# Patient Record
Sex: Female | Born: 1965 | Race: Black or African American | Hispanic: No | Smoking: Current every day smoker
Health system: Southern US, Community
[De-identification: ages and names within clinical notes are randomized; demographics above are authoritative.]

## PROBLEM LIST (undated history)

## (undated) VITALS — BP 120/80 | HR 77 | Temp 98.9°F | Resp 18

## (undated) DIAGNOSIS — K219 Gastro-esophageal reflux disease without esophagitis: Secondary | ICD-10-CM

## (undated) DIAGNOSIS — F3181 Bipolar II disorder: Secondary | ICD-10-CM

## (undated) DIAGNOSIS — D259 Leiomyoma of uterus, unspecified: Secondary | ICD-10-CM

## (undated) DIAGNOSIS — K5792 Diverticulitis of intestine, part unspecified, without perforation or abscess without bleeding: Secondary | ICD-10-CM

## (undated) DIAGNOSIS — E119 Type 2 diabetes mellitus without complications: Secondary | ICD-10-CM

## (undated) DIAGNOSIS — K5790 Diverticulosis of intestine, part unspecified, without perforation or abscess without bleeding: Secondary | ICD-10-CM

## (undated) DIAGNOSIS — F1910 Other psychoactive substance abuse, uncomplicated: Secondary | ICD-10-CM

## (undated) DIAGNOSIS — M5430 Sciatica, unspecified side: Secondary | ICD-10-CM

## (undated) HISTORY — PX: CHOLECYSTECTOMY: SHX55

## (undated) HISTORY — DX: Gastro-esophageal reflux disease without esophagitis: K21.9

## (undated) HISTORY — DX: Sciatica, unspecified side: M54.30

## (undated) HISTORY — PX: LUMBAR SPINE SURGERY: SHX701

## (undated) HISTORY — PX: BACK SURGERY: SHX140

---

## 2003-11-26 ENCOUNTER — Emergency Department: Payer: Self-pay | Admitting: Emergency Medicine

## 2003-11-26 ENCOUNTER — Other Ambulatory Visit: Payer: Self-pay

## 2004-05-10 ENCOUNTER — Emergency Department: Payer: Self-pay | Admitting: Emergency Medicine

## 2004-08-13 ENCOUNTER — Emergency Department: Payer: Self-pay | Admitting: Emergency Medicine

## 2005-02-14 ENCOUNTER — Emergency Department: Payer: Self-pay | Admitting: Emergency Medicine

## 2005-10-24 ENCOUNTER — Emergency Department: Payer: Self-pay | Admitting: Emergency Medicine

## 2006-01-14 ENCOUNTER — Emergency Department: Payer: Self-pay

## 2006-08-16 ENCOUNTER — Emergency Department: Payer: Self-pay | Admitting: Emergency Medicine

## 2007-05-25 ENCOUNTER — Emergency Department: Payer: Self-pay | Admitting: Emergency Medicine

## 2007-07-03 ENCOUNTER — Emergency Department: Payer: Self-pay | Admitting: Emergency Medicine

## 2007-09-20 ENCOUNTER — Other Ambulatory Visit: Payer: Self-pay

## 2007-09-20 ENCOUNTER — Emergency Department: Payer: Self-pay | Admitting: Emergency Medicine

## 2007-12-07 ENCOUNTER — Emergency Department: Payer: Self-pay | Admitting: Emergency Medicine

## 2008-03-05 ENCOUNTER — Emergency Department: Payer: Self-pay | Admitting: Emergency Medicine

## 2008-04-27 ENCOUNTER — Emergency Department: Payer: Self-pay | Admitting: Emergency Medicine

## 2008-07-12 ENCOUNTER — Emergency Department: Payer: Self-pay | Admitting: Internal Medicine

## 2008-12-20 ENCOUNTER — Inpatient Hospital Stay: Payer: Self-pay | Admitting: Internal Medicine

## 2009-05-01 ENCOUNTER — Emergency Department: Payer: Self-pay | Admitting: Emergency Medicine

## 2009-07-27 ENCOUNTER — Emergency Department: Payer: Self-pay | Admitting: Unknown Physician Specialty

## 2009-11-08 ENCOUNTER — Emergency Department: Payer: Self-pay | Admitting: Emergency Medicine

## 2009-12-10 ENCOUNTER — Emergency Department: Payer: Self-pay | Admitting: Emergency Medicine

## 2010-02-22 ENCOUNTER — Emergency Department: Payer: Self-pay | Admitting: Emergency Medicine

## 2010-02-24 ENCOUNTER — Emergency Department: Payer: Self-pay | Admitting: Emergency Medicine

## 2010-06-23 ENCOUNTER — Emergency Department: Payer: Self-pay | Admitting: Emergency Medicine

## 2010-07-23 ENCOUNTER — Emergency Department: Payer: Self-pay | Admitting: Emergency Medicine

## 2010-07-24 ENCOUNTER — Emergency Department: Payer: Self-pay | Admitting: Emergency Medicine

## 2010-07-25 ENCOUNTER — Emergency Department: Payer: Self-pay | Admitting: Emergency Medicine

## 2010-09-10 ENCOUNTER — Emergency Department: Payer: Self-pay | Admitting: Internal Medicine

## 2010-10-05 ENCOUNTER — Emergency Department: Payer: Self-pay | Admitting: Emergency Medicine

## 2011-04-21 ENCOUNTER — Emergency Department: Payer: Self-pay | Admitting: Emergency Medicine

## 2011-06-23 ENCOUNTER — Emergency Department: Payer: Self-pay | Admitting: Emergency Medicine

## 2011-06-23 LAB — WET PREP, GENITAL

## 2011-06-23 LAB — BASIC METABOLIC PANEL
Anion Gap: 8 (ref 7–16)
Chloride: 110 mmol/L — ABNORMAL HIGH (ref 98–107)
Co2: 22 mmol/L (ref 21–32)
Creatinine: 0.92 mg/dL (ref 0.60–1.30)
EGFR (African American): >60
EGFR (Non-African Amer.): >60
Glucose: 101 mg/dL — ABNORMAL HIGH (ref 65–99)
Potassium: 3.9 mmol/L (ref 3.5–5.1)

## 2011-06-23 LAB — URINALYSIS, COMPLETE
Leukocyte Esterase: NEGATIVE
Nitrite: NEGATIVE
Ph: 5 (ref 4.5–8.0)
Protein: NEGATIVE
RBC,UR: 2 /HPF (ref 0–5)
Specific Gravity: 1.028 (ref 1.003–1.030)

## 2011-06-23 LAB — PREGNANCY, URINE: Pregnancy Test, Urine: NEGATIVE m[IU]/mL

## 2011-06-23 LAB — CBC
HGB: 12.8 g/dL (ref 12.0–16.0)
MCHC: 33.2 g/dL (ref 32.0–36.0)
MCV: 87 fL (ref 80–100)
Platelet: 241 10*3/uL (ref 150–440)
RDW: 14.3 % (ref 11.5–14.5)

## 2011-10-09 ENCOUNTER — Observation Stay: Payer: Self-pay | Admitting: Internal Medicine

## 2011-10-09 LAB — CK TOTAL AND CKMB (NOT AT ARMC)
CK, Total: 212 U/L (ref 21–215)
CK-MB: 2 ng/mL (ref 0.5–3.6)
CK-MB: 1.8 ng/mL (ref 0.5–3.6)

## 2011-10-09 LAB — CBC
HCT: 38.1 % (ref 35.0–47.0)
HGB: 12.9 g/dL (ref 12.0–16.0)
MCV: 87 fL (ref 80–100)
Platelet: 219 10*3/uL (ref 150–440)
RBC: 4.38 10*6/uL (ref 3.80–5.20)
WBC: 10.8 10*3/uL (ref 3.6–11.0)

## 2011-10-09 LAB — COMPREHENSIVE METABOLIC PANEL
Albumin: 3.1 g/dL — ABNORMAL LOW (ref 3.4–5.0)
Alkaline Phosphatase: 89 U/L (ref 50–136)
BUN: 14 mg/dL (ref 7–18)
Bilirubin,Total: 0.2 mg/dL (ref 0.2–1.0)
Calcium, Total: 8.2 mg/dL — ABNORMAL LOW (ref 8.5–10.1)
Co2: 23 mmol/L (ref 21–32)
EGFR (Non-African Amer.): >60
Glucose: 193 mg/dL — ABNORMAL HIGH (ref 65–99)
Osmolality: 281 (ref 275–301)
Potassium: 3.8 mmol/L (ref 3.5–5.1)
Sodium: 138 mmol/L (ref 136–145)
Total Protein: 6.9 g/dL (ref 6.4–8.2)

## 2011-10-09 LAB — PROTIME-INR
INR: 0.9
Prothrombin Time: 13 secs (ref 11.5–14.7)

## 2011-10-09 LAB — LIPID PANEL
HDL Cholesterol: 46 mg/dL (ref 40–60)
Triglycerides: 248 mg/dL — ABNORMAL HIGH (ref 0–200)

## 2011-10-09 LAB — TSH: Thyroid Stimulating Horm: 1.36 u[IU]/mL

## 2011-10-09 LAB — HEMOGLOBIN A1C: Hemoglobin A1C: 6.9 % — ABNORMAL HIGH (ref 4.2–6.3)

## 2011-10-10 DIAGNOSIS — R079 Chest pain, unspecified: Secondary | ICD-10-CM

## 2011-10-10 LAB — CK TOTAL AND CKMB (NOT AT ARMC): CK, Total: 170 U/L (ref 21–215)

## 2011-11-14 ENCOUNTER — Emergency Department: Payer: Self-pay | Admitting: Emergency Medicine

## 2011-11-14 LAB — BASIC METABOLIC PANEL
Anion Gap: 9 (ref 7–16)
BUN: 14 mg/dL (ref 7–18)
Chloride: 111 mmol/L — ABNORMAL HIGH (ref 98–107)
Creatinine: 0.97 mg/dL (ref 0.60–1.30)
Glucose: 93 mg/dL (ref 65–99)
Osmolality: 285 (ref 275–301)
Sodium: 143 mmol/L (ref 136–145)

## 2011-11-14 LAB — CBC
HCT: 38.8 % (ref 35.0–47.0)
HGB: 13.5 g/dL (ref 12.0–16.0)
MCH: 30.1 pg (ref 26.0–34.0)
MCV: 86 fL (ref 80–100)
RBC: 4.5 10*6/uL (ref 3.80–5.20)
RDW: 14.2 % (ref 11.5–14.5)

## 2011-11-14 LAB — CK TOTAL AND CKMB (NOT AT ARMC)
CK, Total: 238 U/L — ABNORMAL HIGH (ref 21–215)
CK-MB: 1.7 ng/mL (ref 0.5–3.6)

## 2011-11-20 ENCOUNTER — Emergency Department: Payer: Self-pay | Admitting: Emergency Medicine

## 2011-11-20 LAB — URINALYSIS, COMPLETE
Bacteria: NONE SEEN
Bilirubin,UR: NEGATIVE
Glucose,UR: NEGATIVE mg/dL (ref 0–75)
Ketone: NEGATIVE
Ph: 7 (ref 4.5–8.0)
Protein: NEGATIVE
RBC,UR: <1 /HPF (ref 0–5)
Specific Gravity: 1.008 (ref 1.003–1.030)
Squamous Epithelial: 2
WBC UR: <1 /HPF (ref 0–5)

## 2011-12-18 ENCOUNTER — Emergency Department: Payer: Self-pay | Admitting: Unknown Physician Specialty

## 2011-12-18 LAB — COMPREHENSIVE METABOLIC PANEL
Albumin: 3.4 g/dL (ref 3.4–5.0)
Chloride: 108 mmol/L — ABNORMAL HIGH (ref 98–107)
Co2: 22 mmol/L (ref 21–32)
EGFR (African American): >60
Osmolality: 279 (ref 275–301)
SGOT(AST): 12 U/L — ABNORMAL LOW (ref 15–37)
SGPT (ALT): 20 U/L (ref 12–78)

## 2011-12-18 LAB — CBC
HCT: 40.4 % (ref 35.0–47.0)
MCHC: 33 g/dL (ref 32.0–36.0)
Platelet: 267 10*3/uL (ref 150–440)
RDW: 14.6 % — ABNORMAL HIGH (ref 11.5–14.5)
WBC: 13.8 10*3/uL — ABNORMAL HIGH (ref 3.6–11.0)

## 2011-12-18 LAB — MAGNESIUM: Magnesium: 2.2 mg/dL

## 2012-01-02 ENCOUNTER — Emergency Department: Payer: Self-pay | Admitting: Emergency Medicine

## 2012-01-02 LAB — COMPREHENSIVE METABOLIC PANEL
Albumin: 3.3 g/dL — ABNORMAL LOW (ref 3.4–5.0)
BUN: 11 mg/dL (ref 7–18)
Bilirubin,Total: 0.3 mg/dL (ref 0.2–1.0)
Creatinine: 0.74 mg/dL (ref 0.60–1.30)
Glucose: 135 mg/dL — ABNORMAL HIGH (ref 65–99)
Osmolality: 277 (ref 275–301)
Potassium: 3.8 mmol/L (ref 3.5–5.1)
SGOT(AST): 12 U/L — ABNORMAL LOW (ref 15–37)
Sodium: 138 mmol/L (ref 136–145)
Total Protein: 7.4 g/dL (ref 6.4–8.2)

## 2012-01-02 LAB — CK TOTAL AND CKMB (NOT AT ARMC)
CK, Total: 211 U/L (ref 21–215)
CK-MB: 1.7 ng/mL (ref 0.5–3.6)

## 2012-01-02 LAB — URINALYSIS, COMPLETE
Bilirubin,UR: NEGATIVE
Glucose,UR: NEGATIVE mg/dL (ref 0–75)
Ketone: NEGATIVE
Ph: 5 (ref 4.5–8.0)
Protein: NEGATIVE
RBC,UR: 1 /HPF (ref 0–5)

## 2012-01-02 LAB — CBC
HCT: 38 % (ref 35.0–47.0)
MCHC: 33.5 g/dL (ref 32.0–36.0)
MCV: 86 fL (ref 80–100)
RDW: 14.7 % — ABNORMAL HIGH (ref 11.5–14.5)

## 2012-04-19 ENCOUNTER — Emergency Department: Payer: Self-pay | Admitting: Emergency Medicine

## 2012-04-19 LAB — CBC
HCT: 36.1 % (ref 35.0–47.0)
MCHC: 32.9 g/dL (ref 32.0–36.0)
Platelet: 253 10*3/uL (ref 150–440)
RBC: 4.15 10*6/uL (ref 3.80–5.20)
WBC: 11.8 10*3/uL — ABNORMAL HIGH (ref 3.6–11.0)

## 2012-04-19 LAB — WET PREP, GENITAL

## 2012-04-19 LAB — COMPREHENSIVE METABOLIC PANEL
Alkaline Phosphatase: 82 U/L (ref 50–136)
Anion Gap: 7 (ref 7–16)
Bilirubin,Total: 0.1 mg/dL — ABNORMAL LOW (ref 0.2–1.0)
Calcium, Total: 8.2 mg/dL — ABNORMAL LOW (ref 8.5–10.1)
Chloride: 110 mmol/L — ABNORMAL HIGH (ref 98–107)
EGFR (African American): >60
Glucose: 117 mg/dL — ABNORMAL HIGH (ref 65–99)
Osmolality: 280 (ref 275–301)
SGOT(AST): 21 U/L (ref 15–37)
SGPT (ALT): 21 U/L (ref 12–78)
Sodium: 139 mmol/L (ref 136–145)
Total Protein: 6.6 g/dL (ref 6.4–8.2)

## 2012-04-19 LAB — URINALYSIS, COMPLETE
Bacteria: NONE SEEN
Bilirubin,UR: NEGATIVE
Ketone: NEGATIVE
Nitrite: NEGATIVE
Ph: 5 (ref 4.5–8.0)
Protein: NEGATIVE
RBC,UR: 1 /HPF (ref 0–5)
Specific Gravity: 1.023 (ref 1.003–1.030)
Squamous Epithelial: 1

## 2012-06-04 ENCOUNTER — Emergency Department: Payer: Self-pay | Admitting: Emergency Medicine

## 2012-06-10 ENCOUNTER — Emergency Department: Payer: Self-pay | Admitting: Emergency Medicine

## 2012-06-10 LAB — COMPREHENSIVE METABOLIC PANEL
Albumin: 3.2 g/dL — ABNORMAL LOW (ref 3.4–5.0)
Alkaline Phosphatase: 97 U/L (ref 50–136)
Anion Gap: 4 — ABNORMAL LOW (ref 7–16)
BUN: 15 mg/dL (ref 7–18)
Bilirubin,Total: 0.1 mg/dL — ABNORMAL LOW (ref 0.2–1.0)
Calcium, Total: 8.8 mg/dL (ref 8.5–10.1)
Creatinine: 0.89 mg/dL (ref 0.60–1.30)
EGFR (Non-African Amer.): >60
Osmolality: 279 (ref 275–301)
SGOT(AST): 21 U/L (ref 15–37)
SGPT (ALT): 21 U/L (ref 12–78)

## 2012-06-10 LAB — URINALYSIS, COMPLETE
Bilirubin,UR: NEGATIVE
Glucose,UR: NEGATIVE mg/dL (ref 0–75)
Ketone: NEGATIVE
Nitrite: NEGATIVE
RBC,UR: 3 /HPF (ref 0–5)
Squamous Epithelial: 9
WBC UR: 3 /HPF (ref 0–5)

## 2012-06-10 LAB — CBC
HGB: 12.9 g/dL (ref 12.0–16.0)
MCH: 28.8 pg (ref 26.0–34.0)
MCHC: 33.9 g/dL (ref 32.0–36.0)
Platelet: 251 10*3/uL (ref 150–440)

## 2012-06-10 LAB — LIPASE, BLOOD: Lipase: 138 U/L (ref 73–393)

## 2012-07-18 ENCOUNTER — Emergency Department: Payer: Self-pay | Admitting: Emergency Medicine

## 2013-02-13 ENCOUNTER — Emergency Department: Payer: Self-pay | Admitting: Internal Medicine

## 2013-02-13 LAB — RAPID INFLUENZA A&B ANTIGENS (ARMC ONLY)

## 2013-02-27 ENCOUNTER — Emergency Department: Payer: Self-pay | Admitting: Emergency Medicine

## 2013-03-04 ENCOUNTER — Emergency Department: Payer: Self-pay | Admitting: Emergency Medicine

## 2013-05-18 ENCOUNTER — Emergency Department: Payer: Self-pay | Admitting: Emergency Medicine

## 2013-07-18 ENCOUNTER — Emergency Department: Payer: Self-pay | Admitting: Emergency Medicine

## 2013-07-18 LAB — CBC
HCT: 45.7 % (ref 35.0–47.0)
HGB: 14.6 g/dL (ref 12.0–16.0)
MCH: 28.5 pg (ref 26.0–34.0)
MCHC: 31.9 g/dL — ABNORMAL LOW (ref 32.0–36.0)
MCV: 89 fL (ref 80–100)
Platelet: 217 10*3/uL (ref 150–440)
RBC: 5.12 10*6/uL (ref 3.80–5.20)
RDW: 13.8 % (ref 11.5–14.5)
WBC: 12 10*3/uL — ABNORMAL HIGH (ref 3.6–11.0)

## 2013-07-18 LAB — URINALYSIS, COMPLETE
BILIRUBIN, UR: NEGATIVE
KETONE: NEGATIVE
Leukocyte Esterase: NEGATIVE
Nitrite: NEGATIVE
PH: 5 (ref 4.5–8.0)
PROTEIN: NEGATIVE
Specific Gravity: 1.033 (ref 1.003–1.030)

## 2013-07-18 LAB — COMPREHENSIVE METABOLIC PANEL
ALBUMIN: 3.3 g/dL — AB (ref 3.4–5.0)
ALK PHOS: 165 U/L — AB
ANION GAP: 7 (ref 7–16)
AST: 27 U/L (ref 15–37)
BILIRUBIN TOTAL: 0.3 mg/dL (ref 0.2–1.0)
BUN: 12 mg/dL (ref 7–18)
CALCIUM: 9.9 mg/dL (ref 8.5–10.1)
CO2: 22 mmol/L (ref 21–32)
Chloride: 100 mmol/L (ref 98–107)
Creatinine: 0.68 mg/dL (ref 0.60–1.30)
EGFR (Non-African Amer.): >60
GLUCOSE: 454 mg/dL — AB (ref 65–99)
Osmolality: 278 (ref 275–301)
POTASSIUM: 4.7 mmol/L (ref 3.5–5.1)
SGPT (ALT): 33 U/L (ref 12–78)
Sodium: 129 mmol/L — ABNORMAL LOW (ref 136–145)
Total Protein: 7.3 g/dL (ref 6.4–8.2)

## 2013-08-30 ENCOUNTER — Emergency Department: Payer: Self-pay | Admitting: Emergency Medicine

## 2013-10-21 ENCOUNTER — Emergency Department: Payer: Self-pay | Admitting: Internal Medicine

## 2013-10-21 LAB — WET PREP, GENITAL

## 2013-10-21 LAB — URINALYSIS, COMPLETE
BACTERIA: NONE SEEN
Bilirubin,UR: NEGATIVE
Glucose,UR: 50 mg/dL (ref 0–75)
Hyaline Cast: 1
KETONE: NEGATIVE
Nitrite: NEGATIVE
PH: 5 (ref 4.5–8.0)
PROTEIN: NEGATIVE
RBC,UR: 26 /HPF (ref 0–5)
Specific Gravity: 1.023 (ref 1.003–1.030)
Squamous Epithelial: 1

## 2013-10-21 LAB — GC/CHLAMYDIA PROBE AMP

## 2014-02-17 ENCOUNTER — Emergency Department: Payer: Self-pay | Admitting: Emergency Medicine

## 2014-02-17 LAB — CBC
HCT: 39.4 % (ref 35.0–47.0)
HGB: 13.2 g/dL (ref 12.0–16.0)
MCH: 29.7 pg (ref 26.0–34.0)
MCHC: 33.6 g/dL (ref 32.0–36.0)
MCV: 88 fL (ref 80–100)
Platelet: 232 10*3/uL (ref 150–440)
RBC: 4.46 10*6/uL (ref 3.80–5.20)
RDW: 13.4 % (ref 11.5–14.5)
WBC: 11.9 10*3/uL — ABNORMAL HIGH (ref 3.6–11.0)

## 2014-02-17 LAB — BASIC METABOLIC PANEL
ANION GAP: 9 (ref 7–16)
BUN: 13 mg/dL (ref 7–18)
CALCIUM: 8.3 mg/dL — AB (ref 8.5–10.1)
CHLORIDE: 107 mmol/L (ref 98–107)
CO2: 24 mmol/L (ref 21–32)
CREATININE: 0.96 mg/dL (ref 0.60–1.30)
EGFR (African American): >60
EGFR (Non-African Amer.): >60
Glucose: 315 mg/dL — ABNORMAL HIGH (ref 65–99)
Osmolality: 292 (ref 275–301)
Potassium: 3.5 mmol/L (ref 3.5–5.1)
Sodium: 140 mmol/L (ref 136–145)

## 2014-02-17 LAB — TROPONIN I

## 2014-02-18 LAB — TROPONIN I: Troponin-I: <0.02 ng/mL

## 2014-02-18 LAB — D-DIMER(ARMC): D-Dimer: 238 ng/ml

## 2014-05-30 NOTE — Consult Note (Signed)
Brief Consult Note: Diagnosis: bipolar disorder II.   Patient was seen by consultant.   Consult note dictated.   Recommend further assessment or treatment.   Orders entered.   Comments: Psychiatry: Patient seen. Chart reviewed. Patient recently more emotional and tearful. Wants to restart tegretol 150mg  bid and trazodone 50mg  qhs - previously helpful. Orders done. Patient should be referred back to Dr Kasandra Knudsen for treatment post discharge. Will follow up.  Electronic Signatures: Gonzella Lex (MD)  (Signed 29-Aug-13 15:50)  Authored: Brief Consult Note   Last Updated: 29-Aug-13 15:50 by Gonzella Lex (MD)

## 2014-05-30 NOTE — Consult Note (Signed)
Details:    - Psychiatry: Patient seen. She is feeling better. Mood better and smiling. Tolerating meds well. I explained that the tegretol 300mg  XR shouild be the equivilent of 150mg  bid plain and our pharmacy doesn't have 150mg .   I suggest to her to either followup with Dr Kasandra Knudsen, her prior psychiatrist, or with [facility] services. I gave her their phone number at [phone]. She agrees to seek outpatient followup.   Electronic Signatures: Gonzella Lex (MD)  (Signed 30-Aug-13 17:08)  Authored: Details   Last Updated: 30-Aug-13 17:08 by Gonzella Lex (MD)

## 2014-05-30 NOTE — Discharge Summary (Signed)
PATIENT NAME:  Julie Lambert, Julie Lambert MR#:  [id number] DATE OF BIRTH:  January 09, 1966  DATE OF ADMISSION:  10/09/2011 DATE OF DISCHARGE:  10/10/2011  DISCHARGE DIAGNOSES:  1. Atypical chest pain, likely noncardiac, possibly related to anxiety and depression. 2. Arm pain felt to be musculoskeletal.  3. Hyperlipidemia.  4. Possible new onset diabetes. 5. Smoking. 6. Bipolar disorder/depression.   CONSULTANT: Alethia Berthold, MD - Psychiatry.  RESULTS: Inpatient stress test showed no evidence of ischemia.   Chest x-ray showed no acute abnormality.   Shoulder x-ray, left, showed no acute abnormality.   CBC normal. Glucose 193. Hemoglobin A1c 6.9. VLDL 50, LDL 79, triglycerides 248, and HDL 46.   HOSPITAL COURSE: The patient is a 49 year old female with past medical history of anxiety and depression who has not been taking her medications due to financial reasons. She presented with very atypical chest pain and left shoulder pain. She was admitted to the hospital and ruled out with three sets of negative cardiac enzymes. Her TSH was normal. She underwent an inpatient stress test which showed no evidence of any ischemia. Her arm pain was felt to be musculoskeletal. Chest and shoulder x-rays were negative. Fasting lipid profile was checked and the patient has elevated LDL and triglycerides. She has been advised about diet and lifestyle modifications. We will defer initiation of therapy to her PCP. She had an elevated serum glucose close to 200 therefore Hemoglobin A1c was checked and was found to be 6.9. The patient is at high risk. The patient likely has new onset diabetes and is obese. Therefore, she is being started on metformin. She will receive inpatient diabetic teaching prior to discharge. She has been counseled about smoking cessation. A psychiatric consultation with Dr. Alethia Berthold was obtained and the patient has been restarted on Trazodone and Tegretol. She has been given a prescription for all her  medications. She is being discharged home in a stable condition.   TIME SPENT: 45 minutes.  ____________________________ Cherre Huger, MD sp:slb D: 10/10/2011 15:58:30 ET T: 10/11/2011 12:01:19 ET JOB#: [id number]  cc: Cherre Huger, MD, <Dictator> Cherre Huger MD ELECTRONICALLY SIGNED 10/18/2011 6:53

## 2014-05-30 NOTE — H&P (Signed)
PATIENT NAME:  Julie Lambert, BIERNAT MR#:  [id number] DATE OF BIRTH:  March 20, 1965  DATE OF ADMISSION:  10/09/2011  PRIMARY CARE PHYSICIAN: None.   CHIEF COMPLAINT: Extreme chest pain and left arm pain.   HISTORY OF PRESENT ILLNESS: This is a 49 year old female with a history of cocaine abuse and tobacco abuse who presents with the above complaint. The patient says for the past one year she has had on and off left arm pain. She denies any trauma or strenuous activity to the left arm, however, over the past few weeks it's just gotten a lot worse where she's unable to move her arm. On further questioning she also says that this morning at 3 a.m. she developed sudden onset of extreme chest pain, 10 out of 10, without any radiation. No shortness of breath, diaphoresis, nausea, or vomiting. It lasted about 30 minutes. She has had on and off chest pain for the past two weeks. She said it is aggravated when she is angry or stressed out and relieved when she is calm. She also admits to being depressed over several months. She has been off of her bipolar affective disorder medications over one year. She denies any suicidal or homicidal ideations.    REVIEW OF SYSTEMS: CONSTITUTIONAL: No fever. Positive fatigue and weakness. Positive arm pain and chest pain. No weight loss or weight gain. EYES: No blurred or double vision, glaucoma or cataracts. ENT: No hearing loss, ear pain, seasonal allergies, or postnasal drip. RESPIRATORY: No cough, wheezing, hemoptysis, dyspnea, or COPD. CARDIOVASCULAR: Chest pain as mentioned above. No palpitations, orthopnea, syncope, arrhythmia, or dyspnea on exertion. GI: Denies any nausea, vomiting, diarrhea, abdominal pain, melena, or ulcers. GU: No dysuria or hematuria. ENDOCRINE: No polyuria or polydipsia. HEME/LYMPH: No anemia or easy bruising. SKIN: No rash or lesions. MUSCULOSKELETAL: She has shoulder and left arm pain. No limited activity, arthritis, or cramps. NEUROLOGIC: No history of  CVA, TIA, or seizures. PSYCH: No history of anxiety. She does have bipolar disorder type II.   PAST MEDICAL HISTORY: 1. Bipolar disorder type II.  2. Fibroid tumors.    PAST SURGICAL HISTORY:  1. Cholecystectomy.   2. Back surgery.   MEDICATIONS: None.   ALLERGIES: No known drug allergies.   SOCIAL HISTORY: The patient is a former cocaine addict. She smoked 1 pack every two days. Occasional alcohol use.   FAMILY HISTORY: Positive for coronary artery disease, TIA, and diabetes.   PHYSICAL EXAMINATION:   VITAL SIGNS: The patient is afebrile. Temperature 97.9, pulse 78, respirations 18, blood pressure 163/87, 100% on room air.   GENERAL: The patient is alert, oriented. She seems tearful.   HEENT: Head is atraumatic. Pupils are round. Sclerae anicteric. Mucous membranes are moist. Oropharynx is clear.   NECK: Supple without JVD, carotid bruit, or enlarged thyroid.   CARDIOVASCULAR: Regular rate and rhythm. No murmurs, gallops, or rubs. She has tenderness to palpation of the chest wall. PMI is not displaced.   LUNGS: Clear to auscultation bilaterally without crackles, rales, rhonchi, or wheezing. Normal percussion.  BACK: No CVA or vertebral tenderness.   ABDOMEN: Obese. Bowel sounds are positive. Nontender. Nondistended. Hard to appreciate organomegaly due to body habitus.   EXTREMITIES: No clubbing, cyanosis, or edema.   NEUROLOGIC: Cranial nerves II through XII are intact. No focal deficits.   SKIN: Without rash or lesions.   LABORATORY, DIAGNOSTIC, AND RADIOLOGICAL DATA: Troponin less than 0.02. CK 234. CPK-MB 2.0. INR 0.9. White blood cells 10.8, hemoglobin 13, hematocrit 38.1, platelets  219, sodium 138, potassium 3.8, chloride 108, bicarb 23, BUN 14, creatinine 1.08, glucose 193, bilirubin 0.2, alkaline phosphatase 89, ALT 19, AST 20, total protein 6.9, albumin 3.1.   Chest x-ray shows no acute cardiopulmonary disease.   EKG shows normal sinus rhythm. No ST elevation  or depression.   ASSESSMENT AND PLAN: This is a 49 year old female who presents with extreme 10 out of 10 left arm pain and chest pain. As far as her arm pain, it has been on and off for the past year and her chest pain for the past two weeks.  1. Chest pain. The patient has tenderness to palpation. It is unlikely this is due to coronary syndrome, however she will be admitted to the hospital for observation given her family history and smoking history to be ruled out for acute coronary syndrome. Troponins will be followed. The patient will be placed on telemetry. She will undergo a Myoview in the a.m. I started her on aspirin. Will check fasting lipids. Have written for p.r.n. nitroglycerin.  2. Arm pain, seems musculoskeletal in nature as well. I've written for some NSAIDs and an x-ray of her shoulder. Further work-up pending x-ray.  3. Hyperglycemia. Will go ahead and check a hemoglobin A1c.  4. Tobacco dependence. I encouraged the patient to stop smoking. She is on nicotine patch. The patient was counseled for three minutes. She is interested in quitting smoking.  5. Bipolar disorder with depression. The patient denies any suicidal or homicidal ideation. Will consult Psychiatry.  6. Elevated blood pressure. Will monitor closely on telemetry.   CODE STATUS: The patient is FULL CODE status.   The patient will need primary care physician prior to discharge.   TIME SPENT: Approximately 40 minutes.   ____________________________ Donell Beers. Benjie Karvonen, MD spm:drc D: 10/09/2011 13:57:31 ET T: 10/09/2011 15:16:44 ET JOB#: [id number] Esmeralda Blanford P Nicklaus Alviar MD ELECTRONICALLY SIGNED 10/09/2011 16:12

## 2014-05-30 NOTE — Consult Note (Signed)
PATIENT NAME:  Julie Lambert, DONOVAN MR#:  [id number] DATE OF BIRTH:  12-17-1965  DATE OF CONSULTATION:  10/09/2011  REFERRING PHYSICIAN:   CONSULTING PHYSICIAN:  Gonzella Lex, MD  IDENTIFYING INFORMATION AND REASON FOR CONSULT: This is a 49 year old woman admitted to the hospital with shoulder and chest pain. Consult is for evaluation of "bipolar II".    HISTORY OF PRESENT ILLNESS: Information obtained from the patient and the chart. The patient states that recently her mood has been more tearful, moody, and irritable. She describes herself as being "up and down". She feels angry and has trouble controlling her temper. She has not been sleeping well at night, feels more tired out during the day. She feels overwhelmed by the stress in her life which is primarily related to having to live with her elderly mother and take care of her while still working. The patient has been off of all psychiatric medicine for probably over a year. She feels that in the past the medicine she took for her diagnosis of bipolar disorder type II was helpful in controlling her mood swings. She also reports that she has relapsed into substance abuse. She used crack cocaine this past Sunday. That was the first time she had done so in a couple of months. Previously she had had several years of clean time. She denies that she is abusing other drugs.   PAST PSYCHIATRIC HISTORY: The patient states that she has been diagnosed with bipolar disorder type II. No history of psychiatric hospitalizations. No history of suicide attempts. She used to be treated by Dr. Kasandra Knudsen at [facility] in [place]. Prior medicines were Tegretol 300 mg a day and trazodone 500 mg at night. She does not recall any other medicines that she took specifically for mood stabilization.   SOCIAL HISTORY: The patient is living with her elderly mother and her own son. 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 She finds it very  stressful living with her mother. She resents the fact that she has to live in [facility] when she would prefer to be living somewhere else. The patient has a female friend but is not in a serious committed relationship. Her relationship with her son is adequate. She does not report any current physical abuse.   PAST MEDICAL HISTORY: She presented to the hospital with pain in her shoulder and chest. There was concern about the possibility of cardiac pain. She has a history of fibroid tumors in the past. Has had a cholecystectomy previously.   MEDICATIONS: She is not currently taking any medications.   ALLERGIES: She reports no known allergies.   REVIEW OF SYSTEMS: As stated above, she complains of irritable mood, tearfulness, mood swings, poor sleep, fatigue. No suicidal ideation.   MENTAL STATUS EXAMINATION: Cooperative woman interviewed in her hospital room. Alert and oriented and awake. Makes good eye contact. Normal psychomotor activity. Speech is normal in rate, tone, and volume. Affect is euthymic, somewhat sad. No hostility evident. Thoughts are lucid with no loosening of associations or thought disorder evident. No evident delusions. Denies hallucinations. Denies any suicidal or homicidal ideation. Appears to be of at least average intelligence. Judgment and insight generally intact. Short and long-term memory grossly intact.   ASSESSMENT: This is a 49 year old woman with a history of diagnosis of bipolar disorder type II. The history she is giving would fit that. She reports recurrent depression with irritability without full blown mania or psychosis. Additionally, she has a substance  dependence problem with cocaine that had previously been in remission but she has relapsed. She is seeking to get back on medications for mood stabilization. She already is involved in active outpatient substance abuse treatment with regular attendance at Narcotics Anonymous meetings.   TREATMENT PLAN:   1. Restart Tegretol 300 mg extended-release per day and trazodone 50 mg at night.  2. Supportive therapy done.  3. Suggested to the patient that she needs to contact her old psychiatrist and follow-up for further treatment including therapy in the future.  4. Will follow-up in the hospital as she stays here.   The patient is agreeable to the plan.   DIAGNOSES PRINCIPLE AND PRIMARY:  AXIS I: Bipolar disorder, type II.   SECONDARY DIAGNOSES:  AXIS I: Cocaine dependence.   AXIS II: Deferred.   AXIS III: Acute chest and shoulder pain of unclear etiology.   AXIS IV: Moderate. Chronic stress from taking care of her mother, dissatisfaction with her life.   AXIS V: Functioning at time of evaluation 31.   ____________________________ Gonzella Lex, MD jtc:drc D: 10/09/2011 22:55:29 ET T: 10/10/2011 08:18:30 ET JOB#: [id number]  cc: Gonzella Lex, MD, <Dictator> Gonzella Lex MD ELECTRONICALLY SIGNED 10/10/2011 14:30

## 2014-06-10 ENCOUNTER — Emergency Department
Admit: 2014-06-10 | Discharge: 2014-06-11 | Disposition: A | Discharge status: Home / Self Care | Payer: Self-pay | Source: Ambulatory Visit | Attending: Emergency Medicine | Admitting: Emergency Medicine

## 2014-06-10 DIAGNOSIS — N760 Acute vaginitis: Secondary | ICD-10-CM | POA: Insufficient documentation

## 2014-06-10 DIAGNOSIS — E119 Type 2 diabetes mellitus without complications: Secondary | ICD-10-CM | POA: Insufficient documentation

## 2014-06-10 DIAGNOSIS — R1031 Right lower quadrant pain: Secondary | ICD-10-CM

## 2014-06-10 DIAGNOSIS — F329 Major depressive disorder, single episode, unspecified: Secondary | ICD-10-CM | POA: Insufficient documentation

## 2014-06-10 DIAGNOSIS — R112 Nausea with vomiting, unspecified: Secondary | ICD-10-CM | POA: Insufficient documentation

## 2014-06-10 DIAGNOSIS — B9689 Other specified bacterial agents as the cause of diseases classified elsewhere: Secondary | ICD-10-CM

## 2014-06-10 DIAGNOSIS — Z720 Tobacco use: Secondary | ICD-10-CM | POA: Insufficient documentation

## 2014-06-10 HISTORY — DX: Type 2 diabetes mellitus without complications: E11.9

## 2014-06-10 LAB — CBC WITH DIFFERENTIAL/PLATELET
BASOS PCT: 0.7 %
Basophil #: 0.1 10*3/uL (ref 0.0–0.1)
EOS ABS: 0.1 10*3/uL (ref 0.0–0.7)
Eosinophil %: 0.9 %
HCT: 39.1 % (ref 35.0–47.0)
HGB: 13.4 g/dL (ref 12.0–16.0)
Lymphocyte #: 3.1 10*3/uL (ref 1.0–3.6)
Lymphocyte %: 23.9 %
MCH: 30.2 pg (ref 26.0–34.0)
MCHC: 34.4 g/dL (ref 32.0–36.0)
MCV: 88 fL (ref 80–100)
MONO ABS: 0.9 x10 3/mm (ref 0.2–0.9)
Monocyte %: 6.8 %
NEUTROS PCT: 67.7 %
Neutrophil #: 8.9 10*3/uL — ABNORMAL HIGH (ref 1.4–6.5)
Platelet: 177 10*3/uL (ref 150–440)
RBC: 4.45 10*6/uL (ref 3.80–5.20)
RDW: 13.5 % (ref 11.5–14.5)
WBC: 13.1 10*3/uL — ABNORMAL HIGH (ref 3.6–11.0)

## 2014-06-10 LAB — COMPREHENSIVE METABOLIC PANEL
Albumin: 3.6 g/dL
Alkaline Phosphatase: 161 U/L — ABNORMAL HIGH
Anion Gap: 10 (ref 7–16)
BUN: 13 mg/dL
Bilirubin,Total: 0.5 mg/dL
CALCIUM: 9.1 mg/dL
CHLORIDE: 100 mmol/L — AB
CO2: 21 mmol/L — AB
Creatinine: 0.8 mg/dL
EGFR (African American): >60
EGFR (Non-African Amer.): >60
Glucose: 497 mg/dL — ABNORMAL HIGH
Potassium: 3.7 mmol/L
SGOT(AST): 17 U/L
SGPT (ALT): 19 U/L
Sodium: 131 mmol/L — ABNORMAL LOW
Total Protein: 6.8 g/dL

## 2014-06-10 LAB — URINALYSIS, COMPLETE
Bacteria: NONE SEEN
Bilirubin,UR: NEGATIVE
Glucose,UR: >=500 mg/dL (ref 0–75)
LEUKOCYTE ESTERASE: NEGATIVE
NITRITE: NEGATIVE
Ph: 5 (ref 4.5–8.0)
Protein: NEGATIVE
Specific Gravity: 1.032 (ref 1.003–1.030)

## 2014-06-10 LAB — LIPASE, BLOOD: Lipase: 36 U/L

## 2014-06-10 LAB — TROPONIN I: Troponin-I: <0.03 ng/mL

## 2014-06-11 ENCOUNTER — Emergency Department: Payer: Self-pay

## 2014-06-11 ENCOUNTER — Encounter: Payer: Self-pay | Admitting: Emergency Medicine

## 2014-06-11 LAB — CHLAMYDIA/NGC RT PCR (ARMC ONLY)
CHLAMYDIA TR: NOT DETECTED
N gonorrhoeae: NOT DETECTED

## 2014-06-11 LAB — WET PREP, GENITAL
TRICH WET PREP: NONE SEEN
WBC, Wet Prep HPF POC: NONE SEEN

## 2014-06-11 LAB — GLUCOSE, CAPILLARY: Glucose-Capillary: 321 mg/dL — ABNORMAL HIGH (ref 70–99)

## 2014-06-11 MED ORDER — TRAMADOL HCL 50 MG PO TABS: 50.0000 mg | ORAL_TABLET | Freq: Four times a day (QID) | ORAL | Status: AC | PRN

## 2014-06-11 MED ORDER — IOHEXOL 300 MG/ML  SOLN
100.0000 mL | Freq: Once | INTRAMUSCULAR | Status: AC | PRN
  Administered 2014-06-11: 100 mL via INTRAVENOUS

## 2014-06-11 MED ORDER — METRONIDAZOLE 500 MG PO TABS
ORAL_TABLET | ORAL | Status: AC
  Filled 2014-06-11: qty 1

## 2014-06-11 MED ORDER — METRONIDAZOLE 500 MG PO TABS
500.0000 mg | ORAL_TABLET | Freq: Once | ORAL | Status: AC
  Administered 2014-06-11: 500 mg via ORAL

## 2014-06-11 MED ORDER — METRONIDAZOLE 500 MG PO TABS: 500.0000 mg | ORAL_TABLET | Freq: Two times a day (BID) | ORAL | Status: AC

## 2014-06-11 MED ORDER — IOHEXOL 240 MG/ML SOLN
25.0000 mL | Freq: Once | INTRAMUSCULAR | Status: AC | PRN
  Administered 2014-06-11: 25 mL via ORAL

## 2014-06-11 MED ORDER — MORPHINE SULFATE 4 MG/ML IJ SOLN
4.0000 mg | Freq: Once | INTRAMUSCULAR | Status: AC
  Administered 2014-06-11: 4 mg via INTRAVENOUS

## 2014-06-11 MED ORDER — OXYCODONE-ACETAMINOPHEN 5-325 MG PO TABS
1.0000 | ORAL_TABLET | Freq: Once | ORAL | Status: AC
  Administered 2014-06-11: 1 via ORAL

## 2014-06-11 MED ORDER — OXYCODONE-ACETAMINOPHEN 5-325 MG PO TABS
ORAL_TABLET | ORAL | Status: AC
  Administered 2014-06-11: 1 via ORAL
  Filled 2014-06-11: qty 1

## 2014-06-11 NOTE — ED Notes (Signed)
Vital signs stable. 

## 2014-06-11 NOTE — ED Notes (Signed)
Pt turnover from sunrise. Pt with rlq abd pain.

## 2014-06-11 NOTE — ED Notes (Signed)
Pt sleeping, resps unlabored.  

## 2014-06-11 NOTE — ED Notes (Signed)
Assisted Dr. Dahlia Client with pelvic exam 06:20 Glucose check 06:28 - results [id number]

## 2014-06-11 NOTE — ED Notes (Signed)
Pt sipping on po contrast. 

## 2014-06-11 NOTE — ED Provider Notes (Signed)
Patient seen prior to epic migration. Please refer to paper T-sheet for history and physical exam.  Loney Hering, MD 06/11/14 [phone]

## 2014-06-11 NOTE — ED Notes (Signed)
Report to [place], rn.

## 2014-06-11 NOTE — ED Notes (Signed)
Pt set up for pelvic exam. Pt up to commode to void.

## 2014-06-11 NOTE — Discharge Instructions (Signed)
Abdominal Pain Many things can cause abdominal pain. Usually, abdominal pain is not caused by a disease and will improve without treatment. It can often be observed and treated at home. Your health care provider will do a physical exam and possibly order blood tests and X-rays to help determine the seriousness of your pain. However, in many cases, more time must pass before a clear cause of the pain can be found. Before that point, your health care provider may not know if you need more testing or further treatment. HOME CARE INSTRUCTIONS  Monitor your abdominal pain for any changes. The following actions may help to alleviate any discomfort you are experiencing:  Only take over-the-counter or prescription medicines as directed by your health care provider.  Do not take laxatives unless directed to do so by your health care provider.  Try a clear liquid diet (broth, tea, or water) as directed by your health care provider. Slowly move to a bland diet as tolerated. SEEK MEDICAL CARE IF:  You have unexplained abdominal pain.  You have abdominal pain associated with nausea or diarrhea.  You have pain when you urinate or have a bowel movement.  You experience abdominal pain that wakes you in the night.  You have abdominal pain that is worsened or improved by eating food.  You have abdominal pain that is worsened with eating fatty foods.  You have a fever. SEEK IMMEDIATE MEDICAL CARE IF:   Your pain does not go away within 2 hours.  You keep throwing up (vomiting).  Your pain is felt only in portions of the abdomen, such as the right side or the left lower portion of the abdomen.  You pass bloody or black tarry stools. MAKE SURE YOU:  Understand these instructions.   Will watch your condition.   Will get help right away if you are not doing well or get worse.  Document Released: 11/06/2004 Document Revised: 02/01/2013 Document Reviewed: 10/06/2012 [facility] Patient Information  2015 [place], [place]. This information is not intended to replace advice given to you by your health care provider. Make sure you discuss any questions you have with your health care provider.  Bacterial Vaginosis Bacterial vaginosis is an infection of the vagina. It happens when too many of certain germs (bacteria) grow in the vagina. HOME CARE  Take your medicine as told by your doctor.  Finish your medicine even if you start to feel better.  Do not have sex until you finish your medicine and are better.  Tell your sex partner that you have an infection. They should see their doctor for treatment.  Practice safe sex. Use condoms. Have only one sex partner. GET HELP IF:  You are not getting better after 3 days of treatment.  You have more grey fluid (discharge) coming from your vagina than before.  You have more pain than before.  You have a fever. MAKE SURE YOU:   Understand these instructions.  Will watch your condition.  Will get help right away if you are not doing well or get worse. Document Released: 11/06/2007 Document Revised: 11/17/2012 Document Reviewed: 09/08/2012 [facility] Patient Information 2015 [place], [place]. This information is not intended to replace advice given to you by your health care provider. Make sure you discuss any questions you have with your health care provider.

## 2014-06-11 NOTE — ED Notes (Signed)
Pt continues to sleep. resps unlabored.  

## 2014-06-11 NOTE — ED Notes (Signed)
Pt returned from ct scan. Pt declines offer for additional pain medication. Call bell at side. Vss.

## 2014-06-11 NOTE — ED Notes (Signed)
Patient transported to CT 

## 2014-06-12 ENCOUNTER — Encounter: Payer: Self-pay | Admitting: Emergency Medicine

## 2014-06-12 ENCOUNTER — Emergency Department
Admission: EM | Admit: 2014-06-12 | Discharge: 2014-06-12 | Disposition: A | Discharge status: Home / Self Care | Payer: Self-pay | Attending: Emergency Medicine | Admitting: Emergency Medicine

## 2014-06-12 ENCOUNTER — Emergency Department: Payer: Self-pay

## 2014-06-12 DIAGNOSIS — R109 Unspecified abdominal pain: Secondary | ICD-10-CM | POA: Insufficient documentation

## 2014-06-12 DIAGNOSIS — Z720 Tobacco use: Secondary | ICD-10-CM | POA: Insufficient documentation

## 2014-06-12 DIAGNOSIS — Z792 Long term (current) use of antibiotics: Secondary | ICD-10-CM | POA: Insufficient documentation

## 2014-06-12 DIAGNOSIS — E119 Type 2 diabetes mellitus without complications: Secondary | ICD-10-CM | POA: Insufficient documentation

## 2014-06-12 DIAGNOSIS — J159 Unspecified bacterial pneumonia: Secondary | ICD-10-CM | POA: Insufficient documentation

## 2014-06-12 DIAGNOSIS — Z79899 Other long term (current) drug therapy: Secondary | ICD-10-CM | POA: Insufficient documentation

## 2014-06-12 DIAGNOSIS — J189 Pneumonia, unspecified organism: Secondary | ICD-10-CM

## 2014-06-12 DIAGNOSIS — Z791 Long term (current) use of non-steroidal anti-inflammatories (NSAID): Secondary | ICD-10-CM | POA: Insufficient documentation

## 2014-06-12 DIAGNOSIS — R0789 Other chest pain: Secondary | ICD-10-CM

## 2014-06-12 LAB — BASIC METABOLIC PANEL WITH GFR
Anion gap: 10 (ref 5–15)
BUN: 13 mg/dL (ref 6–20)
CO2: 20 mmol/L — ABNORMAL LOW (ref 22–32)
Calcium: 9.1 mg/dL (ref 8.9–10.3)
Chloride: 104 mmol/L (ref 101–111)
Creatinine, Ser: 0.8 mg/dL (ref 0.44–1.00)
GFR calc Af Amer: >60 mL/min
GFR calc non Af Amer: >60 mL/min
Glucose, Bld: 413 mg/dL — ABNORMAL HIGH (ref 65–99)
Potassium: 3.8 mmol/L (ref 3.5–5.1)
Sodium: 134 mmol/L — ABNORMAL LOW (ref 135–145)

## 2014-06-12 LAB — CBC
HCT: 39.1 % (ref 35.0–47.0)
Hemoglobin: 13.2 g/dL (ref 12.0–16.0)
MCH: 29.7 pg (ref 26.0–34.0)
MCHC: 33.8 g/dL (ref 32.0–36.0)
MCV: 87.9 fL (ref 80.0–100.0)
Platelets: 175 10*3/uL (ref 150–440)
RBC: 4.45 MIL/uL (ref 3.80–5.20)
RDW: 13.5 % (ref 11.5–14.5)
WBC: 14.6 10*3/uL — AB (ref 3.6–11.0)

## 2014-06-12 LAB — TROPONIN I: Troponin I: <0.03 ng/mL

## 2014-06-12 MED ORDER — LEVOFLOXACIN 250 MG PO TABS: 250.0000 mg | ORAL_TABLET | Freq: Every day | ORAL | Status: AC

## 2014-06-12 MED ORDER — GI COCKTAIL ~~LOC~~
ORAL | Status: AC
  Administered 2014-06-12: 30 mL via ORAL
  Filled 2014-06-12: qty 30

## 2014-06-12 MED ORDER — LEVOFLOXACIN 250 MG PO TABS
ORAL_TABLET | ORAL | Status: AC
  Administered 2014-06-12: 250 mg via ORAL
  Filled 2014-06-12: qty 1

## 2014-06-12 MED ORDER — LEVOFLOXACIN 250 MG PO TABS
250.0000 mg | ORAL_TABLET | Freq: Once | ORAL | Status: AC
  Administered 2014-06-12: 250 mg via ORAL

## 2014-06-12 MED ORDER — FENTANYL CITRATE (PF) 100 MCG/2ML IJ SOLN: 50.0000 ug | Freq: Once | INTRAMUSCULAR | Status: DC

## 2014-06-12 MED ORDER — FENTANYL CITRATE (PF) 100 MCG/2ML IJ SOLN
INTRAMUSCULAR | Status: AC
  Administered 2014-06-12: 50 ug
  Filled 2014-06-12: qty 2

## 2014-06-12 MED ORDER — BENZONATATE 100 MG PO CAPS: 100.0000 mg | ORAL_CAPSULE | Freq: Four times a day (QID) | ORAL | Status: AC | PRN

## 2014-06-12 MED ORDER — GI COCKTAIL ~~LOC~~
30.0000 mL | Freq: Once | ORAL | Status: AC
  Administered 2014-06-12: 30 mL via ORAL

## 2014-06-12 NOTE — Discharge Instructions (Signed)
Pneumonia, Adult  Pneumonia is an infection of the lungs. It may be caused by a germ (virus or bacteria). Some types of pneumonia can spread easily from person to person. This can happen when you cough or sneeze.  HOME CARE   Only take medicine as told by your doctor.   Take your medicine (antibiotics) as told. Finish it even if you start to feel better.   Do not smoke.   You may use a vaporizer or humidifier in your room. This can help loosen thick spit (mucus).   Sleep so you are almost sitting up (semi-upright). This helps reduce coughing.   Rest.  A shot (vaccine) can help prevent pneumonia. Shots are often advised for:   People over 65 years old.   Patients on chemotherapy.   People with long-term (chronic) lung problems.   People with immune system problems.  GET HELP RIGHT AWAY IF:    You are getting worse.   You cannot control your cough, and you are losing sleep.   You cough up blood.   Your pain gets worse, even with medicine.   You have a fever.   Any of your problems are getting worse, not better.   You have shortness of breath or chest pain.  MAKE SURE YOU:    Understand these instructions.   Will watch your condition.   Will get help right away if you are not doing well or get worse.  Document Released: 07/16/2007 Document Revised: 04/21/2011 Document Reviewed: 04/19/2010  [employer] Patient Information 2015 [employer]. This information is not intended to replace advice given to you by your health care provider. Make sure you discuss any questions you have with your health care provider.

## 2014-06-12 NOTE — ED Notes (Signed)
Report received by Dianna Limbo

## 2014-06-12 NOTE — ED Notes (Signed)
Pt was seen here on last Saturday night for abd pain/bacterial vaginosis.

## 2014-06-12 NOTE — ED Provider Notes (Signed)
[facility] Emergency Department Provider Note    ____________________________________________  Time seen: 2000  I have reviewed the triage vital signs and the nursing notes.   HISTORY  Chief Complaint Chest Pain  HPI Julie Lambert is a 49 y.o. female who presents to the emergency department today because of left-sided chest pain. The pain started this morning. Patient states she was doing nothing at the time. The pain has been constant. She describes it as a sensation that someone is throwing sharp objects at her chest. She states it does hurt when she moves positions. Denies similar symptoms in the past. Patient was seen yesterday in the emergency department for abdominal pain. Was started on Flagyl.   Past Medical History  Diagnosis Date  . Diabetes mellitus without complication     There are no active problems to display for this patient.   Past Surgical History  Procedure Laterality Date  . Cholecystectomy    . Lumbar spine surgery      Current Outpatient Rx  Name  Route  Sig  Dispense  Refill  . ALPRAZolam (XANAX) 0.5 MG tablet   Oral   Take 0.5 mg by mouth at bedtime as needed for anxiety.         . citalopram (CELEXA) 20 MG tablet   Oral   Take 20 mg by mouth daily.         . metFORMIN (GLUCOPHAGE) 500 MG tablet   Oral   Take by mouth 2 (two) times daily with a meal.         . metroNIDAZOLE (FLAGYL) 500 MG tablet   Oral   Take 1 tablet (500 mg total) by mouth 2 (two) times daily.   14 tablet   0   . Naproxen Sodium 220 MG CAPS   Oral   Take by mouth.         . traMADol (ULTRAM) 50 MG tablet   Oral   Take 1 tablet (50 mg total) by mouth every 6 (six) hours as needed.   12 tablet   0     Allergies Iodinated diagnostic agents  No family history on file.  Social History History  Substance Use Topics  . Smoking status: Current Every Day Smoker -- 0.50 packs/day    Types: Cigarettes  . Smokeless tobacco:  Not on file  . Alcohol Use: No    Review of Systems Constitutional: Negative for fever. Cardiovascular: Positive for chest pain. Respiratory: Negative for shortness of breath. Gastrointestinal: Positive for abd pain Genitourinary: Negative for dysuria. Musculoskeletal: Negative for back pain. Skin: Negative for rash. Neurological: Negative for headaches, focal weakness or numbness.  10-point ROS otherwise negative.  ____________________________________________   PHYSICAL EXAM:  VITAL SIGNS: ED Triage Vitals  Enc Vitals Group     BP 06/12/14 1438 125/74 mmHg     Pulse Rate 06/12/14 1438 105     Resp 06/12/14 1438 20     Temp 06/12/14 1438 98.6 F (37 C)     Temp Source 06/12/14 1438 Oral     SpO2 06/12/14 1438 98 %     Weight 06/12/14 1438 200 lb (90.719 kg)     Height 06/12/14 1438 5\' 7"  (1.702 m)     Head Cir --      Peak Flow --      Pain Score 06/12/14 1440 10     Pain Loc --      Pain Edu? --      Excl.  in [place]? --     Constitutional: Alert and oriented. Well appearing and in no distress. Eyes: Conjunctivae are normal. PERRL. Normal extraocular movements. ENT   Head: Normocephalic and atraumatic.   Nose: No congestion/rhinnorhea.   Mouth/Throat: Mucous membranes are moist.   Neck: No stridor. Cardiovascular: Normal rate, regular rhythm. Normal and symmetric distal pulses are present in all extremities. No murmurs, rubs, or gallops. Respiratory: Normal respiratory effort without tachypnea nor retractions. Breath sounds are clear and equal bilaterally. No wheezes/rales/rhonchi. Gastrointestinal: Soft minimally tender about epigastric region. No distention. Genitourinary: deferred Musculoskeletal: Normal range of motion in all extremities.  Neurologic:  Normal speech and language. No gross focal neurologic deficits are appreciated. Speech is normal. No gait instability. Skin:  Skin is warm, dry and intact. No rash noted. Psychiatric: Mood and affect  are normal. Speech and behavior are normal. Patient exhibits appropriate insight and judgment.  ____________________________________________    LABS (pertinent positives/negatives)  Labs Reviewed  BASIC METABOLIC PANEL - Abnormal; Notable for the following:    Sodium 134 (*)    CO2 20 (*)    Glucose, Bld 413 (*)    All other components within normal limits  CBC - Abnormal; Notable for the following:    WBC 14.6 (*)    All other components within normal limits  TROPONIN I     ____________________________________________   EKG  ED ECG REPORT   Date: 06/12/2014  EKG Time: 1433  Rate: 98  Rhythm: normal sinus rhythm  Axis: LAD  Intervals:none  ST&T Change: T wave inversion V1  ____________________________________________    RADIOLOGY  IMPRESSION: Left upper lobe airspace disease most consistent with pneumonia. Follow-up plain films to clearing are recommended.   Electronically Signed By: Inge Rise M.D. On: 06/12/2014 21:06  ____________________________________________   PROCEDURES  Procedure(s) performed: None  Critical Care performed: No  ____________________________________________   INITIAL IMPRESSION / ASSESSMENT AND PLAN / ED COURSE  Pertinent labs & imaging results that were available during my care of the patient were reviewed by me and considered in my medical decision making (see chart for details).  Patient presents to the emergency department today with left upper quadrant pain that started today. On exam the patient appears well, in no concerning physical exam findings. Blood work shows mild leukocytosis. Will get chest x-ray.  ----------------------------------------- 9:10 PM on 06/12/2014 -----------------------------------------  X-ray shows pneumonia. Will treat with outpatient antibiotics. Review return precautions.   ____________________________________________   FINAL CLINICAL IMPRESSION(S) / ED DIAGNOSES  Final  diagnoses:  Community acquired pneumonia  Other chest pain     Nance Pear, MD 06/12/14 2222

## 2014-06-15 ENCOUNTER — Telehealth: Payer: Self-pay | Admitting: Emergency Medicine

## 2014-06-15 NOTE — ED Notes (Signed)
Left note for patient at stat desk

## 2014-11-05 ENCOUNTER — Emergency Department
Admission: EM | Admit: 2014-11-05 | Discharge: 2014-11-05 | Disposition: A | Discharge status: Home / Self Care | Payer: Self-pay | Attending: Emergency Medicine | Admitting: Emergency Medicine

## 2014-11-05 ENCOUNTER — Encounter: Payer: Self-pay | Admitting: *Deleted

## 2014-11-05 ENCOUNTER — Emergency Department: Payer: Self-pay

## 2014-11-05 DIAGNOSIS — R1031 Right lower quadrant pain: Secondary | ICD-10-CM | POA: Insufficient documentation

## 2014-11-05 DIAGNOSIS — Z791 Long term (current) use of non-steroidal anti-inflammatories (NSAID): Secondary | ICD-10-CM | POA: Insufficient documentation

## 2014-11-05 DIAGNOSIS — N390 Urinary tract infection, site not specified: Secondary | ICD-10-CM | POA: Insufficient documentation

## 2014-11-05 DIAGNOSIS — Z9119 Patient's noncompliance with other medical treatment and regimen: Secondary | ICD-10-CM | POA: Insufficient documentation

## 2014-11-05 DIAGNOSIS — R110 Nausea: Secondary | ICD-10-CM | POA: Insufficient documentation

## 2014-11-05 DIAGNOSIS — Z79899 Other long term (current) drug therapy: Secondary | ICD-10-CM | POA: Insufficient documentation

## 2014-11-05 DIAGNOSIS — Z720 Tobacco use: Secondary | ICD-10-CM | POA: Insufficient documentation

## 2014-11-05 DIAGNOSIS — E1165 Type 2 diabetes mellitus with hyperglycemia: Secondary | ICD-10-CM | POA: Insufficient documentation

## 2014-11-05 LAB — URINALYSIS COMPLETE WITH MICROSCOPIC (ARMC ONLY)
BILIRUBIN URINE: NEGATIVE
Bacteria, UA: NONE SEEN
Ketones, ur: NEGATIVE mg/dL
NITRITE: NEGATIVE
Protein, ur: NEGATIVE mg/dL
SPECIFIC GRAVITY, URINE: 1.031 — AB (ref 1.005–1.030)
pH: 6 (ref 5.0–8.0)

## 2014-11-05 LAB — CBC
HCT: 41.4 % (ref 35.0–47.0)
HEMOGLOBIN: 13.8 g/dL (ref 12.0–16.0)
MCH: 30.1 pg (ref 26.0–34.0)
MCHC: 33.4 g/dL (ref 32.0–36.0)
MCV: 90 fL (ref 80.0–100.0)
PLATELETS: 204 10*3/uL (ref 150–440)
RBC: 4.6 MIL/uL (ref 3.80–5.20)
RDW: 13.4 % (ref 11.5–14.5)
WBC: 9.6 10*3/uL (ref 3.6–11.0)

## 2014-11-05 LAB — BASIC METABOLIC PANEL
Anion gap: 11 (ref 5–15)
BUN: 10 mg/dL (ref 6–20)
CALCIUM: 8.8 mg/dL — AB (ref 8.9–10.3)
CO2: 21 mmol/L — ABNORMAL LOW (ref 22–32)
CREATININE: 0.83 mg/dL (ref 0.44–1.00)
Chloride: 101 mmol/L (ref 101–111)
Glucose, Bld: 567 mg/dL (ref 65–99)
Potassium: 3.7 mmol/L (ref 3.5–5.1)
Sodium: 133 mmol/L — ABNORMAL LOW (ref 135–145)

## 2014-11-05 LAB — GLUCOSE, CAPILLARY
Glucose-Capillary: 536 mg/dL — ABNORMAL HIGH (ref 65–99)
Glucose-Capillary: 328 mg/dL — ABNORMAL HIGH (ref 65–99)

## 2014-11-05 LAB — PREGNANCY, URINE: Preg Test, Ur: NEGATIVE

## 2014-11-05 MED ORDER — METFORMIN HCL 500 MG PO TABS: 500.0000 mg | ORAL_TABLET | Freq: Two times a day (BID) | ORAL | Status: DC

## 2014-11-05 MED ORDER — CIPROFLOXACIN HCL 500 MG PO TABS: 500.0000 mg | ORAL_TABLET | Freq: Two times a day (BID) | ORAL | Status: AC

## 2014-11-05 MED ORDER — ONDANSETRON HCL 4 MG/2ML IJ SOLN
4.0000 mg | Freq: Once | INTRAMUSCULAR | Status: AC
  Administered 2014-11-05: 4 mg via INTRAVENOUS
  Filled 2014-11-05: qty 2

## 2014-11-05 MED ORDER — TRAMADOL HCL 50 MG PO TABS
50.0000 mg | ORAL_TABLET | Freq: Once | ORAL | Status: AC
  Administered 2014-11-05: 50 mg via ORAL
  Filled 2014-11-05: qty 1

## 2014-11-05 MED ORDER — KETOROLAC TROMETHAMINE 30 MG/ML IJ SOLN
30.0000 mg | Freq: Once | INTRAMUSCULAR | Status: AC
  Administered 2014-11-05: 30 mg via INTRAVENOUS
  Filled 2014-11-05: qty 1

## 2014-11-05 MED ORDER — INSULIN REGULAR HUMAN 100 UNIT/ML IJ SOLN: 8.0000 [IU] | Freq: Once | INTRAMUSCULAR | Status: AC

## 2014-11-05 MED ORDER — DEXTROSE 5 % IV SOLN
1.0000 g | Freq: Once | INTRAVENOUS | Status: AC
  Administered 2014-11-05: 1 g via INTRAVENOUS
  Filled 2014-11-05: qty 10

## 2014-11-05 MED ORDER — SODIUM CHLORIDE 0.9 % IV BOLUS (SEPSIS)
1000.0000 mL | Freq: Once | INTRAVENOUS | Status: AC
  Administered 2014-11-05: 1000 mL via INTRAVENOUS

## 2014-11-05 MED ORDER — INSULIN ASPART 100 UNIT/ML ~~LOC~~ SOLN
SUBCUTANEOUS | Status: AC
  Administered 2014-11-05: 100 [IU]
  Filled 2014-11-05: qty 8

## 2014-11-05 NOTE — ED Notes (Signed)
Pregnancy poct was not done in triage - added on to urine in the lab with lab being made aware

## 2014-11-05 NOTE — ED Notes (Signed)
Glucose results: 328

## 2014-11-05 NOTE — Discharge Instructions (Signed)

## 2014-11-05 NOTE — ED Notes (Signed)
Blood sugar less than 300. Dr. Marcelene Butte notified. Pt states "i am so ready to get out of here."

## 2014-11-05 NOTE — ED Notes (Signed)
Pt reports foul smelling urine and urinary frequency x 1 week. Lower back pain and abdominal pain

## 2014-11-05 NOTE — ED Provider Notes (Signed)
Time Seen: Approximately.----------------------------------------- 3:52 PM on 11/05/2014 -----------------------------------------    I have reviewed the triage notes  Chief Complaint: Urinary Frequency and Hyperglycemia   History of Present Illness: Julie Lambert is a 49 y.o. female who presents with progressive urinary complaints over the past week. Patient states she's also had some right-sided lower back and abdominal pain. Patient is a non-insulin-dependent diabetic and feel he states that she's been inconsistent in taking her metformin. She denies any fever though has noticed some urinary frequency and what she describes as foul-smelling urine over the last several days. She denies any chest pain or shortness of breath. She's had some occasional nausea but no persistent vomiting. Patient denies any fever or productive cough. She denies any skin lesions are on healing wounds.  Past Medical History  Diagnosis Date  . Diabetes mellitus without complication     There are no active problems to display for this patient.   Past Surgical History  Procedure Laterality Date  . Cholecystectomy    . Lumbar spine surgery    . Back surgery      Past Surgical History  Procedure Laterality Date  . Cholecystectomy    . Lumbar spine surgery    . Back surgery      Current Outpatient Rx  Name  Route  Sig  Dispense  Refill  . ALPRAZolam (XANAX) 0.5 MG tablet   Oral   Take 0.5 mg by mouth at bedtime as needed for anxiety.         . benzonatate (TESSALON PERLES) 100 MG capsule   Oral   Take 1 capsule (100 mg total) by mouth every 6 (six) hours as needed for cough.   30 capsule   0   . citalopram (CELEXA) 20 MG tablet   Oral   Take 20 mg by mouth daily.         . metFORMIN (GLUCOPHAGE) 500 MG tablet   Oral   Take by mouth 2 (two) times daily with a meal.         . Naproxen Sodium 220 MG CAPS   Oral   Take by mouth.         . traMADol (ULTRAM) 50 MG tablet  Oral   Take 1 tablet (50 mg total) by mouth every 6 (six) hours as needed.   12 tablet   0     Allergies:  Iodinated diagnostic agents  Family History: No family history on file.  Social History: Social History  Substance Use Topics  . Smoking status: Current Every Day Smoker -- 0.50 packs/day    Types: Cigarettes  . Smokeless tobacco: None  . Alcohol Use: No     Review of Systems:   10 point review of systems was performed and was otherwise negative:  Constitutional: No fever Eyes: No visual disturbances ENT: No sore throat, ear pain Cardiac: No chest pain Respiratory: No shortness of breath, wheezing, or stridor Abdomen: No abdominal pain, no vomiting, No diarrhea Endocrine: Patient has been intentionally trying to lose some weight but is noticed also on accelerated weight loss over the last 2 weeks, No night sweats Extremities: No peripheral edema, cyanosis Skin: No rashes, easy bruising Neurologic: No focal weakness, trouble with speech or swollowing Urologic: No dysuria, Hematuria, she does describe urinary frequency and states she's continuously thirsty.   Physical Exam:  ED Triage Vitals  Enc Vitals Group     BP 11/05/14 1520 111/77 mmHg     Pulse Rate 11/05/14  1520 86     Resp 11/05/14 1520 16     Temp 11/05/14 1520 98.2 F (36.8 C)     Temp Source 11/05/14 1520 Oral     SpO2 11/05/14 1520 100 %     Weight 11/05/14 1520 180 lb (81.647 kg)     Height 11/05/14 1520 5\' 7"  (1.702 m)     Head Cir --      Peak Flow --      Pain Score 11/05/14 1517 10     Pain Loc --      Pain Edu? --      Excl. in [place]? --     General: Awake , Alert , and Oriented times 3; GCS 15 Head: Normal cephalic , atraumatic Eyes: Pupils equal , round, reactive to light Nose/Throat: No nasal drainage, patent upper airway without erythema or exudate.  Neck: Supple, Full range of motion, No anterior adenopathy or palpable thyroid masses Lungs: Clear to ascultation without wheezes  , rhonchi, or rales Heart: Regular rate, regular rhythm without murmurs , gallops , or rubs Abdomen: Soft, non tender without rebound, guarding , or rigidity; bowel sounds positive and symmetric in all 4 quadrants. No organomegaly .        Extremities: 2 plus symmetric pulses. No edema, clubbing or cyanosis Neurologic: normal ambulation, Motor symmetric without deficits, sensory intact Skin: warm, dry, no rashes   Labs:   All laboratory work was reviewed including any pertinent negatives or positives listed below:  Labs Reviewed  [place] ([place]) - Abnormal; Notable for the following:    Color, Urine COLORLESS (*)    APPearance CLEAR (*)    Glucose, UA >500 (*)    Specific Gravity, Urine 1.031 (*)    Hgb urine dipstick 1+ (*)    Leukocytes, UA 2+ (*)    Squamous Epithelial / LPF 0-5 (*)    All other components within normal limits  CBC  BASIC METABOLIC PANEL  POC URINE PREG, ED   serial blood sugars were obtained (see nurses note). Blood sugar gradually reduced and was below 300 on her last fingerstick.     Radiology:    EXAM: CT ABDOMEN AND PELVIS WITHOUT CONTRAST  TECHNIQUE: Multidetector CT imaging of the abdomen and pelvis was performed following the standard protocol without IV contrast.  COMPARISON: CT abdomen pelvis 06/11/2014  FINDINGS: Lower chest: Normal heart size. Dependent atelectasis within the bilateral lower lobes. No pleural effusion.  Hepatobiliary: There is a 6.8 cm exophytic mass off the left hepatic lobe (image 15; series 2). This is increased in size from prior exams where it measured 5.4 cm. This is best visualized on image 15; series 2). Patient status post cholecystectomy.  Pancreas: Unremarkable  Spleen: Unremarkable  Adrenals/Urinary Tract: Normal adrenal glands. Kidneys are symmetric in size. No hydronephrosis. No nephroureterolithiasis. Urinary bladder is unremarkable.  Stomach/Bowel: Sigmoid  colonic diverticulosis. No CT evidence for acute diverticulitis. The appendix is normal. No evidence for bowel obstruction. No free fluid or free intraperitoneal air.  Vascular/Lymphatic: Normal caliber abdominal aorta. No retroperitoneal lymphadenopathy.  Other: Uterus and adnexal structures are stable in appearance. There is a stable 4.4 cm soft tissue mass within the left adnexa which may potentially represent a pedunculated fibroid.  Musculoskeletal: No aggressive or acute appearing osseous lesions. Lumbar spine degenerative changes.  IMPRESSION: No acute process within the abdomen or pelvis.  Interval enlargement of exophytic mass off the left hepatic lobe which may potentially represent a large hemangioma.  Recommend dedicated evaluation with pre and post contrast-enhanced MRI.   I personally reviewed the radiologic studies    ED Course:  Patient was given IV fluids and also IV medication for urinary tract infection. I felt was unlikely the patient was uroseptic. The mass seen on the CAT scan I felt was of no clinical significance and the patient was advised that she may need a repeat CAT scan in 6 months for further evaluation. Patient does not appear to have any issues with renal colic associated with her urinary tract infection. I felt was unlikely that her abdominal pain was secondary to a surgical cause such as acute appendicitis, etc. Patient was given IV fluids and IV insulin and her blood sugar was monitored over significant period of time here in emergency department. She freely states she's been intermittent with her metformin and states that she couldn't afford the medication but should be able to buy medication and strips on later this week. She was referred to follow up to the [facility].   Assessment:  Hyperglycemia Noncompliance with medication Urinary tract infection Right lower quadrant abdominal pain secondary to above      Plan:  Outpatient  management Patient was advised to return immediately if condition worsens. Patient was advised to follow up with her primary care physician or other specialized physicians involved and in their current assessment.             Daymon Larsen, MD 11/05/14 2003

## 2014-11-06 LAB — GLUCOSE, CAPILLARY: GLUCOSE-CAPILLARY: 286 mg/dL — AB (ref 65–99)

## 2014-12-15 ENCOUNTER — Emergency Department
Admission: EM | Admit: 2014-12-15 | Discharge: 2014-12-15 | Disposition: A | Discharge status: Home / Self Care | Payer: Self-pay | Attending: Emergency Medicine | Admitting: Emergency Medicine

## 2014-12-15 ENCOUNTER — Encounter: Payer: Self-pay | Admitting: *Deleted

## 2014-12-15 ENCOUNTER — Encounter: Payer: Self-pay | Admitting: Emergency Medicine

## 2014-12-15 ENCOUNTER — Emergency Department
Admission: EM | Admit: 2014-12-15 | Discharge: 2014-12-15 | Discharge status: Against Medical Advice | Payer: Self-pay | Attending: Emergency Medicine | Admitting: Emergency Medicine

## 2014-12-15 DIAGNOSIS — Z720 Tobacco use: Secondary | ICD-10-CM | POA: Insufficient documentation

## 2014-12-15 DIAGNOSIS — B3731 Acute candidiasis of vulva and vagina: Secondary | ICD-10-CM

## 2014-12-15 DIAGNOSIS — E1165 Type 2 diabetes mellitus with hyperglycemia: Secondary | ICD-10-CM | POA: Insufficient documentation

## 2014-12-15 DIAGNOSIS — Z79899 Other long term (current) drug therapy: Secondary | ICD-10-CM | POA: Insufficient documentation

## 2014-12-15 DIAGNOSIS — N760 Acute vaginitis: Secondary | ICD-10-CM | POA: Insufficient documentation

## 2014-12-15 DIAGNOSIS — E119 Type 2 diabetes mellitus without complications: Secondary | ICD-10-CM | POA: Insufficient documentation

## 2014-12-15 DIAGNOSIS — B373 Candidiasis of vulva and vagina: Secondary | ICD-10-CM | POA: Insufficient documentation

## 2014-12-15 LAB — URINALYSIS COMPLETE WITH MICROSCOPIC (ARMC ONLY)
BACTERIA UA: NONE SEEN
Bilirubin Urine: NEGATIVE
Glucose, UA: >500 mg/dL — AB
Ketones, ur: NEGATIVE mg/dL
Nitrite: NEGATIVE
PH: 5 (ref 5.0–8.0)
PROTEIN: NEGATIVE mg/dL
Specific Gravity, Urine: 1.03 (ref 1.005–1.030)

## 2014-12-15 LAB — CBC
HEMATOCRIT: 41.1 % (ref 35.0–47.0)
Hemoglobin: 14.1 g/dL (ref 12.0–16.0)
MCH: 30.4 pg (ref 26.0–34.0)
MCHC: 34.2 g/dL (ref 32.0–36.0)
MCV: 88.9 fL (ref 80.0–100.0)
Platelets: 229 10*3/uL (ref 150–440)
RBC: 4.63 MIL/uL (ref 3.80–5.20)
RDW: 13.1 % (ref 11.5–14.5)
WBC: 13.3 10*3/uL — AB (ref 3.6–11.0)

## 2014-12-15 LAB — BASIC METABOLIC PANEL
Anion gap: 6 (ref 5–15)
BUN: 15 mg/dL (ref 6–20)
CHLORIDE: 105 mmol/L (ref 101–111)
CO2: 24 mmol/L (ref 22–32)
CREATININE: 0.79 mg/dL (ref 0.44–1.00)
Calcium: 9.1 mg/dL (ref 8.9–10.3)
GFR calc Af Amer: >60 mL/min (ref >60)
GFR calc non Af Amer: >60 mL/min (ref >60)
Glucose, Bld: 359 mg/dL — ABNORMAL HIGH (ref 65–99)
POTASSIUM: 4.1 mmol/L (ref 3.5–5.1)
SODIUM: 135 mmol/L (ref 135–145)

## 2014-12-15 LAB — GLUCOSE, CAPILLARY
GLUCOSE-CAPILLARY: 404 mg/dL — AB (ref 65–99)
Glucose-Capillary: 351 mg/dL — ABNORMAL HIGH (ref 65–99)

## 2014-12-15 MED ORDER — FLUCONAZOLE 50 MG PO TABS
ORAL_TABLET | ORAL | Status: AC
  Filled 2014-12-15: qty 1

## 2014-12-15 MED ORDER — FLUCONAZOLE 150 MG PO TABS: 150.0000 mg | ORAL_TABLET | Freq: Once | ORAL | Status: DC

## 2014-12-15 MED ORDER — FLUCONAZOLE 50 MG PO TABS
150.0000 mg | ORAL_TABLET | Freq: Once | ORAL | Status: AC
  Administered 2014-12-15: 150 mg via ORAL

## 2014-12-15 MED ORDER — FLUCONAZOLE 100 MG PO TABS
ORAL_TABLET | ORAL | Status: AC
  Filled 2014-12-15: qty 1

## 2014-12-15 NOTE — ED Notes (Signed)
Pt here earlier today and had blood work and urine completed but had to leave to go to work.

## 2014-12-15 NOTE — ED Notes (Addendum)
Yeast infection x 3 days.  OTC medication is not working. Also states that her sugars have been elevated since the yeast infection started.

## 2014-12-15 NOTE — Discharge Instructions (Signed)

## 2014-12-15 NOTE — ED Notes (Signed)
Pt reports since middle of period last week noticing tenderness when using a tampon. Pt now reports over the past three days tenderness and discharge have increased. Pt denies abd pain or fevers at this time.

## 2014-12-15 NOTE — ED Provider Notes (Signed)
[facility] Emergency Department Provider Note  ____________________________________________  Time seen: On arrival  I have reviewed the triage vital signs and the nursing notes.   HISTORY  Chief Complaint Vaginal Discharge    HPI Julie Lambert is a 49 y.o. female who presents with complaints of vaginal itching. She reports she has a yeast infection but over-the-counter medications are not working. She has no concern about STDs. She denies fevers or chills or abdominal pain. No nausea vomiting.    Past Medical History  Diagnosis Date  . Diabetes mellitus without complication ([place])     There are no active problems to display for this patient.   Past Surgical History  Procedure Laterality Date  . Cholecystectomy    . Lumbar spine surgery    . Back surgery      Current Outpatient Rx  Name  Route  Sig  Dispense  Refill  . ALPRAZolam (XANAX) 0.5 MG tablet   Oral   Take 0.5 mg by mouth at bedtime as needed for anxiety.         . benzonatate (TESSALON PERLES) 100 MG capsule   Oral   Take 1 capsule (100 mg total) by mouth every 6 (six) hours as needed for cough.   30 capsule   0   . citalopram (CELEXA) 20 MG tablet   Oral   Take 20 mg by mouth daily.         . fluconazole (DIFLUCAN) 150 MG tablet   Oral   Take 1 tablet (150 mg total) by mouth once.   1 tablet   0   . metFORMIN (GLUCOPHAGE) 500 MG tablet   Oral   Take 1 tablet (500 mg total) by mouth 2 (two) times daily with a meal.   60 tablet   11   . traMADol (ULTRAM) 50 MG tablet   Oral   Take 1 tablet (50 mg total) by mouth every 6 (six) hours as needed.   12 tablet   0     Allergies Iodinated diagnostic agents  History reviewed. No pertinent family history.  Social History Social History  Substance Use Topics  . Smoking status: Current Every Day Smoker -- 0.50 packs/day    Types: Cigarettes  . Smokeless tobacco: None  . Alcohol Use: No    Review of  Systems  Constitutional: Negative for fever. Eyes: Negative for visual changes. ENT: Negative for sore throat   Genitourinary: Negative for dysuria. Musculoskeletal: Negative for back pain. Skin: Negative for rash. Neurological: Negative for headaches or focal weakness   ____________________________________________   PHYSICAL EXAM:  VITAL SIGNS: ED Triage Vitals  Enc Vitals Group     BP 12/15/14 1933 129/82 mmHg     Pulse Rate 12/15/14 1933 98     Resp 12/15/14 1933 18     Temp 12/15/14 1933 98.8 F (37.1 C)     Temp Source 12/15/14 1933 Oral     SpO2 12/15/14 1933 98 %     Weight 12/15/14 1933 190 lb (86.183 kg)     Height 12/15/14 1933 5\' 7"  (1.702 m)     Head Cir --      Peak Flow --      Pain Score 12/15/14 2053 0     Pain Loc --      Pain Edu? --      Excl. in [place]? --      Constitutional: Alert and oriented. Well appearing and in no distress. Eyes: Conjunctivae  are normal.  ENT   Head: Normocephalic and atraumatic.   Mouth/Throat: Mucous membranes are moist. Cardiovascular: Normal rate, regular rhythm.  Respiratory: Normal respiratory effort without tachypnea nor retractions.  Gastrointestinal: Soft and non-tender in all quadrants. . GU: Patient refused Musculoskeletal: Nontender with normal range of motion in all extremities. Neurologic:  Normal speech and language. No gross focal neurologic deficits are appreciated. Skin:  Skin is warm, dry and intact. No rash noted. Psychiatric: Mood and affect are normal. Patient exhibits appropriate insight and judgment.  ____________________________________________    LABS (pertinent positives/negatives)  Labs Reviewed  GLUCOSE, CAPILLARY - Abnormal; Notable for the following:    Glucose-Capillary 404 (*)    All other components within normal limits  CBG MONITORING, ED    ____________________________________________     ____________________________________________    RADIOLOGY I have personally  reviewed any xrays that were ordered on this patient: None  ____________________________________________   PROCEDURES  Procedure(s) performed: none   ____________________________________________   INITIAL IMPRESSION / ASSESSMENT AND PLAN / ED COURSE  Pertinent labs & imaging results that were available during my care of the patient were reviewed by me and considered in my medical decision making (see chart for details).  Patient refused GU exam. She wants to be treated only for a yeast infection. We will treat with Diflucan once in the emergency department and once in 72 hours  ____________________________________________   FINAL CLINICAL IMPRESSION(S) / ED DIAGNOSES  Final diagnoses:  Yeast vaginitis     Lavonia Drafts, MD 12/15/14 2103

## 2014-12-15 NOTE — ED Notes (Signed)
Pt presents with  Vaginal itching and high blood sugar. Pt checked her blood sugar at home and was 400. Pt c/o being very tired.

## 2015-03-27 ENCOUNTER — Emergency Department
Admission: EM | Admit: 2015-03-27 | Discharge: 2015-03-27 | Disposition: A | Discharge status: Home / Self Care | Payer: Self-pay | Attending: Emergency Medicine | Admitting: Emergency Medicine

## 2015-03-27 ENCOUNTER — Encounter: Payer: Self-pay | Admitting: Emergency Medicine

## 2015-03-27 DIAGNOSIS — Z79899 Other long term (current) drug therapy: Secondary | ICD-10-CM | POA: Insufficient documentation

## 2015-03-27 DIAGNOSIS — N764 Abscess of vulva: Secondary | ICD-10-CM | POA: Insufficient documentation

## 2015-03-27 DIAGNOSIS — F17210 Nicotine dependence, cigarettes, uncomplicated: Secondary | ICD-10-CM | POA: Insufficient documentation

## 2015-03-27 DIAGNOSIS — Z7984 Long term (current) use of oral hypoglycemic drugs: Secondary | ICD-10-CM | POA: Insufficient documentation

## 2015-03-27 DIAGNOSIS — Z0189 Encounter for other specified special examinations: Secondary | ICD-10-CM

## 2015-03-27 DIAGNOSIS — Z7689 Persons encountering health services in other specified circumstances: Secondary | ICD-10-CM

## 2015-03-27 DIAGNOSIS — E119 Type 2 diabetes mellitus without complications: Secondary | ICD-10-CM | POA: Insufficient documentation

## 2015-03-27 MED ORDER — TRAMADOL HCL 50 MG PO TABS
50.0000 mg | ORAL_TABLET | Freq: Once | ORAL | Status: AC
  Administered 2015-03-27: 50 mg via ORAL
  Filled 2015-03-27: qty 1

## 2015-03-27 MED ORDER — PENTAFLUOROPROP-TETRAFLUOROETH EX AERO
INHALATION_SPRAY | CUTANEOUS | Status: AC
  Administered 2015-03-27: 1 via TOPICAL
  Filled 2015-03-27: qty 30

## 2015-03-27 MED ORDER — SULFAMETHOXAZOLE-TRIMETHOPRIM 800-160 MG PO TABS: 2.0000 | ORAL_TABLET | Freq: Two times a day (BID) | ORAL | Status: DC

## 2015-03-27 MED ORDER — SULFAMETHOXAZOLE-TRIMETHOPRIM 800-160 MG PO TABS
1.0000 | ORAL_TABLET | Freq: Once | ORAL | Status: AC
  Administered 2015-03-27: 1 via ORAL
  Filled 2015-03-27: qty 1

## 2015-03-27 MED ORDER — PENTAFLUOROPROP-TETRAFLUOROETH EX AERO
1.0000 "application " | INHALATION_SPRAY | CUTANEOUS | Status: DC | PRN
  Administered 2015-03-27: 1 via TOPICAL

## 2015-03-27 MED ORDER — LIDOCAINE-EPINEPHRINE (PF) 1 %-1:200000 IJ SOLN
30.0000 mL | Freq: Once | INTRAMUSCULAR | Status: AC
  Administered 2015-03-27: 30 mL via INTRADERMAL
  Filled 2015-03-27: qty 30

## 2015-03-27 MED ORDER — HYDROCODONE-ACETAMINOPHEN 5-325 MG PO TABS: 1.0000 | ORAL_TABLET | Freq: Two times a day (BID) | ORAL | Status: DC | PRN

## 2015-03-27 NOTE — ED Notes (Signed)
Pt states she has a "boil" on the left side of her outer vagina - Pt states minimal amount of bleeding from area today but no other drainage - Pt states area came up Saturday

## 2015-03-27 NOTE — ED Provider Notes (Signed)
[facility] Emergency Department Provider Note ____________________________________________  Time seen: 7:46PM  I have reviewed the triage vital signs and the nursing notes.  HISTORY  Chief Complaint  Recurrent Skin Infections  HPI Julie Lambert is a 50 y.o. female visits to the ED for evaluation management of a left vulvar abscess. She notes onset of it on Saturday. She denies any recurrence of similar abscesses for several years. She has applied warm compresses, Boil-eze, natural honey, and epsom salt, all without significant benefit. She denies any spontaneous drainage. Sherates her discomfort at a 10/10 in triage.  Past Medical History  Diagnosis Date  . Diabetes mellitus without complication ([place])     There are no active problems to display for this patient.   Past Surgical History  Procedure Laterality Date  . Cholecystectomy    . Lumbar spine surgery    . Back surgery      Current Outpatient Rx  Name  Route  Sig  Dispense  Refill  . ALPRAZolam (XANAX) 0.5 MG tablet   Oral   Take 0.5 mg by mouth at bedtime as needed for anxiety.         . benzonatate (TESSALON PERLES) 100 MG capsule   Oral   Take 1 capsule (100 mg total) by mouth every 6 (six) hours as needed for cough.   30 capsule   0   . citalopram (CELEXA) 20 MG tablet   Oral   Take 20 mg by mouth daily.         . fluconazole (DIFLUCAN) 150 MG tablet   Oral   Take 1 tablet (150 mg total) by mouth once.   1 tablet   0   . HYDROcodone-acetaminophen (NORCO) 5-325 MG tablet   Oral   Take 1 tablet by mouth 2 (two) times daily as needed for moderate pain.   10 tablet   0   . metFORMIN (GLUCOPHAGE) 500 MG tablet   Oral   Take 1 tablet (500 mg total) by mouth 2 (two) times daily with a meal.   60 tablet   11   . sulfamethoxazole-trimethoprim (BACTRIM DS,SEPTRA DS) 800-160 MG tablet   Oral   Take 2 tablets by mouth 2 (two) times daily.   20 tablet   0   . traMADol  (ULTRAM) 50 MG tablet   Oral   Take 1 tablet (50 mg total) by mouth every 6 (six) hours as needed.   12 tablet   0    Allergies Iodinated diagnostic agents  No family history on file.  Social History Social History  Substance Use Topics  . Smoking status: Current Every Day Smoker -- 0.50 packs/day    Types: Cigarettes  . Smokeless tobacco: None  . Alcohol Use: No   Review of Systems  Constitutional: Negative for fever. Cardiovascular: Negative for chest pain. Respiratory: Negative for shortness of breath. Genitourinary: Negative for dysuria. Vulvar abscess as above. Musculoskeletal: Negative for back pain. Skin: Negative for rash. Neurological: Negative for headaches, focal weakness or numbness. ____________________________________________  PHYSICAL EXAM:  VITAL SIGNS: ED Triage Vitals  Enc Vitals Group     BP 03/27/15 1819 110/60 mmHg     Pulse Rate 03/27/15 1819 92     Resp 03/27/15 1819 18     Temp 03/27/15 1819 97.9 F (36.6 C)     Temp Source 03/27/15 1819 Oral     SpO2 03/27/15 1819 100 %     Weight 03/27/15 1813 180 lb (81.647  kg)     Height 03/27/15 1813 5\' 7"  (1.702 m)     Head Cir --      Peak Flow --      Pain Score 03/27/15 1813 10     Pain Loc --      Pain Edu? --      Excl. in [place]? --    Constitutional: Alert and oriented. Well appearing and in no distress. Head: Normocephalic and atraumatic.      Eyes: Conjunctivae are normal. PERRL. Normal extraocular movements      Ears: Canals clear. TMs intact bilaterally.   Nose: No congestion/rhinorrhea.   Mouth/Throat: Mucous membranes are moist.   Neck: Supple. No thyromegaly. Hematological/Lymphatic/Immunological: No cervical lymphadenopathy. Cardiovascular: Normal rate, regular rhythm.  Respiratory: Normal respiratory effort. No wheezes/rales/rhonchi. Gastrointestinal: Soft and nontender. No distention. Musculoskeletal: Nontender with normal range of motion in all extremities.   Neurologic:  Normal gait without ataxia. Normal speech and language. No gross focal neurologic deficits are appreciated. Skin:  Skin is warm, dry and intact. No rash noted. Psychiatric: Mood and affect are normal. Patient exhibits appropriate insight and judgment. ____________________________________________  PROCEDURES  Ultram 50 mg PO Bactrim DS   INCISION AND DRAINAGE Performed by: Melvenia Needles Consent: Verbal consent obtained. Risks and benefits: risks, benefits and alternatives were discussed Type: abscess  Body area: left labia  Anesthesia: local infiltration  Incision was made with a scalpel.  Local anesthetic: lidocaine 1% w/ epinephrine  Anesthetic total: 6 ml  Complexity: complex Blunt dissection to break up loculations  Drainage: purulent  Drainage amount: small  Packing material: 1/4 in iodoform gauze  Patient tolerance: Patient tolerated the procedure well with no immediate complications. ____________________________________________  INITIAL IMPRESSION / ASSESSMENT AND PLAN / ED COURSE  Patient with left vulvar abscess status post I&D procedure. Patient is discharged with wound care and is post I&D instructions. She is given a prescription for Bactrim and Vicodin # 10 to dose as directed. She will continue to apply warm compresses to promote healing. She will remove the packing in 2-3 days or return to the [facility] for ongoing wound care. Return to the ED as needed for acutely worsening symptoms. ____________________________________________  FINAL CLINICAL IMPRESSION(S) / ED DIAGNOSES  Final diagnoses:  Vulvar abscess  Encounter for incision and drainage procedure      Melvenia Needles, PA-C 03/27/15 2146  Lisa Roca, MD 03/28/15 [phone]

## 2015-03-27 NOTE — Discharge Instructions (Signed)
Abscess °An abscess (boil or furuncle) is an infected area on or under the skin. This area is filled with yellowish-white fluid (pus) and other material (debris). °HOME CARE  °· Only take medicines as told by your doctor. °· If you were given antibiotic medicine, take it as directed. Finish the medicine even if you start to feel better. °· If gauze is used, follow your doctor's directions for changing the gauze. °· To avoid spreading the infection: °· Keep your abscess covered with a bandage. °· Wash your hands well. °· Do not share personal care items, towels, or whirlpools with others. °· Avoid skin contact with others. °· Keep your skin and clothes clean around the abscess. °· Keep all doctor visits as told. °GET HELP RIGHT AWAY IF:  °· You have more pain, puffiness (swelling), or redness in the wound site. °· You have more fluid or blood coming from the wound site. °· You have muscle aches, chills, or you feel sick. °· You have a fever. °MAKE SURE YOU:  °· Understand these instructions. °· Will watch your condition. °· Will get help right away if you are not doing well or get worse. °  °This information is not intended to replace advice given to you by your health care provider. Make sure you discuss any questions you have with your health care provider. °  °Document Released: 07/16/2007 Document Revised: 07/29/2011 Document Reviewed: 04/12/2011 °Elsevier Interactive Patient Education ©2016 Elsevier Inc[employer] ° °Incision and Drainage °Incision and drainage is a procedure in which a sac-like structure (cystic structure) is opened and drained. The area to be drained usually contains material such as pus, fluid, or blood.  °LET YOUR CAREGIVER KNOW ABOUT:  °· Allergies to medicine. °· Medicines taken, including vitamins, herbs, eyedrops, over-the-counter medicines, and creams. °· Use of steroids (by mouth or creams). °· Previous problems with anesthetics or numbing medicines. °· History of bleeding problems or blood  clots. °· Previous surgery. °· Other health problems, including diabetes and kidney problems. °· Possibility of pregnancy, if this applies. °RISKS AND COMPLICATIONS °· Pain. °· Bleeding. °· Scarring. °· Infection. °BEFORE THE PROCEDURE  °You may need to have an ultrasound or other imaging tests to see how large or deep your cystic structure is. Blood tests may also be used to determine if you have an infection or how severe the infection is. You may need to have a tetanus shot. °PROCEDURE  °The affected area is cleaned with a cleaning fluid. The cyst area will then be numbed with a medicine (local anesthetic). A small incision will be made in the cystic structure. A syringe or catheter may be used to drain the contents of the cystic structure, or the contents may be squeezed out. The area will then be flushed with a cleansing solution. After cleansing the area, it is often gently packed with a gauze or another wound dressing. Once it is packed, it will be covered with gauze and tape or some other type of wound dressing.  °AFTER THE PROCEDURE  °· Often, you will be allowed to go home right after the procedure. °· You may be given antibiotic medicine to prevent or heal an infection. °· If the area was packed with gauze or some other wound dressing, you will likely need to come back in 1 to 2 days to get it removed. °· The area should heal in about 14 days. °  °This information is not intended to replace advice given to you by your health care provider.   Make sure you discuss any questions you have with your health care provider.   Document Released: 07/23/2000 Document Revised: 07/29/2011 Document Reviewed: 03/24/2011 Elsevier Interactive Patient Education 2016 [place] the area clean and covered. Apply warm compresses to promote healing. Remove the packing or follow-up with [facility] in 2-3 days for packing removal. Take the antibiotics as directed.

## 2015-03-27 NOTE — ED Notes (Signed)
Boil to left inner buttock.

## 2015-07-26 ENCOUNTER — Encounter: Payer: Self-pay | Admitting: Urgent Care

## 2015-07-26 DIAGNOSIS — E119 Type 2 diabetes mellitus without complications: Secondary | ICD-10-CM | POA: Insufficient documentation

## 2015-07-26 DIAGNOSIS — Z792 Long term (current) use of antibiotics: Secondary | ICD-10-CM | POA: Insufficient documentation

## 2015-07-26 DIAGNOSIS — Z79899 Other long term (current) drug therapy: Secondary | ICD-10-CM | POA: Insufficient documentation

## 2015-07-26 DIAGNOSIS — Z7984 Long term (current) use of oral hypoglycemic drugs: Secondary | ICD-10-CM | POA: Insufficient documentation

## 2015-07-26 DIAGNOSIS — F17210 Nicotine dependence, cigarettes, uncomplicated: Secondary | ICD-10-CM | POA: Insufficient documentation

## 2015-07-26 DIAGNOSIS — A5901 Trichomonal vulvovaginitis: Secondary | ICD-10-CM | POA: Insufficient documentation

## 2015-07-26 NOTE — ED Notes (Addendum)
Patient presents with c/o extreme vaginal pain with heavy vaginal discharge x 2 days. Patient unable to sit secondary to intense pain. Entire vagina burns when she voids.

## 2015-07-27 ENCOUNTER — Emergency Department
Admission: EM | Admit: 2015-07-27 | Discharge: 2015-07-27 | Disposition: A | Discharge status: Home / Self Care | Payer: Self-pay | Attending: Emergency Medicine | Admitting: Emergency Medicine

## 2015-07-27 DIAGNOSIS — A5901 Trichomonal vulvovaginitis: Secondary | ICD-10-CM

## 2015-07-27 HISTORY — DX: Leiomyoma of uterus, unspecified: D25.9

## 2015-07-27 HISTORY — DX: Diverticulosis of intestine, part unspecified, without perforation or abscess without bleeding: K57.90

## 2015-07-27 LAB — CHLAMYDIA/NGC RT PCR (ARMC ONLY)
Chlamydia Tr: NOT DETECTED
N gonorrhoeae: NOT DETECTED

## 2015-07-27 LAB — URINALYSIS COMPLETE WITH MICROSCOPIC (ARMC ONLY)
Bilirubin Urine: NEGATIVE
Glucose, UA: >500 mg/dL — AB
KETONES UR: NEGATIVE mg/dL
Nitrite: NEGATIVE
PH: 5 (ref 5.0–8.0)
PROTEIN: NEGATIVE mg/dL
SPECIFIC GRAVITY, URINE: 1.033 — AB (ref 1.005–1.030)

## 2015-07-27 LAB — PREGNANCY, URINE: PREG TEST UR: NEGATIVE

## 2015-07-27 LAB — WET PREP, GENITAL
CLUE CELLS WET PREP: NONE SEEN
Sperm: NONE SEEN
Yeast Wet Prep HPF POC: NONE SEEN

## 2015-07-27 LAB — RAPID HIV SCREEN (HIV 1/2 AB+AG)
HIV 1/2 ANTIBODIES: NONREACTIVE
HIV-1 P24 ANTIGEN - HIV24: NONREACTIVE

## 2015-07-27 MED ORDER — LIDOCAINE VISCOUS 2 % MT SOLN
15.0000 mL | Freq: Once | OROMUCOSAL | Status: AC
  Administered 2015-07-27: 15 mL via OROMUCOSAL
  Filled 2015-07-27: qty 15

## 2015-07-27 MED ORDER — METRONIDAZOLE 500 MG PO TABS: 500.0000 mg | ORAL_TABLET | Freq: Two times a day (BID) | ORAL | Status: AC

## 2015-07-27 MED ORDER — METRONIDAZOLE 500 MG PO TABS
500.0000 mg | ORAL_TABLET | Freq: Once | ORAL | Status: AC
  Administered 2015-07-27: 500 mg via ORAL
  Filled 2015-07-27: qty 1

## 2015-07-27 NOTE — ED Notes (Signed)
RN in to assess with PA-C. See PA-C note.

## 2015-07-27 NOTE — ED Provider Notes (Signed)
[facility] Emergency Department Provider Note  ____________________________________________  Time seen: 1:15 AM  I have reviewed the triage vital signs and the nursing notes.   HISTORY  Chief Complaint Vaginal Discharge     HPI Julie Lambert is a 50 y.o. female presents with vaginal discharge 2 days as well as vaginal discomfort. Patient admits to multiple sexual partners in the past 39 months old which were unprotected. Patient admits to recent diagnosis of Trichomonas for which she was treated however she continues to have sexual intercourse with same individuals that she was prior to being diagnosed.     Past Medical History  Diagnosis Date  . Diabetes mellitus without complication ([place])   . Diverticulosis   . Uterine fibroid     There are no active problems to display for this patient.   Past Surgical History  Procedure Laterality Date  . Cholecystectomy    . Lumbar spine surgery    . Back surgery      Current Outpatient Rx  Name  Route  Sig  Dispense  Refill  . ALPRAZolam (XANAX) 0.5 MG tablet   Oral   Take 0.5 mg by mouth at bedtime as needed for anxiety.         . citalopram (CELEXA) 20 MG tablet   Oral   Take 20 mg by mouth daily.         . fluconazole (DIFLUCAN) 150 MG tablet   Oral   Take 1 tablet (150 mg total) by mouth once.   1 tablet   0   . HYDROcodone-acetaminophen (NORCO) 5-325 MG tablet   Oral   Take 1 tablet by mouth 2 (two) times daily as needed for moderate pain.   10 tablet   0   . metFORMIN (GLUCOPHAGE) 500 MG tablet   Oral   Take 1 tablet (500 mg total) by mouth 2 (two) times daily with a meal.   60 tablet   11   . sulfamethoxazole-trimethoprim (BACTRIM DS,SEPTRA DS) 800-160 MG tablet   Oral   Take 2 tablets by mouth 2 (two) times daily.   20 tablet   0     Allergies Iodinated diagnostic agents  No family history on file.  Social History Social History  Substance Use Topics  .  Smoking status: Current Every Day Smoker -- 0.50 packs/day    Types: Cigarettes  . Smokeless tobacco: None  . Alcohol Use: Yes    Review of Systems  Constitutional: Negative for fever. Eyes: Negative for visual changes. ENT: Negative for sore throat. Cardiovascular: Negative for chest pain. Respiratory: Negative for shortness of breath. Gastrointestinal: Negative for abdominal pain, vomiting and diarrhea. Genitourinary: Negative for dysuria.Positive vaginal discharge Musculoskeletal: Negative for back pain. Skin: Negative for rash. Neurological: Negative for headaches, focal weakness or numbness.   10-point ROS otherwise negative.  ____________________________________________   PHYSICAL EXAM:  VITAL SIGNS: ED Triage Vitals  Enc Vitals Group     BP 07/26/15 2232 140/81 mmHg     Pulse Rate 07/26/15 2232 98     Resp 07/26/15 2232 18     Temp 07/26/15 2232 98.3 F (36.8 C)     Temp Source 07/26/15 2232 Oral     SpO2 07/26/15 2232 100 %     Weight 07/26/15 2232 180 lb (81.647 kg)     Height --      Head Cir --      Peak Flow --      Pain Score 07/26/15  2233 10     Pain Loc --      Pain Edu? --      Excl. in [place]? --      Constitutional: Alert and oriented. Well appearing and in no distress. Eyes: Conjunctivae are normal. PERRL. Normal extraocular movements. ENT   Head: Normocephalic and atraumatic.   Nose: No congestion/rhinnorhea.   Mouth/Throat: Mucous membranes are moist.   Neck: No stridor. Hematological/Lymphatic/Immunilogical: No cervical lymphadenopathy. Cardiovascular: Normal rate, regular rhythm. Normal and symmetric distal pulses are present in all extremities. No murmurs, rubs, or gallops. Respiratory: Normal respiratory effort without tachypnea nor retractions. Breath sounds are clear and equal bilaterally. No wheezes/rales/rhonchi. Gastrointestinal: Soft and nontender. No distention. There is no CVA tenderness. Genitourinary: Copious  yellowish Jaxson Keener vaginal discharge noted, frothy Musculoskeletal: Nontender with normal range of motion in all extremities. No joint effusions.  No lower extremity tenderness nor edema. Neurologic:  Normal speech and language. No gross focal neurologic deficits are appreciated. Speech is normal.  Skin:  Skin is warm, dry and intact. No rash noted. Psychiatric: Mood and affect are normal. Speech and behavior are normal. Patient exhibits appropriate insight and judgment.  ____________________________________________    LABS (pertinent positives/negatives)  Labs Reviewed  WET PREP, GENITAL - Abnormal; Notable for the following:    Trich, Wet Prep PRESENT (*)    WBC, Wet Prep HPF POC MANY (*)    All other components within normal limits  URINALYSIS COMPLETEWITH MICROSCOPIC ([facility] ONLY) - Abnormal; Notable for the following:    Color, Urine STRAW (*)    APPearance HAZY (*)    Glucose, UA >500 (*)    Specific Gravity, Urine 1.033 (*)    Hgb urine dipstick 2+ (*)    Leukocytes, UA 2+ (*)    Bacteria, UA RARE (*)    Squamous Epithelial / LPF 0-5 (*)    All other components within normal limits  CHLAMYDIA/NGC RT PCR ([facility] ONLY)  PREGNANCY, URINE  RAPID HIV SCREEN (HIV 1/2 AB+AG)  RPR           INITIAL IMPRESSION / ASSESSMENT AND PLAN / ED COURSE  Pertinent labs & imaging results that were available during my care of the patient were reviewed by me and considered in my medical decision making (see chart for details).    ____________________________________________   FINAL CLINICAL IMPRESSION(S) / ED DIAGNOSES  Final diagnoses:  Trichomoniasis of vagina      Gregor Hams, MD 07/27/15 [phone]

## 2015-07-27 NOTE — Discharge Instructions (Signed)
Trichomoniasis Trichomoniasis is an infection caused by an organism called Trichomonas. The infection can affect both women and men. In women, the outer female genitalia and the vagina are affected. In men, the penis is mainly affected, but the prostate and other reproductive organs can also be involved. Trichomoniasis is a sexually transmitted infection (STI) and is most often passed to another person through sexual contact.  RISK FACTORS  Having unprotected sexual intercourse.  Having sexual intercourse with an infected partner. SIGNS AND SYMPTOMS  Symptoms of trichomoniasis in women include:  Abnormal gray-green frothy vaginal discharge.  Itching and irritation of the vagina.  Itching and irritation of the area outside the vagina. Symptoms of trichomoniasis in men include:   Penile discharge with or without pain.  Pain during urination. This results from inflammation of the urethra. DIAGNOSIS  Trichomoniasis may be found during a Pap test or physical exam. Your health care provider may use one of the following methods to help diagnose this infection:  Testing the pH of the vagina with a test tape.  Using a vaginal swab test that checks for the Trichomonas organism. A test is available that provides results within a few minutes.  Examining a urine sample.  Testing vaginal secretions. Your health care provider may test you for other STIs, including HIV. TREATMENT   You may be given medicine to fight the infection. Women should inform their health care provider if they could be or are pregnant. Some medicines used to treat the infection should not be taken during pregnancy.  Your health care provider may recommend over-the-counter medicines or creams to decrease itching or irritation.  Your sexual partner will need to be treated if infected.  Your health care provider may test you for infection again 3 months after treatment. HOME CARE INSTRUCTIONS   Take medicines only as  directed by your health care provider.  Take over-the-counter medicine for itching or irritation as directed by your health care provider.  Do not have sexual intercourse while you have the infection.  Women should not douche or wear tampons while they have the infection.  Discuss your infection with your partner. Your partner may have gotten the infection from you, or you may have gotten it from your partner.  Have your sex partner get examined and treated if necessary.  Practice safe, informed, and protected sex.  See your health care provider for other STI testing. SEEK MEDICAL CARE IF:   You still have symptoms after you finish your medicine.  You develop abdominal pain.  You have pain when you urinate.  You have bleeding after sexual intercourse.  You develop a rash.  Your medicine makes you sick or makes you throw up (vomit). MAKE SURE YOU:  Understand these instructions.  Will watch your condition.  Will get help right away if you are not doing well or get worse.   This information is not intended to replace advice given to you by your health care provider. Make sure you discuss any questions you have with your health care provider.   Document Released: 07/23/2000 Document Revised: 02/17/2014 Document Reviewed: 11/08/2012 Elsevier Interactive Patient Education 2016 Elsevier Inc.  

## 2015-07-28 LAB — RPR: RPR: NONREACTIVE

## 2015-08-26 ENCOUNTER — Encounter: Payer: Self-pay | Admitting: Emergency Medicine

## 2015-08-26 ENCOUNTER — Emergency Department
Admission: EM | Admit: 2015-08-26 | Discharge: 2015-08-26 | Disposition: A | Discharge status: Home / Self Care | Payer: Self-pay | Attending: Emergency Medicine | Admitting: Emergency Medicine

## 2015-08-26 DIAGNOSIS — E1165 Type 2 diabetes mellitus with hyperglycemia: Secondary | ICD-10-CM | POA: Insufficient documentation

## 2015-08-26 DIAGNOSIS — A599 Trichomoniasis, unspecified: Secondary | ICD-10-CM | POA: Insufficient documentation

## 2015-08-26 DIAGNOSIS — Z79899 Other long term (current) drug therapy: Secondary | ICD-10-CM | POA: Insufficient documentation

## 2015-08-26 DIAGNOSIS — Z7984 Long term (current) use of oral hypoglycemic drugs: Secondary | ICD-10-CM | POA: Insufficient documentation

## 2015-08-26 DIAGNOSIS — F17210 Nicotine dependence, cigarettes, uncomplicated: Secondary | ICD-10-CM | POA: Insufficient documentation

## 2015-08-26 LAB — POCT PREGNANCY, URINE: Preg Test, Ur: NEGATIVE

## 2015-08-26 LAB — CBC
HEMATOCRIT: 41.9 % (ref 35.0–47.0)
Hemoglobin: 14.5 g/dL (ref 12.0–16.0)
MCH: 31 pg (ref 26.0–34.0)
MCHC: 34.6 g/dL (ref 32.0–36.0)
MCV: 89.6 fL (ref 80.0–100.0)
Platelets: 182 10*3/uL (ref 150–440)
RBC: 4.68 MIL/uL (ref 3.80–5.20)
RDW: 13.1 % (ref 11.5–14.5)
WBC: 9.8 10*3/uL (ref 3.6–11.0)

## 2015-08-26 LAB — BASIC METABOLIC PANEL
ANION GAP: 10 (ref 5–15)
BUN: 11 mg/dL (ref 6–20)
CHLORIDE: 100 mmol/L — AB (ref 101–111)
CO2: 21 mmol/L — ABNORMAL LOW (ref 22–32)
Calcium: 9.1 mg/dL (ref 8.9–10.3)
Creatinine, Ser: 0.73 mg/dL (ref 0.44–1.00)
Glucose, Bld: 567 mg/dL (ref 65–99)
POTASSIUM: 4.3 mmol/L (ref 3.5–5.1)
SODIUM: 131 mmol/L — AB (ref 135–145)

## 2015-08-26 LAB — GLUCOSE, CAPILLARY
GLUCOSE-CAPILLARY: 549 mg/dL — AB (ref 65–99)
GLUCOSE-CAPILLARY: 254 mg/dL — AB (ref 65–99)
GLUCOSE-CAPILLARY: 231 mg/dL — AB (ref 65–99)
Glucose-Capillary: 568 mg/dL (ref 65–99)

## 2015-08-26 LAB — URINALYSIS COMPLETE WITH MICROSCOPIC (ARMC ONLY)
BILIRUBIN URINE: NEGATIVE
KETONES UR: NEGATIVE mg/dL
NITRITE: NEGATIVE
Protein, ur: NEGATIVE mg/dL
SPECIFIC GRAVITY, URINE: 1.03 (ref 1.005–1.030)
Squamous Epithelial / LPF: NONE SEEN
pH: 5 (ref 5.0–8.0)

## 2015-08-26 LAB — WET PREP, GENITAL
CLUE CELLS WET PREP: NONE SEEN
SPERM: NONE SEEN
YEAST WET PREP: NONE SEEN

## 2015-08-26 LAB — CHLAMYDIA/NGC RT PCR (ARMC ONLY)
Chlamydia Tr: NOT DETECTED
N gonorrhoeae: NOT DETECTED

## 2015-08-26 MED ORDER — INSULIN ASPART 100 UNIT/ML ~~LOC~~ SOLN
10.0000 [IU] | Freq: Once | SUBCUTANEOUS | Status: AC
  Administered 2015-08-26: 10 [IU] via INTRAVENOUS
  Filled 2015-08-26: qty 10

## 2015-08-26 MED ORDER — SODIUM CHLORIDE 0.9 % IV BOLUS (SEPSIS)
1000.0000 mL | Freq: Once | INTRAVENOUS | Status: AC
  Administered 2015-08-26: 1000 mL via INTRAVENOUS

## 2015-08-26 MED ORDER — ONDANSETRON HCL 4 MG/2ML IJ SOLN
4.0000 mg | Freq: Once | INTRAMUSCULAR | Status: AC
  Administered 2015-08-26: 4 mg via INTRAVENOUS
  Filled 2015-08-26: qty 2

## 2015-08-26 MED ORDER — METRONIDAZOLE 500 MG PO TABS
2000.0000 mg | ORAL_TABLET | Freq: Once | ORAL | Status: AC
  Administered 2015-08-26: 2000 mg via ORAL
  Filled 2015-08-26: qty 4

## 2015-08-26 NOTE — ED Provider Notes (Signed)
[facility] Emergency Department Provider Note  Time seen: 11:43 AM  I have reviewed the triage vital signs and the nursing notes.   HISTORY  Chief Complaint Hyperglycemia and Urinary Tract Infection    HPI Julie Lambert is a 50 y.o. female with a past medical history of diabetes, who presents the emergency department with vaginal discharge. According to the patient for the past 3 days she has been experiencing vaginal discharge and discomfort. Patient states she has not been sexually active in greater then 1 month. Patient also states a small bump that is painful in her groin. Patient is diabetic but states she has not been taking her metformin recently. States today she has been feeling very weak and dehydrated, and since last night has been peeing extremely frequently. Denies dysuria.     Past Medical History  Diagnosis Date  . Diabetes mellitus without complication ([place])   . Diverticulosis   . Uterine fibroid     There are no active problems to display for this patient.   Past Surgical History  Procedure Laterality Date  . Cholecystectomy    . Lumbar spine surgery    . Back surgery      Current Outpatient Rx  Name  Route  Sig  Dispense  Refill  . ALPRAZolam (XANAX) 0.5 MG tablet   Oral   Take 0.5 mg by mouth at bedtime as needed for anxiety.         . citalopram (CELEXA) 20 MG tablet   Oral   Take 20 mg by mouth daily.         . fluconazole (DIFLUCAN) 150 MG tablet   Oral   Take 1 tablet (150 mg total) by mouth once.   1 tablet   0   . HYDROcodone-acetaminophen (NORCO) 5-325 MG tablet   Oral   Take 1 tablet by mouth 2 (two) times daily as needed for moderate pain.   10 tablet   0   . metFORMIN (GLUCOPHAGE) 500 MG tablet   Oral   Take 1 tablet (500 mg total) by mouth 2 (two) times daily with a meal.   60 tablet   11   . sulfamethoxazole-trimethoprim (BACTRIM DS,SEPTRA DS) 800-160 MG tablet   Oral   Take 2 tablets by  mouth 2 (two) times daily.   20 tablet   0     Allergies Iodinated diagnostic agents  No family history on file.  Social History Social History  Substance Use Topics  . Smoking status: Current Every Day Smoker -- 0.50 packs/day    Types: Cigarettes  . Smokeless tobacco: None  . Alcohol Use: Yes    Review of Systems Constitutional: Negative for fever and positive for generalized weakness Cardiovascular: Negative for chest pain. Respiratory: Negative for shortness of breath. Gastrointestinal: Negative for abdominal pain, vomiting and diarrhea. Genitourinary: Negative for dysuria. Positive urinary frequency. Musculoskeletal: Negative for back pain Neurological: Negative for headache 10-point ROS otherwise negative.  ____________________________________________   PHYSICAL EXAM:  VITAL SIGNS: ED Triage Vitals  Enc Vitals Group     BP 08/26/15 1044 125/85 mmHg     Pulse Rate 08/26/15 1044 88     Resp 08/26/15 1044 18     Temp 08/26/15 1044 97.6 F (36.4 C)     Temp Source 08/26/15 1044 Oral     SpO2 08/26/15 1044 100 %     Weight 08/26/15 1044 178 lb 3.2 oz (80.831 kg)     Height 08/26/15 1044  5\' 7"  (1.702 m)     Head Cir --      Peak Flow --      Pain Score 08/26/15 1044 9     Pain Loc --      Pain Edu? --      Excl. in [place]? --    Constitutional: Alert and oriented. Well appearing and in no distress. Eyes: Normal exam ENT   Head: Normocephalic and atraumatic.   Mouth/Throat: Mucous membranes are moist. Cardiovascular: Normal rate, regular rhythm. No murmur Respiratory: Normal respiratory effort without tachypnea nor retractions. Breath sounds are clear Gastrointestinal: Soft and nontender. No distention.   Musculoskeletal: Nontender with normal range of motion in all extremities. Neurologic:  Normal speech and language. No gross focal neurologic deficits Skin:  Skin is warm, dry and intact.  Psychiatric: Mood and affect are normal.   ____________________________________________   INITIAL IMPRESSION / ASSESSMENT AND PLAN / ED COURSE  Pertinent labs & imaging results that were available during my care of the patient were reviewed by me and considered in my medical decision making (see chart for details).  Patient presents to the emergency department with vaginal discharge. Patient's blood glucose in triage to be greater than 500. We will check labs, perform a pelvic examination, IV hydrate and treat with IV insulin. Patient agreeable to this plan. Patient is supposed be taking metformin states she has not done so recently.  Patient states she has not been sexually active since her last visit to the emergency department, and at that time the patient's gonorrhea and chlamydia tests were negative but she was positive for trichomoniasis. She states she completed her entire course of Flagyl as prescribed. We will repeat a pelvic examination as well as pelvic swabs. We will hold off on treatment for gonorrhea or chlamydia at this time as the patient states she has not been 6 active since her last negative result.  Patient's wet prep positive for trichomoniasis. We'll dose would 2000 mg of Flagyl 1 in the emergency department. Patient blood sugar down to 254. I discussed with the patient importance of taking her metformin twice a day every day. The patient understands and will do so. I discussed increasing oral fluids. Patient agreeable.  ____________________________________________   FINAL CLINICAL IMPRESSION(S) / ED DIAGNOSES  Hyperglycemia Vaginal discharge Trichomoniasis  Harvest Dark, MD 08/26/15 1450

## 2015-08-26 NOTE — Discharge Instructions (Signed)
Trichomonas Test The trichomonas test is done to diagnose trichomoniasis, an infection caused by an organism called Trichomonas. Trichomoniasis is a sexually transmitted infection (STI). In women, it causes vaginal infections. In men, it can cause the tube that carries urine (urethra) to become inflamed (urethritis). You may have this test as a part of a routine screening for STIs or if you have symptoms of trichomoniasis. To perform the test, your health care provider will take a sample of discharge. The sample is taken from the vagina or cervix in women and from the urethra in men. A urine sample can also be used for testing. RESULTS It is your responsibility to obtain your test results. Ask the lab or department performing the test when and how you will get your results. Contact your health care provider to discuss any questions you have about your results.  Meaning of Negative Test Results A negative test means you do not have trichomoniasis. Follow your health care provider's directions about any follow-up testing.  Meaning of Positive Test Results A positive test result means you have an active infection that needs to be treated with antibiotic medicine. All your current sexual partners must also be treated or it is likely you will get reinfected.  If your test is positive, your health care provider will start you on medicine and may advise you to:  Not have sexual intercourse until your infection has cleared up.  Use a latex condom properly every time you have sexual intercourse.  Limit the number of sexual partners you have. The more partners you have, the greater your risk of contracting trichomoniasis or another STI.  Tell all sexual partners about your infection so they can also be treated and to prevent reinfection.   This information is not intended to replace advice given to you by your health care provider. Make sure you discuss any questions you have with your health care  provider.   Document Released: 03/01/2004 Document Revised: 02/17/2014 Document Reviewed: 02/08/2013 Elsevier Interactive Patient Education [employer].

## 2015-08-26 NOTE — ED Notes (Addendum)
Pt reports knot on vagina that has been there for a while with pain to that area. Pt also c/o vaginal discharge that yellowish in color denies odor. Denies fever of abd pain. Also c/o of urinary frequency and urgency   Nurse checks pt's blood glucose per request. Blood glucose 549. Pt takes metformin but has not been taking it consistently

## 2015-08-26 NOTE — ED Notes (Signed)

## 2016-03-02 ENCOUNTER — Emergency Department
Admission: EM | Admit: 2016-03-02 | Discharge: 2016-03-02 | Disposition: A | Discharge status: Home / Self Care | Payer: Self-pay | Attending: Student in an Organized Health Care Education/Training Program | Admitting: Student in an Organized Health Care Education/Training Program

## 2016-03-02 ENCOUNTER — Encounter: Payer: Self-pay | Admitting: Emergency Medicine

## 2016-03-02 ENCOUNTER — Emergency Department: Payer: Self-pay

## 2016-03-02 DIAGNOSIS — H1032 Unspecified acute conjunctivitis, left eye: Secondary | ICD-10-CM

## 2016-03-02 DIAGNOSIS — F17210 Nicotine dependence, cigarettes, uncomplicated: Secondary | ICD-10-CM | POA: Insufficient documentation

## 2016-03-02 DIAGNOSIS — Z79899 Other long term (current) drug therapy: Secondary | ICD-10-CM | POA: Insufficient documentation

## 2016-03-02 DIAGNOSIS — H1089 Other conjunctivitis: Secondary | ICD-10-CM | POA: Insufficient documentation

## 2016-03-02 DIAGNOSIS — E119 Type 2 diabetes mellitus without complications: Secondary | ICD-10-CM | POA: Insufficient documentation

## 2016-03-02 DIAGNOSIS — Z7984 Long term (current) use of oral hypoglycemic drugs: Secondary | ICD-10-CM | POA: Insufficient documentation

## 2016-03-02 HISTORY — DX: Bipolar II disorder: F31.81

## 2016-03-02 LAB — GLUCOSE, CAPILLARY: GLUCOSE-CAPILLARY: 418 mg/dL — AB (ref 65–99)

## 2016-03-02 MED ORDER — HYDROCODONE-ACETAMINOPHEN 5-325 MG PO TABS: 1.0000 | ORAL_TABLET | Freq: Four times a day (QID) | ORAL | 0 refills | Status: DC | PRN

## 2016-03-02 MED ORDER — MOXIFLOXACIN HCL 0.5 % OP SOLN
1.0000 [drp] | Freq: Once | OPHTHALMIC | Status: AC
  Administered 2016-03-02: 1 [drp] via OPHTHALMIC
  Filled 2016-03-02 (×3): qty 3

## 2016-03-02 MED ORDER — FLUORESCEIN SODIUM 0.6 MG OP STRP
1.0000 | ORAL_STRIP | Freq: Once | OPHTHALMIC | Status: AC
  Administered 2016-03-02: 1 via OPHTHALMIC
  Filled 2016-03-02: qty 1

## 2016-03-02 MED ORDER — HYDROCODONE-ACETAMINOPHEN 5-325 MG PO TABS
1.0000 | ORAL_TABLET | Freq: Once | ORAL | Status: AC
  Administered 2016-03-02: 1 via ORAL
  Filled 2016-03-02: qty 1

## 2016-03-02 MED ORDER — TETRACAINE HCL 0.5 % OP SOLN
2.0000 [drp] | Freq: Once | OPHTHALMIC | Status: AC
  Administered 2016-03-02: 2 [drp] via OPHTHALMIC
  Filled 2016-03-02: qty 2

## 2016-03-02 MED ORDER — MOXIFLOXACIN HCL 0.5 % OP SOLN: 1.0000 [drp] | Freq: Three times a day (TID) | OPHTHALMIC | 0 refills | Status: AC

## 2016-03-02 NOTE — ED Notes (Signed)

## 2016-03-02 NOTE — ED Provider Notes (Signed)
[facility] Emergency Department Provider Note  ____________________________________________  Time seen: Approximately 6:39 PM  I have reviewed the triage vital signs and the nursing notes.   HISTORY  Chief Complaint Eye Pain    HPI Julie Lambert is a 51 y.o. female that presents to the emergency department with one day of left eye swelling, redness, photophobia, sharp pain, clear discharge, and headache. Patient states that eye was swelled shut this morning. Clear drainage present. Patient states that it is extremely painful every time she opens her eye.Patient states that the light hurts her eye. Patient wears contacts but did not bring them with her. Patient denies fever, nausea, vomiting.   Past Medical History:  Diagnosis Date  . Bipolar 2 disorder ([place])   . Diabetes mellitus without complication ([employer])   . Diverticulosis   . Uterine fibroid     There are no active problems to display for this patient.   Past Surgical History:  Procedure Laterality Date  . BACK SURGERY    . CHOLECYSTECTOMY    . LUMBAR SPINE SURGERY      Prior to Admission medications   Medication Sig Start Date End Date Taking? Authorizing Provider  citalopram (CELEXA) 20 MG tablet Take 20 mg by mouth daily.    Historical Provider, MD  fluconazole (DIFLUCAN) 150 MG tablet Take 1 tablet (150 mg total) by mouth once. 12/18/14   Lavonia Drafts, MD  HYDROcodone-acetaminophen (NORCO/VICODIN) 5-325 MG tablet Take 1 tablet by mouth every 6 (six) hours as needed for moderate pain. 03/02/16   Laban Emperor, PA-C  metFORMIN (GLUCOPHAGE) 500 MG tablet Take 1 tablet (500 mg total) by mouth 2 (two) times daily with a meal. 11/05/14 11/05/15  Daymon Larsen, MD  moxifloxacin (VIGAMOX) 0.5 % ophthalmic solution Place 1 drop into the left eye 3 (three) times daily. 03/02/16 03/09/16  Laban Emperor, PA-C  sulfamethoxazole-trimethoprim (BACTRIM DS,SEPTRA DS) 800-160 MG tablet Take 2 tablets by  mouth 2 (two) times daily. 03/27/15   Jenise V Bacon Menshew, PA-C    Allergies Iodinated diagnostic agents  No family history on file.  Social History Social History  Substance Use Topics  . Smoking status: Current Every Day Smoker    Packs/day: 0.50    Types: Cigarettes  . Smokeless tobacco: Not on file  . Alcohol use Yes     Review of Systems  Constitutional: No fever/chills ENT: Negative for congestion and rhinorrhea. Cardiovascular: No chest pain. Respiratory: Negative for cough. No SOB. Gastrointestinal: No abdominal pain.  No nausea, no vomiting.  No diarrhea.  No constipation. Musculoskeletal: Negative for musculoskeletal pain. Skin: Negative for rash, abrasions, lacerations, ecchymosis.    ____________________________________________   PHYSICAL EXAM:  VITAL SIGNS: ED Triage Vitals  Enc Vitals Group     BP 03/02/16 1153 118/79     Pulse Rate 03/02/16 1153 82     Resp 03/02/16 1153 20     Temp 03/02/16 1153 98.6 F (37 C)     Temp Source 03/02/16 1153 Oral     SpO2 03/02/16 1145 99 %     Weight 03/02/16 1147 180 lb (81.6 kg)     Height 03/02/16 1147 5\' 7"  (1.702 m)     Head Circumference --      Peak Flow --      Pain Score 03/02/16 1147 10     Pain Loc --      Pain Edu? --      Excl. in [place]? --  Constitutional: Alert and oriented. Well appearing and in no acute distress. Eyes: Conjunctivae are injected. PERRL. EOMI. Clear discharge present. Head: Atraumatic. ENT: No frontal and maxillary sinus tenderness.      Ears: Tympanic membranes pearly gray with good landmarks. No discharge.      Nose: No congestion/rhinnorhea.      Mouth/Throat: Mucous membranes are moist. Oropharynx non-erythematous. Tonsils not enlarged. No exudates. Uvula midline. Neck: No stridor.   Hematological/Lymphatic/Immunilogical: No cervical lymphadenopathy. Cardiovascular: Normal rate, regular rhythm. Normal S1 and S2.  Good peripheral circulation. Respiratory: Normal  respiratory effort without tachypnea or retractions. Lungs CTAB. Good air entry to the bases with no decreased or absent breath sounds. Gastrointestinal: Bowel sounds 4 quadrants. Soft and nontender to palpation. No guarding or rigidity. No palpable masses. No distention. Musculoskeletal: Full range of motion to all extremities. No gross deformities appreciated. Neurologic:  Normal speech and language. No gross focal neurologic deficits are appreciated.  Skin:  Skin is warm, dry and intact. No rash noted. Psychiatric: Mood and affect are normal. Speech and behavior are normal. Patient exhibits appropriate insight and judgement.   ____________________________________________   LABS (all labs ordered are listed, but only abnormal results are displayed)  Labs Reviewed  GLUCOSE, CAPILLARY - Abnormal; Notable for the following:       Result Value   Glucose-Capillary 418 (*)    All other components within normal limits   ____________________________________________  EKG   ____________________________________________  RADIOLOGY  Ct Orbits Wo Contrast  Result Date: 03/02/2016 CLINICAL DATA:  Acute presentation with left eye pain and redness. Worsening vision. EXAM: CT ORBITS WITHOUT CONTRAST TECHNIQUE: Multidetector CT images were obtained using the standard protocol without intravenous contrast. COMPARISON:  Head CT 01/02/2012 FINDINGS: Orbits: Both globes appear normal. Both optic nerves appear normal. Extra-ocular muscles appear normal. No postseptal abnormality on either side. There does appear to be some soft tissue swelling of the periorbital soft tissues anteriorly on the left. The nasolacrimal duct on the right is air-filled in the duct on the left is not. There could be dacryocystitis. Visualized sinuses: Regional sinuses are clear and normal. Soft tissues: No other soft tissue finding. Limited intracranial: Orbital apices and cavernous sinus regions appear within normal limits.  IMPRESSION: Nasolacrimal duct on the left is not air-filled. This could be seen with dacryocystitis. Apparent mild swelling of the periorbital soft tissues on the left. No postseptal orbital abnormality otherwise. Electronically Signed   By: Nelson Chimes M.D.   On: 03/02/2016 14:19    ____________________________________________    PROCEDURES  Procedure(s) performed:    Procedures    Medications  tetracaine (PONTOCAINE) 0.5 % ophthalmic solution 2 drop (2 drops Left Eye Given by Other 03/02/16 1442)  fluorescein ophthalmic strip 1 strip (1 strip Left Eye Given by Other 03/02/16 1443)  HYDROcodone-acetaminophen (NORCO/VICODIN) 5-325 MG per tablet 1 tablet (1 tablet Oral Given 03/02/16 1357)  moxifloxacin (VIGAMOX) 0.5 % ophthalmic solution 1 drop (1 drop Left Eye Given 03/02/16 1530)     ____________________________________________   INITIAL IMPRESSION / ASSESSMENT AND PLAN / ED COURSE  Pertinent labs & imaging results that were available during my care of the patient were reviewed by me and considered in my medical decision making (see chart for details).  Review of the [place] CSRS was performed in accordance of the [place] prior to dispensing any controlled drugs.  Patient's diagnosis is consistent with conjunctivitis. Patient presents to the emergency department with acute pain, conjunctival injection, and difficulty seeing. Unable to assess  visual acuity due to patient being unable to keep eye open. No abrasion seen on flouroscene stain. No cellulitis indicated on CT of orbits. Because of my concern for keratitis, I spoke with Dr. Edison Pace. His recommendation was to follow-up tomorrow in eye center if symptoms have not improved. Education about diabetes management was provided. Patient states that she does not have insurance and cannot afford any diabetic medications. Patient will be discharged home with prescriptions for moxifloxacin. Patient is to follow up with [facility] as needed or  otherwise directed. Patient is given ED precautions to return to the ED for any worsening or new symptoms.   ____________________________________________  FINAL CLINICAL IMPRESSION(S) / ED DIAGNOSES  Final diagnoses:  Acute bacterial conjunctivitis of left eye      NEW MEDICATIONS STARTED DURING THIS VISIT:  Discharge Medication List as of 03/02/2016  3:12 PM    START taking these medications   Details  moxifloxacin (VIGAMOX) 0.5 % ophthalmic solution Place 1 drop into the left eye 3 (three) times daily., Starting Sun 03/02/2016, Until Sun 03/09/2016, Print            This chart was dictated using voice recognition software/Dragon. Despite best efforts to proofread, errors can occur which can change the meaning. Any change was purely unintentional.    Laban Emperor, PA-C 03/03/16 [place], MD 03/03/16 [phone]

## 2016-03-02 NOTE — ED Triage Notes (Signed)
Pt presents to ED via [facility] for c/o L eye pain. Pt presents with significant redness to L eye, decreased vision, and clear drainage noted. Per EMS Pt CBG was 496, pt has a hx of diet controlled DM. Pt c/o pain when opening her eye at this time.

## 2016-03-23 ENCOUNTER — Emergency Department
Admission: EM | Admit: 2016-03-23 | Discharge: 2016-03-23 | Disposition: A | Discharge status: Home / Self Care | Payer: Self-pay | Attending: Emergency Medicine | Admitting: Emergency Medicine

## 2016-03-23 ENCOUNTER — Encounter: Payer: Self-pay | Admitting: Emergency Medicine

## 2016-03-23 ENCOUNTER — Emergency Department: Payer: Self-pay

## 2016-03-23 DIAGNOSIS — E1165 Type 2 diabetes mellitus with hyperglycemia: Secondary | ICD-10-CM | POA: Insufficient documentation

## 2016-03-23 DIAGNOSIS — F17210 Nicotine dependence, cigarettes, uncomplicated: Secondary | ICD-10-CM | POA: Insufficient documentation

## 2016-03-23 DIAGNOSIS — Y999 Unspecified external cause status: Secondary | ICD-10-CM | POA: Insufficient documentation

## 2016-03-23 DIAGNOSIS — Y929 Unspecified place or not applicable: Secondary | ICD-10-CM | POA: Insufficient documentation

## 2016-03-23 DIAGNOSIS — Z79899 Other long term (current) drug therapy: Secondary | ICD-10-CM | POA: Insufficient documentation

## 2016-03-23 DIAGNOSIS — R51 Headache: Secondary | ICD-10-CM

## 2016-03-23 DIAGNOSIS — W0110XA Fall on same level from slipping, tripping and stumbling with subsequent striking against unspecified object, initial encounter: Secondary | ICD-10-CM | POA: Insufficient documentation

## 2016-03-23 DIAGNOSIS — Y9301 Activity, walking, marching and hiking: Secondary | ICD-10-CM | POA: Insufficient documentation

## 2016-03-23 DIAGNOSIS — R519 Headache, unspecified: Secondary | ICD-10-CM

## 2016-03-23 DIAGNOSIS — R739 Hyperglycemia, unspecified: Secondary | ICD-10-CM

## 2016-03-23 LAB — BASIC METABOLIC PANEL
ANION GAP: 9 (ref 5–15)
Anion gap: 5 (ref 5–15)
BUN: 11 mg/dL (ref 6–20)
BUN: 12 mg/dL (ref 6–20)
CALCIUM: 8.5 mg/dL — AB (ref 8.9–10.3)
CHLORIDE: 104 mmol/L (ref 101–111)
CO2: 19 mmol/L — ABNORMAL LOW (ref 22–32)
CO2: 21 mmol/L — ABNORMAL LOW (ref 22–32)
CREATININE: 0.64 mg/dL (ref 0.44–1.00)
Calcium: 8.8 mg/dL — ABNORMAL LOW (ref 8.9–10.3)
Chloride: 111 mmol/L (ref 101–111)
Creatinine, Ser: 0.65 mg/dL (ref 0.44–1.00)
GFR calc Af Amer: >60 mL/min (ref >60)
Glucose, Bld: 361 mg/dL — ABNORMAL HIGH (ref 65–99)
Glucose, Bld: 215 mg/dL — ABNORMAL HIGH (ref 65–99)
POTASSIUM: 4.3 mmol/L (ref 3.5–5.1)
Potassium: 3.9 mmol/L (ref 3.5–5.1)
SODIUM: 132 mmol/L — AB (ref 135–145)
SODIUM: 137 mmol/L (ref 135–145)

## 2016-03-23 LAB — CBC WITH DIFFERENTIAL/PLATELET
BASOS ABS: 0.2 10*3/uL — AB (ref 0–0.1)
Basophils Relative: 1 %
EOS ABS: 0.1 10*3/uL (ref 0–0.7)
Eosinophils Relative: 1 %
HCT: 40.3 % (ref 35.0–47.0)
HEMOGLOBIN: 14.2 g/dL (ref 12.0–16.0)
LYMPHS ABS: 2.7 10*3/uL (ref 1.0–3.6)
Lymphocytes Relative: 15 %
MCH: 31.3 pg (ref 26.0–34.0)
MCHC: 35.2 g/dL (ref 32.0–36.0)
MCV: 88.8 fL (ref 80.0–100.0)
Monocytes Absolute: 0.9 10*3/uL (ref 0.2–0.9)
Monocytes Relative: 5 %
NEUTROS PCT: 78 %
Neutro Abs: 14 10*3/uL — ABNORMAL HIGH (ref 1.4–6.5)
PLATELETS: 256 10*3/uL (ref 150–440)
RBC: 4.53 MIL/uL (ref 3.80–5.20)
RDW: 13.5 % (ref 11.5–14.5)
WBC: 17.9 10*3/uL — AB (ref 3.6–11.0)

## 2016-03-23 LAB — TROPONIN I: TROPONIN I: 0.03 ng/mL — AB (ref <0.03)

## 2016-03-23 LAB — URINALYSIS, COMPLETE (UACMP) WITH MICROSCOPIC
BILIRUBIN URINE: NEGATIVE
Glucose, UA: >=500 mg/dL — AB
Hgb urine dipstick: NEGATIVE
KETONES UR: 5 mg/dL — AB
Nitrite: POSITIVE — AB
Protein, ur: NEGATIVE mg/dL
Specific Gravity, Urine: 1.035 — ABNORMAL HIGH (ref 1.005–1.030)
pH: 5 (ref 5.0–8.0)

## 2016-03-23 LAB — GLUCOSE, CAPILLARY
GLUCOSE-CAPILLARY: 433 mg/dL — AB (ref 65–99)
GLUCOSE-CAPILLARY: 267 mg/dL — AB (ref 65–99)
GLUCOSE-CAPILLARY: 246 mg/dL — AB (ref 65–99)
GLUCOSE-CAPILLARY: 246 mg/dL — AB (ref 65–99)

## 2016-03-23 LAB — INFLUENZA PANEL BY PCR (TYPE A & B)
INFLAPCR: NEGATIVE
Influenza B By PCR: NEGATIVE

## 2016-03-23 MED ORDER — KETOROLAC TROMETHAMINE 30 MG/ML IJ SOLN
30.0000 mg | Freq: Once | INTRAMUSCULAR | Status: AC
  Administered 2016-03-23: 30 mg via INTRAVENOUS
  Filled 2016-03-23: qty 1

## 2016-03-23 MED ORDER — ONDANSETRON HCL 4 MG/2ML IJ SOLN
4.0000 mg | Freq: Once | INTRAMUSCULAR | Status: AC
  Administered 2016-03-23: 4 mg via INTRAVENOUS
  Filled 2016-03-23: qty 2

## 2016-03-23 MED ORDER — ONDANSETRON HCL 4 MG/2ML IJ SOLN
4.0000 mg | INTRAMUSCULAR | Status: AC
  Administered 2016-03-23: 4 mg via INTRAVENOUS

## 2016-03-23 MED ORDER — DIPHENHYDRAMINE HCL 50 MG/ML IJ SOLN
25.0000 mg | Freq: Once | INTRAMUSCULAR | Status: AC
  Administered 2016-03-23: 25 mg via INTRAVENOUS
  Filled 2016-03-23: qty 1

## 2016-03-23 MED ORDER — SODIUM CHLORIDE 0.9 % IV BOLUS (SEPSIS)
1000.0000 mL | Freq: Once | INTRAVENOUS | Status: AC
  Administered 2016-03-23: 1000 mL via INTRAVENOUS

## 2016-03-23 MED ORDER — METFORMIN HCL 500 MG PO TABS: 500.0000 mg | ORAL_TABLET | Freq: Two times a day (BID) | ORAL | 0 refills | Status: DC

## 2016-03-23 MED ORDER — MORPHINE SULFATE (PF) 2 MG/ML IV SOLN
INTRAVENOUS | Status: AC
  Administered 2016-03-23: 4 mg via INTRAVENOUS
  Filled 2016-03-23: qty 2

## 2016-03-23 MED ORDER — MORPHINE SULFATE (PF) 2 MG/ML IV SOLN
4.0000 mg | INTRAVENOUS | Status: AC
  Administered 2016-03-23: 4 mg via INTRAVENOUS

## 2016-03-23 MED ORDER — ONDANSETRON HCL 4 MG/2ML IJ SOLN
INTRAMUSCULAR | Status: AC
  Administered 2016-03-23: 4 mg via INTRAVENOUS
  Filled 2016-03-23: qty 2

## 2016-03-23 MED ORDER — DEXTROSE-NACL 5-0.45 % IV SOLN
INTRAVENOUS | Status: DC
  Administered 2016-03-23: 13:00:00 via INTRAVENOUS

## 2016-03-23 MED ORDER — CIPROFLOXACIN HCL 500 MG PO TABS: 500.0000 mg | ORAL_TABLET | Freq: Two times a day (BID) | ORAL | 0 refills | Status: AC

## 2016-03-23 MED ORDER — SODIUM CHLORIDE 0.9 % IV SOLN
INTRAVENOUS | Status: DC
  Administered 2016-03-23: 2.1 [IU]/h via INTRAVENOUS
  Filled 2016-03-23: qty 2.5

## 2016-03-23 NOTE — ED Notes (Signed)
AAOx3.  Skin warm and dry.  Ambulating independently in waiting room. Posture upright and relaxed.  Moving all extremities equally and strong.  Gait steady.  NAD

## 2016-03-23 NOTE — ED Notes (Signed)
Upon walking to triage room 1, pt c/o blurred vision. Pt in NAD. Pt alert and oriented X4, active, cooperative, pt in NAD. RR even and unlabored, color WNL.  Spoke with MD who gave verbal orders.

## 2016-03-23 NOTE — ED Triage Notes (Signed)
Fell last Saturday and hit head.  Tripped while walking on sidewalk.  No LOC.  Had been feeling better, but headache returned yesterday.  Took tylenol at home with no relief.  C/O generalized headache.

## 2016-03-23 NOTE — ED Notes (Signed)
Elevated troponin MD made aware

## 2016-03-23 NOTE — ED Notes (Addendum)
Pt hypertensive, MD made aware. 

## 2016-03-23 NOTE — ED Provider Notes (Addendum)
[facility] Emergency Department Provider Note ____________________________________________   I have reviewed the triage vital signs and the triage nursing note.  HISTORY  Chief Complaint Headache   Historian Patient  HPI Julie Lambert is a 51 y.o. female with a history of diabetes as well as bipolar, states that she has not had insurance and has therefore not been able to fill the metformin for quite some time, presents today with several days of headache. Gradual onset. No fevers. Mild nausea without vomiting. Denies vision changes. Denies cough congestion, chest pain, or trouble breathing. Headache is moderate to severe. Global in nature.  Reports tripping near her house about a week ago striking her head. No evaluation at that time.   Past Medical History:  Diagnosis Date  . Bipolar 2 disorder ([place])   . Diabetes mellitus without complication ([place])   . Diverticulosis   . Uterine fibroid     There are no active problems to display for this patient.   Past Surgical History:  Procedure Laterality Date  . BACK SURGERY    . CHOLECYSTECTOMY    . LUMBAR SPINE SURGERY      Prior to Admission medications   Medication Sig Start Date End Date Taking? Authorizing Provider  citalopram (CELEXA) 20 MG tablet Take 20 mg by mouth daily.   Yes Historical Provider, MD  HYDROcodone-acetaminophen (NORCO/VICODIN) 5-325 MG tablet Take 1 tablet by mouth every 6 (six) hours as needed for moderate pain. Patient not taking: Reported on 03/23/2016 03/02/16   Laban Emperor, PA-C  metFORMIN (GLUCOPHAGE) 500 MG tablet Take 1 tablet (500 mg total) by mouth 2 (two) times daily with a meal. 11/05/14 11/05/15  Daymon Larsen, MD    Allergies  Allergen Reactions  . Iodinated Diagnostic Agents Itching    Patient stated she had itching on her back after IV injection    No family history on file.  Social History Social History  Substance Use Topics  . Smoking status:  Current Every Day Smoker    Packs/day: 0.50    Types: Cigarettes  . Smokeless tobacco: Never Used  . Alcohol use Yes    Review of Systems  Constitutional: Negative for fever. Eyes: Negative for visual changes. ENT: Negative for sore throat. Cardiovascular: Negative for chest pain. Respiratory: Negative for shortness of breath. Gastrointestinal: Negative for abdominal pain, vomiting and diarrhea. Genitourinary: Negative for dysuria. Musculoskeletal: Negative for back pain. Skin: Negative for rash. Neurological: Positive for headache. 10 point Review of Systems otherwise negative ____________________________________________   PHYSICAL EXAM:  VITAL SIGNS: ED Triage Vitals  Enc Vitals Group     BP 03/23/16 0718 137/85     Pulse Rate 03/23/16 0718 (!) 105     Resp 03/23/16 0718 (!) 22     Temp 03/23/16 0718 98 F (36.7 C)     Temp Source 03/23/16 0718 Oral     SpO2 03/23/16 0718 99 %     Weight 03/23/16 0711 180 lb (81.6 kg)     Height 03/23/16 0711 5\' 7"  (1.702 m)     Head Circumference --      Peak Flow --      Pain Score 03/23/16 0711 10     Pain Loc --      Pain Edu? --      Excl. in [place]? --      Constitutional: Alert and oriented. Well appearing and in no distress. HEENT   Head: Normocephalic and atraumatic.  Eyes: Conjunctivae are normal. PERRL. Normal extraocular movements.      Ears:         Nose: No congestion/rhinnorhea.   Mouth/Throat: Mucous membranes are moist.   Neck: No stridor. Cardiovascular/Chest: Normal rate, regular rhythm.  No murmurs, rubs, or gallops. Respiratory: Normal respiratory effort without tachypnea nor retractions. Breath sounds are clear and equal bilaterally. No wheezes/rales/rhonchi. Gastrointestinal: Soft. No distention, no guarding, no rebound. Nontender.    Genitourinary/rectal:Deferred Musculoskeletal: Nontender with normal range of motion in all extremities. No joint effusions.  No lower extremity tenderness.  No  edema. Neurologic:  Normal speech and language. No gross or focal neurologic deficits are appreciated. Skin:  Skin is warm, dry and intact. No rash noted. Psychiatric: Mood and affect are normal. Speech and behavior are normal. Patient exhibits appropriate insight and judgment.   ____________________________________________  LABS (pertinent positives/negatives)  Labs Reviewed  GLUCOSE, CAPILLARY - Abnormal; Notable for the following:       Result Value   Glucose-Capillary 433 (*)    All other components within normal limits  CBC WITH DIFFERENTIAL/PLATELET - Abnormal; Notable for the following:    WBC 17.9 (*)    Neutro Abs 14.0 (*)    Basophils Absolute 0.2 (*)    All other components within normal limits  BASIC METABOLIC PANEL - Abnormal; Notable for the following:    Sodium 132 (*)    CO2 19 (*)    Glucose, Bld 361 (*)    Calcium 8.8 (*)    All other components within normal limits  TROPONIN I - Abnormal; Notable for the following:    Troponin I 0.03 (*)    All other components within normal limits  GLUCOSE, CAPILLARY - Abnormal; Notable for the following:    Glucose-Capillary 267 (*)    All other components within normal limits  GLUCOSE, CAPILLARY - Abnormal; Notable for the following:    Glucose-Capillary 246 (*)    All other components within normal limits  GLUCOSE, CAPILLARY - Abnormal; Notable for the following:    Glucose-Capillary 246 (*)    All other components within normal limits  BASIC METABOLIC PANEL  INFLUENZA PANEL BY PCR (TYPE A & B)  URINALYSIS, COMPLETE (UACMP) WITH MICROSCOPIC    ____________________________________________    EKG I, Lisa Roca, MD, the attending physician have personally viewed and interpreted all ECGs.  90 bpm. Sinus rhythm. Left axis deviation. Normal ST and T-wave ____________________________________________  RADIOLOGY All Xrays were viewed by me. Imaging interpreted by Radiologist.  CT without contrast: Normal head  CT. __________________________________________  PROCEDURES  Procedure(s) performed: None  Critical Care performed: None  ____________________________________________   ED COURSE / ASSESSMENT AND PLAN  Pertinent labs & imaging results that were available during my care of the patient were reviewed by me and considered in my medical decision making (see chart for details).   Ms. Lagrange is here with a complaint of headache which seems like a nonspecific headache, however given history of recent trauma without evaluation, head CT was obtained and this was negative for traumatic finding. Denies recent infectious illness symptoms. No stiff neck. Not suspicious for meningitis clinically. She is found to have elevated blood sugar along with low CO2, started on insulin drip.  Given the fact that she is unable to obtain the prescriptions due to loss of insurance, social situation and financial situation, associated with hyperglycemia, patient will need to be admitted to the hospital for further management.  I discussed with Dr. Margaretmary Eddy, Hospitalist, without anion  gap, not DKA - no indication for hospital admission.  I am going to d/c insulin drip, repeat BMP, add on UA and CXR and flu swab for further investigation of elevated wbc.  I will consult social work to provide resources in terms of medication management.  I will write rx for her to start back on metformin.  Again, no symptoms concerning for meningitis.  Patient care transferred to Dr. Jimmye Norman at shift change 3:30pm.  Above studies and consult pending.    CONSULTATIONS:  Hospitalist.   Patient / Family / Caregiver informed of clinical course, medical decision-making process, and agree with plan.  ___________________________________________   FINAL CLINICAL IMPRESSION(S) / ED DIAGNOSES   Final diagnoses:  Acute nonintractable headache, unspecified headache type  Hyperglycemia              Note: This dictation was  prepared with Dragon dictation. Any transcriptional errors that result from this process are unintentional    Lisa Roca, MD 03/23/16 1437    Lisa Roca, MD 03/23/16 [phone]

## 2016-03-24 ENCOUNTER — Emergency Department
Admission: EM | Admit: 2016-03-24 | Discharge: 2016-03-24 | Disposition: A | Discharge status: Home / Self Care | Payer: Self-pay | Attending: Emergency Medicine | Admitting: Emergency Medicine

## 2016-03-24 DIAGNOSIS — F17210 Nicotine dependence, cigarettes, uncomplicated: Secondary | ICD-10-CM | POA: Insufficient documentation

## 2016-03-24 DIAGNOSIS — R519 Headache, unspecified: Secondary | ICD-10-CM

## 2016-03-24 DIAGNOSIS — R51 Headache: Secondary | ICD-10-CM

## 2016-03-24 DIAGNOSIS — Z7984 Long term (current) use of oral hypoglycemic drugs: Secondary | ICD-10-CM | POA: Insufficient documentation

## 2016-03-24 DIAGNOSIS — E1165 Type 2 diabetes mellitus with hyperglycemia: Secondary | ICD-10-CM

## 2016-03-24 LAB — COMPREHENSIVE METABOLIC PANEL
ALBUMIN: 3.1 g/dL — AB (ref 3.5–5.0)
ALT: 16 U/L (ref 14–54)
ANION GAP: 8 (ref 5–15)
AST: 21 U/L (ref 15–41)
Alkaline Phosphatase: 103 U/L (ref 38–126)
BUN: 10 mg/dL (ref 6–20)
CHLORIDE: 107 mmol/L (ref 101–111)
CO2: 22 mmol/L (ref 22–32)
Calcium: 8.8 mg/dL — ABNORMAL LOW (ref 8.9–10.3)
Creatinine, Ser: 0.65 mg/dL (ref 0.44–1.00)
GFR calc non Af Amer: >60 mL/min (ref >60)
GLUCOSE: 329 mg/dL — AB (ref 65–99)
Potassium: 4 mmol/L (ref 3.5–5.1)
SODIUM: 137 mmol/L (ref 135–145)
Total Bilirubin: 0.2 mg/dL — ABNORMAL LOW (ref 0.3–1.2)
Total Protein: 6.2 g/dL — ABNORMAL LOW (ref 6.5–8.1)

## 2016-03-24 LAB — URINALYSIS, COMPLETE (UACMP) WITH MICROSCOPIC
BILIRUBIN URINE: NEGATIVE
Glucose, UA: >=500 mg/dL — AB
HGB URINE DIPSTICK: NEGATIVE
Ketones, ur: NEGATIVE mg/dL
NITRITE: NEGATIVE
PROTEIN: NEGATIVE mg/dL
Specific Gravity, Urine: 1.028 (ref 1.005–1.030)
pH: 5 (ref 5.0–8.0)

## 2016-03-24 LAB — CBC
HEMATOCRIT: 38.6 % (ref 35.0–47.0)
Hemoglobin: 13.5 g/dL (ref 12.0–16.0)
MCH: 31.4 pg (ref 26.0–34.0)
MCHC: 35 g/dL (ref 32.0–36.0)
MCV: 89.8 fL (ref 80.0–100.0)
Platelets: 273 10*3/uL (ref 150–440)
RBC: 4.3 MIL/uL (ref 3.80–5.20)
RDW: 13.5 % (ref 11.5–14.5)
WBC: 16.1 10*3/uL — AB (ref 3.6–11.0)

## 2016-03-24 LAB — GLUCOSE, CAPILLARY
Glucose-Capillary: 345 mg/dL — ABNORMAL HIGH (ref 65–99)
Glucose-Capillary: 267 mg/dL — ABNORMAL HIGH (ref 65–99)

## 2016-03-24 MED ORDER — DIPHENHYDRAMINE HCL 50 MG/ML IJ SOLN
25.0000 mg | Freq: Once | INTRAMUSCULAR | Status: AC
  Administered 2016-03-24: 25 mg via INTRAVENOUS
  Filled 2016-03-24: qty 1

## 2016-03-24 MED ORDER — SODIUM CHLORIDE 0.9 % IV BOLUS (SEPSIS)
1000.0000 mL | Freq: Once | INTRAVENOUS | Status: AC
  Administered 2016-03-24: 1000 mL via INTRAVENOUS

## 2016-03-24 MED ORDER — KETOROLAC TROMETHAMINE 30 MG/ML IJ SOLN
30.0000 mg | Freq: Once | INTRAMUSCULAR | Status: AC
  Administered 2016-03-24: 30 mg via INTRAVENOUS
  Filled 2016-03-24: qty 1

## 2016-03-24 MED ORDER — SODIUM CHLORIDE 0.9 % IV BOLUS (SEPSIS)
500.0000 mL | Freq: Once | INTRAVENOUS | Status: AC
  Administered 2016-03-24: 500 mL via INTRAVENOUS

## 2016-03-24 MED ORDER — CIPROFLOXACIN HCL 500 MG PO TABS
500.0000 mg | ORAL_TABLET | Freq: Once | ORAL | Status: AC
  Administered 2016-03-24: 500 mg via ORAL
  Filled 2016-03-24: qty 1

## 2016-03-24 MED ORDER — METOCLOPRAMIDE HCL 5 MG/ML IJ SOLN
10.0000 mg | Freq: Once | INTRAMUSCULAR | Status: AC
  Administered 2016-03-24: 10 mg via INTRAVENOUS
  Filled 2016-03-24: qty 2

## 2016-03-24 MED ORDER — HALOPERIDOL LACTATE 5 MG/ML IJ SOLN
5.0000 mg | Freq: Once | INTRAMUSCULAR | Status: AC
  Administered 2016-03-24: 5 mg via INTRAVENOUS
  Filled 2016-03-24: qty 1

## 2016-03-24 MED ORDER — MAGNESIUM SULFATE 2 GM/50ML IV SOLN
2.0000 g | Freq: Once | INTRAVENOUS | Status: AC
  Administered 2016-03-24: 2 g via INTRAVENOUS
  Filled 2016-03-24: qty 50

## 2016-03-24 MED ORDER — ACETAMINOPHEN 500 MG PO TABS
1000.0000 mg | ORAL_TABLET | Freq: Once | ORAL | Status: AC
  Administered 2016-03-24: 1000 mg via ORAL
  Filled 2016-03-24: qty 2

## 2016-03-24 NOTE — ED Notes (Signed)
patietn came to ns station with beeping iv pump.  meds and fluids finished. Iv was flushed with ns.

## 2016-03-24 NOTE — ED Provider Notes (Signed)
[facility] Emergency Department Provider Note  ____________________________________________  Time seen: Approximately 12:17 PM  I have reviewed the triage vital signs and the nursing notes.   HISTORY  Chief Complaint Headache and Hyperglycemia   HPI Julie Lambert is a 51 y.o. female history of diabetes and bipolar disorder who presents for evaluation of headache and hyperglycemia. Patient was seen here yesterday with similar complaints. She was found to have a urinary tract infection and was discharged home on Cipro and with resources to be able to afford her metformin. Yesterday she also had a head CT as she had had a fall week prior and had been having headaches ever since. Her head CT was negative. She felt better yesterday evening. When she got home she reports that the headache started again. She describes the headache as generalized, throbbing, currently severe and associated with nausea. No photophobia or phonophobia, no neck stiffness, no fever, no neurological deficits. Patient reports that she has been having daily headaches since her fall week ago. She denies thunderclap headache. This morning she noticed that her sugars were elevated again which prompted her to call the ambulance. She reports that she had to metformin leftover at home and she took one yesterday evening and one this morning. She has not filled her prescription for antibiotics for her UTI. She denies abdominal pain, flank pain, vomiting, diarrhea, chest pain, shortness of breath, URI symptoms.  Past Medical History:  Diagnosis Date  . Bipolar 2 disorder ([place])   . Diabetes mellitus without complication ([place])   . Diverticulosis   . Uterine fibroid     There are no active problems to display for this patient.   Past Surgical History:  Procedure Laterality Date  . BACK SURGERY    . CHOLECYSTECTOMY    . LUMBAR SPINE SURGERY      Prior to Admission medications   Medication Sig  Start Date End Date Taking? Authorizing Provider  ciprofloxacin (CIPRO) 500 MG tablet Take 1 tablet (500 mg total) by mouth 2 (two) times daily. Patient not taking: Reported on 03/24/2016 03/23/16 03/30/16  Earleen Newport, MD  citalopram (CELEXA) 20 MG tablet Take 20 mg by mouth daily.    Historical Provider, MD  HYDROcodone-acetaminophen (NORCO/VICODIN) 5-325 MG tablet Take 1 tablet by mouth every 6 (six) hours as needed for moderate pain. Patient not taking: Reported on 03/23/2016 03/02/16   Laban Emperor, PA-C  metFORMIN (GLUCOPHAGE) 500 MG tablet Take 1 tablet (500 mg total) by mouth 2 (two) times daily with a meal. Patient not taking: Reported on 03/24/2016 03/23/16 03/23/17  Lisa Roca, MD    Allergies Iodinated diagnostic agents  No family history on file.  Social History Social History  Substance Use Topics  . Smoking status: Current Every Day Smoker    Packs/day: 0.50    Types: Cigarettes  . Smokeless tobacco: Never Used  . Alcohol use Yes    Review of Systems  Constitutional: Negative for fever. Eyes: Negative for visual changes. ENT: Negative for sore throat. Neck: No neck pain  Cardiovascular: Negative for chest pain. Respiratory: Negative for shortness of breath. Gastrointestinal: Negative for abdominal pain, vomiting or diarrhea. + nausea Genitourinary: Negative for dysuria. Musculoskeletal: Negative for back pain. Skin: Negative for rash. Neurological: Negative for weakness or numbness. + HA Psych: No SI or HI  ____________________________________________   PHYSICAL EXAM:  VITAL SIGNS: ED Triage Vitals  Enc Vitals Group     BP 03/24/16 1200 114/87  Pulse Rate 03/24/16 1200 89     Resp 03/24/16 1200 16     Temp 03/24/16 1200 98.3 F (36.8 C)     Temp Source 03/24/16 1200 Oral     SpO2 03/24/16 1200 99 %     Weight 03/24/16 1200 180 lb (81.6 kg)     Height 03/24/16 1200 5\' 7"  (1.702 m)     Head Circumference --      Peak Flow --      Pain  Score 03/24/16 1201 10     Pain Loc --      Pain Edu? --      Excl. in [place]? --     Constitutional: Alert and oriented. Well appearing and in no apparent distress. HEENT:      Head: Normocephalic and atraumatic.         Eyes: Conjunctivae are normal. Sclera is non-icteric. EOMI. PERRL      Mouth/Throat: Mucous membranes are moist.       Neck: Supple with no signs of meningismus. Cardiovascular: Regular rate and rhythm. No murmurs, gallops, or rubs. 2+ symmetrical distal pulses are present in all extremities. No JVD. Respiratory: Normal respiratory effort. Lungs are clear to auscultation bilaterally. No wheezes, crackles, or rhonchi.  Gastrointestinal: Soft, non tender, and non distended with positive bowel sounds. No rebound or guarding. Musculoskeletal: Nontender with normal range of motion in all extremities. No edema, cyanosis, or erythema of extremities. Neurologic: Normal speech and language. A & O x3, PERRL, no nystagmus, CN II-XII intact, motor testing reveals good tone and bulk throughout. There is no evidence of pronator drift or dysmetria. Muscle strength is 5/5 throughout. Deep tendon reflexes are 2+ throughout with downgoing toes. Sensory examination is intact. Gait is normal. Skin: Skin is warm, dry and intact. No rash noted. Psychiatric: Mood and affect are normal. Speech and behavior are normal.  ____________________________________________   LABS (all labs ordered are listed, but only abnormal results are displayed)  Labs Reviewed  CBC - Abnormal; Notable for the following:       Result Value   WBC 16.1 (*)    All other components within normal limits  GLUCOSE, CAPILLARY - Abnormal; Notable for the following:    Glucose-Capillary 345 (*)    All other components within normal limits  URINALYSIS, COMPLETE (UACMP) WITH MICROSCOPIC - Abnormal; Notable for the following:    Color, Urine YELLOW (*)    APPearance HAZY (*)    Glucose, UA >=500 (*)    Leukocytes, UA SMALL  (*)    Bacteria, UA RARE (*)    Squamous Epithelial / LPF 6-30 (*)    All other components within normal limits  COMPREHENSIVE METABOLIC PANEL - Abnormal; Notable for the following:    Glucose, Bld 329 (*)    Calcium 8.8 (*)    Total Protein 6.2 (*)    Albumin 3.1 (*)    Total Bilirubin 0.2 (*)    All other components within normal limits  GLUCOSE, CAPILLARY - Abnormal; Notable for the following:    Glucose-Capillary 267 (*)    All other components within normal limits  CBG MONITORING, ED   ____________________________________________  EKG  none  ____________________________________________  RADIOLOGY  none  ____________________________________________   PROCEDURES  Procedure(s) performed: None Procedures Critical Care performed:  None ____________________________________________   INITIAL IMPRESSION / ASSESSMENT AND PLAN / ED COURSE   51 y.o. female history of diabetes and bipolar disorder who presents for evaluation of headache and hyperglycemia. Seen  yesterday for similar presentation. Did not fill abx for UTI. CBG 345. Neuro intact. No meningeal signs, afebrile and well appearing with no signs of meningitis or stroke. CT head negative yesterday. Will give am dose of cipro, will check labs for signs of dehydration and DKA. Will treat HA with toradol, benadryl, reglan, and IVF. Patient was informed that her entire course of cipro and one month of metformin can be purchased at [facility] for $4 dollars each.   ED COURSE:  Repeat BG after 1L NS is 267. No evidence of DKA. Patient given dose of cipro here. HA is resolved. She is tolerating PO. Will dc home at this time.  Pertinent labs & imaging results that were available during my care of the patient were reviewed by me and considered in my medical decision making (see chart for details).    ____________________________________________   FINAL CLINICAL IMPRESSION(S) / ED DIAGNOSES  Final diagnoses:  Type 2  diabetes mellitus with hyperglycemia, without long-term current use of insulin (HCC)  Acute nonintractable headache, unspecified headache type      NEW MEDICATIONS STARTED DURING THIS VISIT:  New Prescriptions   No medications on file     Note:  This document was prepared using Dragon voice recognition software and may include unintentional dictation errors.    Rudene Re, MD 03/24/16 [phone]

## 2016-03-24 NOTE — Discharge Instructions (Signed)

## 2016-03-24 NOTE — ED Triage Notes (Signed)
Pt was seen in ED yesterday for headache and hyperglycemia - last night she started back with headache and hyperglycemia so she called ems

## 2016-03-24 NOTE — ED Notes (Addendum)
Pt was seen in ED yesterday for headache and hyperglycemia - last night she started back with headache and hyperglycemia so she called ems - cbg by ems 389 - BP 100/78 - pt was given rx for cipro and metformin yesterday but has not filled them yet

## 2016-06-01 ENCOUNTER — Emergency Department
Admission: EM | Admit: 2016-06-01 | Discharge: 2016-06-01 | Disposition: A | Discharge status: Home / Self Care | Payer: Self-pay | Attending: Emergency Medicine | Admitting: Emergency Medicine

## 2016-06-01 DIAGNOSIS — Z7984 Long term (current) use of oral hypoglycemic drugs: Secondary | ICD-10-CM | POA: Insufficient documentation

## 2016-06-01 DIAGNOSIS — M5441 Lumbago with sciatica, right side: Secondary | ICD-10-CM | POA: Insufficient documentation

## 2016-06-01 DIAGNOSIS — F17210 Nicotine dependence, cigarettes, uncomplicated: Secondary | ICD-10-CM | POA: Insufficient documentation

## 2016-06-01 DIAGNOSIS — E119 Type 2 diabetes mellitus without complications: Secondary | ICD-10-CM | POA: Insufficient documentation

## 2016-06-01 MED ORDER — CYCLOBENZAPRINE HCL 10 MG PO TABS
10.0000 mg | ORAL_TABLET | Freq: Once | ORAL | Status: AC
  Administered 2016-06-01: 10 mg via ORAL
  Filled 2016-06-01: qty 1

## 2016-06-01 MED ORDER — HYDROCODONE-ACETAMINOPHEN 5-325 MG PO TABS: 1.0000 | ORAL_TABLET | Freq: Four times a day (QID) | ORAL | 0 refills | Status: DC | PRN

## 2016-06-01 MED ORDER — HYDROCODONE-ACETAMINOPHEN 5-325 MG PO TABS
1.0000 | ORAL_TABLET | ORAL | Status: AC
  Administered 2016-06-01: 1 via ORAL
  Filled 2016-06-01: qty 1

## 2016-06-01 MED ORDER — PREDNISONE 10 MG PO TABS: ORAL_TABLET | ORAL | 0 refills | Status: DC

## 2016-06-01 MED ORDER — ONDANSETRON 4 MG PO TBDP: 4.0000 mg | ORAL_TABLET | Freq: Three times a day (TID) | ORAL | 0 refills | Status: DC | PRN

## 2016-06-01 MED ORDER — ONDANSETRON 4 MG PO TBDP
4.0000 mg | ORAL_TABLET | Freq: Once | ORAL | Status: AC
  Administered 2016-06-01: 4 mg via ORAL
  Filled 2016-06-01: qty 1

## 2016-06-01 MED ORDER — PREDNISONE 20 MG PO TABS
60.0000 mg | ORAL_TABLET | Freq: Once | ORAL | Status: AC
  Administered 2016-06-01: 60 mg via ORAL
  Filled 2016-06-01: qty 3

## 2016-06-01 MED ORDER — CYCLOBENZAPRINE HCL 5 MG PO TABS: 5.0000 mg | ORAL_TABLET | Freq: Three times a day (TID) | ORAL | 0 refills | Status: DC | PRN

## 2016-06-01 NOTE — ED Notes (Signed)
See triage note   Having lower back pain  Denies any specific injury but has been doing more strenuous work  Ambulates slowly d/t increased pain

## 2016-06-01 NOTE — ED Triage Notes (Signed)
Pt reports she started working at [employer] and doing more strenuous work than she normally does and c/o ow back pain.  Pain worse when sitting down. Taking Aleve at home.

## 2016-06-01 NOTE — Discharge Instructions (Signed)
Please take medications as prescribed. Return to the ER for any worsening symptoms urgent changes in her health. Follow-up with with orthopedics in 7-10 days if not improving.

## 2016-06-01 NOTE — ED Provider Notes (Signed)
[facility] Provider Note   CSN: [id number] Arrival date & time: 06/01/16  1027     History   Chief Complaint Chief Complaint  Patient presents with  . Back Pain    HPI Julie Lambert is a 51 y.o. female presents to the [facility] part for evaluation of back pain. Patient has had 2 weeks of back pain. She recently returned to work at a [employer] performing a lot of lifting pushing and pulling. She describes numbness in the right foot and leg along with back pain. She denies any trauma or injury. Pain is 10 out of 10. She describes right lower back and buttocks pain that is burning with numbness and tingling rating down the posterior thigh and calf and to the top of the right foot. She has residual numbness in her left foot from a surgery of the lumbar spine performed in 2000. She denies any new symptoms in the left leg. She's been taking Tylenol and Aleve with no improvement.  HPI  Past Medical History:  Diagnosis Date  . Bipolar 2 disorder ([place])   . Diabetes mellitus without complication ([place])   . Diverticulosis   . Uterine fibroid     There are no active problems to display for this patient.   Past Surgical History:  Procedure Laterality Date  . BACK SURGERY    . CHOLECYSTECTOMY    . LUMBAR SPINE SURGERY      OB History    No data available       Home Medications    Prior to Admission medications   Medication Sig Start Date End Date Taking? Authorizing Provider  citalopram (CELEXA) 20 MG tablet Take 20 mg by mouth daily.    Historical Provider, MD  cyclobenzaprine (FLEXERIL) 5 MG tablet Take 1-2 tablets (5-10 mg total) by mouth 3 (three) times daily as needed for muscle spasms. 06/01/16   Duanne Guess, PA-C  HYDROcodone-acetaminophen (NORCO) 5-325 MG tablet Take 1 tablet by mouth every 6 (six) hours as needed for moderate pain. 06/01/16   Duanne Guess, PA-C  metFORMIN (GLUCOPHAGE) 500 MG tablet Take 1 tablet (500 mg total) by mouth 2 (two)  times daily with a meal. Patient not taking: Reported on 03/24/2016 03/23/16 03/23/17  Lisa Roca, MD  ondansetron (ZOFRAN ODT) 4 MG disintegrating tablet Take 1 tablet (4 mg total) by mouth every 8 (eight) hours as needed for nausea or vomiting. 06/01/16   Duanne Guess, PA-C  predniSONE (DELTASONE) 10 MG tablet 10 day taper. 5,5,4,4,3,3,2,2,1,1 06/01/16   Duanne Guess, PA-C    Family History No family history on file.  Social History Social History  Substance Use Topics  . Smoking status: Current Every Day Smoker    Packs/day: 0.50    Types: Cigarettes  . Smokeless tobacco: Never Used  . Alcohol use Yes     Allergies   Iodinated diagnostic agents   Review of Systems Review of Systems  Constitutional: Negative for activity change, chills, fatigue and fever.  HENT: Negative for congestion, sinus pressure and sore throat.   Eyes: Negative for visual disturbance.  Respiratory: Negative for cough, chest tightness and shortness of breath.   Cardiovascular: Negative for chest pain and leg swelling.  Gastrointestinal: Negative for abdominal pain, diarrhea, nausea and vomiting.  Genitourinary: Negative for dysuria.  Musculoskeletal: Positive for back pain. Negative for arthralgias and gait problem.  Skin: Negative for rash.  Neurological: Positive for numbness ( right lower extremity). Negative for weakness and  headaches.  Hematological: Negative for adenopathy.  Psychiatric/Behavioral: Negative for agitation, behavioral problems and confusion.     Physical Exam Updated Vital Signs BP (!) 137/97   Pulse (!) 102   Temp 98 F (36.7 C) (Oral)   Resp 18   Ht 5\' 7"  (1.702 m)   LMP 05/19/2016 (Approximate)   SpO2 98%   Physical Exam  Constitutional: She is oriented to person, place, and time. She appears well-developed and well-nourished. No distress.  HENT:  Head: Normocephalic and atraumatic.  Mouth/Throat: Oropharynx is clear and moist.  Eyes: EOM are normal. Pupils  are equal, round, and reactive to light. Right eye exhibits no discharge. Left eye exhibits no discharge.  Neck: Normal range of motion. Neck supple.  Cardiovascular: Normal rate, regular rhythm and intact distal pulses.   Pulmonary/Chest: Effort normal and breath sounds normal. No respiratory distress. She exhibits no tenderness.  Abdominal: Soft. She exhibits no distension and no mass. There is no tenderness. There is no guarding.  Musculoskeletal:  Lumbar Spine: Examination of the lumbar spine reveals no bony abnormality, no edema, and no ecchymosis.  There is no step off.  The patient has decreased range of motion of the lumbar spine with flexion and extension.  The patient has decreased lateral bend and rotation.  The patient has pain with all range of motion activities.  The patient has a negative axial load test, and a negative rotational Waddell test.  The patient is non tender along the spinous process.  The patient is non tender along the paravertebral muscles, with no muscle spasms.  The patient is non tender along the iliac crest.  The patient is non tender in the sciatic notch.  The patient is non tender along the Sacroiliac joint.  There is no Coccyx joint tenderness.    Bilateral Lower Extremities: Examination of the lower extremities reveals no bony abnormality, no edema, and no ecchymosis.  The patient has full active and passive range of motion of the hips, knees, and ankles.  There is no discomfort with range of motion exercises.  The patient is non tender along the greater trochanter region.  The patient has a negative Bevelyn Buckles' test bilaterally.  There is normal skin warmth.  There is normal capillary refill bilaterally.    Neurologic: The patient has a positive right straight leg raise.  The patient has normal muscle strength testing for the quadriceps, calves, ankle dorsiflexion, ankle plantarflexion, and extensor hallicus longus.  The patient has sensation that is intact to light  touch.    Neurological: She is alert and oriented to person, place, and time. She has normal reflexes.  Skin: Skin is warm and dry.  Psychiatric: She has a normal mood and affect. Her behavior is normal. Thought content normal.     ED Treatments / Results  Labs (all labs ordered are listed, but only abnormal results are displayed) Labs Reviewed - No data to display  EKG  EKG Interpretation None       Radiology No results found.  Procedures Procedures (including critical care time)  Medications Ordered in ED Medications  HYDROcodone-acetaminophen (NORCO/VICODIN) 5-325 MG per tablet 1 tablet (not administered)  predniSONE (DELTASONE) tablet 60 mg (not administered)  cyclobenzaprine (FLEXERIL) tablet 10 mg (not administered)  ondansetron (ZOFRAN-ODT) disintegrating tablet 4 mg (not administered)     Initial Impression / Assessment and Plan / ED Course  I have reviewed the triage vital signs and the nursing notes.  Pertinent labs & imaging results that were  available during my care of the patient were reviewed by me and considered in my medical decision making (see chart for details).     51 year old female with nontraumatic right lower back pain and sciatica symptoms. She recently returned to work, performing a lot of lifting pushing and pulling. She has similar symptoms on the right side that she felt in the left side prior to her back surgery in 2000. She has no weakness or neurological deficits on exam today. She is placed on anti-inflammatory medication, pain tablet and muscle relaxer. She will follow-up with orthopedics if no improvement in 7-10 days. She's educated on signs and symptoms to return to the ED for.  Final Clinical Impressions(s) / ED Diagnoses   Final diagnoses:  Acute midline low back pain with right-sided sciatica    New Prescriptions New Prescriptions   CYCLOBENZAPRINE (FLEXERIL) 5 MG TABLET    Take 1-2 tablets (5-10 mg total) by mouth 3 (three)  times daily as needed for muscle spasms.   HYDROCODONE-ACETAMINOPHEN (NORCO) 5-325 MG TABLET    Take 1 tablet by mouth every 6 (six) hours as needed for moderate pain.   ONDANSETRON (ZOFRAN ODT) 4 MG DISINTEGRATING TABLET    Take 1 tablet (4 mg total) by mouth every 8 (eight) hours as needed for nausea or vomiting.   PREDNISONE (DELTASONE) 10 MG TABLET    10 day taper. 5,5,4,4,3,3,2,2,1,1     Duanne Guess, PA-C 06/01/16 [place], MD 06/01/16 1300

## 2016-06-29 ENCOUNTER — Encounter: Payer: Self-pay | Admitting: Emergency Medicine

## 2016-06-29 ENCOUNTER — Emergency Department
Admission: EM | Admit: 2016-06-29 | Discharge: 2016-06-29 | Disposition: A | Discharge status: Home / Self Care | Payer: Self-pay | Attending: Emergency Medicine | Admitting: Emergency Medicine

## 2016-06-29 DIAGNOSIS — F17210 Nicotine dependence, cigarettes, uncomplicated: Secondary | ICD-10-CM | POA: Insufficient documentation

## 2016-06-29 DIAGNOSIS — Z79899 Other long term (current) drug therapy: Secondary | ICD-10-CM | POA: Insufficient documentation

## 2016-06-29 DIAGNOSIS — M5431 Sciatica, right side: Secondary | ICD-10-CM | POA: Insufficient documentation

## 2016-06-29 DIAGNOSIS — E119 Type 2 diabetes mellitus without complications: Secondary | ICD-10-CM | POA: Insufficient documentation

## 2016-06-29 DIAGNOSIS — Z7984 Long term (current) use of oral hypoglycemic drugs: Secondary | ICD-10-CM | POA: Insufficient documentation

## 2016-06-29 MED ORDER — PREDNISONE 10 MG (21) PO TBPK: ORAL_TABLET | ORAL | 0 refills | Status: DC

## 2016-06-29 MED ORDER — HYDROCODONE-ACETAMINOPHEN 5-325 MG PO TABS: 1.0000 | ORAL_TABLET | ORAL | 0 refills | Status: DC | PRN

## 2016-06-29 MED ORDER — HYDROCODONE-ACETAMINOPHEN 5-325 MG PO TABS
1.0000 | ORAL_TABLET | Freq: Once | ORAL | Status: AC
  Administered 2016-06-29: 1 via ORAL
  Filled 2016-06-29: qty 1

## 2016-06-29 MED ORDER — CYCLOBENZAPRINE HCL 10 MG PO TABS: 10.0000 mg | ORAL_TABLET | Freq: Three times a day (TID) | ORAL | 0 refills | Status: DC | PRN

## 2016-06-29 MED ORDER — DEXAMETHASONE SODIUM PHOSPHATE 10 MG/ML IJ SOLN
10.0000 mg | Freq: Once | INTRAMUSCULAR | Status: AC
  Administered 2016-06-29: 10 mg via INTRAMUSCULAR
  Filled 2016-06-29: qty 1

## 2016-06-29 NOTE — Discharge Instructions (Signed)
Call to schedule a follow up with orthopedics. Return to the ER for symptoms that change or worsen if unable to schedule an appointment.

## 2016-06-29 NOTE — ED Triage Notes (Signed)
Pt arrived via EMS from home.  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 Pt laying on bed and moving legs without difficulty.

## 2016-06-30 NOTE — ED Provider Notes (Signed)
[facility] Emergency Department Provider Note ____________________________________________  Time seen: Approximately 7:26 PM  I have reviewed the triage vital signs and the nursing notes.   HISTORY  Chief Complaint Back Pain    HPI Julie Lambert is a 51 y.o. Lambert who presents to the emergency department for evaluation of acute on chronic back pain. Pain had improved after her last visit here where she received flexeril, prednisone, and norco until Sunday. She states she wore high heels to church and pain has returned and seems worse this time. She states pain now radiates into her right leg. Pain is worse with changing position or turning over in bed. She has taken over the counter medications without relief.   Past Medical History:  Diagnosis Date  . Bipolar 2 disorder ([place])   . Diabetes mellitus without complication ([place])   . Diverticulosis   . Uterine fibroid     There are no active problems to display for this patient.   Past Surgical History:  Procedure Laterality Date  . BACK SURGERY    . CHOLECYSTECTOMY    . LUMBAR SPINE SURGERY      Prior to Admission medications   Medication Sig Start Date End Date Taking? Authorizing Provider  citalopram (CELEXA) 20 MG tablet Take 20 mg by mouth daily.    [provider]  cyclobenzaprine (FLEXERIL) 10 MG tablet Take 1 tablet (10 mg total) by mouth 3 (three) times daily as needed for muscle spasms. 06/29/16   Jorian [place], Johnette Abraham B, FNP  HYDROcodone-acetaminophen (NORCO/VICODIN) 5-325 MG tablet Take 1 tablet by mouth every 4 (four) hours as needed for moderate pain. 06/29/16 06/29/17  Nijae Doyel, Dessa Phi, FNP  metFORMIN (GLUCOPHAGE) 500 MG tablet Take 1 tablet (500 mg total) by mouth 2 (two) times daily with a meal. Patient not taking: Reported on 03/24/2016 03/23/16 03/23/17  Lisa Roca, MD  ondansetron (ZOFRAN ODT) 4 MG disintegrating tablet Take 1 tablet (4 mg total) by mouth every 8 (eight) hours as  needed for nausea or vomiting. 06/01/16   Duanne Guess, PA-C  predniSONE (STERAPRED UNI-PAK 21 TAB) 10 MG (21) TBPK tablet Start on 06/30/16 Take 6 tablets on day 1 Take 5 tablets on day 2 Take 4 tablets on day 3 Take 3 tablets on day 4 Take 2 tablets on day 5 Take 1 tablet on day 6 06/29/16   Kamal Jurgens B, FNP    Allergies Iodinated diagnostic agents  History reviewed. No pertinent family history.  Social History Social History  Substance Use Topics  . Smoking status: Current Every Day Smoker    Packs/day: 0.50    Types: Cigarettes  . Smokeless tobacco: Never Used  . Alcohol use Yes    Review of Systems Constitutional: Well appearing. Respiratory: Negative for cough or congestion Musculoskeletal: Positive for back pain Skin: Negative for lesion or wound  Neurological: Positive for radicular pain.  ____________________________________________   PHYSICAL EXAM:  VITAL SIGNS: ED Triage Vitals  Enc Vitals Group     BP 06/29/16 1329 117/84     Pulse Rate 06/29/16 1329 74     Resp 06/29/16 1329 18     Temp --      Temp src --      SpO2 06/29/16 1156 99 %     Weight 06/29/16 1156 168 lb (76.2 kg)     Height 06/29/16 1156 5\' 7"  (1.702 m)     Head Circumference --      Peak Flow --  Pain Score 06/29/16 1157 9     Pain Loc --      Pain Edu? --      Excl. in [place]? --     Constitutional: Alert and oriented. Well appearing and in no acute distress.  Head: Atraumatic Respiratory: Respirations even and unlabored. Musculoskeletal: No tenderness on palpation Neurologic: Lower back pain radiates posteriorly. No saddle anesthesia.  Skin: Warm and dry without rash, lesion, or wound.  ____________________________________________   LABS (all labs ordered are listed, but only abnormal results are displayed)  Labs Reviewed - No data to display ____________________________________________  RADIOLOGY  Not  indicated. ____________________________________________   PROCEDURES  Procedure(s) performed: None  ____________________________________________   INITIAL IMPRESSION / ASSESSMENT AND PLAN / ED COURSE  Julie Lambert who presents to the emergency department for evaluation of lower back pain. She feels recurrence was triggered by wearing high heels. She will again be treated with prednisone, flexeril, and norco. She was advised to follow up with the orthopedic doctor as soon as possible. She was advised to return to the ER for symptoms that change or worsen if unable to schedule an appointment.  Pertinent labs & imaging results that were available during my care of the patient were reviewed by me and considered in my medical decision making (see chart for details).  _________________________________________   FINAL CLINICAL IMPRESSION(S) / ED DIAGNOSES  Final diagnoses:  Sciatica of right side    Discharge Medication List as of 06/29/2016  1:06 PM    START taking these medications   Details  predniSONE (STERAPRED UNI-PAK 21 TAB) 10 MG (21) TBPK tablet Start on 06/30/16 Take 6 tablets on day 1 Take 5 tablets on day 2 Take 4 tablets on day 3 Take 3 tablets on day 4 Take 2 tablets on day 5 Take 1 tablet on day 6, Print        If controlled substance prescribed during this visit, 12 month history viewed on the [place] prior to issuing an initial prescription for Schedule II or III opiod.    Victorino Dike, FNP 07/01/16 0126    Orbie Pyo, MD 07/02/16 Laureen Abrahams

## 2016-08-16 ENCOUNTER — Emergency Department: Payer: Self-pay

## 2016-08-16 ENCOUNTER — Encounter: Payer: Self-pay | Admitting: Emergency Medicine

## 2016-08-16 ENCOUNTER — Emergency Department
Admission: EM | Admit: 2016-08-16 | Discharge: 2016-08-16 | Disposition: A | Discharge status: Home / Self Care | Payer: Self-pay | Attending: Emergency Medicine | Admitting: Emergency Medicine

## 2016-08-16 DIAGNOSIS — R739 Hyperglycemia, unspecified: Secondary | ICD-10-CM

## 2016-08-16 DIAGNOSIS — E119 Type 2 diabetes mellitus without complications: Secondary | ICD-10-CM

## 2016-08-16 DIAGNOSIS — R079 Chest pain, unspecified: Secondary | ICD-10-CM | POA: Insufficient documentation

## 2016-08-16 DIAGNOSIS — F17210 Nicotine dependence, cigarettes, uncomplicated: Secondary | ICD-10-CM | POA: Insufficient documentation

## 2016-08-16 DIAGNOSIS — E1165 Type 2 diabetes mellitus with hyperglycemia: Secondary | ICD-10-CM | POA: Insufficient documentation

## 2016-08-16 DIAGNOSIS — Z7984 Long term (current) use of oral hypoglycemic drugs: Secondary | ICD-10-CM | POA: Insufficient documentation

## 2016-08-16 LAB — CBC
HEMATOCRIT: 39.2 % (ref 35.0–47.0)
HEMOGLOBIN: 13.5 g/dL (ref 12.0–16.0)
MCH: 31.2 pg (ref 26.0–34.0)
MCHC: 34.3 g/dL (ref 32.0–36.0)
MCV: 90.9 fL (ref 80.0–100.0)
Platelets: 238 10*3/uL (ref 150–440)
RBC: 4.32 MIL/uL (ref 3.80–5.20)
RDW: 13.3 % (ref 11.5–14.5)
WBC: 8.4 10*3/uL (ref 3.6–11.0)

## 2016-08-16 LAB — HEPATIC FUNCTION PANEL
ALK PHOS: 105 U/L (ref 38–126)
ALT: 15 U/L (ref 14–54)
AST: 29 U/L (ref 15–41)
Albumin: 3.3 g/dL — ABNORMAL LOW (ref 3.5–5.0)
BILIRUBIN TOTAL: 0.6 mg/dL (ref 0.3–1.2)
Total Protein: 6.5 g/dL (ref 6.5–8.1)

## 2016-08-16 LAB — BASIC METABOLIC PANEL
ANION GAP: 12 (ref 5–15)
BUN: 9 mg/dL (ref 6–20)
CALCIUM: 8.7 mg/dL — AB (ref 8.9–10.3)
CHLORIDE: 102 mmol/L (ref 101–111)
CO2: 19 mmol/L — AB (ref 22–32)
Creatinine, Ser: 0.78 mg/dL (ref 0.44–1.00)
GFR calc Af Amer: >60 mL/min (ref >60)
GFR calc non Af Amer: >60 mL/min (ref >60)
GLUCOSE: 518 mg/dL — AB (ref 65–99)
POTASSIUM: 3.7 mmol/L (ref 3.5–5.1)
Sodium: 133 mmol/L — ABNORMAL LOW (ref 135–145)

## 2016-08-16 LAB — GLUCOSE, CAPILLARY: Glucose-Capillary: 183 mg/dL — ABNORMAL HIGH (ref 65–99)

## 2016-08-16 LAB — TROPONIN I
Troponin I: <0.03 ng/mL (ref <0.03)
Troponin I: <0.03 ng/mL (ref <0.03)

## 2016-08-16 MED ORDER — ASPIRIN 81 MG PO CHEW
162.0000 mg | CHEWABLE_TABLET | Freq: Once | ORAL | Status: AC
  Administered 2016-08-16: 162 mg via ORAL
  Filled 2016-08-16: qty 2

## 2016-08-16 MED ORDER — INSULIN ASPART 100 UNIT/ML ~~LOC~~ SOLN
15.0000 [IU] | Freq: Once | SUBCUTANEOUS | Status: AC
  Administered 2016-08-16: 15 [IU] via SUBCUTANEOUS
  Filled 2016-08-16: qty 1

## 2016-08-16 MED ORDER — IBUPROFEN 600 MG PO TABS
600.0000 mg | ORAL_TABLET | Freq: Once | ORAL | Status: AC
  Administered 2016-08-16: 600 mg via ORAL
  Filled 2016-08-16: qty 1

## 2016-08-16 MED ORDER — ACETAMINOPHEN 500 MG PO TABS
1000.0000 mg | ORAL_TABLET | Freq: Once | ORAL | Status: AC
  Administered 2016-08-16: 1000 mg via ORAL
  Filled 2016-08-16: qty 2

## 2016-08-16 NOTE — ED Notes (Signed)
Patient taken to xray.

## 2016-08-16 NOTE — ED Triage Notes (Signed)
Patient to ER for c/o mid chest pain with Shob and dizziness. Patient reports strong family history of heart disease, but none personally.

## 2016-08-16 NOTE — ED Notes (Signed)
Gave patient graham crackers.

## 2016-08-16 NOTE — ED Provider Notes (Signed)
[facility] Emergency Department Provider Note  ____________________________________________   First MD Initiated Contact with Patient 08/16/16 1107     (approximate)  I have reviewed the triage vital signs and the nursing notes.   HISTORY  Chief Complaint Chest Pain    HPI Julie Lambert is a 51 y.o. female who comes to the emergency department with sharp aching left-sided nonradiating chest pain that began several hours prior to arrival. She's never had a heart attack before but she does have a history of diabetes mellitus. Her pain is nonexertional. It lasts about a minute at a time and then resolves. Not associated with nausea vomiting or shortness of breath. She's had no leg swelling. She does not take OCPs. She's had no recent surgery or immobilization. 2 of her family members have recently had heart attacks which is concerned her.Nothing in particular seems to make the pain better or worse.   Past Medical History:  Diagnosis Date  . Bipolar 2 disorder ([place])   . Diabetes mellitus without complication ([place])   . Diverticulosis   . Uterine fibroid     There are no active problems to display for this patient.   Past Surgical History:  Procedure Laterality Date  . BACK SURGERY    . CHOLECYSTECTOMY    . LUMBAR SPINE SURGERY      Prior to Admission medications   Medication Sig Start Date End Date Taking? Authorizing Provider  citalopram (CELEXA) 20 MG tablet Take 20 mg by mouth daily.    [provider]  cyclobenzaprine (FLEXERIL) 10 MG tablet Take 1 tablet (10 mg total) by mouth 3 (three) times daily as needed for muscle spasms. 06/29/16   Triplett, Johnette Abraham B, FNP  HYDROcodone-acetaminophen (NORCO/VICODIN) 5-325 MG tablet Take 1 tablet by mouth every 4 (four) hours as needed for moderate pain. 06/29/16 06/29/17  Triplett, Dessa Phi, FNP  metFORMIN (GLUCOPHAGE) 500 MG tablet Take 1 tablet (500 mg total) by mouth 2 (two) times daily with a  meal. Patient not taking: Reported on 03/24/2016 03/23/16 03/23/17  Lisa Roca, MD  ondansetron (ZOFRAN ODT) 4 MG disintegrating tablet Take 1 tablet (4 mg total) by mouth every 8 (eight) hours as needed for nausea or vomiting. 06/01/16   Duanne Guess, PA-C  predniSONE (STERAPRED UNI-PAK 21 TAB) 10 MG (21) TBPK tablet Start on 06/30/16 Take 6 tablets on day 1 Take 5 tablets on day 2 Take 4 tablets on day 3 Take 3 tablets on day 4 Take 2 tablets on day 5 Take 1 tablet on day 6 06/29/16   Triplett, Cari B, FNP    Allergies Iodinated diagnostic agents  No family history on file.  Social History Social History  Substance Use Topics  . Smoking status: Current Every Day Smoker    Packs/day: 0.50    Types: Cigarettes  . Smokeless tobacco: Never Used  . Alcohol use Yes    Review of Systems Constitutional: No fever/chills Eyes: No visual changes. ENT: No sore throat. Cardiovascular: Positive chest pain. Respiratory: Denies shortness of breath. Gastrointestinal: No abdominal pain.  No nausea, no vomiting.  No diarrhea.  No constipation. Genitourinary: Negative for dysuria. Musculoskeletal: Negative for back pain. Skin: Negative for rash. Neurological: Negative for headaches, focal weakness or numbness.   ____________________________________________   PHYSICAL EXAM:  VITAL SIGNS: ED Triage Vitals  Enc Vitals Group     BP 08/16/16 1100 (!) 136/91     Pulse Rate 08/16/16 1100 88  Resp 08/16/16 1100 (!) 24     Temp 08/16/16 1100 98 F (36.7 C)     Temp Source 08/16/16 1100 Oral     SpO2 08/16/16 1100 100 %     Weight 08/16/16 1101 180 lb (81.6 kg)     Height 08/16/16 1101 5\' 7"  (1.702 m)     Head Circumference --      Peak Flow --      Pain Score 08/16/16 1100 10     Pain Loc --      Pain Edu? --      Excl. in [place]? --     Constitutional: Alert and oriented 4 nontoxic no diaphoresis speaks in full clear sentences Eyes: PERRL EOMI. Head: Atraumatic. Nose: No  congestion/rhinnorhea. Mouth/Throat: No trismus Neck: No stridor.   Cardiovascular: Normal rate, regular rhythm. Grossly normal heart sounds.  Good peripheral circulation. Respiratory: Normal respiratory effort.  No retractions. Lungs CTAB and moving good air Gastrointestinal: Soft nontender Musculoskeletal: No lower extremity edema   Neurologic:  Normal speech and language. No gross focal neurologic deficits are appreciated. Skin:  Skin is warm, dry and intact. No rash noted. Psychiatric: Mood and affect are normal. Speech and behavior are normal.    ____________________________________________   DIFFERENTIAL includes but not limited to  Acute coronary syndrome, pulmonary embolism, musculoskeletal pain, Boerhaave syndrome, aortic dissection ____________________________________________   LABS (all labs ordered are listed, but only abnormal results are displayed)  Labs Reviewed  BASIC METABOLIC PANEL - Abnormal; Notable for the following:       Result Value   Sodium 133 (*)    CO2 19 (*)    Glucose, Bld 518 (*)    Calcium 8.7 (*)    All other components within normal limits  HEPATIC FUNCTION PANEL - Abnormal; Notable for the following:    Albumin 3.3 (*)    Bilirubin, Direct <0.1 (*)    All other components within normal limits  GLUCOSE, CAPILLARY - Abnormal; Notable for the following:    Glucose-Capillary 183 (*)    All other components within normal limits  CBC  TROPONIN I  TROPONIN I    No signs of acute ischemia and troponin negative 2 __________________________________________  EKG  ED ECG REPORT I, Darel Hong, the attending physician, personally viewed and interpreted this ECG.  Date: 08/16/2016 Rate: 85 Rhythm: normal sinus rhythm QRS Axis: normal Intervals: normal ST/T Wave abnormalities: normal Narrative Interpretation: unremarkable ________________________________________  RADIOLOGY  Chest x-ray with no acute  disease ____________________________________________   PROCEDURES  Procedure(s) performed: no  Procedures  Critical Care performed: no  Observation: yes  ----------------------------------------- 11:29 AM on 08/16/2016 -----------------------------------------   OBSERVATION CARE: This patient is being placed under observation care for the following reasons: Chest pain with repeat testing to rule out ischemia   ----------------------------------------- 13:49 PM on 08/16/2016 ----------------------------------------- The patient's pain is improved from previous. Disposition pending results of second troponin   _____________________________________  ----------------------------------------- 3:37 PM on 08/16/2016 -----------------------------------------   END OF OBSERVATION STATUS: After an appropriate period of observation, this patient is being discharged due to the following reason(s):  2 troponins are negative no signs of acute cardiac ischemia _______   INITIAL IMPRESSION / ASSESSMENT AND PLAN / ED COURSE  Pertinent labs & imaging results that were available during my care of the patient were reviewed by me and considered in my medical decision making (see chart for details).  The patient arrives somewhat uncomfortable appearing although was not entirely typical chest pain.  Her heart score is 3 pending her first troponin. She will require at least a delta troponin are her observation time is beginning now.     ____________________________________________   FINAL CLINICAL IMPRESSION(S) / ED DIAGNOSES  Final diagnoses:  Chest pain, unspecified type  Hyperglycemia  Type 2 diabetes mellitus without complication, unspecified whether long term insulin use ([place])      NEW MEDICATIONS STARTED DURING THIS VISIT:  Discharge Medication List as of 08/16/2016  3:37 PM       Note:  This document was prepared using Dragon voice recognition software and may  include unintentional dictation errors.     Darel Hong, MD 08/17/16 [phone]

## 2016-08-16 NOTE — Discharge Instructions (Addendum)
Please make an appointment to follow-up with cardiology early next week for reevaluation. Return to the emergency department sooner for any new or worsening symptoms as worsening chest pain, fevers, chills, or for any other concerns.  It was a pleasure to take care of you today, and thank you for coming to our emergency department.  If you have any questions or concerns before leaving please ask the nurse to grab me and I'm more than happy to go through your aftercare instructions again.  If you were prescribed any opioid pain medication today such as Norco, Vicodin, Percocet, morphine, hydrocodone, or oxycodone please make sure you do not drive when you are taking this medication as it can alter your ability to drive safely.  If you have any concerns once you are home that you are not improving or are in fact getting worse before you can make it to your follow-up appointment, please do not hesitate to call 911 and come back for further evaluation.  Darel Hong MD  Results for orders placed or performed during the hospital encounter of 28/31/[birth year]  Basic metabolic panel  Result Value Ref Range   Sodium 133 (L) 135 - 145 mmol/L   Potassium 3.7 3.5 - 5.1 mmol/L   Chloride 102 101 - 111 mmol/L   CO2 19 (L) 22 - 32 mmol/L   Glucose, Bld 518 (HH) 65 - 99 mg/dL   BUN 9 6 - 20 mg/dL   Creatinine, Ser 0.78 0.44 - 1.00 mg/dL   Calcium 8.7 (L) 8.9 - 10.3 mg/dL   GFR calc non Af Amer >60 >60 mL/min   GFR calc Af Amer >60 >60 mL/min   Anion gap 12 5 - 15  CBC  Result Value Ref Range   WBC 8.4 3.6 - 11.0 K/uL   RBC 4.32 3.80 - 5.20 MIL/uL   Hemoglobin 13.5 12.0 - 16.0 g/dL   HCT 39.2 35.0 - 47.0 %   MCV 90.9 80.0 - 100.0 fL   MCH 31.2 26.0 - 34.0 pg   MCHC 34.3 32.0 - 36.0 g/dL   RDW 13.3 11.5 - 14.5 %   Platelets 238 150 - 440 K/uL  Troponin I  Result Value Ref Range   Troponin I <0.03 <0.03 ng/mL  Hepatic function panel  Result Value Ref Range   Total Protein 6.5 6.5 - 8.1 g/dL   Albumin 3.3 (L) 3.5 - 5.0 g/dL   AST 29 15 - 41 U/L   ALT 15 14 - 54 U/L   Alkaline Phosphatase 105 38 - 126 U/L   Total Bilirubin 0.6 0.3 - 1.2 mg/dL   Bilirubin, Direct <0.1 (L) 0.1 - 0.5 mg/dL   Indirect Bilirubin NOT CALCULATED 0.3 - 0.9 mg/dL  Troponin I  Result Value Ref Range   Troponin I <0.03 <0.03 ng/mL   Dg Chest 2 View  Result Date: 08/16/2016 CLINICAL DATA:  Mid sternal chest pain starting yesterday EXAM: CHEST  2 VIEW COMPARISON:  03/23/2016 FINDINGS: The lungs appear clear.  Cardiac and mediastinal contours normal. No pleural effusion identified. Mild thoracic spondylosis. IMPRESSION: No active cardiopulmonary disease. Electronically Signed   By: Van Clines M.D.   On: 08/16/2016 12:11

## 2017-01-02 ENCOUNTER — Other Ambulatory Visit: Payer: Self-pay

## 2017-01-02 ENCOUNTER — Encounter: Payer: Self-pay | Admitting: *Deleted

## 2017-01-02 ENCOUNTER — Emergency Department: Payer: Self-pay

## 2017-01-02 ENCOUNTER — Emergency Department
Admission: EM | Admit: 2017-01-02 | Discharge: 2017-01-02 | Disposition: A | Discharge status: Home / Self Care | Payer: Self-pay | Attending: Emergency Medicine | Admitting: Emergency Medicine

## 2017-01-02 DIAGNOSIS — F17210 Nicotine dependence, cigarettes, uncomplicated: Secondary | ICD-10-CM | POA: Insufficient documentation

## 2017-01-02 DIAGNOSIS — Z79899 Other long term (current) drug therapy: Secondary | ICD-10-CM | POA: Insufficient documentation

## 2017-01-02 DIAGNOSIS — Z7984 Long term (current) use of oral hypoglycemic drugs: Secondary | ICD-10-CM | POA: Insufficient documentation

## 2017-01-02 DIAGNOSIS — R0789 Other chest pain: Secondary | ICD-10-CM

## 2017-01-02 DIAGNOSIS — B349 Viral infection, unspecified: Secondary | ICD-10-CM | POA: Insufficient documentation

## 2017-01-02 DIAGNOSIS — R197 Diarrhea, unspecified: Secondary | ICD-10-CM | POA: Insufficient documentation

## 2017-01-02 DIAGNOSIS — E119 Type 2 diabetes mellitus without complications: Secondary | ICD-10-CM | POA: Insufficient documentation

## 2017-01-02 HISTORY — DX: Diverticulitis of intestine, part unspecified, without perforation or abscess without bleeding: K57.92

## 2017-01-02 LAB — URINALYSIS, COMPLETE (UACMP) WITH MICROSCOPIC
Bilirubin Urine: NEGATIVE
Ketones, ur: NEGATIVE mg/dL
Nitrite: NEGATIVE
PH: 5 (ref 5.0–8.0)
Protein, ur: NEGATIVE mg/dL
Specific Gravity, Urine: 1.035 — ABNORMAL HIGH (ref 1.005–1.030)

## 2017-01-02 LAB — BASIC METABOLIC PANEL
Anion gap: 10 (ref 5–15)
BUN: 13 mg/dL (ref 6–20)
CALCIUM: 8.8 mg/dL — AB (ref 8.9–10.3)
CO2: 20 mmol/L — ABNORMAL LOW (ref 22–32)
Chloride: 105 mmol/L (ref 101–111)
Creatinine, Ser: 0.57 mg/dL (ref 0.44–1.00)
GFR calc Af Amer: >60 mL/min (ref >60)
GLUCOSE: 403 mg/dL — AB (ref 65–99)
Potassium: 3.5 mmol/L (ref 3.5–5.1)
Sodium: 135 mmol/L (ref 135–145)

## 2017-01-02 LAB — CBC
HCT: 43.6 % (ref 35.0–47.0)
Hemoglobin: 14.6 g/dL (ref 12.0–16.0)
MCH: 30.4 pg (ref 26.0–34.0)
MCHC: 33.5 g/dL (ref 32.0–36.0)
MCV: 90.7 fL (ref 80.0–100.0)
Platelets: 220 10*3/uL (ref 150–440)
RBC: 4.8 MIL/uL (ref 3.80–5.20)
RDW: 12.6 % (ref 11.5–14.5)
WBC: 8.4 10*3/uL (ref 3.6–11.0)

## 2017-01-02 LAB — TROPONIN I

## 2017-01-02 LAB — INFLUENZA PANEL BY PCR (TYPE A & B)
INFLBPCR: NEGATIVE
Influenza A By PCR: NEGATIVE

## 2017-01-02 LAB — GLUCOSE, CAPILLARY: GLUCOSE-CAPILLARY: 278 mg/dL — AB (ref 65–99)

## 2017-01-02 MED ORDER — MORPHINE SULFATE (PF) 4 MG/ML IV SOLN
INTRAVENOUS | Status: AC
  Administered 2017-01-02: 4 mg via INTRAVENOUS
  Filled 2017-01-02: qty 1

## 2017-01-02 MED ORDER — MORPHINE SULFATE (PF) 4 MG/ML IV SOLN
4.0000 mg | Freq: Once | INTRAVENOUS | Status: AC
  Administered 2017-01-02: 4 mg via INTRAVENOUS

## 2017-01-02 MED ORDER — ONDANSETRON 4 MG PO TBDP
ORAL_TABLET | ORAL | Status: AC
  Filled 2017-01-02: qty 1

## 2017-01-02 MED ORDER — SODIUM CHLORIDE 0.9 % IV BOLUS (SEPSIS)
1000.0000 mL | Freq: Once | INTRAVENOUS | Status: AC
  Administered 2017-01-02: 1000 mL via INTRAVENOUS

## 2017-01-02 MED ORDER — KETOROLAC TROMETHAMINE 30 MG/ML IJ SOLN
30.0000 mg | Freq: Once | INTRAMUSCULAR | Status: AC
  Administered 2017-01-02: 30 mg via INTRAVENOUS
  Filled 2017-01-02: qty 1

## 2017-01-02 MED ORDER — ONDANSETRON 4 MG PO TBDP
4.0000 mg | ORAL_TABLET | Freq: Once | ORAL | Status: AC
  Administered 2017-01-02: 4 mg via ORAL

## 2017-01-02 NOTE — ED Notes (Signed)
MD to pts bedside to update on lab results and discharge.

## 2017-01-02 NOTE — ED Notes (Signed)
PT left the ED at [place]. RN was in another treatment room and unable to document discharge. Pt was ambulatory and in NAD at time of discharge.

## 2017-01-02 NOTE — ED Notes (Signed)
Pt up to the bathroom at this time. No dizziness or difficulty ambulating noted.

## 2017-01-02 NOTE — ED Provider Notes (Signed)
md  [facility] Emergency Department Provider Note ____________________________________________   First MD Initiated Contact with Patient 01/02/17 1737     (approximate)  I have reviewed the triage vital signs and the nursing notes.   HISTORY  Chief Complaint Chest Pain    HPI Julie Lambert is a 51 y.o. female with history of diabetes and bipolar disorder who presents with multiple complaints, but primarily with chest pain over the last day, gradual onset, intermittent, nonexertional, but worse with taking a deep breath, and somewhat radiating to her back.  Patient also reports generalized body aches and muscle pains over the last several days, as well as generalized weakness and malaise.  She reports chills, diarrhea over the last 3 days, and foul-smelling urine.  Patient denies abdominal pain.  She denies productive cough.  No prior history of the chest pain.  No localized or asymmetrical leg pain or swelling.   Past Medical History:  Diagnosis Date  . Bipolar 2 disorder ([place])   . Diabetes mellitus without complication ([place])   . Diverticulitis   . Diverticulosis   . Uterine fibroid     There are no active problems to display for this patient.   Past Surgical History:  Procedure Laterality Date  . BACK SURGERY    . CHOLECYSTECTOMY    . LUMBAR SPINE SURGERY      Prior to Admission medications   Medication Sig Start Date End Date Taking? Authorizing Provider  metFORMIN (GLUCOPHAGE) 500 MG tablet Take 1 tablet (500 mg total) by mouth 2 (two) times daily with a meal. Patient taking differently: Take 1,000 mg by mouth 2 (two) times daily with a meal.  03/23/16 03/23/17 Yes Lisa Roca, MD  citalopram (CELEXA) 20 MG tablet Take 20 mg by mouth daily.    [provider]  cyclobenzaprine (FLEXERIL) 10 MG tablet Take 1 tablet (10 mg total) by mouth 3 (three) times daily as needed for muscle spasms. 06/29/16   Triplett, Johnette Abraham B, FNP    HYDROcodone-acetaminophen (NORCO/VICODIN) 5-325 MG tablet Take 1 tablet by mouth every 4 (four) hours as needed for moderate pain. 06/29/16 06/29/17  Triplett, Johnette Abraham B, FNP  ondansetron (ZOFRAN ODT) 4 MG disintegrating tablet Take 1 tablet (4 mg total) by mouth every 8 (eight) hours as needed for nausea or vomiting. 06/01/16   Duanne Guess, PA-C  predniSONE (STERAPRED UNI-PAK 21 TAB) 10 MG (21) TBPK tablet Start on 06/30/16 Take 6 tablets on day 1 Take 5 tablets on day 2 Take 4 tablets on day 3 Take 3 tablets on day 4 Take 2 tablets on day 5 Take 1 tablet on day 6 06/29/16   Triplett, Cari B, FNP    Allergies Iodinated diagnostic agents  History reviewed. No pertinent family history.  Social History Social History   Tobacco Use  . Smoking status: Current Every Day Smoker    Packs/day: 0.50    Types: Cigarettes  . Smokeless tobacco: Never Used  Substance Use Topics  . Alcohol use: Yes    Comment: last use 11/16/218  . Drug use: No    Review of Systems  Constitutional: Positive for chills and malaise Eyes: No redness. ENT: No sore throat. Cardiovascular: Positive for chest pain. Respiratory: Positive for shortness of breath. Gastrointestinal: Positive for nausea, no vomiting.  Positive for diarrhea.  Genitourinary: Negative for foul-smelling urine.  Musculoskeletal: Negative for back pain and body aches. Skin: Negative for rash. Neurological: Negative for headache.    ____________________________________________  PHYSICAL EXAM:  VITAL SIGNS: ED Triage Vitals  Enc Vitals Group     BP 01/02/17 1631 95/75     Pulse Rate 01/02/17 1631 (!) 112     Resp 01/02/17 1631 (!) 24     Temp 01/02/17 1631 98.2 F (36.8 C)     Temp Source 01/02/17 1631 Oral     SpO2 01/02/17 1631 100 %     Weight 01/02/17 1631 172 lb (78 kg)     Height 01/02/17 1631 5\' 7"  (1.702 m)     Head Circumference --      Peak Flow --      Pain Score 01/02/17 1628 10     Pain Loc --      Pain  Edu? --      Excl. in [place]? --     Constitutional: Alert and oriented.  Uncomfortable appearing but in no acute distress. Eyes: Conjunctivae are normal.  No photophobia. Head: Atraumatic. Nose: No congestion/rhinnorhea. Mouth/Throat: Mucous membranes are moist.   Neck: Normal range of motion.  Cardiovascular: Borderline tachycardic, regular rhythm. Grossly normal heart sounds.  Good peripheral circulation. Respiratory: Normal respiratory effort.  No retractions. Lungs CTAB. Gastrointestinal: Soft and nontender. No distention.  Genitourinary: Mild left CVA tenderness. Musculoskeletal: No lower extremity edema.  No calf or popliteal swelling or tenderness.  Extremities warm and well perfused.  Neurologic:  Normal speech and language. No gross focal neurologic deficits are appreciated.  Skin:  Skin is warm and dry. No rash noted. Psychiatric: Mood and affect are normal. Speech and behavior are normal.  ____________________________________________   LABS (all labs ordered are listed, but only abnormal results are displayed)  Labs Reviewed  BASIC METABOLIC PANEL - Abnormal; Notable for the following components:      Result Value   CO2 20 (*)    Glucose, Bld 403 (*)    Calcium 8.8 (*)    All other components within normal limits  URINALYSIS, COMPLETE (UACMP) WITH MICROSCOPIC - Abnormal; Notable for the following components:   Color, Urine YELLOW (*)    APPearance CLEAR (*)    Specific Gravity, Urine 1.035 (*)    Glucose, UA >=500 (*)    Hgb urine dipstick SMALL (*)    Leukocytes, UA TRACE (*)    Bacteria, UA RARE (*)    Squamous Epithelial / LPF 0-5 (*)    All other components within normal limits  CBC  TROPONIN I  INFLUENZA PANEL BY PCR (TYPE A & B)   ____________________________________________  EKG  ED ECG REPORT I, Arta Silence, the attending physician, personally viewed and interpreted this ECG.  Date: 01/02/2017 EKG Time: 1637 Rate: 96 Rhythm: normal sinus  rhythm QRS Axis: Left axis deviation Intervals: normal ST/T Wave abnormalities: normal Narrative Interpretation: no evidence of acute ischemia; no significant change when compared to EKG of 08/17/2016  ____________________________________________  RADIOLOGY  CXR: no focal infiltrate or other acute abnormality  ____________________________________________   PROCEDURES  Procedure(s) performed: No    Critical Care performed: No ____________________________________________   INITIAL IMPRESSION / ASSESSMENT AND PLAN / ED COURSE  Pertinent labs & imaging results that were available during my care of the patient were reviewed by me and considered in my medical decision making (see chart for details).  51 year old female with past medical history as noted above presents with multiple symptoms over the last 1-3 days, with chest pain that is atypical and nonexertional over the last day, and body aches, muscle pains, malaise, chills, diarrhea, and  foul-smelling urine over the last several days.  Patient was seen for chest pain in the emergency department on 08/16/2016, with negative workup.  He was also seen twice earlier this year for back pain.  Past medical records otherwise not contributory.  On exam, patient is slightly uncomfortable but not acutely ill-appearing, and the exam is primarily significant for mild left-sided CVA tenderness.  Lungs are clear, and there is no respiratory distress.  Differential includes most likely flu or other viral syndrome, bronchitis, urinary tract infection, or less likely pneumonia.  I do not suspect ACS given the atypical nature of the pain, and the presence of multiple other infectious type symptoms.  I also have no reason to suspect PE; although patient was slightly tachycardic at triage and thus cannot be ruled out by [facility], she is not hypoxic, has no EKG changes, and given the presence of chest pain in the context of multiple other symptoms and other  systems, this is not consistent with a PE and there is no indication for specific testing.  Plan: Basic labs, infection workup, fluids and NSAIDs, and reassess.    ----------------------------------------- 7:29 PM on 01/02/2017 -----------------------------------------  Patient had minimal improvement after Toradol, but states that it "never works for her."  Now feeling much better after fluids and small dose of morphine.  Patient's lab workup is unremarkable except for elevated glucose.  She has no ketones or clinical evidence for DKA.  No evidence of UTI or pneumonia.  Flu test is negative.  Patient's chest pain is resolved.  Given the duration of the pain, there is no evidence to observe patient to keep her for repeat troponin.  There is still no clinical evidence for PE.  Patient's presentation is still consistent with a viral syndrome.  Patient feels well to go home.  Return precautions given and patient expressed understanding.  ____________________________________________   FINAL CLINICAL IMPRESSION(S) / ED DIAGNOSES  Final diagnoses:  Viral syndrome  Atypical chest pain  Diarrhea, unspecified type      NEW MEDICATIONS STARTED DURING THIS VISIT:  This SmartLink is deprecated. Use AVSMEDLIST instead to display the medication list for a patient.   Note:  This document was prepared using Dragon voice recognition software and may include unintentional dictation errors.    Arta Silence, MD 01/02/17 1932

## 2017-01-02 NOTE — ED Triage Notes (Signed)
Pt states 3 days of nausea and diarrhea. Pt states chest pain started last night, substernal, reproducible w/ inspiration. Pt states she was short of breath this morning. Pt states urine has foul smell. Pt is a diabetic and her Metformin was recently increased to 1000 mg BID x 1 week ago. Pt c/o watery diarrhea x 3 days. Pt is pale and in obvious distress. States pain is decreased when she leans forward. Pt drove self to the ED.

## 2017-01-02 NOTE — Discharge Instructions (Signed)
Return to the ER for new or worsening chest pain, difficulty breathing, worsening weakness, fevers, persistent diarrhea or blood in the stool, vomiting, or any other new or worsening symptoms that concern you.  You can take ibuprofen or other anti-inflammatories over-the-counter for your body aches, and try to stay well-hydrated and get plenty of rest.  Follow-up with your primary care doctor within the next week.

## 2017-02-05 ENCOUNTER — Encounter: Payer: Self-pay | Admitting: Emergency Medicine

## 2017-02-05 ENCOUNTER — Emergency Department
Admission: EM | Admit: 2017-02-05 | Discharge: 2017-02-05 | Disposition: A | Discharge status: Home / Self Care | Payer: Self-pay | Attending: Student in an Organized Health Care Education/Training Program | Admitting: Student in an Organized Health Care Education/Training Program

## 2017-02-05 DIAGNOSIS — T162XXA Foreign body in left ear, initial encounter: Secondary | ICD-10-CM | POA: Insufficient documentation

## 2017-02-05 DIAGNOSIS — Y33XXXA Other specified events, undetermined intent, initial encounter: Secondary | ICD-10-CM | POA: Insufficient documentation

## 2017-02-05 DIAGNOSIS — E119 Type 2 diabetes mellitus without complications: Secondary | ICD-10-CM | POA: Insufficient documentation

## 2017-02-05 DIAGNOSIS — Y939 Activity, unspecified: Secondary | ICD-10-CM | POA: Insufficient documentation

## 2017-02-05 DIAGNOSIS — Z79899 Other long term (current) drug therapy: Secondary | ICD-10-CM | POA: Insufficient documentation

## 2017-02-05 DIAGNOSIS — F17210 Nicotine dependence, cigarettes, uncomplicated: Secondary | ICD-10-CM | POA: Insufficient documentation

## 2017-02-05 DIAGNOSIS — Y998 Other external cause status: Secondary | ICD-10-CM | POA: Insufficient documentation

## 2017-02-05 DIAGNOSIS — Y929 Unspecified place or not applicable: Secondary | ICD-10-CM | POA: Insufficient documentation

## 2017-02-05 MED ORDER — BACITRACIN ZINC 500 UNIT/GM EX OINT
TOPICAL_OINTMENT | Freq: Once | CUTANEOUS | Status: AC
  Administered 2017-02-05: 1 via TOPICAL
  Filled 2017-02-05: qty 0.9

## 2017-02-05 NOTE — ED Triage Notes (Signed)
Pt comes into the ED via POV c/o hair that has wrapped through her earring hole and around the earlobe.  Patient in NAD at this time but was unable to cut the hair free.

## 2017-02-05 NOTE — ED Notes (Signed)
Pt is ambulatory without difficulty. NAD, VSS. Denies any pain. Discharge instructions and follow up were reviewed. No questions or concerns voiced during discharge instructions.

## 2017-02-05 NOTE — Discharge Instructions (Signed)
Your had a hair tourniquet around your earlobe. This is what we call it when a hair wraps around something, and causes pain and swelling. Keep the area clean and dry. Use warm soap & water only. Apply antibiotic ointment daily. Follow-up with your provider for any signs of infection.

## 2017-02-05 NOTE — ED Provider Notes (Signed)
[facility] Emergency Department Provider Note ____________________________________________  Time seen: 1510  I have reviewed the triage vital signs and the nursing notes.  HISTORY  Chief Complaint  Ear Injury  HPI Julie Lambert is a 51 y.o. female presents herself to the ED for evaluation of pain to her left ear lobe.  She reports having a piece of hair, wrapped around and through her earlobe.  She had a difficult time can remove the ear on her own.  She presents now with pain, redness, and swelling to the left earlobe.  Past Medical History:  Diagnosis Date  . Bipolar 2 disorder ([place])   . Diabetes mellitus without complication ([place])   . Diverticulitis   . Diverticulosis   . Uterine fibroid     There are no active problems to display for this patient.   Past Surgical History:  Procedure Laterality Date  . BACK SURGERY    . CHOLECYSTECTOMY    . LUMBAR SPINE SURGERY      Prior to Admission medications   Medication Sig Start Date End Date Taking? Authorizing Provider  citalopram (CELEXA) 20 MG tablet Take 20 mg by mouth daily.    [provider]  cyclobenzaprine (FLEXERIL) 10 MG tablet Take 1 tablet (10 mg total) by mouth 3 (three) times daily as needed for muscle spasms. 06/29/16   Triplett, Johnette Abraham B, FNP  HYDROcodone-acetaminophen (NORCO/VICODIN) 5-325 MG tablet Take 1 tablet by mouth every 4 (four) hours as needed for moderate pain. 06/29/16 06/29/17  Triplett, Johnette Abraham B, FNP  metFORMIN (GLUCOPHAGE) 500 MG tablet Take 1 tablet (500 mg total) by mouth 2 (two) times daily with a meal. Patient taking differently: Take 1,000 mg by mouth 2 (two) times daily with a meal.  03/23/16 03/23/17  Lisa Roca, MD  ondansetron (ZOFRAN ODT) 4 MG disintegrating tablet Take 1 tablet (4 mg total) by mouth every 8 (eight) hours as needed for nausea or vomiting. 06/01/16   Duanne Guess, PA-C  predniSONE (STERAPRED UNI-PAK 21 TAB) 10 MG (21) TBPK tablet Start on  06/30/16 Take 6 tablets on day 1 Take 5 tablets on day 2 Take 4 tablets on day 3 Take 3 tablets on day 4 Take 2 tablets on day 5 Take 1 tablet on day 6 06/29/16   Triplett, Cari B, FNP    Allergies Iodinated diagnostic agents  No family history on file.  Social History Social History   Tobacco Use  . Smoking status: Current Every Day Smoker    Packs/day: 0.50    Types: Cigarettes  . Smokeless tobacco: Never Used  Substance Use Topics  . Alcohol use: Yes    Comment: last use 11/16/218  . Drug use: No    Review of Systems  Constitutional: Negative for fever. Eyes: Negative for visual changes. ENT: Negative for sore throat.  Left earlobe pain as above. Skin: Negative for rash. ____________________________________________  PHYSICAL EXAM:  VITAL SIGNS: ED Triage Vitals [02/05/17 1358]  Enc Vitals Group     BP      Pulse      Resp      Temp      Temp src      SpO2      Weight      Height      Head Circumference      Peak Flow      Pain Score 4     Pain Loc      Pain Edu?  Excl. in [place]?     Constitutional: Alert and oriented. Well appearing and in no distress. Head: Normocephalic and atraumatic. Eyes: Conjunctivae are normal. Normal extraocular movements Ears: Left earlobe with a vertically elongated piercing hole, noted to have a long dark hair wrapped around and through the piercing hole.  There is some local edema and erythema to the inferior portion of the earlobe. Cardiovascular: Normal rate, regular rhythm. Normal distal pulses. Respiratory: Normal respiratory effort. Musculoskeletal: Nontender with normal range of motion in all extremities.  Neurologic:  Normal gait without ataxia. Normal speech and language. No gross focal neurologic deficits are appreciated. Skin:  Skin is warm, dry and intact. No rash noted. ____________________________________________  PROCEDURES  .Foreign Body Removal Date/Time: 02/05/2017 4:38 PM Performed by: Melvenia Needles, PA-C Authorized by: Melvenia Needles, PA-C  Consent: Verbal consent obtained. Consent given by: patient Body area: ear Location details: left ear  Anesthesia: Local Anesthetic: topical anesthetic (Gebauer's PainEase) Localization method: visualized Removal mechanism: suture removal blade. Complexity: simple Post-procedure assessment: foreign body removed Patient tolerance: Patient tolerated the procedure well with no immediate complications  ____________________________________________  INITIAL IMPRESSION / ASSESSMENT AND PLAN / ED COURSE  Patient with an ED evaluation for a hair tourniquet to her left earlobe.  The hair is successfully removed using a sharp suture removal blade. Bacitracin ointment is applied, and wound care instructions are provided.  ____________________________________________  FINAL CLINICAL IMPRESSION(S) / ED DIAGNOSES  Final diagnoses:  Acute foreign body of left earlobe, initial encounter     Melvenia Needles, PA-C 02/05/17 1641    Merlyn Lot, MD 02/05/17 1758

## 2017-04-27 ENCOUNTER — Emergency Department
Admission: EM | Admit: 2017-04-27 | Discharge: 2017-04-27 | Disposition: A | Discharge status: Home / Self Care | Payer: Self-pay | Attending: Emergency Medicine | Admitting: Emergency Medicine

## 2017-04-27 ENCOUNTER — Other Ambulatory Visit: Payer: Self-pay

## 2017-04-27 ENCOUNTER — Encounter: Payer: Self-pay | Admitting: Emergency Medicine

## 2017-04-27 DIAGNOSIS — F17210 Nicotine dependence, cigarettes, uncomplicated: Secondary | ICD-10-CM | POA: Insufficient documentation

## 2017-04-27 DIAGNOSIS — Z79899 Other long term (current) drug therapy: Secondary | ICD-10-CM | POA: Insufficient documentation

## 2017-04-27 DIAGNOSIS — B9689 Other specified bacterial agents as the cause of diseases classified elsewhere: Secondary | ICD-10-CM | POA: Insufficient documentation

## 2017-04-27 DIAGNOSIS — N390 Urinary tract infection, site not specified: Secondary | ICD-10-CM | POA: Insufficient documentation

## 2017-04-27 DIAGNOSIS — A599 Trichomoniasis, unspecified: Secondary | ICD-10-CM | POA: Insufficient documentation

## 2017-04-27 DIAGNOSIS — Z7984 Long term (current) use of oral hypoglycemic drugs: Secondary | ICD-10-CM | POA: Insufficient documentation

## 2017-04-27 DIAGNOSIS — N760 Acute vaginitis: Secondary | ICD-10-CM | POA: Insufficient documentation

## 2017-04-27 DIAGNOSIS — E119 Type 2 diabetes mellitus without complications: Secondary | ICD-10-CM | POA: Insufficient documentation

## 2017-04-27 LAB — COMPREHENSIVE METABOLIC PANEL
ALBUMIN: 3.3 g/dL — AB (ref 3.5–5.0)
ALK PHOS: 134 U/L — AB (ref 38–126)
ALT: 13 U/L — AB (ref 14–54)
AST: 19 U/L (ref 15–41)
Anion gap: 14 (ref 5–15)
BILIRUBIN TOTAL: 0.5 mg/dL (ref 0.3–1.2)
BUN: 8 mg/dL (ref 6–20)
CALCIUM: 8.7 mg/dL — AB (ref 8.9–10.3)
CO2: 18 mmol/L — AB (ref 22–32)
CREATININE: 0.62 mg/dL (ref 0.44–1.00)
Chloride: 100 mmol/L — ABNORMAL LOW (ref 101–111)
GFR calc Af Amer: >60 mL/min (ref >60)
GFR calc non Af Amer: >60 mL/min (ref >60)
GLUCOSE: 500 mg/dL — AB (ref 65–99)
Potassium: 3.6 mmol/L (ref 3.5–5.1)
SODIUM: 132 mmol/L — AB (ref 135–145)
TOTAL PROTEIN: 7 g/dL (ref 6.5–8.1)

## 2017-04-27 LAB — WET PREP, GENITAL
SPERM: NONE SEEN
Yeast Wet Prep HPF POC: NONE SEEN

## 2017-04-27 LAB — CBC WITH DIFFERENTIAL/PLATELET
BASOS PCT: 1 %
Basophils Absolute: 0.1 10*3/uL (ref 0–0.1)
Eosinophils Absolute: 0.1 10*3/uL (ref 0–0.7)
Eosinophils Relative: 1 %
HEMATOCRIT: 39.1 % (ref 35.0–47.0)
HEMOGLOBIN: 13.3 g/dL (ref 12.0–16.0)
LYMPHS ABS: 1.4 10*3/uL (ref 1.0–3.6)
Lymphocytes Relative: 13 %
MCH: 31.1 pg (ref 26.0–34.0)
MCHC: 34.2 g/dL (ref 32.0–36.0)
MCV: 91 fL (ref 80.0–100.0)
MONOS PCT: 9 %
Monocytes Absolute: 1 10*3/uL — ABNORMAL HIGH (ref 0.2–0.9)
NEUTROS ABS: 7.8 10*3/uL — AB (ref 1.4–6.5)
NEUTROS PCT: 76 %
Platelets: 202 10*3/uL (ref 150–440)
RBC: 4.29 MIL/uL (ref 3.80–5.20)
RDW: 13.5 % (ref 11.5–14.5)
WBC: 10.3 10*3/uL (ref 3.6–11.0)

## 2017-04-27 LAB — URINALYSIS, COMPLETE (UACMP) WITH MICROSCOPIC
Bilirubin Urine: NEGATIVE
Glucose, UA: >=500 mg/dL — AB
Ketones, ur: NEGATIVE mg/dL
Nitrite: NEGATIVE
PH: 5 (ref 5.0–8.0)
Protein, ur: NEGATIVE mg/dL
Specific Gravity, Urine: 1.031 — ABNORMAL HIGH (ref 1.005–1.030)

## 2017-04-27 LAB — GLUCOSE, CAPILLARY
GLUCOSE-CAPILLARY: 541 mg/dL — AB (ref 65–99)
GLUCOSE-CAPILLARY: 335 mg/dL — AB (ref 65–99)

## 2017-04-27 LAB — CHLAMYDIA/NGC RT PCR (ARMC ONLY)
Chlamydia Tr: NOT DETECTED
N gonorrhoeae: NOT DETECTED

## 2017-04-27 MED ORDER — SODIUM CHLORIDE 0.9 % IV SOLN
1.0000 g | Freq: Once | INTRAVENOUS | Status: AC
  Administered 2017-04-27: 1 g via INTRAVENOUS
  Filled 2017-04-27: qty 10

## 2017-04-27 MED ORDER — AZITHROMYCIN 500 MG PO TABS
1000.0000 mg | ORAL_TABLET | Freq: Once | ORAL | Status: AC
  Administered 2017-04-27: 1000 mg via ORAL
  Filled 2017-04-27: qty 2

## 2017-04-27 MED ORDER — ONDANSETRON HCL 4 MG/2ML IJ SOLN
4.0000 mg | Freq: Once | INTRAMUSCULAR | Status: AC
  Administered 2017-04-27: 4 mg via INTRAVENOUS
  Filled 2017-04-27: qty 2

## 2017-04-27 MED ORDER — SODIUM CHLORIDE 0.9 % IV BOLUS (SEPSIS)
1000.0000 mL | Freq: Once | INTRAVENOUS | Status: AC
  Administered 2017-04-27: 1000 mL via INTRAVENOUS

## 2017-04-27 MED ORDER — METRONIDAZOLE 500 MG PO TABS: 500.0000 mg | ORAL_TABLET | Freq: Two times a day (BID) | ORAL | 0 refills | Status: DC

## 2017-04-27 MED ORDER — ONDANSETRON 4 MG PO TBDP: 4.0000 mg | ORAL_TABLET | Freq: Three times a day (TID) | ORAL | 0 refills | Status: DC | PRN

## 2017-04-27 NOTE — ED Provider Notes (Signed)
[facility] Emergency Department Provider Note  ____________________________________________   First MD Initiated Contact with Patient 04/27/17 [phone]     (approximate)  I have reviewed the triage vital signs and the nursing notes.   HISTORY  Chief Complaint Cough and Vaginal Discharge   HPI [name] is a 52 y.o. female is here with complaint of cough for the last 2 days.  She is unaware of any fever and denies chills.  Patient is also had vomiting for the last 2 days and is uncertain whether her metformin has been in her stomach long enough to help control her diabetes.  She states that to her knowledge her blood sugar usually runs around 180.  She does not check her glucose often.  She also is here due to a brownish vaginal discharge that is been going on for the last 2 days.  Patient states that she is in a relationship but is unsure if her partner is monogamous.  She is having unprotected sex.  Patient denies any previous vaginal issues.  She is unaware of whether or not her partner is having any problems at this time.  She rates her pain as 10/10.   Past Medical History:  Diagnosis Date  . Bipolar 2 disorder ([place])   . Diabetes mellitus without complication ([place])   . Diverticulitis   . Diverticulosis   . Uterine fibroid     There are no active problems to display for this patient.   Past Surgical History:  Procedure Laterality Date  . BACK SURGERY    . CHOLECYSTECTOMY    . LUMBAR SPINE SURGERY      Prior to Admission medications   Medication Sig Start Date End Date Taking? Authorizing Provider  citalopram (CELEXA) 20 MG tablet Take 20 mg by mouth daily.    [provider]  metFORMIN (GLUCOPHAGE) 500 MG tablet Take 1 tablet (500 mg total) by mouth 2 (two) times daily with a meal. Patient taking differently: Take 1,000 mg by mouth 2 (two) times daily with a meal.  03/23/16 03/23/17  Lisa Roca, MD  metroNIDAZOLE (FLAGYL) 500 MG  tablet Take 1 tablet (500 mg total) by mouth 2 (two) times daily. 04/27/17   Johnn Hai, PA-C  ondansetron (ZOFRAN ODT) 4 MG disintegrating tablet Take 1 tablet (4 mg total) by mouth every 8 (eight) hours as needed for nausea or vomiting. 04/27/17   Johnn Hai, PA-C    Allergies Iodinated diagnostic agents  No family history on file.  Social History Social History   Tobacco Use  . Smoking status: Current Every Day Smoker    Packs/day: 0.20    Types: Cigarettes  . Smokeless tobacco: Never Used  Substance Use Topics  . Alcohol use: Yes    Comment: last use 11/16/218  . Drug use: No    Review of Systems Constitutional: No fever/chills Eyes: No visual changes. ENT: No sore throat.  Negative for ear pain. Cardiovascular: Denies chest pain. Respiratory: Denies shortness of breath.  Positive for cough. Gastrointestinal: No abdominal pain.  No nausea, no vomiting.  No diarrhea.   Genitourinary: Positive for dysuria.  Positive for vaginal discharge. Musculoskeletal: Negative for back pain. Skin: Negative for rash. Neurological: Negative for headaches, focal weakness or numbness. ___________________________________________   PHYSICAL EXAM:  VITAL SIGNS: ED Triage Vitals  Enc Vitals Group     BP 04/27/17 0811 (!) 128/96     Pulse Rate 04/27/17 0811 97     Resp  04/27/17 0811 20     Temp 04/27/17 0811 98.6 F (37 C)     Temp Source 04/27/17 0811 Oral     SpO2 04/27/17 0811 99 %     Weight 04/27/17 0812 178 lb (80.7 kg)     Height 04/27/17 0812 5\' 7"  (1.702 m)     Head Circumference --      Peak Flow --      Pain Score 04/27/17 0814 10     Pain Loc --      Pain Edu? --      Excl. in [place]? --    Constitutional: Alert and oriented. Well appearing and in no acute distress. Eyes: Conjunctivae are normal.  Head: Atraumatic. Nose: Mild congestion/rhinnorhea. Mouth/Throat: Mucous membranes are moist.  Oropharynx non-erythematous.  Posterior drainage noted. Neck: No  stridor.   Hematological/Lymphatic/Immunilogical: No cervical lymphadenopathy. Cardiovascular: Normal rate, regular rhythm. Grossly normal heart sounds.  Good peripheral circulation. Respiratory: Normal respiratory effort.  No retractions. Lungs CTAB. Gastrointestinal: Soft and nontender. No distention.  No CVA tenderness. Genitourinary: On vaginal exam there is copious amounts of green vaginal discharge noted along the vaginal walls is well as a copious amount noted in the vaginal vault.  Cervix is friable and bleeds easily when obtaining cultures.  There are no adnexal masses present.  There is some tenderness bilaterally.  Patient was unable to tolerate pelvic exam well.  Cervical motion tenderness was not appreciated however patient had generalized tenderness on bimanual exam alone. Musculoskeletal: Moves upper and lower extremities without any difficulty. Neurologic:  Normal speech and language. No gross focal neurologic deficits are appreciated. No gait instability. Skin:  Skin is warm, dry and intact. No rash noted. Psychiatric: Mood and affect are normal. Speech and behavior are normal.  ____________________________________________   LABS (all labs ordered are listed, but only abnormal results are displayed)  Labs Reviewed  WET PREP, GENITAL - Abnormal; Notable for the following components:      Result Value   Trich, Wet Prep PRESENT (*)    Clue Cells Wet Prep HPF POC PRESENT (*)    WBC, Wet Prep HPF POC MANY (*)    All other components within normal limits  URINALYSIS, COMPLETE (UACMP) WITH MICROSCOPIC - Abnormal; Notable for the following components:   Color, Urine STRAW (*)    APPearance HAZY (*)    Specific Gravity, Urine 1.031 (*)    Glucose, UA >=500 (*)    Hgb urine dipstick SMALL (*)    Leukocytes, UA MODERATE (*)    Bacteria, UA RARE (*)    Squamous Epithelial / LPF 0-5 (*)    All other components within normal limits  GLUCOSE, CAPILLARY - Abnormal; Notable for the  following components:   Glucose-Capillary 541 (*)    All other components within normal limits  CBC WITH DIFFERENTIAL/PLATELET - Abnormal; Notable for the following components:   Neutro Abs 7.8 (*)    Monocytes Absolute 1.0 (*)    All other components within normal limits  COMPREHENSIVE METABOLIC PANEL - Abnormal; Notable for the following components:   Sodium 132 (*)    Chloride 100 (*)    CO2 18 (*)    Glucose, Bld 500 (*)    Calcium 8.7 (*)    Albumin 3.3 (*)    ALT 13 (*)    Alkaline Phosphatase 134 (*)    All other components within normal limits  GLUCOSE, CAPILLARY - Abnormal; Notable for the following components:   Glucose-Capillary 335 (*)  All other components within normal limits  CHLAMYDIA/NGC RT PCR ([facility] ONLY)  URINE CULTURE  CBG MONITORING, ED  CBG MONITORING, ED     PROCEDURES  Procedure(s) performed: None  Procedures  Critical Care performed: No  ____________________________________________   INITIAL IMPRESSION / ASSESSMENT AND PLAN / ED COURSE  As part of my medical decision making, I reviewed the following data within the electronic MEDICAL RECORD NUMBER Notes from prior ED visits and [place] Controlled Substance Database  In talking with patient she does not adhere to a diabetic diet.  She has been drinking Kool-Aid made with sugar.  Patient's blood sugar initially was 541.  Patient was given a liter of fluids to hydrate and prior to discharge her blood sugar was 335.  Patient was given Zofran and nausea has subsided.  At this time it is unclear if she also has a UTI along with her vaginal infection.  Patient was given Rocephin 1 g IV while in the department.  She was given Zithromax 1 g p.o.  Patient was made aware that she does have trichomonas and bacterial vaginosis and a prescription for Flagyl was written 500 mg twice daily for 7 days.  Patient was given a prescription for Zofran if needed for nausea.  She is to follow-up with the health department  if any continued vaginal issues.  She is also encouraged to follow-up with her PCP for her diabetes as it does not appear that she is adhering to a diabetic diet and possibly not taking her medication as prescribed.  Patient was discharged in stable condition.  ____________________________________________   FINAL CLINICAL IMPRESSION(S) / ED DIAGNOSES  Final diagnoses:  BV (bacterial vaginosis)  Trichimoniasis  Acute urinary tract infection  Type 2 diabetes mellitus without complication, without long-term current use of insulin [facility])     ED Discharge Orders        Ordered    metroNIDAZOLE (FLAGYL) 500 MG tablet  2 times daily     04/27/17 1311    ondansetron (ZOFRAN ODT) 4 MG disintegrating tablet  Every 8 hours PRN     04/27/17 1311       Note:  This document was prepared using Dragon voice recognition software and may include unintentional dictation errors.    Johnn Hai, PA-C 04/27/17 1640    Schuyler Amor, MD 04/28/17 [phone]

## 2017-04-27 NOTE — Discharge Instructions (Signed)
Follow-up with your primary care doctor or [facility] if you have any continued vaginal problems.  You will be called if your cultures show any other positive findings.  Take Flagyl 500 mg twice daily for the next 7 days which will treat both your bacterial vaginosis and trichomoniasis.  No sexual activity for 10-14 days. Increase fluids.

## 2017-04-27 NOTE — ED Notes (Addendum)
See triage note  Presents with cough for couple of days  Unsure of fever but has had body aches   Also noticed some bloody vaginal discharge yesterday  Afebrile on arrival  Positive nausea this am

## 2017-04-27 NOTE — ED Triage Notes (Signed)
Cough x 2 days. Brown vaginal discharge x 2 days. Denies fevers.

## 2017-04-29 LAB — URINE CULTURE: Special Requests: NORMAL

## 2017-05-22 ENCOUNTER — Emergency Department: Payer: Self-pay

## 2017-05-22 ENCOUNTER — Emergency Department
Admission: EM | Admit: 2017-05-22 | Discharge: 2017-05-22 | Disposition: A | Discharge status: Home / Self Care | Payer: Self-pay | Attending: Emergency Medicine | Admitting: Emergency Medicine

## 2017-05-22 ENCOUNTER — Encounter: Payer: Self-pay | Admitting: Emergency Medicine

## 2017-05-22 ENCOUNTER — Other Ambulatory Visit: Payer: Self-pay

## 2017-05-22 DIAGNOSIS — F17210 Nicotine dependence, cigarettes, uncomplicated: Secondary | ICD-10-CM | POA: Insufficient documentation

## 2017-05-22 DIAGNOSIS — J209 Acute bronchitis, unspecified: Secondary | ICD-10-CM | POA: Insufficient documentation

## 2017-05-22 DIAGNOSIS — E119 Type 2 diabetes mellitus without complications: Secondary | ICD-10-CM | POA: Insufficient documentation

## 2017-05-22 DIAGNOSIS — Z79899 Other long term (current) drug therapy: Secondary | ICD-10-CM | POA: Insufficient documentation

## 2017-05-22 MED ORDER — AMOXICILLIN 500 MG PO CAPS: 500.0000 mg | ORAL_CAPSULE | Freq: Three times a day (TID) | ORAL | 0 refills | Status: DC

## 2017-05-22 MED ORDER — PREDNISONE 10 MG (21) PO TBPK: ORAL_TABLET | ORAL | 0 refills | Status: DC

## 2017-05-22 MED ORDER — HYDROCODONE-HOMATROPINE 5-1.5 MG/5ML PO SYRP: 5.0000 mL | ORAL_SOLUTION | Freq: Four times a day (QID) | ORAL | 0 refills | Status: DC | PRN

## 2017-05-22 NOTE — ED Triage Notes (Signed)
Cough for   2 weeks

## 2017-05-22 NOTE — Discharge Instructions (Addendum)
Follow-up with your regular doctor or the acute care if you are not better in 3-5 days.  Take medication as prescribed.  If you are worsening please return the emergency department.

## 2017-05-22 NOTE — ED Provider Notes (Signed)
[facility] Emergency Department Provider Note  ____________________________________________   None    (approximate)  I have reviewed the triage vital signs and the nursing notes.   HISTORY  Chief Complaint Cough    HPI Julie Lambert is a 52 y.o. female presents emergency department complaining of cough for 2 weeks.  She states the cough is productive with yellow and green sputum.  She states that she coughs all night and has not been able to rest.  She is becoming fatigued due to the lack of sleep.  She denies any fever or chills.  She states she does feel little short of breath.  She denies chest pain or shortness of breath on exertion.  She is a smoker.  Past Medical History:  Diagnosis Date  . Bipolar 2 disorder ([place])   . Diabetes mellitus without complication ([place])   . Diverticulitis   . Diverticulosis   . Uterine fibroid     There are no active problems to display for this patient.   Past Surgical History:  Procedure Laterality Date  . BACK SURGERY    . CHOLECYSTECTOMY    . LUMBAR SPINE SURGERY      Prior to Admission medications   Medication Sig Start Date End Date Taking? Authorizing Provider  amoxicillin (AMOXIL) 500 MG capsule Take 1 capsule (500 mg total) by mouth 3 (three) times daily. 05/22/17   Fisher, Linden Dolin, PA-C  citalopram (CELEXA) 20 MG tablet Take 20 mg by mouth daily.    [provider]  HYDROcodone-homatropine (HYCODAN) 5-1.5 MG/5ML syrup Take 5 mLs by mouth every 6 (six) hours as needed. 05/22/17   Fisher, Linden Dolin, PA-C  metFORMIN (GLUCOPHAGE) 500 MG tablet Take 1 tablet (500 mg total) by mouth 2 (two) times daily with a meal. Patient taking differently: Take 1,000 mg by mouth 2 (two) times daily with a meal.  03/23/16 03/23/17  Lisa Roca, MD  metroNIDAZOLE (FLAGYL) 500 MG tablet Take 1 tablet (500 mg total) by mouth 2 (two) times daily. 04/27/17   Johnn Hai, PA-C  ondansetron (ZOFRAN ODT) 4 MG  disintegrating tablet Take 1 tablet (4 mg total) by mouth every 8 (eight) hours as needed for nausea or vomiting. 04/27/17   Johnn Hai, PA-C  predniSONE (STERAPRED UNI-PAK 21 TAB) 10 MG (21) TBPK tablet Take 6 pills on day one then decrease by 1 pill each day 05/22/17   Versie Starks, PA-C    Allergies Iodinated diagnostic agents  No family history on file.  Social History Social History   Tobacco Use  . Smoking status: Current Every Day Smoker    Packs/day: 0.20    Types: Cigarettes  . Smokeless tobacco: Never Used  Substance Use Topics  . Alcohol use: Yes    Comment: last use 11/16/218  . Drug use: No    Review of Systems  Constitutional: No fever/chills Eyes: No visual changes. ENT: No sore throat. Respiratory: Positive cough Cardiothoracic: Denies chest pain Genitourinary: Negative for dysuria. Musculoskeletal: Negative for back pain. Skin: Negative for rash.    ____________________________________________   PHYSICAL EXAM:  VITAL SIGNS: ED Triage Vitals  Enc Vitals Group     BP 05/22/17 1458 123/80     Pulse Rate 05/22/17 1458 88     Resp 05/22/17 1458 20     Temp 05/22/17 1458 97.9 F (36.6 C)     Temp Source 05/22/17 1458 Oral     SpO2 05/22/17 1458 98 %  Weight 05/22/17 1454 174 lb (78.9 kg)     Height 05/22/17 1454 5\' 7"  (1.702 m)     Head Circumference --      Peak Flow --      Pain Score 05/22/17 1454 9     Pain Loc --      Pain Edu? --      Excl. in [place]? --     Constitutional: Alert and oriented. Well appearing and in no acute distress. Eyes: Conjunctivae are normal.  Head: Atraumatic. Nose: No congestion/rhinnorhea. Mouth/Throat: Mucous membranes are moist.  Throat appears normal Cardiovascular: Normal rate, regular rhythm.  Heart sounds are normal Respiratory: Normal respiratory effort.  No retractions, lungs are clear to auscultation GU: deferred Musculoskeletal: FROM all extremities, warm and well perfused Neurologic:   Normal speech and language.  Skin:  Skin is warm, dry and intact. No rash noted. Psychiatric: Mood and affect are normal. Speech and behavior are normal.  ____________________________________________   LABS (all labs ordered are listed, but only abnormal results are displayed)  Labs Reviewed - No data to display ____________________________________________   ____________________________________________  RADIOLOGY  Chest x-ray is negative for pneumonia  ____________________________________________   PROCEDURES  Procedure(s) performed: No  Procedures    ____________________________________________   INITIAL IMPRESSION / ASSESSMENT AND PLAN / ED COURSE  Pertinent labs & imaging results that were available during my care of the patient were reviewed by me and considered in my medical decision making (see chart for details).  Patient is 52 year old female presents emergency department complaining of a cough for 2 weeks.  On physical exam she appears tired, lungs are clear to auscultation heart sounds are normal  X-ray of the chest is negative for any acute abnormality  Spine chest x-ray results to the patient.  She was given prescription for amoxicillin, Sterapred, and Hycodan cough syrup.  She was instructed to return to the emergency department if she is worsening.  She should see her regular doctor if not better in 3-5 days.  She states she understands.  She was given a work note to stay at home and rest.  She was discharged in stable condition     As part of my medical decision making, I reviewed the following data within the [place] notes reviewed and incorporated, Old chart reviewed, Radiograph reviewed chest x-ray is negative for acute abnormality, Notes from prior ED visits and [place] Controlled Substance Database  ____________________________________________   FINAL CLINICAL IMPRESSION(S) / ED DIAGNOSES  Final diagnoses:  Acute  bronchitis, unspecified organism      NEW MEDICATIONS STARTED DURING THIS VISIT:  Discharge Medication List as of 05/22/2017  4:28 PM    START taking these medications   Details  amoxicillin (AMOXIL) 500 MG capsule Take 1 capsule (500 mg total) by mouth 3 (three) times daily., Starting Fri 05/22/2017, Print    HYDROcodone-homatropine (HYCODAN) 5-1.5 MG/5ML syrup Take 5 mLs by mouth every 6 (six) hours as needed., Starting Fri 05/22/2017, Print    predniSONE (STERAPRED UNI-PAK 21 TAB) 10 MG (21) TBPK tablet Take 6 pills on day one then decrease by 1 pill each day, Print         Note:  This document was prepared using Dragon voice recognition software and may include unintentional dictation errors.    Versie Starks, PA-C 05/22/17 Herbert Deaner, MD 05/22/17 [phone]

## 2017-05-28 ENCOUNTER — Other Ambulatory Visit: Payer: Self-pay

## 2017-05-28 ENCOUNTER — Encounter: Payer: Self-pay | Admitting: Emergency Medicine

## 2017-05-28 ENCOUNTER — Emergency Department: Payer: Self-pay

## 2017-05-28 ENCOUNTER — Emergency Department
Admission: EM | Admit: 2017-05-28 | Discharge: 2017-05-28 | Disposition: A | Discharge status: Home / Self Care | Payer: Self-pay | Attending: Emergency Medicine | Admitting: Emergency Medicine

## 2017-05-28 DIAGNOSIS — J40 Bronchitis, not specified as acute or chronic: Secondary | ICD-10-CM

## 2017-05-28 DIAGNOSIS — Z9119 Patient's noncompliance with other medical treatment and regimen: Secondary | ICD-10-CM | POA: Insufficient documentation

## 2017-05-28 DIAGNOSIS — E119 Type 2 diabetes mellitus without complications: Secondary | ICD-10-CM | POA: Insufficient documentation

## 2017-05-28 DIAGNOSIS — R059 Cough, unspecified: Secondary | ICD-10-CM

## 2017-05-28 DIAGNOSIS — F17210 Nicotine dependence, cigarettes, uncomplicated: Secondary | ICD-10-CM | POA: Insufficient documentation

## 2017-05-28 DIAGNOSIS — R05 Cough: Secondary | ICD-10-CM

## 2017-05-28 DIAGNOSIS — J209 Acute bronchitis, unspecified: Secondary | ICD-10-CM | POA: Insufficient documentation

## 2017-05-28 DIAGNOSIS — Z720 Tobacco use: Secondary | ICD-10-CM

## 2017-05-28 DIAGNOSIS — Z91199 Patient's noncompliance with other medical treatment and regimen due to unspecified reason: Secondary | ICD-10-CM

## 2017-05-28 DIAGNOSIS — Z79899 Other long term (current) drug therapy: Secondary | ICD-10-CM | POA: Insufficient documentation

## 2017-05-28 DIAGNOSIS — R7309 Other abnormal glucose: Secondary | ICD-10-CM | POA: Insufficient documentation

## 2017-05-28 LAB — BASIC METABOLIC PANEL
Anion gap: 9 (ref 5–15)
BUN: 12 mg/dL (ref 6–20)
CALCIUM: 8.8 mg/dL — AB (ref 8.9–10.3)
CO2: 23 mmol/L (ref 22–32)
CREATININE: 0.62 mg/dL (ref 0.44–1.00)
Chloride: 100 mmol/L — ABNORMAL LOW (ref 101–111)
GFR calc Af Amer: >60 mL/min (ref >60)
Glucose, Bld: 577 mg/dL (ref 65–99)
Potassium: 3.8 mmol/L (ref 3.5–5.1)
SODIUM: 132 mmol/L — AB (ref 135–145)

## 2017-05-28 LAB — CBC
HCT: 36.7 % (ref 35.0–47.0)
Hemoglobin: 12.8 g/dL (ref 12.0–16.0)
MCH: 31.4 pg (ref 26.0–34.0)
MCHC: 34.8 g/dL (ref 32.0–36.0)
MCV: 90.1 fL (ref 80.0–100.0)
PLATELETS: 305 10*3/uL (ref 150–440)
RBC: 4.08 MIL/uL (ref 3.80–5.20)
RDW: 13 % (ref 11.5–14.5)
WBC: 19.3 10*3/uL — AB (ref 3.6–11.0)

## 2017-05-28 LAB — TROPONIN I

## 2017-05-28 LAB — GLUCOSE, CAPILLARY: GLUCOSE-CAPILLARY: 290 mg/dL — AB (ref 65–99)

## 2017-05-28 MED ORDER — ALBUTEROL SULFATE HFA 108 (90 BASE) MCG/ACT IN AERS: 2.0000 | INHALATION_SPRAY | Freq: Four times a day (QID) | RESPIRATORY_TRACT | 2 refills | Status: DC | PRN

## 2017-05-28 MED ORDER — BENZONATATE 100 MG PO CAPS: 100.0000 mg | ORAL_CAPSULE | Freq: Four times a day (QID) | ORAL | 0 refills | Status: DC | PRN

## 2017-05-28 MED ORDER — ONDANSETRON HCL 4 MG/2ML IJ SOLN
4.0000 mg | Freq: Once | INTRAMUSCULAR | Status: AC
  Administered 2017-05-28: 4 mg via INTRAVENOUS
  Filled 2017-05-28: qty 2

## 2017-05-28 MED ORDER — INSULIN ASPART 100 UNIT/ML ~~LOC~~ SOLN
8.0000 [IU] | Freq: Once | SUBCUTANEOUS | Status: AC
  Administered 2017-05-28: 8 [IU] via INTRAVENOUS
  Filled 2017-05-28: qty 1

## 2017-05-28 MED ORDER — IPRATROPIUM-ALBUTEROL 0.5-2.5 (3) MG/3ML IN SOLN
3.0000 mL | Freq: Once | RESPIRATORY_TRACT | Status: AC
  Administered 2017-05-28: 3 mL via RESPIRATORY_TRACT
  Filled 2017-05-28: qty 3

## 2017-05-28 MED ORDER — METFORMIN HCL 500 MG PO TABS: 1000.0000 mg | ORAL_TABLET | Freq: Two times a day (BID) | ORAL | 0 refills | Status: DC

## 2017-05-28 NOTE — ED Triage Notes (Signed)
Pt in via POV with complaints of increasing shortness of breath, headache, left ear pain.  Pt reports being seen here recently, dx with bronchitis, taking prescription meds without any relief.  Vitals WDL.

## 2017-05-28 NOTE — ED Provider Notes (Addendum)
[facility] Emergency Department Provider Note  ____________________________________________   I have reviewed the triage vital signs and the nursing notes. Where available I have reviewed prior notes and, if possible and indicated, outside hospital notes.    HISTORY  Chief Complaint Shortness of Breath    HPI Julie Lambert is a 52 y.o. female bipolar disorder, noncompliance and diabetes mellitus, tobacco abuse, recurrent bronchitis, who presents today with a persistent cough for 2 weeks.  Is actually getting slightly better but it hurts to cough sometimes and also she has an earache.  Patient has been taking antibiotics and steroids for this.  She does not check her sugars at home has not been taking any of her hypoglycemic medication for the last 2-3 weeks she states.  She states she has had ear pain for the last couple days.  Is also uncomfortable to keep coughing.  She states she is unable to smoke cigarettes as a result of this persistent cough.  The cough is occasionally productive, she is not had any fevers, she denies any chest pain unless she is in the physical active coughing and that is mostly the muscles of her back which are uncomfortable.  History of recurrent bronchitis and by my read has had at least 13 different x-rays in the last 8-10 years.    Past Medical History:  Diagnosis Date  . Bipolar 2 disorder ([place])   . Diabetes mellitus without complication ([place])   . Diverticulitis   . Diverticulosis   . Uterine fibroid     There are no active problems to display for this patient.   Past Surgical History:  Procedure Laterality Date  . BACK SURGERY    . CHOLECYSTECTOMY    . LUMBAR SPINE SURGERY      Prior to Admission medications   Medication Sig Start Date End Date Taking? Authorizing Provider  amoxicillin (AMOXIL) 500 MG capsule Take 1 capsule (500 mg total) by mouth 3 (three) times daily. 05/22/17   Fisher, Linden Dolin, PA-C  citalopram  (CELEXA) 20 MG tablet Take 20 mg by mouth daily.    [provider]  HYDROcodone-homatropine (HYCODAN) 5-1.5 MG/5ML syrup Take 5 mLs by mouth every 6 (six) hours as needed. 05/22/17   Fisher, Linden Dolin, PA-C  metFORMIN (GLUCOPHAGE) 500 MG tablet Take 1 tablet (500 mg total) by mouth 2 (two) times daily with a meal. Patient taking differently: Take 1,000 mg by mouth 2 (two) times daily with a meal.  03/23/16 03/23/17  Lisa Roca, MD  metroNIDAZOLE (FLAGYL) 500 MG tablet Take 1 tablet (500 mg total) by mouth 2 (two) times daily. 04/27/17   Johnn Hai, PA-C  ondansetron (ZOFRAN ODT) 4 MG disintegrating tablet Take 1 tablet (4 mg total) by mouth every 8 (eight) hours as needed for nausea or vomiting. 04/27/17   Johnn Hai, PA-C  predniSONE (STERAPRED UNI-PAK 21 TAB) 10 MG (21) TBPK tablet Take 6 pills on day one then decrease by 1 pill each day 05/22/17   Versie Starks, PA-C    Allergies Iodinated diagnostic agents  No family history on file.  Social History Social History   Tobacco Use  . Smoking status: Current Every Day Smoker    Packs/day: 0.20    Types: Cigarettes  . Smokeless tobacco: Never Used  Substance Use Topics  . Alcohol use: Yes    Comment: last use 11/16/218  . Drug use: No    Review of Systems Constitutional: No fever/chills Eyes: No  visual changes. ENT: No sore throat. No stiff neck no neck pain Cardiovascular: Denies chest pain unless she is coughing then her whole body hurts Respiratory: Positive for cough Gastrointestinal:   no vomiting.  No diarrhea.  No constipation. Genitourinary: Negative for dysuria. Musculoskeletal: Negative lower extremity swelling Skin: Negative for rash. Neurological: Negative for severe headaches, focal weakness or numbness.   ____________________________________________   PHYSICAL EXAM:  VITAL SIGNS: ED Triage Vitals  Enc Vitals Group     BP 05/28/17 1416 126/83     Pulse Rate 05/28/17 1416 93     Resp  05/28/17 1416 18     Temp 05/28/17 1416 98.6 F (37 C)     Temp Source 05/28/17 1416 Oral     SpO2 05/28/17 1416 98 %     Weight 05/28/17 1416 170 lb (77.1 kg)     Height 05/28/17 1416 5\' 7"  (1.702 m)     Head Circumference --      Peak Flow --      Pain Score 05/28/17 1420 9     Pain Loc --      Pain Edu? --      Excl. in [place]? --     Constitutional: Alert and oriented. Well appearing and in no acute distress. Eyes: Conjunctivae are normal Head: Atraumatic HEENT: No congestion/rhinnorhea. Mucous membranes are moist.  Oropharynx non-erythematous Neck:   Nontender with no meningismus, no masses, no stridor Cardiovascular: Normal rate, regular rhythm. Grossly normal heart sounds.  Good peripheral circulation. Chest: Tender palpation chest wall nontender chest patient states "ouch that the pain right there" and pulls back.  Crepitus no flail chest, this is pretty much reproducible anywhere I touch on her thorax Respiratory: Normal respiratory effort.  No retractions. Lungs CTAB.  Occasional cough noted Abdominal: Soft and nontender. No distention. No guarding no rebound Back:  There is no focal tenderness or step off.  there is no midline tenderness there are no lesions noted. there is no CVA tenderness Musculoskeletal: No lower extremity tenderness, no upper extremity tenderness. No joint effusions, no DVT signs strong distal pulses no edema Neurologic:  Normal speech and language. No gross focal neurologic deficits are appreciated.  Skin:  Skin is warm, dry and intact. No rash noted. Psychiatric: Mood and affect are normal. Speech and behavior are normal.  ____________________________________________   LABS (all labs ordered are listed, but only abnormal results are displayed)  Labs Reviewed  BASIC METABOLIC PANEL - Abnormal; Notable for the following components:      Result Value   Sodium 132 (*)    Chloride 100 (*)    Glucose, Bld 577 (*)    Calcium 8.8 (*)    All other  components within normal limits  CBC - Abnormal; Notable for the following components:   WBC 19.3 (*)    All other components within normal limits  TROPONIN I    Pertinent labs  results that were available during my care of the patient were reviewed by me and considered in my medical decision making (see chart for details). ____________________________________________  EKG  I personally interpreted any EKGs ordered by me or triage Normal sinus rhythm rate 98 bpm, LAD noted no acute ST elevation or depression, ____________________________________________  RADIOLOGY  Pertinent labs & imaging results that were available during my care of the patient were reviewed by me and considered in my medical decision making (see chart for details). If possible, patient and/or family made aware of any abnormal findings.  Dg Chest 2 View  Result Date: 05/28/2017 CLINICAL DATA:  Increasing shortness of breath, headache, LEFT ear pain, bronchitis EXAM: CHEST - 2 VIEW COMPARISON:  05/22/2017 FINDINGS: Normal heart size, mediastinal contours, and pulmonary vascularity. Lungs clear. No pleural effusion or pneumothorax. Bones unremarkable. IMPRESSION: Normal exam. Electronically Signed   By: Lavonia Dana M.D.   On: 05/28/2017 14:54   ____________________________________________    PROCEDURES  Procedure(s) performed: None  Procedures  Critical Care performed: None  ____________________________________________   INITIAL IMPRESSION / ASSESSMENT AND PLAN / ED COURSE  Pertinent labs & imaging results that were available during my care of the patient were reviewed by me and considered in my medical decision making (see chart for details).  She is here with bronchitic cough which is chronic and recurrent for her.  Also ear pain but she has mild retraction of the TM but no evidence of otitis media she is already on antibiotics her sugar is quite elevated however, last few times we have seen her is been  above 400 and this is not unusual for her especially since she is not taking her home medications.  I have counseled her extensively about compliance with medications I will rewrite for her home medications I am giving her insulin and fluid here.  She has no evidence of DKA and I do not think it merits an admission to the hospital because of isolated elevated blood glucose in the context of noncompliance.  We will recheck to make sure her sugar comes down.  At this time, there does not appear to be clinical evidence to support the diagnosis of pulmonary embolus, dissection, myocarditis, endocarditis, pericarditis, pericardial tamponade, acute coronary syndrome, pneumothorax, pneumonia, or any other acute intrathoracic pathology that will require admission or acute intervention. Nor is there evidence of any significant intra-abdominal pathology causing this discomfort.  Do not think further antibiotics are indicated in this patient with chronic bronchitic problems, we will send her home with a cough medication at her request, as I do not think that would be harmful to her, and we will also send her home with close outpatient follow-up repeat prescriptions for her glucose medicine and we have strongly advise close outpatient follow-up.,  ----------------------------------------- 5:08 PM on 05/28/2017 -----------------------------------------  Some occasional cough and that the only symptoms we have really appreciated thus far, we are giving her fluids and insulin only because of her elevated sugar which is even elevated by her standards, we will recheck and see.  Her white count is up but she is on steroids, this also probably was pushing up her sugar in addition to her noncompliance.  No indication for admission to the hospital seen, and we will give her cough medication here it is my hope that her sugar is trending down after our interventions and if it is we will try to get her safely  home.  ----------------------------------------- 6:13 PM on 05/28/2017 -----------------------------------------  GM trending down satisfactorily, patient in no acute distress we will discharge her home.  No coughing after her Tessalon Perles and her albuterol.  Return precautions and follow-up given and understood.  She also was advised to stay with someone to make sure she eats that she is not used to having insulin.   ____________________________________________   FINAL CLINICAL IMPRESSION(S) / ED DIAGNOSES  Final diagnoses:  None      This chart was dictated using voice recognition software.  Despite best efforts to proofread,  errors can occur which can change meaning.  Schuyler Amor, MD 05/28/17 1627    Schuyler Amor, MD 05/28/17 1627    Schuyler Amor, MD 05/28/17 1709    Schuyler Amor, MD 05/28/17 [phone]

## 2017-07-12 ENCOUNTER — Emergency Department: Payer: Self-pay

## 2017-07-12 ENCOUNTER — Emergency Department
Admission: EM | Admit: 2017-07-12 | Discharge: 2017-07-12 | Disposition: A | Discharge status: Home / Self Care | Payer: Self-pay | Attending: Emergency Medicine | Admitting: Emergency Medicine

## 2017-07-12 ENCOUNTER — Other Ambulatory Visit: Payer: Self-pay

## 2017-07-12 DIAGNOSIS — Z7984 Long term (current) use of oral hypoglycemic drugs: Secondary | ICD-10-CM | POA: Insufficient documentation

## 2017-07-12 DIAGNOSIS — F17210 Nicotine dependence, cigarettes, uncomplicated: Secondary | ICD-10-CM | POA: Insufficient documentation

## 2017-07-12 DIAGNOSIS — F329 Major depressive disorder, single episode, unspecified: Secondary | ICD-10-CM | POA: Insufficient documentation

## 2017-07-12 DIAGNOSIS — R0789 Other chest pain: Secondary | ICD-10-CM | POA: Insufficient documentation

## 2017-07-12 DIAGNOSIS — R4182 Altered mental status, unspecified: Secondary | ICD-10-CM | POA: Insufficient documentation

## 2017-07-12 DIAGNOSIS — E1165 Type 2 diabetes mellitus with hyperglycemia: Secondary | ICD-10-CM | POA: Insufficient documentation

## 2017-07-12 DIAGNOSIS — F3181 Bipolar II disorder: Secondary | ICD-10-CM | POA: Insufficient documentation

## 2017-07-12 DIAGNOSIS — Z79899 Other long term (current) drug therapy: Secondary | ICD-10-CM | POA: Insufficient documentation

## 2017-07-12 DIAGNOSIS — F32A Depression, unspecified: Secondary | ICD-10-CM

## 2017-07-12 LAB — CBC
HEMATOCRIT: 42.6 % (ref 35.0–47.0)
Hemoglobin: 14.9 g/dL (ref 12.0–16.0)
MCH: 31.4 pg (ref 26.0–34.0)
MCHC: 35.1 g/dL (ref 32.0–36.0)
MCV: 89.5 fL (ref 80.0–100.0)
PLATELETS: 254 10*3/uL (ref 150–440)
RBC: 4.76 MIL/uL (ref 3.80–5.20)
RDW: 13.9 % (ref 11.5–14.5)
WBC: 8.9 10*3/uL (ref 3.6–11.0)

## 2017-07-12 LAB — URINALYSIS, COMPLETE (UACMP) WITH MICROSCOPIC
Bilirubin Urine: NEGATIVE
Glucose, UA: >=500 mg/dL — AB
Hgb urine dipstick: NEGATIVE
Ketones, ur: NEGATIVE mg/dL
Leukocytes, UA: NEGATIVE
Nitrite: POSITIVE — AB
PROTEIN: NEGATIVE mg/dL
SPECIFIC GRAVITY, URINE: 1.028 (ref 1.005–1.030)
pH: 5 (ref 5.0–8.0)

## 2017-07-12 LAB — BASIC METABOLIC PANEL
Anion gap: 11 (ref 5–15)
BUN: 11 mg/dL (ref 6–20)
CO2: 23 mmol/L (ref 22–32)
CREATININE: 0.82 mg/dL (ref 0.44–1.00)
Calcium: 9.5 mg/dL (ref 8.9–10.3)
Chloride: 101 mmol/L (ref 101–111)
GFR calc Af Amer: >60 mL/min (ref >60)
GLUCOSE: 535 mg/dL — AB (ref 65–99)
POTASSIUM: 4.4 mmol/L (ref 3.5–5.1)
SODIUM: 135 mmol/L (ref 135–145)

## 2017-07-12 LAB — POCT PREGNANCY, URINE: Preg Test, Ur: NEGATIVE

## 2017-07-12 LAB — GLUCOSE, CAPILLARY
GLUCOSE-CAPILLARY: 433 mg/dL — AB (ref 65–99)
GLUCOSE-CAPILLARY: 496 mg/dL — AB (ref 65–99)
Glucose-Capillary: 367 mg/dL — ABNORMAL HIGH (ref 65–99)

## 2017-07-12 LAB — TROPONIN I: Troponin I: <0.03 ng/mL (ref <0.03)

## 2017-07-12 MED ORDER — ARIPIPRAZOLE 5 MG PO TABS: 5.0000 mg | ORAL_TABLET | Freq: Every day | ORAL | 2 refills | Status: DC

## 2017-07-12 MED ORDER — METFORMIN HCL 500 MG PO TABS: 500.0000 mg | ORAL_TABLET | Freq: Two times a day (BID) | ORAL | 11 refills | Status: DC

## 2017-07-12 MED ORDER — SODIUM CHLORIDE 0.9 % IV BOLUS
1000.0000 mL | INTRAVENOUS | Status: AC
  Administered 2017-07-12: 1000 mL via INTRAVENOUS

## 2017-07-12 MED ORDER — METFORMIN HCL 500 MG PO TABS
500.0000 mg | ORAL_TABLET | Freq: Once | ORAL | Status: AC
  Administered 2017-07-12: 500 mg via ORAL
  Filled 2017-07-12: qty 1

## 2017-07-12 MED ORDER — SODIUM CHLORIDE 0.9 % IV BOLUS
1000.0000 mL | Freq: Once | INTRAVENOUS | Status: AC
  Administered 2017-07-12: 1000 mL via INTRAVENOUS

## 2017-07-12 NOTE — ED Triage Notes (Signed)
Pt arrives [facility] from home for substernal CP that began this AM around 9. Also c/o back pain. CBG read high for EMS. Pt received 338ml NS en route and second EMS CBG 587. Pt arrives alert and oriented, ambulatory. VSS en route. EMS gave 324 asa. Arrives 18 G L FA.

## 2017-07-12 NOTE — ED Notes (Signed)
SOC completed 

## 2017-07-12 NOTE — BH Assessment (Signed)
Discussed patient with ER MD (Dr. Karma Greaser) and patient is able to discharge home when medically cleared. Patient was giving referral information and instructions on how to follow up with Outpatient Treatment ([facility]) and [employer].  Patient denies SI/HI and AV/H.  Patient gave Probation officer verbal permission to forward her information to [facility] Peer Support Worker Lanae Boast B.), to have him follow-up with her.

## 2017-07-12 NOTE — ED Notes (Signed)
Pt sitting up in bed eating a snack.

## 2017-07-12 NOTE — ED Notes (Signed)
Pt taken to CT via stretcher.

## 2017-07-12 NOTE — ED Notes (Signed)
Pt calling for a ride

## 2017-07-12 NOTE — ED Notes (Signed)
Dr. Forbach at bedside.  

## 2017-07-12 NOTE — BH Assessment (Signed)
Assessment Note  Julie Lambert is an 53 y.o. female who presents to the ER due to chest pain, while in the ER she shared her current stressors. While sharing, she was tearful and at time difficult to understand. 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 She states "I let my job go so I can take care of my mom." Mother diagnosed with dementia. They had to move into the family home. "We went from a five bedroom home to a three bedroom. From just [place] to two to seven people living under one roof." She further reports, there are two teenagers in the home and they make a great deal of noise and it is causing the patient and her mother to lose sleep. Her mother is declining fast. She is forgetting how to do things and it's taking more time and energy to care for her. Patient expressed her love for her mother and upset with herself for getting frustrated with the caregiver roll.  Patient further shared she was diagnosed with Bipolar II in 1990 when she was in substance abuse treatment. During that time she was in active addiction and she had severe mood swings. "I was mean little something. I lost a lot of great people because I ran them off." Patient state she no longer take the medications and was doing well, when it was just she and her mother living together. Her mother moved in with her in 2007, after her stepfather passed. They lived together up till April 15th, 2019. That's when they moved into the their current residents. Patient states, she is noticing she is getting irritable and short tempered.  "I'm not like I was when I was put on my medicine but I feel like I'm headed back their."  During the interview, the patient was cooperative and tearful. She was able to provide appropriate answers to the questions. When asked about current substance abuse use, her answers were creative enough to not answer the question.  When writer asked her to  answer yes or no and give a specific date of last use, patient laughed stated "well you aint' going to let me slide are you." She admitted to ongoing cocaine use and since the move the amount and frequency has increased.   Patient denies SI/HI and AV/H.    Diagnosis: Bipolar (Per patient's report) and Cocaine Use Disorder  Past Medical History:  Past Medical History:  Diagnosis Date  . Bipolar 2 disorder ([place])   . Diabetes mellitus without complication ([place])   . Diverticulitis   . Diverticulosis   . Uterine fibroid     Past Surgical History:  Procedure Laterality Date  . BACK SURGERY    . CHOLECYSTECTOMY    . LUMBAR SPINE SURGERY      Family History: History reviewed. No pertinent family history.  Social History:  reports that she has been smoking cigarettes.  She has been smoking about 0.20 packs per day. She has never used smokeless tobacco. She reports that she drinks alcohol. She reports that she does not use drugs.  Additional Social History:  Alcohol / Drug Use Pain Medications: See PTA Prescriptions: See PTA Over the Counter: See PTA History of alcohol / drug use?: Yes Longest period of sobriety (when/how long): Unable to quantify Negative Consequences of Use: Personal relationships, Financial(n/a) Withdrawal Symptoms: (Reports of none) Substance #1 Name of Substance 1: Cocaine 1 - Last Use / Amount: Unknown  CIWA: CIWA-Ar BP: 116/81 Pulse Rate: 77 COWS:    Allergies:  Allergies  Allergen Reactions  . Iodinated Diagnostic Agents Itching    Patient stated she had itching on her back after IV injection    Home Medications:  (Not in a hospital admission)  OB/GYN Status:  No LMP recorded. Patient is perimenopausal.  General Assessment Data Location of Assessment: [facility] ED TTS Assessment: In system Is this a Tele or Face-to-Face Assessment?: Face-to-Face Is this an Initial Assessment or a Re-assessment for this encounter?: Initial Assessment Marital  status: Single Maiden name: n/a Is patient pregnant?: No Living Arrangements: Other (Comment), Non-relatives/Friends Can pt return to current living arrangement?: Yes Admission Status: Voluntary Is patient capable of signing voluntary admission?: Yes Referral Source: Self/Family/Friend Insurance type: None  Medical Screening Exam ([place]) Medical Exam completed: Yes  Crisis Care Plan Living Arrangements: Other (Comment), Non-relatives/Friends Legal Guardian: Other:(Self) Name of Psychiatrist: Reports of none Name of Therapist: Reports of none  Education Status Is patient currently in school?: No Is the patient employed, unemployed or receiving disability?: Unemployed  Risk to self with the past 6 months Suicidal Ideation: No Has patient been a risk to self within the past 6 months prior to admission? : No Suicidal Intent: No Has patient had any suicidal intent within the past 6 months prior to admission? : No Is patient at risk for suicide?: No Suicidal Plan?: No Has patient had any suicidal plan within the past 6 months prior to admission? : No Access to Means: No What has been your use of drugs/alcohol within the last 12 months?: Cocaine Previous Attempts/Gestures: No How many times?: 0 Other Self Harm Risks: Active Addiction Triggers for Past Attempts: None known Intentional Self Injurious Behavior: None Family Suicide History: Unknown Recent stressful life event(s): Other (Comment), Loss (Comment), Job Loss, Financial Problems, Recent negative physical changes, Conflict (Comment) Persecutory voices/beliefs?: No Depression: Yes Depression Symptoms: Insomnia, Tearfulness, Isolating, Fatigue, Guilt, Loss of interest in usual pleasures, Feeling worthless/self pity, Feeling angry/irritable Substance abuse history and/or treatment for substance abuse?: Yes Suicide prevention information given to non-admitted patients: Not applicable  Risk to Others within the  past 6 months Homicidal Ideation: No Does patient have any lifetime risk of violence toward others beyond the six months prior to admission? : No Thoughts of Harm to Others: No Current Homicidal Intent: No Current Homicidal Plan: No Access to Homicidal Means: No Identified Victim: Reports of none History of harm to others?: No Assessment of Violence: None Noted Violent Behavior Description: Reports of none Does patient have access to weapons?: No Criminal Charges Pending?: No Does patient have a court date: No Is patient on probation?: No  Psychosis Hallucinations: None noted Delusions: None noted  Mental Status Report Appearance/Hygiene: Unremarkable, In scrubs Eye Contact: Good Motor Activity: Freedom of movement, Unremarkable Speech: Logical/coherent, Unremarkable Level of Consciousness: Alert Mood: Anxious, Sad, Pleasant Affect: Anxious, Appropriate to circumstance, Depressed, Irritable, Sad Anxiety Level: Minimal Thought Processes: Coherent, Relevant Judgement: Unimpaired Orientation: Lambert, Place, Time, Situation, Appropriate for developmental age Obsessive Compulsive Thoughts/Behaviors: Minimal  Cognitive Functioning Concentration: Normal Memory: Recent Intact, Remote Intact Is patient IDD: No Is patient DD?: No Insight: Fair Impulse Control: Fair Appetite: Good Have you had any weight changes? : No Change Sleep: Decreased Total Hours of Sleep: 3 Vegetative Symptoms: None  ADLScreening [facility] Assessment Services) Patient's cognitive ability adequate to safely complete daily activities?: Yes Patient able to express need for assistance with ADLs?: Yes Independently performs ADLs?: Yes (appropriate  for developmental age)  Prior Inpatient Therapy Prior Inpatient Therapy: Yes Prior Therapy Dates: 1990 Prior Therapy Facilty/Provider(s): [facility] Reason for Treatment: Substance Use & Bipolar  Prior Outpatient Therapy Prior  Outpatient Therapy: No Does patient have an ACCT team?: No Does patient have Intensive In-House Services?  : No Does patient have [facility] services? : No Does patient have P4CC services?: No  ADL Screening (condition at time of admission) Patient's cognitive ability adequate to safely complete daily activities?: Yes Is the patient deaf or have difficulty hearing?: No Does the patient have difficulty seeing, even when wearing glasses/contacts?: No Does the patient have difficulty concentrating, remembering, or making decisions?: No Patient able to express need for assistance with ADLs?: Yes Does the patient have difficulty dressing or bathing?: No Independently performs ADLs?: Yes (appropriate for developmental age) Does the patient have difficulty walking or climbing stairs?: No Weakness of Legs: None Weakness of Arms/Hands: None  Home Assistive Devices/Equipment Home Assistive Devices/Equipment: None  Therapy Consults (therapy consults require a physician order) PT Evaluation Needed: No OT Evalulation Needed: No SLP Evaluation Needed: No Abuse/Neglect Assessment (Assessment to be complete while patient is alone) Abuse/Neglect Assessment Can Be Completed: Yes Physical Abuse: Denies Verbal Abuse: Denies Sexual Abuse: Denies Exploitation of patient/patient's resources: Denies Self-Neglect: Denies Values / Beliefs Cultural Requests During Hospitalization: None Spiritual Requests During Hospitalization: None Consults Spiritual Care Consult Needed: No Social Work Consult Needed: No Regulatory affairs officer (For Healthcare) Does Patient Have a Medical Advance Directive?: No Does patient want to make changes to medical advance directive?: No - Patient declined       Child/Adolescent Assessment Running Away Risk: Denies(Patient is an adult)  Disposition:     On Site Evaluation by:   Reviewed with Physician:    Gunnar Fusi MS, LCAS, [place], [place], CCSI Therapeutic Triage  Specialist 07/12/2017 5:28 PM.

## 2017-07-12 NOTE — ED Notes (Addendum)
Pt states she hasn't taken her DM pills in about 3 weeks d/t moving recently and not being able to get meds. Pt became tearful while talking about moving. States her mom is ill and she has been having to take care of her. States she doesn't have a job but just had an interview, supposed to hear back about it tomorrow.

## 2017-07-12 NOTE — Discharge Instructions (Signed)
Your workup in the Emergency Department today was reassuring.  We did not find any specific abnormalities other than your elevated blood sugar, for which we gave you fluids and wrote a new prescription for metformin.  The psychiatrist with whom you spoke recommended starting back on Abilify, and we wrote a prescription for that as well..  We recommend you drink plenty of fluids, take your regular medications and/or any new ones prescribed today, and follow up with the doctor(s) listed in these documents as recommended.  Return to the Emergency Department if you develop new or worsening symptoms that concern you.

## 2017-07-12 NOTE — ED Notes (Signed)
Date and time results received: 07/12/17 1453   Test: glucose Critical Value: 535  Name of Provider Notified: Dr. Karma Greaser

## 2017-07-12 NOTE — ED Notes (Signed)
TTS at bedside. 

## 2017-07-12 NOTE — ED Notes (Signed)
Pt given meal tray. SOC machine set up in pt room.

## 2017-07-12 NOTE — ED Provider Notes (Signed)
[facility] Emergency Department Provider Note  ____________________________________________   First MD Initiated Contact with Patient 07/12/17 1447     (approximate)  I have reviewed the triage vital signs and the nursing notes.   HISTORY  Chief Complaint Chest Pain and Hyperglycemia    HPI Julie Lambert is a 52 y.o. female with medical history as listed below presents for evaluation of being tired as well as having some chest pain.  Symptoms have been present and gradually worsening over the last 3 to 4 days.  Nothing particular makes it better or worse.  She describes it as an aching pain that radiates generally from the front of her chest to her back.  It is not sharp or ripping.  It is not accompanied with shortness of breath.  As she was talking to me she burst into tears and cried fairly consistently for an extended period of time.  She reports that she has been going through a very rough time recently, having lost her home and having to move her and her elderly mother in with her mother's sister.  She reports that she cares for her mother all the time and there is no one else to help.  She is under a great deal of stress as a result and she says it is a bad situation.  Additionally, she reports that she has been off of her Abilify for several months because of running out of her prescription and not being able to go back to a doctor.  She does feel that she felt better when she was on the medication.  She is very clear on multiple occasions and denying suicidal ideation but says that she just is having trouble handling everything as she is very depressed but would never hurt herself or anyone else, particularly because she knows that her mother relies on her.  She denies fever/chills, nausea, vomiting, and abdominal pain.  She has no history of heart issues.  She has never had blood clots in her legs nor her lungs    Past Medical History:  Diagnosis  Date  . Bipolar 2 disorder ([place])   . Diabetes mellitus without complication ([place])   . Diverticulitis   . Diverticulosis   . Uterine fibroid     There are no active problems to display for this patient.   Past Surgical History:  Procedure Laterality Date  . BACK SURGERY    . CHOLECYSTECTOMY    . LUMBAR SPINE SURGERY      Prior to Admission medications   Medication Sig Start Date End Date Taking? Authorizing Provider  albuterol (PROVENTIL HFA;VENTOLIN HFA) 108 (90 Base) MCG/ACT inhaler Inhale 2 puffs into the lungs every 6 (six) hours as needed for wheezing or shortness of breath. 05/28/17  Yes Schuyler Amor, MD  ARIPiprazole (ABILIFY) 5 MG tablet Take 1 tablet (5 mg total) by mouth daily. 07/12/17   Hinda Kehr, MD  benzonatate (TESSALON PERLES) 100 MG capsule Take 1 capsule (100 mg total) by mouth every 6 (six) hours as needed for cough. Patient not taking: Reported on 07/12/2017 05/28/17 05/28/18  Schuyler Amor, MD  citalopram (CELEXA) 20 MG tablet Take 20 mg by mouth daily.    [provider]  HYDROcodone-homatropine (HYCODAN) 5-1.5 MG/5ML syrup Take 5 mLs by mouth every 6 (six) hours as needed. Patient not taking: Reported on 07/12/2017 05/22/17   Versie Starks, PA-C  metFORMIN (GLUCOPHAGE) 500 MG tablet Take 1 tablet (500 mg total)  by mouth 2 (two) times daily with a meal. 07/12/17 07/12/18  Hinda Kehr, MD  ondansetron (ZOFRAN ODT) 4 MG disintegrating tablet Take 1 tablet (4 mg total) by mouth every 8 (eight) hours as needed for nausea or vomiting. Patient not taking: Reported on 07/12/2017 04/27/17   Johnn Hai, PA-C  predniSONE (STERAPRED UNI-PAK 21 TAB) 10 MG (21) TBPK tablet Take 6 pills on day one then decrease by 1 pill each day Patient not taking: Reported on 07/12/2017 05/22/17   Versie Starks, PA-C    Allergies Iodinated diagnostic agents  History reviewed. No pertinent family history.  Social History Social History   Tobacco Use  . Smoking status:  Current Every Day Smoker    Packs/day: 0.20    Types: Cigarettes  . Smokeless tobacco: Never Used  Substance Use Topics  . Alcohol use: Yes    Comment: last use 11/16/218  . Drug use: No    Review of Systems Constitutional: No fever/chills Eyes: No visual changes. ENT: No sore throat. Cardiovascular: chest pain as described above Respiratory: Denies shortness of breath. Gastrointestinal: No abdominal pain.  No nausea, no vomiting.  No diarrhea.  No constipation. Genitourinary: Negative for dysuria. Musculoskeletal: Negative for neck pain.  Negative for back pain. Integumentary: Negative for rash. Neurological: Negative for headaches, focal weakness or numbness. Psych: Depression but denies suicidal ideation and homicidal ideation   ____________________________________________   PHYSICAL EXAM:  VITAL SIGNS: ED Triage Vitals  Enc Vitals Group     BP 07/12/17 1405 123/87     Pulse Rate 07/12/17 1405 75     Resp 07/12/17 1405 17     Temp 07/12/17 1405 98.3 F (36.8 C)     Temp Source 07/12/17 1405 Oral     SpO2 07/12/17 1405 98 %     Weight 07/12/17 1358 78.9 kg (174 lb)     Height 07/12/17 1358 1.702 m (5\' 7" )     Head Circumference --      Peak Flow --      Pain Score 07/12/17 1357 10     Pain Loc --      Pain Edu? --      Excl. in [place]? --     Constitutional: Alert and oriented. Well appearing and in no acute distress. Eyes: Conjunctivae are normal.  Head: Atraumatic. Nose: No congestion/rhinnorhea. Mouth/Throat: Mucous membranes are moist. Neck: No stridor.  No meningeal signs.   Cardiovascular: Normal rate, regular rhythm. Good peripheral circulation. Grossly normal heart sounds. Respiratory: Normal respiratory effort.  No retractions. Lungs CTAB. Gastrointestinal: Soft and nontender. No distention.  Musculoskeletal: No lower extremity tenderness nor edema. No gross deformities of extremities. Neurologic:  Normal speech and language. No gross focal neurologic  deficits are appreciated.  Skin:  Skin is warm, dry and intact. No rash noted. Psychiatric: Mood and affect are depressed and tearful.  However the patient has good insight into her situation and denies suicidal ideation and  homicidal ideation ____________________________________________   LABS (all labs ordered are listed, but only abnormal results are displayed)  Labs Reviewed  BASIC METABOLIC PANEL - Abnormal; Notable for the following components:      Result Value   Glucose, Bld 535 (*)    All other components within normal limits  URINALYSIS, COMPLETE (UACMP) WITH MICROSCOPIC - Abnormal; Notable for the following components:   Color, Urine YELLOW (*)    APPearance HAZY (*)    Glucose, UA >=500 (*)    Nitrite POSITIVE (*)  Bacteria, UA MANY (*)    All other components within normal limits  GLUCOSE, CAPILLARY - Abnormal; Notable for the following components:   Glucose-Capillary >600 (*)    All other components within normal limits  GLUCOSE, CAPILLARY - Abnormal; Notable for the following components:   Glucose-Capillary 496 (*)    All other components within normal limits  GLUCOSE, CAPILLARY - Abnormal; Notable for the following components:   Glucose-Capillary 433 (*)    All other components within normal limits  GLUCOSE, CAPILLARY - Abnormal; Notable for the following components:   Glucose-Capillary 367 (*)    All other components within normal limits  CBC  TROPONIN I  POC URINE PREG, ED  POCT PREGNANCY, URINE   ____________________________________________  EKG  ED ECG REPORT I, Hinda Kehr, the attending physician, personally viewed and interpreted this ECG.  Date: 07/12/2017 EKG Time: 14:03 Rate: 73 Rhythm: normal sinus rhythm QRS Axis: normal Intervals: normal ST/T Wave abnormalities: Non-specific ST segment / T-wave changes, but no evidence of acute ischemia. Narrative Interpretation: no evidence of acute  ischemia   ____________________________________________  RADIOLOGY I, Hinda Kehr, personally viewed and evaluated these images (plain radiographs) as part of my medical decision making, as well as reviewing the written report by the radiologist.  ED MD interpretation: No evidence of acute cardiopulmonary process on chest x-ray  Official radiology report(s): Dg Chest 2 View  Result Date: 07/12/2017 CLINICAL DATA:  Cough since April, 2019. EXAM: CHEST - 2 VIEW COMPARISON:  PA and lateral chest 05/28/2017, 05/22/2017 and 08/16/2016. FINDINGS: Lungs are clear. Heart size is normal. No pneumothorax or pleural effusion. No bony abnormality. IMPRESSION: Negative chest. Electronically Signed   By: Inge Rise M.D.   On: 07/12/2017 14:30   Ct Head Wo Contrast  Result Date: 07/12/2017 CLINICAL DATA:  52 y/o F; altered mentation, hyperglycemia, head injury several days ago. EXAM: CT HEAD WITHOUT CONTRAST TECHNIQUE: Contiguous axial images were obtained from the base of the skull through the vertex without intravenous contrast. COMPARISON:  03/23/2016 CT head. FINDINGS: Brain: No evidence of acute infarction, hemorrhage, hydrocephalus, extra-axial collection or mass lesion/mass effect. Vascular: Calcific atherosclerosis of carotid siphons. No hyperdense vessel. Skull: Normal. Negative for fracture or focal lesion. Sinuses/Orbits: No acute finding. Other: None. IMPRESSION: Stable negative CT of the head. Electronically Signed   By: Kristine Garbe M.D.   On: 07/12/2017 17:03    ____________________________________________   PROCEDURES  Critical Care performed: No   Procedure(s) performed:   Procedures   ____________________________________________   INITIAL IMPRESSION / ASSESSMENT AND PLAN / ED COURSE  As part of my medical decision making, I reviewed the following data within the [facility] notes reviewed and incorporated, Labs reviewed , EKG  interpreted , Old chart reviewed, Radiograph reviewed , A consult was requested and obtained from this/these consultant(s) Psychiatry and Notes from prior ED visits    Differential diagnosis includes, but is not limited to, the usual acute or emergent causes of chest pain including ACS, PE, pneumonia, etc.  However I believe that the patient is experiencing a great deal of stress and depression and likely an acute "exacerbation" of her bipolar disorder.  She has been off of her medication for a couple of months and is tearful and upset.  However she does seem to have good insight into the situation and adamantly denies suicidal ideation.  She repeatedly tells me that she is feeling well and is caring for her mother.  I believe that she  does have the capacity to make her own decisions and I do not believe she meets involuntary commitment criteria.  Additionally, I think they keeping her against her well in the hospital would worsen her home situation and therefore worsen her mental state as well.  I talked to her for a period of time and asked if she feels she would benefit from talking to TTS and tele-psychiatry, and she was interested in doing so voluntarily.  She is low risk for ACS and PE and I do not believe that additional evaluation is warranted at this time.  Her work-up including labs, EKG, chest x-ray, and vital signs are all reassuring and I believe that the symptoms most likely stem from psychiatric problems at this time.  I explained all this to the patient she does seem somewhat reassured.  I have ordered telepsych evaluation and will reassess after they provide their recommendations.  Clinical Course as of Jul 12 2304  Sun Jul 12, 2017  1718 No acute abnormalities identified on noncontrast CT head.  I ordered this because even though the patient had not told me initially, apparently she had a fall a few days ago and has had some residual headache and intermittent confusion since that time.  I  suspect that the "confusion" has more to do with the stress and mental health issues with which she is currently dealing, but I felt it was important to evaluate for the possibility of a small subdural.  Fortunately the CT scan was normal.  We are awaiting telepsych evaluation and recommendations.  CT Head Wo Contrast [CF]  1901 I reviewed the written report from the specialist on-call psychiatrist.  They believe that the patient's bipolar type II disorder is the primary issue with mood cycling and hypomania, but agrees that there is no indication for inpatient admission for behavioral medicine treatment or involuntary commitment.  TTS provided some outpatient resources and I am giving information about [facility].  The tele-psychiatrist also recommended restarting Abilify 5 mg by mouth daily which I will do.  I went over all this information with the patient and she understands and agrees with the plan.  As documented above, she is medically clear and I will also restart her on her metformin.  I gave my usual customary return precautions.   [CF]    Clinical Course User Index [CF] Hinda Kehr, MD    ____________________________________________  FINAL CLINICAL IMPRESSION(S) / ED DIAGNOSES  Final diagnoses:  Depression, unspecified depression type  Bipolar 2 disorder ([place])  Atypical chest pain  Type 2 diabetes mellitus with hyperglycemia, without long-term current use of insulin ([place])     MEDICATIONS GIVEN DURING THIS VISIT:  Medications  sodium chloride 0.9 % bolus 1,000 mL (0 mLs Intravenous Stopped 07/12/17 1533)  sodium chloride 0.9 % bolus 1,000 mL (0 mLs Intravenous Stopped 07/12/17 1735)  metFORMIN (GLUCOPHAGE) tablet 500 mg (500 mg Oral Given 07/12/17 1629)     ED Discharge Orders        Ordered    metFORMIN (GLUCOPHAGE) 500 MG tablet  2 times daily with meals     07/12/17 1909    ARIPiprazole (ABILIFY) 5 MG tablet  Daily     07/12/17 1909       Note:  This document was prepared  using Dragon voice recognition software and may include unintentional dictation errors.    Hinda Kehr, MD 07/12/17 2306

## 2017-07-12 NOTE — ED Notes (Addendum)
Pt given something to drink and some tissues. Tearful again.   Calvin with TTS states he gave pt outpatient information for medication/psych follow up.

## 2017-07-12 NOTE — ED Notes (Signed)
Edd Arbour, ED secretary, called for [facility]. Will set [facility] machine up in pt room when it's done being used by another pt.

## 2017-07-13 LAB — GLUCOSE, CAPILLARY

## 2017-08-10 ENCOUNTER — Other Ambulatory Visit: Payer: Self-pay

## 2017-08-10 ENCOUNTER — Emergency Department
Admission: EM | Admit: 2017-08-10 | Discharge: 2017-08-10 | Disposition: A | Discharge status: Home / Self Care | Payer: Self-pay | Attending: Emergency Medicine | Admitting: Emergency Medicine

## 2017-08-10 DIAGNOSIS — Y998 Other external cause status: Secondary | ICD-10-CM | POA: Insufficient documentation

## 2017-08-10 DIAGNOSIS — Y9389 Activity, other specified: Secondary | ICD-10-CM | POA: Insufficient documentation

## 2017-08-10 DIAGNOSIS — Z79899 Other long term (current) drug therapy: Secondary | ICD-10-CM | POA: Insufficient documentation

## 2017-08-10 DIAGNOSIS — F319 Bipolar disorder, unspecified: Secondary | ICD-10-CM | POA: Insufficient documentation

## 2017-08-10 DIAGNOSIS — Z7984 Long term (current) use of oral hypoglycemic drugs: Secondary | ICD-10-CM | POA: Insufficient documentation

## 2017-08-10 DIAGNOSIS — X16XXXA Contact with hot heating appliances, radiators and pipes, initial encounter: Secondary | ICD-10-CM | POA: Insufficient documentation

## 2017-08-10 DIAGNOSIS — T22191A Burn of first degree of multiple sites of right shoulder and upper limb, except wrist and hand, initial encounter: Secondary | ICD-10-CM

## 2017-08-10 DIAGNOSIS — T23171A Burn of first degree of right wrist, initial encounter: Secondary | ICD-10-CM | POA: Insufficient documentation

## 2017-08-10 DIAGNOSIS — E119 Type 2 diabetes mellitus without complications: Secondary | ICD-10-CM | POA: Insufficient documentation

## 2017-08-10 DIAGNOSIS — Z9049 Acquired absence of other specified parts of digestive tract: Secondary | ICD-10-CM | POA: Insufficient documentation

## 2017-08-10 DIAGNOSIS — Y929 Unspecified place or not applicable: Secondary | ICD-10-CM | POA: Insufficient documentation

## 2017-08-10 DIAGNOSIS — F17210 Nicotine dependence, cigarettes, uncomplicated: Secondary | ICD-10-CM | POA: Insufficient documentation

## 2017-08-10 MED ORDER — OXYCODONE-ACETAMINOPHEN 5-325 MG PO TABS
1.0000 | ORAL_TABLET | Freq: Four times a day (QID) | ORAL | Status: DC | PRN
  Administered 2017-08-10 (×2): 1 via ORAL
  Filled 2017-08-10 (×2): qty 1

## 2017-08-10 MED ORDER — SILVER SULFADIAZINE 1 % EX CREA
TOPICAL_CREAM | Freq: Once | CUTANEOUS | Status: AC
  Administered 2017-08-10: 15:00:00 via TOPICAL

## 2017-08-10 MED ORDER — SILVER SULFADIAZINE 1 % EX CREA
TOPICAL_CREAM | CUTANEOUS | Status: AC
  Filled 2017-08-10: qty 85

## 2017-08-10 MED ORDER — OXYCODONE-ACETAMINOPHEN 5-325 MG PO TABS: 1.0000 | ORAL_TABLET | Freq: Three times a day (TID) | ORAL | 0 refills | Status: DC | PRN
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
## 2017-08-10 NOTE — ED Notes (Signed)
Gave pt a chocolate and white milk per RN Sam.

## 2017-08-10 NOTE — ED Notes (Signed)
Medication applied and pt's wrist and arm wrapped with gauze
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
## 2017-08-22 ENCOUNTER — Other Ambulatory Visit: Payer: Self-pay

## 2017-08-22 ENCOUNTER — Encounter: Payer: Self-pay | Admitting: Emergency Medicine

## 2017-08-22 ENCOUNTER — Emergency Department: Payer: Self-pay

## 2017-08-22 ENCOUNTER — Emergency Department
Admission: EM | Admit: 2017-08-22 | Discharge: 2017-08-22 | Disposition: A | Discharge status: Home / Self Care | Payer: Self-pay | Attending: Emergency Medicine | Admitting: Emergency Medicine

## 2017-08-22 DIAGNOSIS — F17210 Nicotine dependence, cigarettes, uncomplicated: Secondary | ICD-10-CM | POA: Insufficient documentation

## 2017-08-22 DIAGNOSIS — F319 Bipolar disorder, unspecified: Secondary | ICD-10-CM | POA: Insufficient documentation

## 2017-08-22 DIAGNOSIS — M5441 Lumbago with sciatica, right side: Secondary | ICD-10-CM | POA: Insufficient documentation

## 2017-08-22 DIAGNOSIS — Z9049 Acquired absence of other specified parts of digestive tract: Secondary | ICD-10-CM | POA: Insufficient documentation

## 2017-08-22 DIAGNOSIS — M5386 Other specified dorsopathies, lumbar region: Secondary | ICD-10-CM

## 2017-08-22 DIAGNOSIS — Z7984 Long term (current) use of oral hypoglycemic drugs: Secondary | ICD-10-CM | POA: Insufficient documentation

## 2017-08-22 DIAGNOSIS — Z79899 Other long term (current) drug therapy: Secondary | ICD-10-CM | POA: Insufficient documentation

## 2017-08-22 DIAGNOSIS — E119 Type 2 diabetes mellitus without complications: Secondary | ICD-10-CM | POA: Insufficient documentation

## 2017-08-22 LAB — GLUCOSE, CAPILLARY
Glucose-Capillary: 368 mg/dL — ABNORMAL HIGH (ref 70–99)
Glucose-Capillary: 262 mg/dL — ABNORMAL HIGH (ref 70–99)

## 2017-08-22 MED ORDER — HYDROMORPHONE HCL 1 MG/ML IJ SOLN: 1.0000 mg | Freq: Once | INTRAMUSCULAR | Status: DC

## 2017-08-22 MED ORDER — CYCLOBENZAPRINE HCL 10 MG PO TABS: 10.0000 mg | ORAL_TABLET | Freq: Three times a day (TID) | ORAL | 0 refills | Status: DC | PRN

## 2017-08-22 MED ORDER — KETOROLAC TROMETHAMINE 30 MG/ML IJ SOLN: 30.0000 mg | Freq: Once | INTRAMUSCULAR | Status: DC

## 2017-08-22 MED ORDER — HYDROMORPHONE HCL 1 MG/ML IJ SOLN
1.0000 mg | Freq: Once | INTRAMUSCULAR | Status: AC
Start: 2017-08-22 — End: 2017-08-22
  Administered 2017-08-22: 1 mg via INTRAVENOUS
  Filled 2017-08-22: qty 1

## 2017-08-22 MED ORDER — METHYLPREDNISOLONE 4 MG PO TBPK: ORAL_TABLET | ORAL | 0 refills | Status: DC

## 2017-08-22 MED ORDER — KETOROLAC TROMETHAMINE 30 MG/ML IJ SOLN
30.0000 mg | Freq: Once | INTRAMUSCULAR | Status: AC
  Administered 2017-08-22: 30 mg via INTRAVENOUS
  Filled 2017-08-22: qty 1

## 2017-08-22 MED ORDER — TRAMADOL HCL 50 MG PO TABS: 50.0000 mg | ORAL_TABLET | Freq: Two times a day (BID) | ORAL | 0 refills | Status: DC | PRN

## 2017-08-22 MED ORDER — SODIUM CHLORIDE 0.9 % IV BOLUS
1000.0000 mL | Freq: Once | INTRAVENOUS | Status: AC
  Administered 2017-08-22: 1000 mL via INTRAVENOUS
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
## 2017-08-22 NOTE — ED Provider Notes (Signed)
[facility] Emergency Department Provider Note   ____________________________________________   First MD Initiated Contact with Patient 08/22/17 1720     (approximate)  I have reviewed the triage vital signs and the nursing notes.   HISTORY  Chief Complaint No chief complaint on file.    HPI Julie Lambert is a 52 y.o. female patient presents with radicular low back pain to the right lower extremity.  Patient denies injury.  Patient state recently started a new job which requires repetitive sitting and standing.  Patient stated left foot numbness for 3 weeks.  Increased back pain 5 days ago.  Patient rates the pain as a 10/10.  Patient described the pain is "achy".  Patient is a atypical gait secondary to the pain.  Patient denies bladder or bowel dysfunction.  Patient states lumbar spine surgery 19 years ago.  No palliative measures for complaint.  Patient has diabetes and history of noncompliance of medications.  Patient also complained of epigastric pain which increases after eating.  Patient has a history of diverticulosis. Past Medical History:  Diagnosis Date  . Bipolar 2 disorder ([place])   . Diabetes mellitus without complication ([place])   . Diverticulitis   . Diverticulosis   . Uterine fibroid     There are no active problems to display for this patient.   Past Surgical History:  Procedure Laterality Date  . BACK SURGERY    . CHOLECYSTECTOMY    . LUMBAR SPINE SURGERY      Prior to Admission medications   Medication Sig Start Date End Date Taking? Authorizing Provider  albuterol (PROVENTIL HFA;VENTOLIN HFA) 108 (90 Base) MCG/ACT inhaler Inhale 2 puffs into the lungs every 6 (six) hours as needed for wheezing or shortness of breath. 05/28/17   Schuyler Amor, MD  ARIPiprazole (ABILIFY) 5 MG tablet Take 1 tablet (5 mg total) by mouth daily. 07/12/17   Hinda Kehr, MD  benzonatate (TESSALON PERLES) 100 MG capsule Take 1 capsule (100 mg total)  by mouth every 6 (six) hours as needed for cough. Patient not taking: Reported on 07/12/2017 05/28/17 05/28/18  Schuyler Amor, MD  citalopram (CELEXA) 20 MG tablet Take 20 mg by mouth daily.    [provider]  cyclobenzaprine (FLEXERIL) 10 MG tablet Take 1 tablet (10 mg total) by mouth 3 (three) times daily as needed. 08/22/17   Sable Feil, PA-C  HYDROcodone-homatropine [facility]) 5-1.5 MG/5ML syrup Take 5 mLs by mouth every 6 (six) hours as needed. Patient not taking: Reported on 07/12/2017 05/22/17   Versie Starks, PA-C  metFORMIN (GLUCOPHAGE) 500 MG tablet Take 1 tablet (500 mg total) by mouth 2 (two) times daily with a meal. 07/12/17 07/12/18  Hinda Kehr, MD  methylPREDNISolone (MEDROL DOSEPAK) 4 MG TBPK tablet Take Tapered dose as directed 08/22/17   Sable Feil, PA-C  ondansetron (ZOFRAN ODT) 4 MG disintegrating tablet Take 1 tablet (4 mg total) by mouth every 8 (eight) hours as needed for nausea or vomiting. Patient not taking: Reported on 07/12/2017 04/27/17   Johnn Hai, PA-C  oxyCODONE-acetaminophen (PERCOCET) 5-325 MG tablet Take 1 tablet by mouth every 8 (eight) hours as needed for severe pain. 08/10/17 08/10/18  Lavonia Drafts, MD  predniSONE (STERAPRED UNI-PAK 21 TAB) 10 MG (21) TBPK tablet Take 6 pills on day one then decrease by 1 pill each day Patient not taking: Reported on 07/12/2017 05/22/17   Versie Starks, PA-C  traMADol (ULTRAM) 50 MG tablet Take 1  tablet (50 mg total) by mouth every 12 (twelve) hours as needed. 08/22/17   Sable Feil, PA-C    Allergies Iodinated diagnostic agents  No family history on file.  Social History Social History   Tobacco Use  . Smoking status: Current Every Day Smoker    Packs/day: 0.20    Types: Cigarettes  . Smokeless tobacco: Never Used  Substance Use Topics  . Alcohol use: Yes    Comment: last use 11/16/218  . Drug use: No    Review of Systems Constitutional: No fever/chills Eyes: No visual changes. ENT: No  sore throat. Cardiovascular: Denies chest pain. Respiratory: Denies shortness of breath. Gastrointestinal: No abdominal pain.  No nausea, no vomiting.  No diarrhea.  No constipation. Genitourinary: Negative for dysuria. Musculoskeletal: Positive for back pain. Skin: Negative for rash. Neurological: Negative for headaches, but focal  Numbness to right lower extremity. Psychiatric:Bipolar Endocrine:Diabetes Allergic/Immunilogical: Contrast dye ____________________________________________   PHYSICAL EXAM:  VITAL SIGNS: ED Triage Vitals  Enc Vitals Group     BP 08/22/17 1710 (!) 144/95     Pulse Rate 08/22/17 1710 98     Resp 08/22/17 1710 18     Temp 08/22/17 1710 98.7 F (37.1 C)     Temp Source 08/22/17 1710 Oral     SpO2 08/22/17 1710 97 %     Weight 08/22/17 1710 174 lb (78.9 kg)     Height 08/22/17 1710 5\' 7"  (1.702 m)     Head Circumference --      Peak Flow --      Pain Score 08/22/17 1709 10     Pain Loc --      Pain Edu? --      Excl. in [place]? --    Constitutional: Alert and oriented. Well appearing and in no acute distress. Neck: No stridor. Hematological/Lymphatic/Immunilogical: No cervical lymphadenopathy. Cardiovascular: Normal rate, regular rhythm. Grossly normal heart sounds.  Good peripheral circulation. Respiratory: Normal respiratory effort.  No retractions. Lungs CTAB. Gastrointestinal: Soft and nontender. No distention. No abdominal bruits. No CVA tenderness. Genitourinary: Deferred Musculoskeletal: No obvious lumbar spine deformity.  Patient has moderate guarding palpation L1- L5.  Patient has positive straight leg test with the right lower extremity is 70 degrees. Neurologic:  Normal speech and language. No gross focal neurologic deficits are appreciated. No gait instability. Skin:  Skin is warm, dry and intact. No rash noted. Psychiatric: Mood and affect are normal. Speech and behavior are normal.  ____________________________________________    LABS (all labs ordered are listed, but only abnormal results are displayed)  Labs Reviewed  GLUCOSE, CAPILLARY - Abnormal; Notable for the following components:      Result Value   Glucose-Capillary 368 (*)    All other components within normal limits  GLUCOSE, CAPILLARY - Abnormal; Notable for the following components:   Glucose-Capillary 262 (*)    All other components within normal limits  CBG MONITORING, ED   ____________________________________________  EKG   ____________________________________________  RADIOLOGY Degenerative it changes of the lumbar spine at L4 and 5. ED MD interpretation:    Official radiology report(s): No results found.  ____________________________________________   PROCEDURES  Procedure(s) performed: None  Procedures  Critical Care performed: No  ____________________________________________   INITIAL IMPRESSION / ASSESSMENT AND PLAN / ED COURSE  As part of my medical decision making, I reviewed the following data within the electronic MEDICAL RECORD NUMBER    Radicular back pain to the right lower extremity secondary to degenerative changes in the  lumbar spine.  Patient also is hyperglycemic secondary to noncompliance.  Blood glucose decreased from 3 68-2 62 status post 1 L normal saline.  Patient given discharge care instructions.  Patient advised to follow-up with orthopedic for definitive evaluation and treatment.  Patient advised to comply with her metformin at 500 mg twice a day for better control.  Patient given a prescription for tramadol, Flexeril, and a Medrol Dosepak.       ____________________________________________   FINAL CLINICAL IMPRESSION(S) / ED DIAGNOSES  Final diagnoses:  Sciatica of right side associated with disorder of lumbar spine     ED Discharge Orders        Ordered    traMADol (ULTRAM) 50 MG tablet  Every 12 hours PRN     08/22/17 1858    methylPREDNISolone (MEDROL DOSEPAK) 4 MG TBPK tablet      08/22/17 1858    cyclobenzaprine (FLEXERIL) 10 MG tablet  3 times daily PRN     08/22/17 1858       Note:  This document was prepared using Dragon voice recognition software and may include unintentional dictation errors.    Sable Feil, PA-C 08/22/17 [place], [place], MD 08/23/17 [phone]

## 2017-08-22 NOTE — ED Notes (Signed)
See triage  Note  Presents with lower back pain  States pain is now moving into left leg  Also now having some numbness to foot  Ambulates with slight limp

## 2017-09-20 ENCOUNTER — Emergency Department: Payer: Self-pay

## 2017-09-20 ENCOUNTER — Emergency Department
Admission: EM | Admit: 2017-09-20 | Discharge: 2017-09-20 | Disposition: A | Discharge status: Home / Self Care | Payer: Self-pay | Attending: Emergency Medicine | Admitting: Emergency Medicine

## 2017-09-20 DIAGNOSIS — Z7984 Long term (current) use of oral hypoglycemic drugs: Secondary | ICD-10-CM | POA: Insufficient documentation

## 2017-09-20 DIAGNOSIS — E119 Type 2 diabetes mellitus without complications: Secondary | ICD-10-CM | POA: Insufficient documentation

## 2017-09-20 DIAGNOSIS — R102 Pelvic and perineal pain: Secondary | ICD-10-CM | POA: Insufficient documentation

## 2017-09-20 DIAGNOSIS — N939 Abnormal uterine and vaginal bleeding, unspecified: Secondary | ICD-10-CM | POA: Insufficient documentation

## 2017-09-20 DIAGNOSIS — Z79899 Other long term (current) drug therapy: Secondary | ICD-10-CM | POA: Insufficient documentation

## 2017-09-20 DIAGNOSIS — F17210 Nicotine dependence, cigarettes, uncomplicated: Secondary | ICD-10-CM | POA: Insufficient documentation

## 2017-09-20 LAB — COMPREHENSIVE METABOLIC PANEL
ALT: 12 U/L (ref 0–44)
AST: 22 U/L (ref 15–41)
Albumin: 3.4 g/dL — ABNORMAL LOW (ref 3.5–5.0)
Alkaline Phosphatase: 117 U/L (ref 38–126)
Anion gap: 9 (ref 5–15)
BUN: 9 mg/dL (ref 6–20)
CO2: 21 mmol/L — AB (ref 22–32)
Calcium: 8.9 mg/dL (ref 8.9–10.3)
Chloride: 106 mmol/L (ref 98–111)
Creatinine, Ser: 0.57 mg/dL (ref 0.44–1.00)
GFR calc non Af Amer: >60 mL/min (ref >60)
Glucose, Bld: 466 mg/dL — ABNORMAL HIGH (ref 70–99)
POTASSIUM: 4 mmol/L (ref 3.5–5.1)
SODIUM: 136 mmol/L (ref 135–145)
TOTAL PROTEIN: 6.6 g/dL (ref 6.5–8.1)
Total Bilirubin: 0.6 mg/dL (ref 0.3–1.2)

## 2017-09-20 LAB — CBC
HCT: 40.9 % (ref 35.0–47.0)
Hemoglobin: 14 g/dL (ref 12.0–16.0)
MCH: 30.7 pg (ref 26.0–34.0)
MCHC: 34.3 g/dL (ref 32.0–36.0)
MCV: 89.6 fL (ref 80.0–100.0)
Platelets: 230 10*3/uL (ref 150–440)
RBC: 4.57 MIL/uL (ref 3.80–5.20)
RDW: 13.9 % (ref 11.5–14.5)
WBC: 10.1 10*3/uL (ref 3.6–11.0)

## 2017-09-20 LAB — GLUCOSE, CAPILLARY: Glucose-Capillary: 425 mg/dL — ABNORMAL HIGH (ref 70–99)

## 2017-09-20 MED ORDER — KETOROLAC TROMETHAMINE 30 MG/ML IJ SOLN
30.0000 mg | Freq: Once | INTRAMUSCULAR | Status: AC
  Administered 2017-09-20: 30 mg via INTRAVENOUS
  Filled 2017-09-20: qty 1

## 2017-09-20 MED ORDER — SODIUM CHLORIDE 0.9 % IV SOLN
1000.0000 mL | Freq: Once | INTRAVENOUS | Status: AC
  Administered 2017-09-20: 1000 mL via INTRAVENOUS

## 2017-09-20 NOTE — ED Notes (Addendum)
Pt states vaginal bleeding started about 4pm. Pt states she called EMS bc pain was intense. Pt is A/Ox4. Pt states she was dx with PID last week.

## 2017-09-20 NOTE — ED Provider Notes (Signed)
[facility] Emergency Department Provider Note   ____________________________________________    I have reviewed the triage vital signs and the nursing notes.   HISTORY  Chief Complaint Vaginal Bleeding     HPI Julie Lambert is a 52 y.o. female who presents with complaints of vaginal bleeding.  Patient reports over the last 24 hours she has had somewhat heavy vaginal bleeding.  Intermittent cramping in her bilateral lower pelvis.  She reports she has not had a period in Many months.  No fevers and chills.  Reports that she was treated for PID several weeks ago successfully.  Has not taken anything for cramping today   Past Medical History:  Diagnosis Date  . Bipolar 2 disorder ([place])   . Diabetes mellitus without complication ([place])   . Diverticulitis   . Diverticulosis   . Uterine fibroid     There are no active problems to display for this patient.   Past Surgical History:  Procedure Laterality Date  . BACK SURGERY    . CHOLECYSTECTOMY    . LUMBAR SPINE SURGERY      Prior to Admission medications   Medication Sig Start Date End Date Taking? Authorizing Provider  metFORMIN (GLUCOPHAGE) 500 MG tablet Take 1 tablet (500 mg total) by mouth 2 (two) times daily with a meal. 07/12/17 07/12/18 Yes Hinda Kehr, MD  albuterol (PROVENTIL HFA;VENTOLIN HFA) 108 (90 Base) MCG/ACT inhaler Inhale 2 puffs into the lungs every 6 (six) hours as needed for wheezing or shortness of breath. 05/28/17   Schuyler Amor, MD  ARIPiprazole (ABILIFY) 5 MG tablet Take 1 tablet (5 mg total) by mouth daily. Patient not taking: Reported on 09/20/2017 07/12/17   Hinda Kehr, MD  benzonatate (TESSALON PERLES) 100 MG capsule Take 1 capsule (100 mg total) by mouth every 6 (six) hours as needed for cough. Patient not taking: Reported on 07/12/2017 05/28/17 05/28/18  Schuyler Amor, MD  cyclobenzaprine (FLEXERIL) 10 MG tablet Take 1 tablet (10 mg total) by mouth 3 (three) times  daily as needed. 08/22/17   Sable Feil, PA-C  HYDROcodone-homatropine [facility]) 5-1.5 MG/5ML syrup Take 5 mLs by mouth every 6 (six) hours as needed. Patient not taking: Reported on 07/12/2017 05/22/17   Versie Starks, PA-C  methylPREDNISolone (MEDROL DOSEPAK) 4 MG TBPK tablet Take Tapered dose as directed Patient not taking: Reported on 09/20/2017 08/22/17   Sable Feil, PA-C  ondansetron (ZOFRAN ODT) 4 MG disintegrating tablet Take 1 tablet (4 mg total) by mouth every 8 (eight) hours as needed for nausea or vomiting. Patient not taking: Reported on 07/12/2017 04/27/17   Johnn Hai, PA-C  oxyCODONE-acetaminophen (PERCOCET) 5-325 MG tablet Take 1 tablet by mouth every 8 (eight) hours as needed for severe pain. Patient not taking: Reported on 09/20/2017 08/10/17 08/10/18  Lavonia Drafts, MD  predniSONE (STERAPRED UNI-PAK 21 TAB) 10 MG (21) TBPK tablet Take 6 pills on day one then decrease by 1 pill each day Patient not taking: Reported on 07/12/2017 05/22/17   Versie Starks, PA-C  traMADol (ULTRAM) 50 MG tablet Take 1 tablet (50 mg total) by mouth every 12 (twelve) hours as needed. 08/22/17   Sable Feil, PA-C     Allergies Iodinated diagnostic agents  No family history on file.  Social History Social History   Tobacco Use  . Smoking status: Current Every Day Smoker    Packs/day: 0.20    Types: Cigarettes  . Smokeless tobacco: Never Used  Substance Use Topics  . Alcohol use: Yes    Comment: last use 11/16/218  . Drug use: No    Review of Systems  Constitutional: No fever/chills Eyes: No visual changes.  ENT: No sore throat. Cardiovascular: Denies chest pain. Respiratory: Denies shortness of breath. Gastrointestinal: No abdominal pain.   Genitourinary: As above Musculoskeletal: Negative for back pain. Skin: Negative for rash. Neurological: Negative for headaches    ____________________________________________   PHYSICAL EXAM:  VITAL SIGNS: ED Triage Vitals    Enc Vitals Group     BP 09/20/17 0942 (!) 176/96     Pulse Rate 09/20/17 0942 87     Resp --      Temp 09/20/17 0942 98.7 F (37.1 C)     Temp Source 09/20/17 0942 Oral     SpO2 09/20/17 0942 100 %     Weight 09/20/17 0943 78.9 kg (174 lb)     Height 09/20/17 0943 1.702 m (5\' 7" )     Head Circumference --      Peak Flow --      Pain Score 09/20/17 0943 4     Pain Loc --      Pain Edu? --      Excl. in [place]? --     Constitutional: Alert and oriented. No acute distress.  Eyes: Conjunctivae are normal.   Nose: No congestion/rhinnorhea. Mouth/Throat: Mucous membranes are moist.    Cardiovascular: Normal rate, regular rhythm. Grossly normal heart sounds.  Good peripheral circulation. Respiratory: Normal respiratory effort.  No retractions. . GI: no abdominal ttp, no cva ttp GU: deferred at patient request Musculoskeletal: Warm and well perfused Neurologic:  Normal speech and language. No gross focal neurologic deficits are appreciated.  Skin:  Skin is warm, dry and intact. No rash noted. Psychiatric: Mood and affect are normal. Speech and behavior are normal.  ____________________________________________   LABS (all labs ordered are listed, but only abnormal results are displayed)  Labs Reviewed  GLUCOSE, CAPILLARY - Abnormal; Notable for the following components:      Result Value   Glucose-Capillary 425 (*)    All other components within normal limits  COMPREHENSIVE METABOLIC PANEL - Abnormal; Notable for the following components:   CO2 21 (*)    Glucose, Bld 466 (*)    Albumin 3.4 (*)    All other components within normal limits  CBC   ____________________________________________  EKG  None ____________________________________________  RADIOLOGY  Ultrasound demonstrates uterine fibroid, no ovarian ab normalities ____________________________________________   PROCEDURES  Procedure(s) performed: No  Procedures   Critical Care performed:  No ____________________________________________   INITIAL IMPRESSION / ASSESSMENT AND PLAN / ED COURSE  Pertinent labs & imaging results that were available during my care of the patient were reviewed by me and considered in my medical decision making (see chart for details).  Patient presents with vaginal bleeding as described above.  Vital signs are reassuring.  Hemoglobin is unremarkable.  Mildly elevated glucose normal anion gap, given IV fluids.  Known history of diabetes.  Ultrasound performed which demonstrates uterine fibroids but no ovarian abnormalities.  Recommend supportive care, NSAIDs, GYN follow-up    ____________________________________________   FINAL CLINICAL IMPRESSION(S) / ED DIAGNOSES  Final diagnoses:  Pelvic pain  Vaginal bleeding        Note:  This document was prepared using Dragon voice recognition software and may include unintentional dictation errors.    Lavonia Drafts, MD 09/20/17 [phone]

## 2017-09-20 NOTE — ED Notes (Signed)
Pt d/c at this time All questions were answered. PT states she will get a ride home. Pt is placed in lobby at this time. PT is agreeable to D/C

## 2017-09-20 NOTE — ED Notes (Signed)
RN notified us of the pt request to sign waiver of pregnancy. RN will monitor.

## 2017-09-20 NOTE — ED Notes (Signed)
Pt sitting up in bed nibbling on food. RN will D/C after pt eats per verbal order from EDP

## 2017-09-20 NOTE — ED Notes (Signed)
.   Pt is resting, Respirations even and unlabored, NAD. Stretcher lowest postion and locked. Call bell within reach. Denies any needs at this time RN will continue to monitor.  Pt is monitor.

## 2017-09-20 NOTE — ED Notes (Signed)
Verbal order to provide food for pt before d/c. This RN provided food tray at this time.

## 2017-09-20 NOTE — ED Triage Notes (Signed)
Pt presents today via [facility] form home. Pt is a diabetic EMS report CBG 416, with fruity smelling breath.

## 2017-09-20 NOTE — ED Notes (Signed)
Pt was d/c to lobby. Pt denied the need for wheelchair. Pt ambulatory to lobby at this time.

## 2017-09-21 ENCOUNTER — Encounter: Payer: Self-pay | Admitting: *Deleted

## 2017-09-21 ENCOUNTER — Emergency Department
Admission: EM | Admit: 2017-09-21 | Discharge: 2017-09-21 | Disposition: A | Discharge status: Home / Self Care | Payer: Self-pay | Attending: Emergency Medicine | Admitting: Emergency Medicine

## 2017-09-21 ENCOUNTER — Other Ambulatory Visit: Payer: Self-pay

## 2017-09-21 DIAGNOSIS — F17210 Nicotine dependence, cigarettes, uncomplicated: Secondary | ICD-10-CM | POA: Insufficient documentation

## 2017-09-21 DIAGNOSIS — R739 Hyperglycemia, unspecified: Secondary | ICD-10-CM

## 2017-09-21 DIAGNOSIS — Z7984 Long term (current) use of oral hypoglycemic drugs: Secondary | ICD-10-CM | POA: Insufficient documentation

## 2017-09-21 DIAGNOSIS — N939 Abnormal uterine and vaginal bleeding, unspecified: Secondary | ICD-10-CM

## 2017-09-21 DIAGNOSIS — M6281 Muscle weakness (generalized): Secondary | ICD-10-CM | POA: Insufficient documentation

## 2017-09-21 DIAGNOSIS — R1030 Lower abdominal pain, unspecified: Secondary | ICD-10-CM | POA: Insufficient documentation

## 2017-09-21 DIAGNOSIS — E1165 Type 2 diabetes mellitus with hyperglycemia: Secondary | ICD-10-CM | POA: Insufficient documentation

## 2017-09-21 DIAGNOSIS — N938 Other specified abnormal uterine and vaginal bleeding: Secondary | ICD-10-CM | POA: Insufficient documentation

## 2017-09-21 DIAGNOSIS — R5383 Other fatigue: Secondary | ICD-10-CM | POA: Insufficient documentation

## 2017-09-21 DIAGNOSIS — Z79899 Other long term (current) drug therapy: Secondary | ICD-10-CM | POA: Insufficient documentation

## 2017-09-21 LAB — CHLAMYDIA/NGC RT PCR (ARMC ONLY)
Chlamydia Tr: NOT DETECTED
N gonorrhoeae: NOT DETECTED

## 2017-09-21 LAB — COMPREHENSIVE METABOLIC PANEL
ALBUMIN: 3.5 g/dL (ref 3.5–5.0)
ALT: 13 U/L (ref 0–44)
ANION GAP: 11 (ref 5–15)
AST: 24 U/L (ref 15–41)
Alkaline Phosphatase: 128 U/L — ABNORMAL HIGH (ref 38–126)
BILIRUBIN TOTAL: 0.5 mg/dL (ref 0.3–1.2)
BUN: 9 mg/dL (ref 6–20)
CO2: 18 mmol/L — ABNORMAL LOW (ref 22–32)
Calcium: 8.7 mg/dL — ABNORMAL LOW (ref 8.9–10.3)
Chloride: 105 mmol/L (ref 98–111)
Creatinine, Ser: 0.57 mg/dL (ref 0.44–1.00)
GFR calc Af Amer: >60 mL/min (ref >60)
GFR calc non Af Amer: >60 mL/min (ref >60)
GLUCOSE: 521 mg/dL — AB (ref 70–99)
Potassium: 3.9 mmol/L (ref 3.5–5.1)
Sodium: 134 mmol/L — ABNORMAL LOW (ref 135–145)
TOTAL PROTEIN: 6.8 g/dL (ref 6.5–8.1)

## 2017-09-21 LAB — URINALYSIS, COMPLETE (UACMP) WITH MICROSCOPIC
BACTERIA UA: NONE SEEN
Bilirubin Urine: NEGATIVE
Glucose, UA: >=500 mg/dL — AB
KETONES UR: NEGATIVE mg/dL
LEUKOCYTES UA: NEGATIVE
Nitrite: NEGATIVE
PROTEIN: NEGATIVE mg/dL
Specific Gravity, Urine: 1.039 — ABNORMAL HIGH (ref 1.005–1.030)
pH: 5 (ref 5.0–8.0)

## 2017-09-21 LAB — WET PREP, GENITAL
CLUE CELLS WET PREP: NONE SEEN
Sperm: NONE SEEN
TRICH WET PREP: NONE SEEN
YEAST WET PREP: NONE SEEN

## 2017-09-21 LAB — GLUCOSE, CAPILLARY
GLUCOSE-CAPILLARY: 170 mg/dL — AB (ref 70–99)
Glucose-Capillary: 217 mg/dL — ABNORMAL HIGH (ref 70–99)

## 2017-09-21 LAB — LIPASE, BLOOD: Lipase: 31 U/L (ref 11–51)

## 2017-09-21 LAB — CBC
HEMATOCRIT: 42 % (ref 35.0–47.0)
HEMOGLOBIN: 14.4 g/dL (ref 12.0–16.0)
MCH: 30.8 pg (ref 26.0–34.0)
MCHC: 34.2 g/dL (ref 32.0–36.0)
MCV: 89.9 fL (ref 80.0–100.0)
Platelets: 247 10*3/uL (ref 150–440)
RBC: 4.67 MIL/uL (ref 3.80–5.20)
RDW: 13.8 % (ref 11.5–14.5)
WBC: 10.4 10*3/uL (ref 3.6–11.0)

## 2017-09-21 MED ORDER — MORPHINE SULFATE (PF) 4 MG/ML IV SOLN
4.0000 mg | Freq: Once | INTRAVENOUS | Status: AC
  Administered 2017-09-21: 4 mg via INTRAVENOUS
  Filled 2017-09-21: qty 1

## 2017-09-21 MED ORDER — SODIUM CHLORIDE 0.9 % IV BOLUS
1000.0000 mL | Freq: Once | INTRAVENOUS | Status: AC
  Administered 2017-09-21: 1000 mL via INTRAVENOUS

## 2017-09-21 MED ORDER — OXYCODONE-ACETAMINOPHEN 5-325 MG PO TABS
1.0000 | ORAL_TABLET | ORAL | Status: DC | PRN
  Administered 2017-09-21: 1 via ORAL
  Filled 2017-09-21: qty 1

## 2017-09-21 MED ORDER — HYDROCODONE-ACETAMINOPHEN 5-325 MG PO TABS: 1.0000 | ORAL_TABLET | ORAL | 0 refills | Status: DC | PRN

## 2017-09-21 MED ORDER — INSULIN ASPART 100 UNIT/ML ~~LOC~~ SOLN
8.0000 [IU] | Freq: Once | SUBCUTANEOUS | Status: AC
  Administered 2017-09-21: 8 [IU] via INTRAVENOUS
  Filled 2017-09-21: qty 0.08
  Filled 2017-09-21: qty 1

## 2017-09-21 MED ORDER — GLIPIZIDE 5 MG PO TABS: 2.5000 mg | ORAL_TABLET | Freq: Two times a day (BID) | ORAL | 1 refills | Status: DC

## 2017-09-21 NOTE — ED Triage Notes (Addendum)
Pt reports right sided abd pain x 3 weeks with vaginal bleeding that started two days ago. Pt reports the pain and bleeding are constant and she is changing her pads every hour due to the bleeding. Pt was seen yesterday and reports, "they told me I have fibroids but didn't do anything for me, the pain is too bad" Pt reports today she has had increased fatigue and can not stay awake.

## 2017-09-21 NOTE — ED Notes (Signed)
Attempted IV access x2.  ?

## 2017-09-21 NOTE — ED Provider Notes (Signed)
[facility] Emergency Department Provider Note  Time seen: 2:56 PM  I have reviewed the triage vital signs and the nursing notes.   HISTORY  Chief Complaint Abdominal Pain and Vaginal Bleeding    HPI Julie Lambert is a 52 y.o. female with a past medical history of bipolar, diabetes, presents to the emergency department for lower abdominal pain for the past 3 weeks vaginal bleeding, generalized fatigue, weakness, possible pelvic infection, hyperglycemia.  According to the patient for the past 3 to 4 weeks she has been expensing lower abdominal pain, proximal me 2 weeks ago she went to the health department and was told that she had PID was placed on doxycycline continuing to take doxycycline.  Patient states she has had intermittent vaginal bleeding over this time.  As well, somewhat worse over the past 1 week.  States prior to this past week she has not had any vaginal bleeding since January 2018, indicating postmenopausal.  Patient states she takes metformin twice daily has been taking it has been watching her diet but her blood glucose has been over 400 on a daily basis.  Currently 521.  Patient states she does not have a primary care doctor currently to help with her diabetic care, does not have an OB/GYN currently.  Describes her pain is moderate dull aching pain across the lower abdomen.   Past Medical History:  Diagnosis Date  . Bipolar 2 disorder ([place])   . Diabetes mellitus without complication ([place])   . Diverticulitis   . Diverticulosis   . Uterine fibroid     There are no active problems to display for this patient.   Past Surgical History:  Procedure Laterality Date  . BACK SURGERY    . CHOLECYSTECTOMY    . LUMBAR SPINE SURGERY      Prior to Admission medications   Medication Sig Start Date End Date Taking? Authorizing Provider  albuterol (PROVENTIL HFA;VENTOLIN HFA) 108 (90 Base) MCG/ACT inhaler Inhale 2 puffs into the lungs every 6 (six)  hours as needed for wheezing or shortness of breath. 05/28/17   Schuyler Amor, MD  ARIPiprazole (ABILIFY) 5 MG tablet Take 1 tablet (5 mg total) by mouth daily. Patient not taking: Reported on 09/20/2017 07/12/17   Hinda Kehr, MD  benzonatate (TESSALON PERLES) 100 MG capsule Take 1 capsule (100 mg total) by mouth every 6 (six) hours as needed for cough. Patient not taking: Reported on 07/12/2017 05/28/17 05/28/18  Schuyler Amor, MD  cyclobenzaprine (FLEXERIL) 10 MG tablet Take 1 tablet (10 mg total) by mouth 3 (three) times daily as needed. 08/22/17   Sable Feil, PA-C  HYDROcodone-homatropine [facility]) 5-1.5 MG/5ML syrup Take 5 mLs by mouth every 6 (six) hours as needed. Patient not taking: Reported on 07/12/2017 05/22/17   Versie Starks, PA-C  metFORMIN (GLUCOPHAGE) 500 MG tablet Take 1 tablet (500 mg total) by mouth 2 (two) times daily with a meal. 07/12/17 07/12/18  Hinda Kehr, MD  methylPREDNISolone (MEDROL DOSEPAK) 4 MG TBPK tablet Take Tapered dose as directed Patient not taking: Reported on 09/20/2017 08/22/17   Sable Feil, PA-C  ondansetron (ZOFRAN ODT) 4 MG disintegrating tablet Take 1 tablet (4 mg total) by mouth every 8 (eight) hours as needed for nausea or vomiting. Patient not taking: Reported on 07/12/2017 04/27/17   Johnn Hai, PA-C  oxyCODONE-acetaminophen (PERCOCET) 5-325 MG tablet Take 1 tablet by mouth every 8 (eight) hours as needed for severe pain. Patient not taking: Reported  on 09/20/2017 08/10/17 08/10/18  Lavonia Drafts, MD  predniSONE (STERAPRED UNI-PAK 21 TAB) 10 MG (21) TBPK tablet Take 6 pills on day one then decrease by 1 pill each day Patient not taking: Reported on 07/12/2017 05/22/17   Versie Starks, PA-C  traMADol (ULTRAM) 50 MG tablet Take 1 tablet (50 mg total) by mouth every 12 (twelve) hours as needed. 08/22/17   Sable Feil, PA-C    Allergies  Allergen Reactions  . Iodinated Diagnostic Agents Itching    Patient stated she had itching on her back  after IV injection    History reviewed. No pertinent family history.  Social History Social History   Tobacco Use  . Smoking status: Current Every Day Smoker    Packs/day: 0.20    Types: Cigarettes  . Smokeless tobacco: Never Used  Substance Use Topics  . Alcohol use: Yes    Comment: last use 11/16/218  . Drug use: No    Review of Systems Constitutional: Negative for fever. Eyes: Negative for visual complaints ENT: Negative for recent illness/congestion Cardiovascular: Negative for chest pain. Respiratory: Negative for shortness of breath. Gastrointestinal: Moderate dull aching pain across the lower abdomen.  Occasional nausea.  Negative for vomiting or diarrhea. Genitourinary: Denies dysuria or hematuria.  States vaginal bleeding, unaware of any vaginal discharge. Musculoskeletal: Negative for musculoskeletal complaints Skin: Negative for skin complaints  Neurological: Negative for headache All other ROS negative  ____________________________________________   PHYSICAL EXAM:  VITAL SIGNS: ED Triage Vitals  Enc Vitals Group     BP 09/21/17 1219 131/82     Pulse Rate 09/21/17 1219 (!) 103     Resp 09/21/17 1219 16     Temp 09/21/17 1219 97.9 F (36.6 C)     Temp src --      SpO2 09/21/17 1219 98 %     Weight 09/21/17 1220 174 lb (78.9 kg)     Height 09/21/17 1220 5\' 7"  (1.702 m)     Head Circumference --      Peak Flow --      Pain Score 09/21/17 1219 10     Pain Loc --      Pain Edu? --      Excl. in [place]? --     Constitutional: Alert and oriented. Well appearing and in no distress. Eyes: Normal exam ENT   Head: Normocephalic and atraumatic.   Mouth/Throat: Mucous membranes are moist. Cardiovascular: Normal rate, regular rhythm. No murmur Respiratory: Normal respiratory effort without tachypnea nor retractions. Breath sounds are clear  Gastrointestinal: Mild to moderate suprapubic tenderness otherwise benign abdomen without rebound guarding or  distention Musculoskeletal: Nontender with normal range of motion in all extremities.  Neurologic:  Normal speech and language. No gross focal neurologic deficits  Skin:  Skin is warm, dry and intact.  Psychiatric: Mood and affect are normal.   ____________________________________________   INITIAL IMPRESSION / ASSESSMENT AND PLAN / ED COURSE  Pertinent labs & imaging results that were available during my care of the patient were reviewed by me and considered in my medical decision making (see chart for details).  Patient presents to the emergency department for lower abdominal discomfort ongoing for nearly 4 weeks, vaginal bleeding x1 week.  Somewhat complicated medical history diabetic, blood glucose currently 521 has been elevated and over the past few weeks per patient.  She does not have a PCP to help with diabetic management only takes metformin twice daily.  We will start an IV, IV hydrate  and dose insulin to try to decrease the patient's blood glucose.  Patient will require likely glipizide going home.  States she Artie checks her blood glucose at home several times daily.  I also discussed with the patient the importance of having a primary care doctor especially since she is diabetic to help with ongoing medical treatment.  Patient agreeable to plan of care and will follow-up.  Patient has lower abdominal pain, was seen in the emergency department yesterday for the same.  Yesterday patient's lab work is largely within normal limits.  Patient had an ultrasound yesterday showing fibroids however I do not believe this is the cause of the patient's lower abdominal discomfort but could be contributing to the vaginal bleeding.  We will obtain a urinalysis today.  The remainder of the patient's lab work is largely within normal limits.  Given the patient's vaginal bleeding, as she is likely postmenopausal I discussed with patient she needs to follow-up with OB/GYN this week for recheck/reevaluation  and possible endometrial sampling.  Patient is agreeable to this plan of care.  We will dose IV fluids, insulin, pain and nausea medication and reassess.  Patient did state 2 weeks ago she was seen at the health department for the same lower abdominal pain was told that she likely had PID.  Patient does admit to sexual activity within the past 6 months.  We will repeat a pelvic exam here in the emergency department and recent swabs to help ensure no ongoing pelvic infection.  Patient denies any significant discharge but states she is having vaginal bleeding so she cannot be sure.  Patient's blood glucose 217.  Wet prep is largely negative.  GC chlamydia pending but the patient is Artie been treated by the health department 2 weeks ago with no discharge on my examination and only very mild vaginal bleeding currently.  We will discharge with a very short course of pain medication.  I discussed the importance again of following with a primary care doctor as well as GYN.  Patient agreeable to plan of care.  We will also start on low-dose glipizide 2.5 mg daily.  I discussed with the patient check her blood glucose several times daily and do not take the glipizide in the morning if her blood glucose is less than 200.  Patient agreeable to plan of care.    ____________________________________________   FINAL CLINICAL IMPRESSION(S) / ED DIAGNOSES  Lower abdominal pain Hyperglycemia Dysfunctional uterine bleeding    Harvest Dark, MD 09/21/17 1734

## 2017-09-23 LAB — URINE CULTURE: CULTURE: NO GROWTH

## 2017-11-25 ENCOUNTER — Encounter: Payer: Self-pay | Admitting: *Deleted

## 2017-11-25 ENCOUNTER — Emergency Department: Payer: Self-pay

## 2017-11-25 ENCOUNTER — Emergency Department
Admission: EM | Admit: 2017-11-25 | Discharge: 2017-11-25 | Disposition: A | Discharge status: Home / Self Care | Payer: Self-pay | Attending: Emergency Medicine | Admitting: Emergency Medicine

## 2017-11-25 ENCOUNTER — Other Ambulatory Visit: Payer: Self-pay

## 2017-11-25 DIAGNOSIS — Z79899 Other long term (current) drug therapy: Secondary | ICD-10-CM | POA: Insufficient documentation

## 2017-11-25 DIAGNOSIS — A5901 Trichomonal vulvovaginitis: Secondary | ICD-10-CM | POA: Insufficient documentation

## 2017-11-25 DIAGNOSIS — E119 Type 2 diabetes mellitus without complications: Secondary | ICD-10-CM | POA: Insufficient documentation

## 2017-11-25 DIAGNOSIS — R109 Unspecified abdominal pain: Secondary | ICD-10-CM

## 2017-11-25 DIAGNOSIS — Z7984 Long term (current) use of oral hypoglycemic drugs: Secondary | ICD-10-CM | POA: Insufficient documentation

## 2017-11-25 DIAGNOSIS — F17210 Nicotine dependence, cigarettes, uncomplicated: Secondary | ICD-10-CM | POA: Insufficient documentation

## 2017-11-25 DIAGNOSIS — N730 Acute parametritis and pelvic cellulitis: Secondary | ICD-10-CM

## 2017-11-25 DIAGNOSIS — N739 Female pelvic inflammatory disease, unspecified: Secondary | ICD-10-CM | POA: Insufficient documentation

## 2017-11-25 LAB — COMPREHENSIVE METABOLIC PANEL
ALBUMIN: 3.5 g/dL (ref 3.5–5.0)
ALK PHOS: 112 U/L (ref 38–126)
ALT: 13 U/L (ref 0–44)
AST: 16 U/L (ref 15–41)
Anion gap: 9 (ref 5–15)
BILIRUBIN TOTAL: 0.3 mg/dL (ref 0.3–1.2)
BUN: 8 mg/dL (ref 6–20)
CALCIUM: 8.4 mg/dL — AB (ref 8.9–10.3)
CO2: 23 mmol/L (ref 22–32)
Chloride: 103 mmol/L (ref 98–111)
Creatinine, Ser: 0.6 mg/dL (ref 0.44–1.00)
GFR calc Af Amer: >60 mL/min (ref >60)
GFR calc non Af Amer: >60 mL/min (ref >60)
GLUCOSE: 421 mg/dL — AB (ref 70–99)
Potassium: 3.7 mmol/L (ref 3.5–5.1)
Sodium: 135 mmol/L (ref 135–145)
TOTAL PROTEIN: 6.5 g/dL (ref 6.5–8.1)

## 2017-11-25 LAB — CBC WITH DIFFERENTIAL/PLATELET
Abs Immature Granulocytes: 0.03 10*3/uL (ref 0.00–0.07)
BASOS PCT: 1 %
Basophils Absolute: 0.1 10*3/uL (ref 0.0–0.1)
EOS PCT: 1 %
Eosinophils Absolute: 0.1 10*3/uL (ref 0.0–0.5)
HCT: 39.2 % (ref 36.0–46.0)
HEMOGLOBIN: 13.5 g/dL (ref 12.0–15.0)
Immature Granulocytes: 0 %
LYMPHS PCT: 36 %
Lymphs Abs: 3 10*3/uL (ref 0.7–4.0)
MCH: 31.4 pg (ref 26.0–34.0)
MCHC: 34.4 g/dL (ref 30.0–36.0)
MCV: 91.2 fL (ref 80.0–100.0)
MONO ABS: 0.9 10*3/uL (ref 0.1–1.0)
Monocytes Relative: 11 %
NEUTROS ABS: 4.4 10*3/uL (ref 1.7–7.7)
NRBC: 0 % (ref 0.0–0.2)
Neutrophils Relative %: 51 %
PLATELETS: 284 10*3/uL (ref 150–400)
RBC: 4.3 MIL/uL (ref 3.87–5.11)
RDW: 13.2 % (ref 11.5–15.5)
WBC: 8.4 10*3/uL (ref 4.0–10.5)

## 2017-11-25 LAB — CHLAMYDIA/NGC RT PCR (ARMC ONLY)
CHLAMYDIA TR: NOT DETECTED
N gonorrhoeae: NOT DETECTED

## 2017-11-25 LAB — URINALYSIS, COMPLETE (UACMP) WITH MICROSCOPIC
BILIRUBIN URINE: NEGATIVE
Glucose, UA: >=500 mg/dL — AB
Ketones, ur: NEGATIVE mg/dL
Nitrite: NEGATIVE
PROTEIN: NEGATIVE mg/dL
RBC / HPF: >50 RBC/hpf — ABNORMAL HIGH (ref 0–5)
SPECIFIC GRAVITY, URINE: 1.018 (ref 1.005–1.030)
WBC, UA: >50 WBC/hpf — ABNORMAL HIGH (ref 0–5)
pH: 6 (ref 5.0–8.0)

## 2017-11-25 LAB — LIPASE, BLOOD: Lipase: 20 U/L (ref 11–51)

## 2017-11-25 LAB — WET PREP, GENITAL
SPERM: NONE SEEN
Yeast Wet Prep HPF POC: NONE SEEN

## 2017-11-25 LAB — POCT PREGNANCY, URINE: Preg Test, Ur: NEGATIVE

## 2017-11-25 MED ORDER — DOXYCYCLINE HYCLATE 100 MG PO CAPS: 100.0000 mg | ORAL_CAPSULE | Freq: Two times a day (BID) | ORAL | 0 refills | Status: AC

## 2017-11-25 MED ORDER — KETOROLAC TROMETHAMINE 30 MG/ML IJ SOLN
15.0000 mg | Freq: Once | INTRAMUSCULAR | Status: AC
  Administered 2017-11-25: 15 mg via INTRAVENOUS
  Filled 2017-11-25: qty 1

## 2017-11-25 MED ORDER — MORPHINE SULFATE (PF) 4 MG/ML IV SOLN
4.0000 mg | Freq: Once | INTRAVENOUS | Status: AC
  Administered 2017-11-25: 4 mg via INTRAVENOUS

## 2017-11-25 MED ORDER — MORPHINE SULFATE (PF) 4 MG/ML IV SOLN
4.0000 mg | Freq: Once | INTRAVENOUS | Status: AC
  Administered 2017-11-25: 4 mg via INTRAVENOUS
  Filled 2017-11-25: qty 1

## 2017-11-25 MED ORDER — ONDANSETRON 4 MG PO TBDP: 4.0000 mg | ORAL_TABLET | Freq: Three times a day (TID) | ORAL | 0 refills | Status: DC | PRN

## 2017-11-25 MED ORDER — MORPHINE SULFATE (PF) 4 MG/ML IV SOLN
INTRAVENOUS | Status: AC
  Administered 2017-11-25: 4 mg via INTRAVENOUS
  Filled 2017-11-25: qty 1

## 2017-11-25 MED ORDER — SODIUM CHLORIDE 0.9 % IV BOLUS
1000.0000 mL | Freq: Once | INTRAVENOUS | Status: AC
  Administered 2017-11-25: 1000 mL via INTRAVENOUS

## 2017-11-25 MED ORDER — DOXYCYCLINE HYCLATE 100 MG PO TABS
100.0000 mg | ORAL_TABLET | Freq: Once | ORAL | Status: AC
  Administered 2017-11-25: 100 mg via ORAL
  Filled 2017-11-25: qty 1

## 2017-11-25 MED ORDER — SODIUM CHLORIDE 0.9 % IV SOLN
1.0000 g | Freq: Once | INTRAVENOUS | Status: AC
  Administered 2017-11-25: 1 g via INTRAVENOUS
  Filled 2017-11-25: qty 10

## 2017-11-25 MED ORDER — METRONIDAZOLE 500 MG PO TABS: 500.0000 mg | ORAL_TABLET | Freq: Two times a day (BID) | ORAL | 0 refills | Status: AC

## 2017-11-25 MED ORDER — ONDANSETRON HCL 4 MG/2ML IJ SOLN
4.0000 mg | Freq: Once | INTRAMUSCULAR | Status: AC
  Administered 2017-11-25: 4 mg via INTRAVENOUS
  Filled 2017-11-25: qty 2

## 2017-11-25 MED ORDER — TRAMADOL HCL 50 MG PO TABS: 50.0000 mg | ORAL_TABLET | Freq: Four times a day (QID) | ORAL | 0 refills | Status: DC | PRN

## 2017-11-25 MED ORDER — METRONIDAZOLE 500 MG PO TABS
500.0000 mg | ORAL_TABLET | Freq: Once | ORAL | Status: AC
  Administered 2017-11-25: 500 mg via ORAL
  Filled 2017-11-25: qty 1

## 2017-11-25 NOTE — Discharge Instructions (Addendum)
You have been seen in the Emergency Department (ED) for abdominal pain.  Your evaluation is concerning for STD/ trichomonas. Take antibiotics as prescribed.  Follow up with your doctor in 12-24 hours if you are still having abdominal pain. Otherwise follow up in 1-3 days for a re-check  Return for right lower abdominal pain, fever, or worsening symptoms.  Don't ignore new symptoms, such as fever, nausea and vomiting, new or worsening abdominal pain, urination problems, bloody diarrhea or bloody stools, black tarry stools, uncontrollable nausea and vomiting, and dizziness. These may be signs of a more serious problem. If you develop any of these you should be seen by your doctor immediately or return to the ED.   How can you care for yourself at home?  Rest until you feel better.  To prevent dehydration, drink plenty of fluids, enough so that your urine is light yellow or clear like water. Choose water and other caffeine-free clear liquids until you feel better. If you have kidney, heart, or liver disease and have to limit fluids, talk with your doctor before you increase the amount of fluids you drink.  If your stomach is upset, eat mild foods, such as rice, dry toast or crackers, bananas, and applesauce. Try eating several small meals instead of two or three large ones.  Wait until 48 hours after all symptoms have gone away before you have spicy foods, alcohol, and drinks that contain caffeine.  Do not eat foods that are high in fat.  Avoid anti-inflammatory medicines such as aspirin, ibuprofen (Advil, Motrin), and naproxen (Aleve). These can cause stomach upset. Talk to your doctor if you take daily aspirin for another health problem.  When should you call for help?  Call 911 anytime you think you may need emergency care. For example, call if:  You passed out (lost consciousness).  You pass maroon or very bloody stools.  You vomit blood or what looks like coffee grounds.  You have new, severe  belly pain.  Call your doctor now or seek immediate medical care if:  Your pain gets worse, especially if it becomes focused in one area of your belly.  You have a new or higher fever.  Your stools are black and look like tar, or they have streaks of blood.  You have unexpected vaginal bleeding.  You have symptoms of a urinary tract infection. These may include:  Pain when you urinate.  Urinating more often than usual.  Blood in your urine. You are dizzy or lightheaded, or you feel like you may faint. Watch closely for changes in your health, and be sure to contact your doctor if:  You are not getting better after 1 day (24 hours).

## 2017-11-25 NOTE — ED Notes (Signed)
Patient tearful during triage/assessment, stating "Im practically homeless living with family, I have lost everything"

## 2017-11-25 NOTE — ED Notes (Signed)
Patient transported to Ultrasound 

## 2017-11-25 NOTE — ED Notes (Signed)
MD at bedside with pt. Pt was tearful with RN before MD arrival to room stating she was almost homeless and under increased stress due to "a house full of hateful people."

## 2017-11-25 NOTE — ED Triage Notes (Addendum)
Pt to ED via EMS from home reporting right sided abd pain x 3 days after eating [employer]ine [employer]. Tenderness upon palpation. hx of diverticulitis. Pt still has her appendix.  Pt also reporting to RN upon arrival that she is having painful yellow green vaginal discharge. No dysuria reported.   4mg  Zofran with EMS given with no relief. 4 more mg given for a total of 8mg  Zofran  406 CBG

## 2017-11-25 NOTE — ED Provider Notes (Signed)
[facility] Emergency Department Provider Note  ____________________________________________  Time seen: Approximately 1:56 PM  I have reviewed the triage vital signs and the nursing notes.   HISTORY  Chief Complaint Abdominal Pain   HPI Julie Lambert is a 52 y.o. female with a history of bipolar disorder, diverticulitis, trichomonas, diabetes who presents for evaluation of abdominal pain.  Patient reports 3 days of diffuse abdominal pain that she describes as sharp and intermittent.  She has had nausea and several episodes of nonbloody nonbilious emesis.  She reports that her symptoms started several hours after eating Janine [place].  She denies fever or chills, diarrhea or constipation.  Last BM was yesterday and it was normal.  She is also complaining of a heavy painful yellow vaginal discharge that has been present for one week.  She reports having trichomonas in the past and it feels like it to her.  She has had cholecystectomy but no other abdominal surgeries.  She is passing flatus with no prior history of SBO.  Past Medical History:  Diagnosis Date  . Bipolar 2 disorder ([place])   . Diabetes mellitus without complication ([place])   . Diverticulitis   . Diverticulosis   . Uterine fibroid     There are no active problems to display for this patient.   Past Surgical History:  Procedure Laterality Date  . BACK SURGERY    . CHOLECYSTECTOMY    . LUMBAR SPINE SURGERY      Prior to Admission medications   Medication Sig Start Date End Date Taking? Authorizing Provider  albuterol (PROVENTIL HFA;VENTOLIN HFA) 108 (90 Base) MCG/ACT inhaler Inhale 2 puffs into the lungs every 6 (six) hours as needed for wheezing or shortness of breath. 05/28/17   Schuyler Amor, MD  ARIPiprazole (ABILIFY) 5 MG tablet Take 1 tablet (5 mg total) by mouth daily. Patient not taking: Reported on 09/20/2017 07/12/17   Hinda Kehr, MD  benzonatate (TESSALON PERLES) 100 MG capsule  Take 1 capsule (100 mg total) by mouth every 6 (six) hours as needed for cough. Patient not taking: Reported on 07/12/2017 05/28/17 05/28/18  Schuyler Amor, MD  cyclobenzaprine (FLEXERIL) 10 MG tablet Take 1 tablet (10 mg total) by mouth 3 (three) times daily as needed. 08/22/17   Sable Feil, PA-C  doxycycline (VIBRAMYCIN) 100 MG capsule Take 1 capsule (100 mg total) by mouth 2 (two) times daily for 10 days. 11/25/17 12/05/17  Rudene Re, MD  glipiZIDE (GLUCOTROL) 5 MG tablet Take 0.5 tablets (2.5 mg total) by mouth 2 (two) times daily. 09/21/17 11/20/17  Harvest Dark, MD  HYDROcodone-acetaminophen (NORCO/VICODIN) 5-325 MG tablet Take 1 tablet by mouth every 4 (four) hours as needed. 09/21/17   Harvest Dark, MD  HYDROcodone-homatropine (HYCODAN) 5-1.5 MG/5ML syrup Take 5 mLs by mouth every 6 (six) hours as needed. Patient not taking: Reported on 07/12/2017 05/22/17   Versie Starks, PA-C  metFORMIN (GLUCOPHAGE) 500 MG tablet Take 1 tablet (500 mg total) by mouth 2 (two) times daily with a meal. 07/12/17 07/12/18  Hinda Kehr, MD  methylPREDNISolone (MEDROL DOSEPAK) 4 MG TBPK tablet Take Tapered dose as directed Patient not taking: Reported on 09/20/2017 08/22/17   Sable Feil, PA-C  metroNIDAZOLE (FLAGYL) 500 MG tablet Take 1 tablet (500 mg total) by mouth 2 (two) times daily for 7 days. 11/25/17 12/02/17  Rudene Re, MD  ondansetron (ZOFRAN ODT) 4 MG disintegrating tablet Take 1 tablet (4 mg total) by mouth every 8 (eight)  hours as needed for nausea or vomiting. 11/25/17   Rudene Re, MD  oxyCODONE-acetaminophen (PERCOCET) 5-325 MG tablet Take 1 tablet by mouth every 8 (eight) hours as needed for severe pain. Patient not taking: Reported on 09/20/2017 08/10/17 08/10/18  Lavonia Drafts, MD  predniSONE (STERAPRED UNI-PAK 21 TAB) 10 MG (21) TBPK tablet Take 6 pills on day one then decrease by 1 pill each day Patient not taking: Reported on 07/12/2017 05/22/17   Versie Starks, PA-C  traMADol (ULTRAM) 50 MG tablet Take 1 tablet (50 mg total) by mouth every 6 (six) hours as needed. 11/25/17 11/25/18  Rudene Re, MD    Allergies Iodinated diagnostic agents  History reviewed. No pertinent family history.  Social History Social History   Tobacco Use  . Smoking status: Current Every Day Smoker    Packs/day: 0.20    Types: Cigarettes  . Smokeless tobacco: Never Used  Substance Use Topics  . Alcohol use: Yes    Comment: last use 11/16/218  . Drug use: No    Review of Systems  Constitutional: Negative for fever. Eyes: Negative for visual changes. ENT: Negative for sore throat. Neck: No neck pain  Cardiovascular: Negative for chest pain. Respiratory: Negative for shortness of breath. Gastrointestinal: + diffuse abdominal pain, nausea, and vomiting. No diarrhea. Genitourinary: Negative for dysuria. + vaginal discharge Musculoskeletal: Negative for back pain. Skin: Negative for rash. Neurological: Negative for headaches, weakness or numbness. Psych: No SI or HI  ____________________________________________   PHYSICAL EXAM:  VITAL SIGNS: ED Triage Vitals  Enc Vitals Group     BP 11/25/17 1327 (!) 145/107     Pulse Rate 11/25/17 1327 (!) 108     Resp 11/25/17 1327 16     Temp 11/25/17 1327 98.5 F (36.9 C)     Temp Source 11/25/17 1327 Oral     SpO2 11/25/17 1327 99 %     Weight 11/25/17 1330 152 lb (68.9 kg)     Height --      Head Circumference --      Peak Flow --      Pain Score 11/25/17 1329 10     Pain Loc --      Pain Edu? --      Excl. in [place]? --     Constitutional: Alert and oriented. Well appearing and in no apparent distress. HEENT:      Head: Normocephalic and atraumatic.         Eyes: Conjunctivae are normal. Sclera is non-icteric.       Mouth/Throat: Mucous membranes are moist.       Neck: Supple with no signs of meningismus. Cardiovascular: Tachycardic with regular rhythm. No murmurs, gallops, or rubs. 2+  symmetrical distal pulses are present in all extremities. No JVD. Respiratory: Normal respiratory effort. Lungs are clear to auscultation bilaterally. No wheezes, crackles, or rhonchi.  Gastrointestinal: Soft, diffuse tenderness to palpation, and non distended with positive bowel sounds. No rebound or guarding. Genitourinary: No CVA tenderness.  Pelvic exam: Swollen erythematous external genitalia, large amount of green/ yellow discharge with significant CMT  Musculoskeletal: Nontender with normal range of motion in all extremities. No edema, cyanosis, or erythema of extremities. Neurologic: Normal speech and language. Face is symmetric. Moving all extremities. No gross focal neurologic deficits are appreciated. Skin: Skin is warm, dry and intact. No rash noted. Psychiatric: Mood and affect are normal. Speech and behavior are normal.  ____________________________________________   LABS (all labs ordered are listed, but only abnormal  results are displayed)  Labs Reviewed  WET PREP, GENITAL - Abnormal; Notable for the following components:      Result Value   Trich, Wet Prep PRESENT (*)    Clue Cells Wet Prep HPF POC PRESENT (*)    WBC, Wet Prep HPF POC TOO NUMEROUS TO COUNT (*)    All other components within normal limits  COMPREHENSIVE METABOLIC PANEL - Abnormal; Notable for the following components:   Glucose, Bld 421 (*)    Calcium 8.4 (*)    All other components within normal limits  URINALYSIS, COMPLETE (UACMP) WITH MICROSCOPIC - Abnormal; Notable for the following components:   Color, Urine YELLOW (*)    APPearance CLOUDY (*)    Glucose, UA >=500 (*)    Hgb urine dipstick SMALL (*)    Leukocytes, UA LARGE (*)    RBC / HPF >50 (*)    WBC, UA >50 (*)    Bacteria, UA RARE (*)    All other components within normal limits  CHLAMYDIA/NGC RT PCR ([facility] ONLY)  LIPASE, BLOOD  CBC WITH DIFFERENTIAL/PLATELET  POC URINE PREG, ED  POCT PREGNANCY, URINE    ____________________________________________  EKG  ED ECG REPORT I, Rudene Re, the attending physician, personally viewed and interpreted this ECG.  Normal sinus rhythm, rate of 91, normal intervals, left axis deviation, Q waves in inferior and anterior leads, no ST elevations or depressions. No significant changes from prior ____________________________________________  RADIOLOGY  I have personally reviewed the images performed during this visit and I agree with the Radiologist's read.   Interpretation by Radiologist:  US Pelvis Transvanginal Non-ob (tv Only)  Result Date: 11/25/2017 CLINICAL DATA:  Initial evaluation for acute abdominal pain, evaluate for TOA. EXAM: TRANSABDOMINAL AND TRANSVAGINAL ULTRASOUND OF PELVIS TECHNIQUE: Both transabdominal and transvaginal ultrasound examinations of the pelvis were performed. Transabdominal technique was performed for global imaging of the pelvis including uterus, ovaries, adnexal regions, and pelvic cul-de-sac. It was necessary to proceed with endovaginal exam following the transabdominal exam to visualize the uterus, endometrium, and ovaries. COMPARISON:  Prior ultrasound from 09/20/2017. FINDINGS: Uterus Measurements: 7.5 x 4.1 x 5.4 cm. 2.6 x 2.0 x 2.9 cm exophytic fibroid seen extending from the anterior uterine fundus. 2.9 x 1.9 x 2.5 cm intramural fibroid at the left lateral anterior uterine body. 1.5 x 1.7 x 1.8 cm intramural fibroid at the right posterior uterine body. Endometrium Thickness: 6 mm.  No focal abnormality visualized. Right ovary Not visualized.  No adnexal mass. Left ovary Measurements: 2.5 x 0.9 x 1.9 cm. Normal appearance/no adnexal mass. Other findings No abnormal free fluid. IMPRESSION: 1. Fibroid uterus as detailed above. 2. No other acute abnormality within the pelvis. 3. Normal left ovary. Nonvisualization of the right ovary. No adnexal mass or free fluid. Electronically Signed   By: Jeannine Boga M.D.    On: 11/25/2017 18:49   US Pelvis (transabdominal Only)  Result Date: 11/25/2017 CLINICAL DATA:  Initial evaluation for acute abdominal pain, evaluate for TOA. EXAM: TRANSABDOMINAL AND TRANSVAGINAL ULTRASOUND OF PELVIS TECHNIQUE: Both transabdominal and transvaginal ultrasound examinations of the pelvis were performed. Transabdominal technique was performed for global imaging of the pelvis including uterus, ovaries, adnexal regions, and pelvic cul-de-sac. It was necessary to proceed with endovaginal exam following the transabdominal exam to visualize the uterus, endometrium, and ovaries. COMPARISON:  Prior ultrasound from 09/20/2017. FINDINGS: Uterus Measurements: 7.5 x 4.1 x 5.4 cm. 2.6 x 2.0 x 2.9 cm exophytic fibroid seen extending from the anterior uterine fundus.  2.9 x 1.9 x 2.5 cm intramural fibroid at the left lateral anterior uterine body. 1.5 x 1.7 x 1.8 cm intramural fibroid at the right posterior uterine body. Endometrium Thickness: 6 mm.  No focal abnormality visualized. Right ovary Not visualized.  No adnexal mass. Left ovary Measurements: 2.5 x 0.9 x 1.9 cm. Normal appearance/no adnexal mass. Other findings No abnormal free fluid. IMPRESSION: 1. Fibroid uterus as detailed above. 2. No other acute abnormality within the pelvis. 3. Normal left ovary. Nonvisualization of the right ovary. No adnexal mass or free fluid. Electronically Signed   By: Jeannine Boga M.D.   On: 11/25/2017 18:49      ____________________________________________   PROCEDURES  Procedure(s) performed: None Procedures Critical Care performed:  None ____________________________________________   INITIAL IMPRESSION / ASSESSMENT AND PLAN / ED COURSE  52 y.o. female with a history of bipolar disorder, diverticulitis, trichomonas, diabetes who presents for evaluation of 3 days of diffuse cramping abdominal pain, N/V, and vaginal discharge.  Patient has diffuse abdominal tenderness on exam, vitals showing mild  tachycardia most likely from dehydration, afebrile otherwise. Pelvic exam shows a large amount of green/yellow discharge with erythema and swelling of external genitalia and significant CMT.  Ddx TOA vs PID vs STD vs diverticulitis versus food poisoning vs appendicitis vs cystitis. Plan for labs, UA, Upreg, wet prep, Gc/chlamydia, TVUS. Will give rocephin and start patient on doxy.  Clinical Course as of Nov 25 2000  Wed Nov 25, 2017  1955 [place] negative for [facility]. Wet prep positive for trichomonas. Patient tolerating PO, feels markedly improved. Will dc home on doxy for PID, flagyl, zofran, and close f/u with PCP. Serial abdominal exam with no localizing tenderness, especially no RLQ tenderness. Low clinical suspicion for appendicitis. Dicussed my standard return precautions for abdominal pain. Safe sex discussed.   [CV]    Clinical Course User Index [CV] Alfred Levins [place], MD     As part of my medical decision making, I reviewed the following data within the [place] notes reviewed and incorporated, Labs reviewed , Old chart reviewed, Radiograph reviewed , Notes from prior ED visits and [place] Controlled Substance Database    Pertinent labs & imaging results that were available during my care of the patient were reviewed by me and considered in my medical decision making (see chart for details).    ____________________________________________   FINAL CLINICAL IMPRESSION(S) / ED DIAGNOSES  Final diagnoses:  Abdominal pain  Trichomonas vaginalis (TV) infection  PID (acute pelvic inflammatory disease)      NEW MEDICATIONS STARTED DURING THIS VISIT:  ED Discharge Orders         Ordered    metroNIDAZOLE (FLAGYL) 500 MG tablet  2 times daily     11/25/17 2000    doxycycline (VIBRAMYCIN) 100 MG capsule  2 times daily     11/25/17 2000    ondansetron (ZOFRAN ODT) 4 MG disintegrating tablet  Every 8 hours PRN     11/25/17 2000    traMADol (ULTRAM) 50 MG tablet   Every 6 hours PRN     11/25/17 2000           Note:  This document was prepared using Dragon voice recognition software and may include unintentional dictation errors.    Rudene Re, MD 11/25/17 2003

## 2017-12-07 ENCOUNTER — Encounter: Payer: Self-pay | Admitting: Emergency Medicine

## 2017-12-07 ENCOUNTER — Emergency Department
Admission: EM | Admit: 2017-12-07 | Discharge: 2017-12-07 | Disposition: A | Discharge status: Home / Self Care | Payer: Self-pay | Attending: Emergency Medicine | Admitting: Emergency Medicine

## 2017-12-07 ENCOUNTER — Other Ambulatory Visit: Payer: Self-pay

## 2017-12-07 DIAGNOSIS — F1911 Other psychoactive substance abuse, in remission: Secondary | ICD-10-CM | POA: Insufficient documentation

## 2017-12-07 DIAGNOSIS — M5431 Sciatica, right side: Secondary | ICD-10-CM

## 2017-12-07 DIAGNOSIS — M5441 Lumbago with sciatica, right side: Secondary | ICD-10-CM | POA: Insufficient documentation

## 2017-12-07 DIAGNOSIS — F17210 Nicotine dependence, cigarettes, uncomplicated: Secondary | ICD-10-CM | POA: Insufficient documentation

## 2017-12-07 DIAGNOSIS — E119 Type 2 diabetes mellitus without complications: Secondary | ICD-10-CM | POA: Insufficient documentation

## 2017-12-07 MED ORDER — CYCLOBENZAPRINE HCL 5 MG PO TABS: 5.0000 mg | ORAL_TABLET | Freq: Three times a day (TID) | ORAL | 0 refills | Status: DC | PRN

## 2017-12-07 MED ORDER — KETOROLAC TROMETHAMINE 10 MG PO TABS: 10.0000 mg | ORAL_TABLET | Freq: Three times a day (TID) | ORAL | 0 refills | Status: DC

## 2017-12-07 MED ORDER — METOCLOPRAMIDE HCL 10 MG PO TABS
10.0000 mg | ORAL_TABLET | Freq: Once | ORAL | Status: AC
  Administered 2017-12-07: 10 mg via ORAL
  Filled 2017-12-07: qty 1

## 2017-12-07 MED ORDER — KETOROLAC TROMETHAMINE 30 MG/ML IJ SOLN
30.0000 mg | Freq: Once | INTRAMUSCULAR | Status: AC
  Administered 2017-12-07: 30 mg via INTRAMUSCULAR
  Filled 2017-12-07: qty 1

## 2017-12-07 NOTE — Discharge Instructions (Signed)
Take the prescription meds as directed. Follow-up with one of the alcohol/drug treatment facilities listed.

## 2017-12-07 NOTE — ED Notes (Addendum)
This RN went to discharge pt. Pt was not in room. Attempted to contact pt via mobile with no success. EDP aware.

## 2017-12-07 NOTE — ED Notes (Signed)
See triage note  Presents with back pain  Unsure of injury but did a lot of moving with her mother this weekend

## 2017-12-07 NOTE — ED Triage Notes (Signed)
Pt presents with right leg and back pain since last night. She states that she had to move her mother into a facility this weekend and she thinks that she "overdid it." Pt is very tearful during triage. She also reports that she was supposed to go into treatment for alcohol and drug abuse but was told she could not go because she had stayed sober for 4 days. She would like a referral for help with sobriety.

## 2017-12-08 ENCOUNTER — Emergency Department: Admission: EM | Admit: 2017-12-08 | Discharge: 2017-12-08 | Discharge status: Against Medical Advice | Payer: Self-pay

## 2017-12-09 NOTE — ED Provider Notes (Signed)
[facility] Emergency Department Provider Note ____________________________________________  Time seen: 70  I have reviewed the triage vital signs and the nursing notes.  HISTORY  Chief Complaint  Back Pain  HPI Julie Lambert is a 52 y.o. female who presents herself to the ED for evaluation of acute flare of her chronic LBP and sciatica. She denies any recent injury, but does note she moved her elderly mother into assisted living facility last week. She recalls carrying large storage containers between the house and the facility. She denies any incontinence or weakness distally. She also has an unrelated request for resources for substance abuse detoxification. She admits to alcohol and cocaine abuse. She reports she last use 4-5 days prior. She was apparently declined entry to [facility], as she had been clean for more than 3 days.   Past Medical History:  Diagnosis Date  . Bipolar 2 disorder ([place])   . Diabetes mellitus without complication ([place])   . Diverticulitis   . Diverticulosis   . Uterine fibroid     There are no active problems to display for this patient.   Past Surgical History:  Procedure Laterality Date  . BACK SURGERY    . CHOLECYSTECTOMY    . LUMBAR SPINE SURGERY      Prior to Admission medications   Medication Sig Start Date End Date Taking? Authorizing Provider  albuterol (PROVENTIL HFA;VENTOLIN HFA) 108 (90 Base) MCG/ACT inhaler Inhale 2 puffs into the lungs every 6 (six) hours as needed for wheezing or shortness of breath. 05/28/17   Schuyler Amor, MD  ARIPiprazole (ABILIFY) 5 MG tablet Take 1 tablet (5 mg total) by mouth daily. Patient not taking: Reported on 09/20/2017 07/12/17   Hinda Kehr, MD  cyclobenzaprine (FLEXERIL) 5 MG tablet Take 1 tablet (5 mg total) by mouth 3 (three) times daily as needed. 12/07/17   Ghada Abbett, Dannielle Karvonen, PA-C  glipiZIDE (GLUCOTROL) 5 MG tablet Take 0.5 tablets (2.5 mg total) by mouth 2 (two) times  daily. 09/21/17 11/20/17  Harvest Dark, MD  ketorolac (TORADOL) 10 MG tablet Take 1 tablet (10 mg total) by mouth every 8 (eight) hours. 12/07/17   Opal Mckellips, Dannielle Karvonen, PA-C  metFORMIN (GLUCOPHAGE) 500 MG tablet Take 1 tablet (500 mg total) by mouth 2 (two) times daily with a meal. 07/12/17 07/12/18  Hinda Kehr, MD  ondansetron (ZOFRAN ODT) 4 MG disintegrating tablet Take 1 tablet (4 mg total) by mouth every 8 (eight) hours as needed for nausea or vomiting. 11/25/17   Rudene Re, MD  traMADol (ULTRAM) 50 MG tablet Take 1 tablet (50 mg total) by mouth every 6 (six) hours as needed. 11/25/17 11/25/18  Rudene Re, MD    Allergies Iodinated diagnostic agents  History reviewed. No pertinent family history.  Social History Social History   Tobacco Use  . Smoking status: Current Every Day Smoker    Packs/day: 0.20    Types: Cigarettes  . Smokeless tobacco: Never Used  Substance Use Topics  . Alcohol use: Yes    Comment: last use 12/03/2017  . Drug use: Not Currently    Review of Systems  Constitutional: Negative for fever. Eyes: Negative for visual changes. ENT: Negative for sore throat. Cardiovascular: Negative for chest pain. Respiratory: Negative for shortness of breath. Gastrointestinal: Negative for abdominal pain, vomiting and diarrhea. Genitourinary: Negative for dysuria. Musculoskeletal: Negative for back pain. Right sciatic as above Skin: Negative for rash. Neurological: Negative for headaches, focal weakness or numbness. ____________________________________________  PHYSICAL EXAM:  VITAL SIGNS: ED Triage Vitals [12/07/17 1741]  Enc Vitals Group     BP (!) 177/107     Pulse Rate (!) 102     Resp 18     Temp 98.4 F (36.9 C)     Temp Source Oral     SpO2 99 %     Weight 152 lb (68.9 kg)     Height 5\' 8"  (1.727 m)     Head Circumference      Peak Flow      Pain Score 10     Pain Loc      Pain Edu?      Excl. in [place]?      Constitutional: Alert and oriented. Well appearing and in no distress. Head: Normocephalic and atraumatic. Eyes: Conjunctivae are normal. Normal extraocular movements Cardiovascular: Normal rate, regular rhythm. Normal distal pulses. Respiratory: Normal respiratory effort. No wheezes/rales/rhonchi. Gastrointestinal: Soft and nontender. No distention. Musculoskeletal: Nontender with normal range of motion in all extremities.  Neurologic:  Normal gait without ataxia. Normal speech and language. No gross focal neurologic deficits are appreciated. Skin:  Skin is warm, dry and intact. No rash noted. Psychiatric: Mood and affect are normal. Patient exhibits appropriate insight and judgment. ____________________________________________  PROCEDURES  Procedures Ketorolac 30 mg IM Reglan 10 mg PO ____________________________________________  INITIAL IMPRESSION / ASSESSMENT AND PLAN / ED COURSE  Patient with ED evaluation of sciatica. Her exam is benign at this time. She is also requesting resource information for substance abuse. She was to be discharged with the requested information, but eloped after treatment.  ____________________________________________  FINAL CLINICAL IMPRESSION(S) / ED DIAGNOSES  Final diagnoses:  Sciatica of right side  H/O: substance abuse ([place])      Carmie End, Dannielle Karvonen, PA-C 12/09/17 9450    Arta Silence, MD 12/10/17 [phone]

## 2017-12-25 ENCOUNTER — Encounter: Payer: Self-pay | Admitting: Intensive Care

## 2017-12-25 ENCOUNTER — Other Ambulatory Visit: Payer: Self-pay

## 2017-12-25 ENCOUNTER — Emergency Department
Admission: EM | Admit: 2017-12-25 | Discharge: 2017-12-25 | Disposition: A | Discharge status: Other Acute Inpatient Hospital | Payer: Self-pay | Attending: Emergency Medicine | Admitting: Emergency Medicine

## 2017-12-25 ENCOUNTER — Inpatient Hospital Stay
Admission: AD | Admit: 2017-12-25 | Discharge: 2017-12-31 | DRG: 885 | Disposition: A | Discharge status: Home / Self Care | Payer: No Typology Code available for payment source | Source: Intra-hospital | Attending: Psychiatry | Admitting: Psychiatry

## 2017-12-25 DIAGNOSIS — E1142 Type 2 diabetes mellitus with diabetic polyneuropathy: Secondary | ICD-10-CM | POA: Diagnosis present

## 2017-12-25 DIAGNOSIS — Z79899 Other long term (current) drug therapy: Secondary | ICD-10-CM | POA: Diagnosis not present

## 2017-12-25 DIAGNOSIS — F1420 Cocaine dependence, uncomplicated: Secondary | ICD-10-CM

## 2017-12-25 DIAGNOSIS — N939 Abnormal uterine and vaginal bleeding, unspecified: Secondary | ICD-10-CM | POA: Diagnosis present

## 2017-12-25 DIAGNOSIS — B379 Candidiasis, unspecified: Secondary | ICD-10-CM | POA: Diagnosis present

## 2017-12-25 DIAGNOSIS — G4700 Insomnia, unspecified: Secondary | ICD-10-CM | POA: Diagnosis present

## 2017-12-25 DIAGNOSIS — Z590 Homelessness: Secondary | ICD-10-CM

## 2017-12-25 DIAGNOSIS — R45851 Suicidal ideations: Secondary | ICD-10-CM | POA: Insufficient documentation

## 2017-12-25 DIAGNOSIS — F10239 Alcohol dependence with withdrawal, unspecified: Secondary | ICD-10-CM | POA: Diagnosis present

## 2017-12-25 DIAGNOSIS — Z7984 Long term (current) use of oral hypoglycemic drugs: Secondary | ICD-10-CM

## 2017-12-25 DIAGNOSIS — F3181 Bipolar II disorder: Principal | ICD-10-CM | POA: Diagnosis present

## 2017-12-25 DIAGNOSIS — E119 Type 2 diabetes mellitus without complications: Secondary | ICD-10-CM

## 2017-12-25 DIAGNOSIS — E1165 Type 2 diabetes mellitus with hyperglycemia: Secondary | ICD-10-CM | POA: Diagnosis present

## 2017-12-25 DIAGNOSIS — F17210 Nicotine dependence, cigarettes, uncomplicated: Secondary | ICD-10-CM | POA: Insufficient documentation

## 2017-12-25 DIAGNOSIS — E785 Hyperlipidemia, unspecified: Secondary | ICD-10-CM | POA: Diagnosis present

## 2017-12-25 DIAGNOSIS — Z833 Family history of diabetes mellitus: Secondary | ICD-10-CM | POA: Diagnosis not present

## 2017-12-25 DIAGNOSIS — F319 Bipolar disorder, unspecified: Secondary | ICD-10-CM | POA: Insufficient documentation

## 2017-12-25 DIAGNOSIS — F1020 Alcohol dependence, uncomplicated: Secondary | ICD-10-CM | POA: Diagnosis present

## 2017-12-25 DIAGNOSIS — Z23 Encounter for immunization: Secondary | ICD-10-CM

## 2017-12-25 DIAGNOSIS — F1410 Cocaine abuse, uncomplicated: Secondary | ICD-10-CM | POA: Diagnosis present

## 2017-12-25 LAB — GLUCOSE, CAPILLARY
GLUCOSE-CAPILLARY: 255 mg/dL — AB (ref 70–99)
GLUCOSE-CAPILLARY: 471 mg/dL — AB (ref 70–99)
Glucose-Capillary: 296 mg/dL — ABNORMAL HIGH (ref 70–99)

## 2017-12-25 LAB — COMPREHENSIVE METABOLIC PANEL
ALT: 18 U/L (ref 0–44)
AST: 15 U/L (ref 15–41)
Albumin: 4.2 g/dL (ref 3.5–5.0)
Alkaline Phosphatase: 101 U/L (ref 38–126)
Anion gap: 14 (ref 5–15)
BILIRUBIN TOTAL: 1 mg/dL (ref 0.3–1.2)
BUN: 8 mg/dL (ref 6–20)
CO2: 20 mmol/L — ABNORMAL LOW (ref 22–32)
CREATININE: 0.41 mg/dL — AB (ref 0.44–1.00)
Calcium: 9.3 mg/dL (ref 8.9–10.3)
Chloride: 100 mmol/L (ref 98–111)
Glucose, Bld: 258 mg/dL — ABNORMAL HIGH (ref 70–99)
POTASSIUM: 3.8 mmol/L (ref 3.5–5.1)
Sodium: 134 mmol/L — ABNORMAL LOW (ref 135–145)
TOTAL PROTEIN: 7.5 g/dL (ref 6.5–8.1)

## 2017-12-25 LAB — URINE DRUG SCREEN, QUALITATIVE (ARMC ONLY)
Amphetamines, Ur Screen: NOT DETECTED
BARBITURATES, UR SCREEN: NOT DETECTED
Benzodiazepine, Ur Scrn: NOT DETECTED
CANNABINOID 50 NG, UR ~~LOC~~: NOT DETECTED
COCAINE METABOLITE, UR ~~LOC~~: POSITIVE — AB
MDMA (Ecstasy)Ur Screen: NOT DETECTED
METHADONE SCREEN, URINE: NOT DETECTED
Opiate, Ur Screen: NOT DETECTED
Phencyclidine (PCP) Ur S: NOT DETECTED
TRICYCLIC, UR SCREEN: NOT DETECTED

## 2017-12-25 LAB — CBC
HCT: 43.2 % (ref 36.0–46.0)
HEMOGLOBIN: 15.2 g/dL — AB (ref 12.0–15.0)
MCH: 31.3 pg (ref 26.0–34.0)
MCHC: 35.2 g/dL (ref 30.0–36.0)
MCV: 89.1 fL (ref 80.0–100.0)
PLATELETS: 278 10*3/uL (ref 150–400)
RBC: 4.85 MIL/uL (ref 3.87–5.11)
RDW: 12.1 % (ref 11.5–15.5)
WBC: 15.5 10*3/uL — AB (ref 4.0–10.5)
nRBC: 0 % (ref 0.0–0.2)

## 2017-12-25 LAB — ACETAMINOPHEN LEVEL

## 2017-12-25 LAB — SALICYLATE LEVEL

## 2017-12-25 LAB — ETHANOL

## 2017-12-25 MED ORDER — METFORMIN HCL 500 MG PO TABS
500.0000 mg | ORAL_TABLET | Freq: Two times a day (BID) | ORAL | Status: DC
  Administered 2017-12-25: 500 mg via ORAL
  Filled 2017-12-25: qty 1

## 2017-12-25 MED ORDER — FOLIC ACID 1 MG PO TABS
1.0000 mg | ORAL_TABLET | Freq: Every day | ORAL | Status: DC
  Administered 2017-12-26 – 2017-12-31 (×6): 1 mg via ORAL
  Filled 2017-12-25 (×6): qty 1

## 2017-12-25 MED ORDER — TRAZODONE HCL 100 MG PO TABS
100.0000 mg | ORAL_TABLET | Freq: Every day | ORAL | Status: DC
  Administered 2017-12-26 – 2017-12-27 (×2): 100 mg via ORAL
  Filled 2017-12-25 (×2): qty 1

## 2017-12-25 MED ORDER — LORAZEPAM 1 MG PO TABS
1.0000 mg | ORAL_TABLET | Freq: Four times a day (QID) | ORAL | Status: DC | PRN
  Administered 2017-12-25 – 2017-12-26 (×3): 1 mg via ORAL
  Filled 2017-12-25 (×3): qty 1

## 2017-12-25 MED ORDER — GLIPIZIDE 5 MG PO TABS
2.5000 mg | ORAL_TABLET | Freq: Two times a day (BID) | ORAL | Status: DC
  Administered 2017-12-26 – 2017-12-31 (×11): 2.5 mg via ORAL
  Filled 2017-12-25 (×12): qty 0.5

## 2017-12-25 MED ORDER — VITAMIN B-1 100 MG PO TABS
100.0000 mg | ORAL_TABLET | Freq: Every day | ORAL | Status: DC
  Administered 2017-12-26 – 2017-12-31 (×6): 100 mg via ORAL
  Filled 2017-12-25 (×6): qty 1

## 2017-12-25 MED ORDER — METFORMIN HCL 500 MG PO TABS
500.0000 mg | ORAL_TABLET | Freq: Two times a day (BID) | ORAL | Status: DC
  Administered 2017-12-26 – 2017-12-28 (×5): 500 mg via ORAL
  Filled 2017-12-25 (×6): qty 1

## 2017-12-25 MED ORDER — INSULIN ASPART 100 UNIT/ML ~~LOC~~ SOLN
9.0000 [IU] | Freq: Once | SUBCUTANEOUS | Status: AC
  Administered 2017-12-25: 9 [IU] via SUBCUTANEOUS

## 2017-12-25 MED ORDER — THIAMINE HCL 100 MG/ML IJ SOLN: 100.0000 mg | Freq: Every day | INTRAMUSCULAR | Status: DC

## 2017-12-25 MED ORDER — INSULIN ASPART 100 UNIT/ML ~~LOC~~ SOLN
0.0000 [IU] | Freq: Three times a day (TID) | SUBCUTANEOUS | Status: DC
  Administered 2017-12-26: 5 [IU] via SUBCUTANEOUS
  Administered 2017-12-26: 9 [IU] via SUBCUTANEOUS
  Filled 2017-12-25: qty 1

## 2017-12-25 MED ORDER — ALBUTEROL SULFATE (2.5 MG/3ML) 0.083% IN NEBU: 3.0000 mL | INHALATION_SOLUTION | Freq: Four times a day (QID) | RESPIRATORY_TRACT | Status: DC | PRN

## 2017-12-25 MED ORDER — LORAZEPAM 1 MG PO TABS
1.0000 mg | ORAL_TABLET | Freq: Four times a day (QID) | ORAL | Status: DC | PRN
  Administered 2017-12-25: 1 mg via ORAL
  Filled 2017-12-25: qty 1

## 2017-12-25 MED ORDER — FOLIC ACID 1 MG PO TABS
1.0000 mg | ORAL_TABLET | Freq: Every day | ORAL | Status: DC
  Administered 2017-12-25: 1 mg via ORAL
  Filled 2017-12-25: qty 1

## 2017-12-25 MED ORDER — HYDROXYZINE HCL 25 MG PO TABS
25.0000 mg | ORAL_TABLET | Freq: Three times a day (TID) | ORAL | Status: DC | PRN
  Administered 2017-12-25 – 2017-12-28 (×5): 25 mg via ORAL
  Filled 2017-12-25 (×5): qty 1

## 2017-12-25 MED ORDER — LORAZEPAM 2 MG/ML IJ SOLN: 1.0000 mg | Freq: Four times a day (QID) | INTRAMUSCULAR | Status: DC | PRN

## 2017-12-25 MED ORDER — ALBUTEROL SULFATE HFA 108 (90 BASE) MCG/ACT IN AERS
2.0000 | INHALATION_SPRAY | Freq: Four times a day (QID) | RESPIRATORY_TRACT | Status: DC | PRN
  Filled 2017-12-25: qty 6.7

## 2017-12-25 MED ORDER — ALUM & MAG HYDROXIDE-SIMETH 200-200-20 MG/5ML PO SUSP: 30.0000 mL | ORAL | Status: DC | PRN

## 2017-12-25 MED ORDER — GLIPIZIDE 5 MG PO TABS
2.5000 mg | ORAL_TABLET | Freq: Once | ORAL | Status: AC
  Administered 2017-12-25: 2.5 mg via ORAL
  Filled 2017-12-25: qty 0.5

## 2017-12-25 MED ORDER — ADULT MULTIVITAMIN W/MINERALS CH
1.0000 | ORAL_TABLET | Freq: Every day | ORAL | Status: DC
  Administered 2017-12-26 – 2017-12-31 (×6): 1 via ORAL
  Filled 2017-12-25 (×6): qty 1

## 2017-12-25 MED ORDER — ADULT MULTIVITAMIN W/MINERALS CH
1.0000 | ORAL_TABLET | Freq: Every day | ORAL | Status: DC
  Administered 2017-12-25: 1 via ORAL
  Filled 2017-12-25: qty 1

## 2017-12-25 MED ORDER — VITAMIN B-1 100 MG PO TABS
100.0000 mg | ORAL_TABLET | Freq: Every day | ORAL | Status: DC
  Administered 2017-12-25: 100 mg via ORAL
  Filled 2017-12-25: qty 1

## 2017-12-25 MED ORDER — INSULIN ASPART 100 UNIT/ML ~~LOC~~ SOLN
0.0000 [IU] | Freq: Three times a day (TID) | SUBCUTANEOUS | Status: DC
  Administered 2017-12-25: 5 [IU] via SUBCUTANEOUS
  Filled 2017-12-25: qty 1

## 2017-12-25 MED ORDER — GLIPIZIDE 5 MG PO TABS
2.5000 mg | ORAL_TABLET | Freq: Two times a day (BID) | ORAL | Status: DC
  Administered 2017-12-25: 2.5 mg via ORAL
  Filled 2017-12-25 (×2): qty 0.5

## 2017-12-25 MED ORDER — WHITE PETROLATUM EX OINT
TOPICAL_OINTMENT | CUTANEOUS | Status: AC
  Administered 2017-12-26: 06:00:00
  Filled 2017-12-25: qty 5

## 2017-12-25 MED ORDER — MAGNESIUM HYDROXIDE 400 MG/5ML PO SUSP
30.0000 mL | Freq: Every day | ORAL | Status: DC | PRN
  Administered 2017-12-30: 30 mL via ORAL
  Filled 2017-12-25: qty 30

## 2017-12-25 MED ORDER — ACETAMINOPHEN 325 MG PO TABS
650.0000 mg | ORAL_TABLET | Freq: Four times a day (QID) | ORAL | Status: DC | PRN
  Administered 2017-12-25 – 2017-12-28 (×5): 650 mg via ORAL
  Filled 2017-12-25 (×5): qty 2

## 2017-12-25 MED ORDER — TRAZODONE HCL 100 MG PO TABS: 100.0000 mg | ORAL_TABLET | Freq: Every day | ORAL | Status: DC

## 2017-12-25 MED ORDER — THIAMINE HCL 100 MG/ML IJ SOLN
100.0000 mg | Freq: Every day | INTRAMUSCULAR | Status: DC
  Filled 2017-12-25: qty 1

## 2017-12-25 NOTE — ED Notes (Signed)

## 2017-12-25 NOTE — ED Notes (Signed)
BEHAVIORAL HEALTH ROUNDING Patient sleeping: Yes.   Patient alert and oriented: eyes closed  Appears to be asleep Behavior appropriate: Yes.  ; If no, describe:  Nutrition and fluids offered: Yes  Toileting and hygiene offered: sleeping Sitter present: q 15 minute observations and security monitoring Law enforcement present: yes  ODS 

## 2017-12-25 NOTE — ED Provider Notes (Signed)
[facility] Emergency Department Provider Note ____________________________________________   I have reviewed the triage vital signs and the triage nursing note.  HISTORY  Chief Complaint Suicidal   Historian Patient  HPI Julie Lambert is a 52 y.o. female presents from home with a history of bipolar/depression, off medications for several months stating that she did not think that they would work and she was doing little bit better on her own, presenting with worsening stressors, crack cocaine abuse, alcohol abuse, feelings of depression as well as suicidal ideation without a plan.  No recent medical illnesses.   Patient is requesting admission to the hospital stating that she really needs help.    Past Medical History:  Diagnosis Date  . Bipolar 2 disorder ([place])   . Diabetes mellitus without complication ([place])   . Diverticulitis   . Diverticulosis   . Uterine fibroid     There are no active problems to display for this patient.   Past Surgical History:  Procedure Laterality Date  . BACK SURGERY    . CHOLECYSTECTOMY    . LUMBAR SPINE SURGERY      Prior to Admission medications   Medication Sig Start Date End Date Taking? Authorizing Provider  albuterol (PROVENTIL HFA;VENTOLIN HFA) 108 (90 Base) MCG/ACT inhaler Inhale 2 puffs into the lungs every 6 (six) hours as needed for wheezing or shortness of breath. 05/28/17   Schuyler Amor, MD  ARIPiprazole (ABILIFY) 5 MG tablet Take 1 tablet (5 mg total) by mouth daily. Patient not taking: Reported on 09/20/2017 07/12/17   Hinda Kehr, MD  cyclobenzaprine (FLEXERIL) 5 MG tablet Take 1 tablet (5 mg total) by mouth 3 (three) times daily as needed. 12/07/17   Menshew, Dannielle Karvonen, PA-C  glipiZIDE (GLUCOTROL) 5 MG tablet Take 0.5 tablets (2.5 mg total) by mouth 2 (two) times daily. 09/21/17 11/20/17  Harvest Dark, MD  ketorolac (TORADOL) 10 MG tablet Take 1 tablet (10 mg total) by mouth every 8  (eight) hours. 12/07/17   Menshew, Dannielle Karvonen, PA-C  metFORMIN (GLUCOPHAGE) 500 MG tablet Take 1 tablet (500 mg total) by mouth 2 (two) times daily with a meal. 07/12/17 07/12/18  Hinda Kehr, MD  ondansetron (ZOFRAN ODT) 4 MG disintegrating tablet Take 1 tablet (4 mg total) by mouth every 8 (eight) hours as needed for nausea or vomiting. 11/25/17   Rudene Re, MD  traMADol (ULTRAM) 50 MG tablet Take 1 tablet (50 mg total) by mouth every 6 (six) hours as needed. 11/25/17 11/25/18  Rudene Re, MD    Allergies  Allergen Reactions  . Iodinated Diagnostic Agents Itching    Patient stated she had itching on her back after IV injection    History reviewed. No pertinent family history.  Social History Social History   Tobacco Use  . Smoking status: Current Every Day Smoker    Packs/day: 0.20    Types: Cigarettes  . Smokeless tobacco: Never Used  Substance Use Topics  . Alcohol use: Yes    Comment: last use 12/03/2017  . Drug use: Yes    Types: Cocaine, Marijuana    Review of Systems  Constitutional: Negative for fever. Eyes: Negative for visual changes. ENT: Negative for sore throat. Cardiovascular: Negative for chest pain. Respiratory: Negative for shortness of breath. Gastrointestinal: Negative for abdominal pain, vomiting and diarrhea. Genitourinary: Negative for dysuria. Musculoskeletal: Negative for back pain. Skin: Negative for rash. Neurological: Negative for headache.  ____________________________________________   PHYSICAL EXAM:  VITAL SIGNS: ED  Triage Vitals [12/25/17 0952]  Enc Vitals Group     BP (!) 171/117     Pulse Rate (!) 118     Resp 20     Temp 98.8 F (37.1 C)     Temp Source Oral     SpO2 98 %     Weight 156 lb (70.8 kg)     Height 5\' 7"  (1.702 m)     Head Circumference      Peak Flow      Pain Score 9     Pain Loc      Pain Edu?      Excl. in [place]?      Constitutional: Alert and oriented.  Tearful and asking for  help. HEENT      Head: Normocephalic and atraumatic.      Eyes: Conjunctivae are normal. Pupils equal and round.       Ears:         Nose: No congestion/rhinnorhea.      Mouth/Throat: Mucous membranes are moist.      Neck: No stridor. Cardiovascular/Chest: Normal rate, regular rhythm.  No murmurs, rubs, or gallops. Respiratory: Normal respiratory effort without tachypnea nor retractions. Breath sounds are clear and equal bilaterally. No wheezes/rales/rhonchi. Gastrointestinal: Soft. No distention, no guarding, no rebound. Nontender.    Genitourinary/rectal:Deferred Musculoskeletal: Nontender with normal range of motion in all extremities. No joint effusions.  No lower extremity tenderness.  No edema. Neurologic:  Normal speech and language. No gross or focal neurologic deficits are appreciated. Skin:  Skin is warm, dry and intact. No rash noted. Psychiatric: Anxious and tearful.  Reports depression and vague suicidal ideation without a plan.  Patient exhibits appropriate insight and judgment.   ____________________________________________  LABS (pertinent positives/negatives) I, Lisa Roca, MD the attending physician have reviewed the labs noted below.  Labs Reviewed  COMPREHENSIVE METABOLIC PANEL - Abnormal; Notable for the following components:      Result Value   Sodium 134 (*)    CO2 20 (*)    Glucose, Bld 258 (*)    Creatinine, Ser 0.41 (*)    All other components within normal limits  CBC - Abnormal; Notable for the following components:   WBC 15.5 (*)    Hemoglobin 15.2 (*)    All other components within normal limits  URINE DRUG SCREEN, QUALITATIVE ([facility] ONLY) - Abnormal; Notable for the following components:   Cocaine Metabolite,Ur [place] POSITIVE (*)    All other components within normal limits  ACETAMINOPHEN LEVEL - Abnormal; Notable for the following components:   Acetaminophen (Tylenol), Serum <10 (*)    All other components within normal limits  GLUCOSE, CAPILLARY  - Abnormal; Notable for the following components:   Glucose-Capillary 255 (*)    All other components within normal limits  ETHANOL  SALICYLATE LEVEL  POC URINE PREG, ED    ____________________________________________    EKG I, Lisa Roca, MD, the attending physician have personally viewed and interpreted all ECGs.  None ____________________________________________  RADIOLOGY   None __________________________________________  PROCEDURES  Procedure(s) performed: None  Procedures  Critical Care performed: None   ____________________________________________  ED COURSE / ASSESSMENT AND PLAN  Pertinent labs & imaging results that were available during my care of the patient were reviewed by me and considered in my medical decision making (see chart for details).     Patient is voluntarily asking for help with worsening mood symptoms including depression and she is tearful here.  She is voluntary for  evaluation and I am going to go ahead and leave her like that for the time being.  No medical complaints.  Discussed with Dr. Weber Cooks, psychiatry face-to-face who is going to admit the patient here to [facility].    CONSULTATIONS:   TTS and Psych.   Patient / Family / Caregiver informed of clinical course, medical decision-making process, and agree with plan.    ___________________________________________   FINAL CLINICAL IMPRESSION(S) / ED DIAGNOSES   Final diagnoses:  Suicidal ideation      ___________________________________________         Note: This dictation was prepared with Dragon dictation. Any transcriptional errors that result from this process are unintentional    Lisa Roca, MD 12/25/17 1320

## 2017-12-25 NOTE — ED Triage Notes (Signed)
Patient states "I keep having thoughts of wanting to kill myself and I just cant do this no more" Patient has addiction to alcohol and cocaine. Patient reports being clean X4 days a couple weeks ago and called [facility] and they told her she did not meet criteria and ever since she has been using drugs and alcohol. Denies HI. Denies plan

## 2017-12-25 NOTE — BH Assessment (Signed)
Patient is to be admitted to [facility] by Dr. Weber Cooks.  Attending Physician will be Dr. Wonda Olds.   Patient has been assigned to room 307, by [place] Nurse [place].   Intake Paper Work has been signed and placed on patient chart.  ER staff is aware of the admission:  Sherry Ruffing, ER Secretary    Dr. Quentin Cornwall, ER MD   Amy T., Patient's Nurse   Gust Rung., Patient Access.

## 2017-12-25 NOTE — ED Notes (Signed)
Gave pt lunch tray,and pt requested a phone to make a phone call. Per Amy we gave her phone, and she is eating lunch.

## 2017-12-25 NOTE — ED Notes (Signed)
Pt requested cranberry juice. RN Amy says ok to have.

## 2017-12-25 NOTE — ED Notes (Signed)
Gave pt food tray and a juice. Let RN Amy aware if BS results.

## 2017-12-25 NOTE — BH Assessment (Signed)
Assessment Note  Julie Lambert is an 52 y.o. female who presents to the ER due to having thoughts of ending her life. She states she is feeling overwhelmed due to the current stressors. Stressors include losing her job, at risk of been homeless and had to place her mother into a nursing home. Patient further reports, due to the stressors, her drug use have increased and it's causing her depression to worsen. She admits to abusing alcohol and cocaine.  Patient reports of having feelings, of hopelessness, worthlessness, helplessness and wanting to die.  During the interview, the patient was cooperative, pleasant and polite. Several times, she became tearful to the point it was difficult for her to talk. Several times the patient stated, she's "tired of living like this" an afraid she's going to attempt ending her life. Patient has had one suicide attempt by trying to overdose. It resulted her been admitted.  Patient currently denies HI and AV/H.  Diagnosis: Bipolar  Past Medical History:  Past Medical History:  Diagnosis Date  . Bipolar 2 disorder ([place])   . Diabetes mellitus without complication ([place])   . Diverticulitis   . Diverticulosis   . Uterine fibroid     Past Surgical History:  Procedure Laterality Date  . BACK SURGERY    . CHOLECYSTECTOMY    . LUMBAR SPINE SURGERY      Family History: History reviewed. No pertinent family history.  Social History:  reports that she has been smoking cigarettes. She has been smoking about 0.20 packs per day. She has never used smokeless tobacco. She reports that she drinks alcohol. She reports that she has current or past drug history. Drugs: Cocaine and Marijuana.  Additional Social History:  Alcohol / Drug Use Pain Medications: See PTA Prescriptions: See PTA Over the Counter: See PTA History of alcohol / drug use?: Yes Negative Consequences of Use: Personal relationships, Financial Withdrawal Symptoms: (Reports of none) Substance  #1 Name of Substance 1: Cocaine 1 - Last Use / Amount: 12/25/2017 Substance #2 Name of Substance 2: Alcohol 2 - Last Use / Amount: 12/25/2017  CIWA: CIWA-Ar BP: (!) 171/117 Pulse Rate: (!) 118 COWS:    Allergies:  Allergies  Allergen Reactions  . Iodinated Diagnostic Agents Itching    Patient stated she had itching on her back after IV injection    Home Medications:  (Not in a hospital admission)  OB/GYN Status:  No LMP recorded. Patient is perimenopausal.  General Assessment Data Location of Assessment: [facility] ED TTS Assessment: In system Is this a Tele or Face-to-Face Assessment?: Face-to-Face Is this an Initial Assessment or a Re-assessment for this encounter?: Initial Assessment Language Other than English: No Living Arrangements: Other (Comment)(Private Home) What gender do you identify as?: Female Marital status: Single Pregnancy Status: No Living Arrangements: Other (Comment)(Family) Can pt return to current living arrangement?: Yes Admission Status: Voluntary Is patient capable of signing voluntary admission?: Yes Referral Source: Self/Family/Friend Insurance type: None  Medical Screening Exam ([place]) Medical Exam completed: Yes  Crisis Care Plan Living Arrangements: Other (Comment)(Family) Legal Guardian: Other:(Self) Name of Psychiatrist: Reports of none Name of Therapist: Reports of none  Education Status Is patient currently in school?: No Is the patient employed, unemployed or receiving disability?: Employed  Risk to self with the past 6 months Suicidal Ideation: Yes-Currently Present Has patient been a risk to self within the past 6 months prior to admission? : Yes Suicidal Intent: No Has patient had any suicidal intent within the past  6 months prior to admission? : No Is patient at risk for suicide?: Yes Suicidal Plan?: Yes-Currently Present Has patient had any suicidal plan within the past 6 months prior to admission? :  Yes Specify Current Suicidal Plan: Overdose on medications Access to Means: Yes Specify Access to Suicidal Means: Medications What has been your use of drugs/alcohol within the last 12 months?: Alcohol & Coccaine Previous Attempts/Gestures: Yes How many times?: 1 Other Self Harm Risks: Active Drug Use Triggers for Past Attempts: None known Intentional Self Injurious Behavior: None Family Suicide History: No Recent stressful life event(s): Other (Comment), Job Loss, Loss (Comment) Persecutory voices/beliefs?: Yes Depression: Yes Depression Symptoms: Insomnia, Tearfulness, Isolating, Fatigue, Guilt, Loss of interest in usual pleasures, Feeling worthless/self pity Substance abuse history and/or treatment for substance abuse?: Yes Suicide prevention information given to non-admitted patients: Not applicable  Risk to Others within the past 6 months Homicidal Ideation: No Does patient have any lifetime risk of violence toward others beyond the six months prior to admission? : No Thoughts of Harm to Others: No Current Homicidal Intent: No Current Homicidal Plan: No Access to Homicidal Means: No Identified Victim: Reports of none History of harm to others?: No Assessment of Violence: None Noted Violent Behavior Description: Reports of none Does patient have access to weapons?: No Criminal Charges Pending?: No Does patient have a court date: No Is patient on probation?: No  Psychosis Hallucinations: None noted Delusions: None noted  Mental Status Report Appearance/Hygiene: Unremarkable, In scrubs Eye Contact: Fair Motor Activity: Freedom of movement, Unremarkable Speech: Logical/coherent, Unremarkable Level of Consciousness: Alert Mood: Depressed, Anxious, Sad, Pleasant Affect: Appropriate to circumstance, Depressed, Sad Anxiety Level: Minimal Thought Processes: Coherent, Relevant Judgement: Unimpaired Orientation: Person, Place, Time, Situation, Appropriate for developmental  age Obsessive Compulsive Thoughts/Behaviors: Minimal  Cognitive Functioning Concentration: Normal Memory: Recent Intact, Remote Intact Is patient IDD: No Insight: Fair Impulse Control: Fair Appetite: Fair Have you had any weight changes? : No Change Sleep: Decreased Total Hours of Sleep: 4(Trouble falling and staying asleep) Vegetative Symptoms: None  ADLScreening [facility] Assessment Services) Patient's cognitive ability adequate to safely complete daily activities?: Yes Patient able to express need for assistance with ADLs?: Yes Independently performs ADLs?: Yes (appropriate for developmental age)  Prior Inpatient Therapy Prior Inpatient Therapy: Yes Prior Therapy Dates: [place] Prior Therapy Facilty/Provider(s): [facility] Treatment facility in [facility] Reason for Treatment: Depression & Substance Abuse  Prior Outpatient Therapy Prior Outpatient Therapy: No Does patient have an ACCT team?: No Does patient have Intensive In-House Services?  : No Does patient have [facility] services? : No Does patient have P4CC services?: No  ADL Screening (condition at time of admission) Patient's cognitive ability adequate to safely complete daily activities?: Yes Is the patient deaf or have difficulty hearing?: No Does the patient have difficulty seeing, even when wearing glasses/contacts?: No Does the patient have difficulty concentrating, remembering, or making decisions?: No Patient able to express need for assistance with ADLs?: Yes Does the patient have difficulty dressing or bathing?: No Independently performs ADLs?: Yes (appropriate for developmental age) Does the patient have difficulty walking or climbing stairs?: No Weakness of Legs: None Weakness of Arms/Hands: None  Home Assistive Devices/Equipment Home Assistive Devices/Equipment: None  Therapy Consults (therapy consults require a physician order) PT Evaluation Needed: No OT Evalulation Needed: No SLP Evaluation Needed:  No Abuse/Neglect Assessment (Assessment to be complete while patient is alone) Abuse/Neglect Assessment Can Be Completed: Yes Physical Abuse: Denies Verbal Abuse: Denies Sexual Abuse: Denies  Exploitation of patient/patient's resources: Denies Self-Neglect: Denies Values / Beliefs Cultural Requests During Hospitalization: None Spiritual Requests During Hospitalization: None Consults Spiritual Care Consult Needed: No Social Work Consult Needed: No Regulatory affairs officer (For Healthcare) Does Patient Have a Medical Advance Directive?: No Would patient like information on creating a medical advance directive?: No - Patient declined       Child/Adolescent Assessment Running Away Risk: Denies(Patient is an adult)  Disposition:  Disposition Initial Assessment Completed for this Encounter: Yes  On Site Evaluation by:   Reviewed with Physician:    Gunnar Fusi MS, LCAS, LPC, [place], CCSI Therapeutic Triage Specialist 12/25/2017 1:55 PM

## 2017-12-25 NOTE — ED Notes (Signed)
BEHAVIORAL HEALTH ROUNDING Patient sleeping: No. Patient alert and oriented: yes Behavior appropriate: Yes.  ; If no, describe:  Nutrition and fluids offered: yes Toileting and hygiene offered: Yes  Sitter present: q15 minute observations and security  monitoring Law enforcement present: Yes  ODS  

## 2017-12-25 NOTE — Consult Note (Signed)
[facility] Face-to-Face Psychiatry Consult   Reason for Consult: Consult for this 52 year old woman who came voluntarily to the emergency room for depression Referring Physician: Reita Cliche Patient Identification: TIENA MANANSALA MRN:  [id number] Principal Diagnosis: Bipolar 2 disorder [facility]) Diagnosis:   Patient Active Problem List   Diagnosis Date Noted  . Bipolar 2 disorder ([place]) [F31.81] 12/25/2017  . Diabetes mellitus without complication ([place]) [K74.2] 12/25/2017  . Alcohol abuse [F10.10] 12/25/2017  . Cocaine abuse ([place]) [F14.10] 12/25/2017    Total Time spent with patient: 1 hour  Subjective:   BLASA RAISCH is a 52 y.o. female patient admitted with "I feel like I have no purpose".  HPI: Patient seen chart reviewed.  52 year old woman with a history of mood disorder and substance abuse.  Came to the hospital reporting severe depression.  Mood feels down negative and sad all the time.  Feels like she has no purpose in life.  Feels like she has no support.  She is having active suicidal thoughts.  Patient is not currently getting any outpatient psychiatric treatment.  She thought about killing herself by driving a car into a accident.  Patient has been using crack cocaine about every other day and has been drinking every day with her last drink this morning.  Denies any auditory or visual hallucinations.  Denies any homicidal ideation.  Social history: Patient is living with her aunt but it sounds like it is a tense situation.  The patient is not working.  She is estranged from her only son and does not sound like she feels she has much support.  Medical history: Diabetes usually medication managed.  She has not been taking her medicine recently.  Substance abuse history: Long-standing problems with cocaine and alcohol.  She says she had extended sobriety back in the 80s and has not had any significant sobriety probably since that 90s.  Has tried to stay sober a few times recently but never  been able to do it more than a couple months.  She has had rehab treatments in the past.  No history of delirium tremens or seizures.  Past Psychiatric History: Patient has a long history of mood disorder and substance abuse problems.  Has been on multiple medicines in the past.  The most recent one was Abilify which she did not find helpful but she acknowledges that she was still using while she was taking it.  She recalls trazodone and possibly Tegretol being helpful in the past.  Does have an history of suicide attempts and suicidal threats in the past.  Violence only in the context of an intimate relationship.  Risk to Self:   Risk to Others:   Prior Inpatient Therapy:   Prior Outpatient Therapy:    Past Medical History:  Past Medical History:  Diagnosis Date  . Bipolar 2 disorder ([place])   . Diabetes mellitus without complication ([place])   . Diverticulitis   . Diverticulosis   . Uterine fibroid     Past Surgical History:  Procedure Laterality Date  . BACK SURGERY    . CHOLECYSTECTOMY    . LUMBAR SPINE SURGERY     Family History: History reviewed. No pertinent family history. Family Psychiatric  History: Father with a drinking problem.  One cousin with some kind of unspecified mental health problem. Social History   Substance and Sexual Activity  Alcohol Use Yes   Comment: last use 12/03/2017     Social History   Substance and Sexual Activity  Drug Use  Yes  . Types: Cocaine, Marijuana    Social History   Socioeconomic History  . Marital status: Divorced    Spouse name: Not on file  . Number of children: Not on file  . Years of education: Not on file  . Highest education level: Not on file  Occupational History  . Not on file  Social Needs  . Financial resource strain: Not on file  . Food insecurity:    Worry: Not on file    Inability: Not on file  . Transportation needs:    Medical: Not on file    Non-medical: Not on file  Tobacco Use  . Smoking status:  Current Every Day Smoker    Packs/day: 0.20    Types: Cigarettes  . Smokeless tobacco: Never Used  Substance and Sexual Activity  . Alcohol use: Yes    Comment: last use 12/03/2017  . Drug use: Yes    Types: Cocaine, Marijuana  . Sexual activity: Yes  Lifestyle  . Physical activity:    Days per week: Not on file    Minutes per session: Not on file  . Stress: Not on file  Relationships  . Social connections:    Talks on phone: Not on file    Gets together: Not on file    Attends religious service: Not on file    Active member of club or organization: Not on file    Attends meetings of clubs or organizations: Not on file    Relationship status: Not on file  Other Topics Concern  . Not on file  Social History Narrative  . Not on file   Additional Social History:    Allergies:   Allergies  Allergen Reactions  . Iodinated Diagnostic Agents Itching    Patient stated she had itching on her back after IV injection    Labs:  Results for orders placed or performed during the hospital encounter of 12/25/17 (from the past 48 hour(s))  Comprehensive metabolic panel     Status: Abnormal   Collection Time: 12/25/17  9:56 AM  Result Value Ref Range   Sodium 134 (L) 135 - 145 mmol/L   Potassium 3.8 3.5 - 5.1 mmol/L   Chloride 100 98 - 111 mmol/L   CO2 20 (L) 22 - 32 mmol/L   Glucose, Bld 258 (H) 70 - 99 mg/dL   BUN 8 6 - 20 mg/dL   Creatinine, Ser 0.41 (L) 0.44 - 1.00 mg/dL   Calcium 9.3 8.9 - 10.3 mg/dL   Total Protein 7.5 6.5 - 8.1 g/dL   Albumin 4.2 3.5 - 5.0 g/dL   AST 15 15 - 41 U/L   ALT 18 0 - 44 U/L   Alkaline Phosphatase 101 38 - 126 U/L   Total Bilirubin 1.0 0.3 - 1.2 mg/dL   GFR calc non Af Amer >60 >60 mL/min   GFR calc Af Amer >60 >60 mL/min    Comment: (NOTE) The eGFR has been calculated using the CKD EPI equation. This calculation has not been validated in all clinical situations. eGFR's persistently <60 mL/min signify possible Chronic Kidney Disease.     Anion gap 14 5 - 15    Comment: Performed at [facility], [address] [place], [place] [address]  Ethanol     Status: None   Collection Time: 12/25/17  9:56 AM  Result Value Ref Range   Alcohol, Ethyl (B) <10 <10 mg/dL    Comment: (NOTE) Lowest detectable limit for serum alcohol  is 10 mg/dL. For medical purposes only. Performed at [facility], [address] [place], [place] [address]   cbc     Status: Abnormal   Collection Time: 12/25/17  9:56 AM  Result Value Ref Range   WBC 15.5 (H) 4.0 - 10.5 K/uL   RBC 4.85 3.87 - 5.11 MIL/uL   Hemoglobin 15.2 (H) 12.0 - 15.0 g/dL   HCT 43.2 36.0 - 46.0 %   MCV 89.1 80.0 - 100.0 fL   MCH 31.3 26.0 - 34.0 pg   MCHC 35.2 30.0 - 36.0 g/dL   RDW 12.1 11.5 - 15.5 %   Platelets 278 150 - 400 K/uL   nRBC 0.0 0.0 - 0.2 %    Comment: Performed at [facility], [address] [place], [place] [address]  Urine Drug Screen, Qualitative     Status: Abnormal   Collection Time: 12/25/17  9:56 AM  Result Value Ref Range   Tricyclic, Ur Screen NONE DETECTED NONE DETECTED   Amphetamines, Ur Screen NONE DETECTED NONE DETECTED   MDMA (Ecstasy)Ur Screen NONE DETECTED NONE DETECTED   Cocaine Metabolite,Ur [place] POSITIVE (A) NONE DETECTED   Opiate, Ur Screen NONE DETECTED NONE DETECTED   Phencyclidine (PCP) Ur S NONE DETECTED NONE DETECTED   Cannabinoid 50 Ng, Ur [place] NONE DETECTED NONE DETECTED   Barbiturates, Ur Screen NONE DETECTED NONE DETECTED   Benzodiazepine, Ur Scrn NONE DETECTED NONE DETECTED   Methadone Scn, Ur NONE DETECTED NONE DETECTED    Comment: (NOTE) Tricyclics + metabolites, urine    Cutoff 1000 ng/mL Amphetamines + metabolites, urine  Cutoff 1000 ng/mL MDMA (Ecstasy), urine              Cutoff 500 ng/mL Cocaine Metabolite, urine          Cutoff 300 ng/mL Opiate + metabolites, urine        Cutoff 300 ng/mL Phencyclidine (PCP), urine         Cutoff 25 ng/mL Cannabinoid, urine                 Cutoff 50  ng/mL Barbiturates + metabolites, urine  Cutoff 200 ng/mL Benzodiazepine, urine              Cutoff 200 ng/mL Methadone, urine                   Cutoff 300 ng/mL The urine drug screen provides only a preliminary, unconfirmed analytical test result and should not be used for non-medical purposes. Clinical consideration and professional judgment should be applied to any positive drug screen result due to possible interfering substances. A more specific alternate chemical method must be used in order to obtain a confirmed analytical result. Gas chromatography / mass spectrometry (GC/MS) is the preferred confirmat ory method. Performed at [facility], [address] [place], [place] [address]   Acetaminophen level     Status: Abnormal   Collection Time: 12/25/17  9:56 AM  Result Value Ref Range   Acetaminophen (Tylenol), Serum <10 (L) 10 - 30 ug/mL    Comment: (NOTE) Therapeutic concentrations vary significantly. A range of 10-30 ug/mL  may be an effective concentration for many patients. However, some  are best treated at concentrations outside of this range. Acetaminophen concentrations >150 ug/mL at 4 hours after ingestion  and >50 ug/mL at 12 hours after ingestion are often associated with  toxic reactions. Performed at [facility], [address] [place], [place] [address]  Salicylate level     Status: None   Collection Time: 12/25/17  9:56 AM  Result Value Ref Range   Salicylate Lvl <6.9 2.8 - 30.0 mg/dL    Comment: Performed at [facility], [address] [place], [place] [address]  Glucose, capillary     Status: Abnormal   Collection Time: 12/25/17  9:58 AM  Result Value Ref Range   Glucose-Capillary 255 (H) 70 - 99 mg/dL    Current Facility-Administered Medications  Medication Dose Route Frequency Provider Last Rate Last Dose  . albuterol (PROVENTIL HFA;VENTOLIN HFA) 108 (90 Base) MCG/ACT inhaler 2 puff  2 puff Inhalation Q6H PRN  Ashmi Blas T, MD      . folic acid (FOLVITE) tablet 1 mg  1 mg Oral Daily Andree Golphin T, MD      . glipiZIDE (GLUCOTROL) tablet 2.5 mg  2.5 mg Oral BID AC Tattianna Schnarr T, MD      . insulin aspart (novoLOG) injection 0-9 Units  0-9 Units Subcutaneous TID WC Hadar Elgersma T, MD      . LORazepam (ATIVAN) tablet 1 mg  1 mg Oral Q6H PRN Lynnleigh Soden, [name], MD       Or  . LORazepam (ATIVAN) injection 1 mg  1 mg Intravenous Q6H PRN Conchetta Lamia T, MD      . metFORMIN (GLUCOPHAGE) tablet 500 mg  500 mg Oral BID WC Arisa Congleton T, MD      . multivitamin with minerals tablet 1 tablet  1 tablet Oral Daily Virna Livengood T, MD      . thiamine (VITAMIN B-1) tablet 100 mg  100 mg Oral Daily Sophira Rumler T, MD       Or  . thiamine (B-1) injection 100 mg  100 mg Intravenous Daily Zayvien Canning T, MD      . traZODone (DESYREL) tablet 100 mg  100 mg Oral QHS Mikayah Joy, [name], MD       Current Outpatient Medications  Medication Sig Dispense Refill  . albuterol (PROVENTIL HFA;VENTOLIN HFA) 108 (90 Base) MCG/ACT inhaler Inhale 2 puffs into the lungs every 6 (six) hours as needed for wheezing or shortness of breath. 1 Inhaler 2  . ARIPiprazole (ABILIFY) 5 MG tablet Take 1 tablet (5 mg total) by mouth daily. (Patient not taking: Reported on 09/20/2017) 30 tablet 2  . cyclobenzaprine (FLEXERIL) 5 MG tablet Take 1 tablet (5 mg total) by mouth 3 (three) times daily as needed. 15 tablet 0  . glipiZIDE (GLUCOTROL) 5 MG tablet Take 0.5 tablets (2.5 mg total) by mouth 2 (two) times daily. 30 tablet 1  . ketorolac (TORADOL) 10 MG tablet Take 1 tablet (10 mg total) by mouth every 8 (eight) hours. 15 tablet 0  . metFORMIN (GLUCOPHAGE) 500 MG tablet Take 1 tablet (500 mg total) by mouth 2 (two) times daily with a meal. 60 tablet 11  . ondansetron (ZOFRAN ODT) 4 MG disintegrating tablet Take 1 tablet (4 mg total) by mouth every 8 (eight) hours as needed for nausea or vomiting. 20 tablet 0  . traMADol (ULTRAM) 50 MG tablet  Take 1 tablet (50 mg total) by mouth every 6 (six) hours as needed. 8 tablet 0    Musculoskeletal: Strength & Muscle Tone: within normal limits Gait & Station: normal Patient leans: N/A  Psychiatric Specialty Exam: Physical Exam  Nursing note and vitals reviewed. Constitutional: She appears well-developed and well-nourished.  HENT:  Head: Normocephalic and atraumatic.  Eyes: Pupils are equal, round,  and reactive to light. Conjunctivae are normal.  Neck: Normal range of motion.  Cardiovascular: Regular rhythm and normal heart sounds.  Respiratory: Effort normal. No respiratory distress.  GI: Soft.  Musculoskeletal: Normal range of motion.  Neurological: She is alert.  Skin: Skin is warm and dry.  Psychiatric: Her speech is tangential. She is agitated. She is not aggressive. Thought content is paranoid. She expresses impulsivity. She exhibits a depressed mood. She expresses suicidal ideation. She expresses suicidal plans. She exhibits abnormal recent memory.    Review of Systems  Constitutional: Negative.   HENT: Negative.   Eyes: Negative.   Respiratory: Negative.   Cardiovascular: Negative.   Gastrointestinal: Negative.   Musculoskeletal: Negative.   Skin: Negative.   Neurological: Negative.   Psychiatric/Behavioral: Positive for depression, memory loss, substance abuse and suicidal ideas. Negative for hallucinations. The patient is nervous/anxious and has insomnia.     Blood pressure (!) 171/117, pulse (!) 118, temperature 98.8 F (37.1 C), temperature source Oral, resp. rate 20, height '5\' 7"'  (1.702 m), weight 70.8 kg, SpO2 98 %.Body mass index is 24.43 kg/m.  General Appearance: Disheveled  Eye Contact:  Minimal  Speech:  Slow  Volume:  Decreased  Mood:  Depressed  Affect:  Congruent  Thought Process:  Coherent  Orientation:  Full (Time, Place, and Person)  Thought Content:  Logical and Rumination  Suicidal Thoughts:  Yes.  with intent/plan  Homicidal Thoughts:  No   Memory:  Immediate;   Good Recent;   Fair Remote;   Fair  Judgement:  Fair  Insight:  Fair  Psychomotor Activity:  Decreased  Concentration:  Concentration: Fair  Recall:  [employer] of Knowledge:  Fair  Language:  Fair  Akathisia:  No  Handed:  Right  AIMS (if indicated):     Assets:  Desire for Improvement Housing Physical Health Social Support  ADL's:  Intact  Cognition:  WNL  Sleep:        Treatment Plan Summary: Daily contact with patient to assess and evaluate symptoms and progress in treatment, Medication management and Plan Patient with a history of bipolar disorder type II with multiple depression symptoms including suicidal ideation.  Also has been abusing alcohol and is at risk of significant withdrawal.  Patient also has diabetes which is currently under poor control.  Admit to psychiatric unit.  Restart trazodone.  We will have detox medicines for alcohol withdrawal and try to manage her diabetes with oral medicines and sliding scale.  I am holding off starting anything more specific psychiatrically until she is detoxed.  15-minute checks in place.  Full set of labs will be done.  EKG was already done in the emergency room.  Case reviewed with TTS and emergency room doctor.  Disposition: Recommend psychiatric Inpatient admission when medically cleared. Supportive therapy provided about ongoing stressors.  Alethia Berthold, MD 12/25/2017 1:29 PM

## 2017-12-25 NOTE — ED Notes (Signed)
FIRST NURSE NOTE:  Pt arrived tearful stating "I need some help" Pt declined to further elaborate need at registration desk.

## 2017-12-26 ENCOUNTER — Encounter: Payer: Self-pay | Admitting: Specialist

## 2017-12-26 DIAGNOSIS — F3181 Bipolar II disorder: Principal | ICD-10-CM

## 2017-12-26 DIAGNOSIS — F1020 Alcohol dependence, uncomplicated: Secondary | ICD-10-CM

## 2017-12-26 LAB — GLUCOSE, CAPILLARY
GLUCOSE-CAPILLARY: 263 mg/dL — AB (ref 70–99)
GLUCOSE-CAPILLARY: 259 mg/dL — AB (ref 70–99)
Glucose-Capillary: 353 mg/dL — ABNORMAL HIGH (ref 70–99)
Glucose-Capillary: 207 mg/dL — ABNORMAL HIGH (ref 70–99)

## 2017-12-26 LAB — HEMOGLOBIN A1C
Hgb A1c MFr Bld: 12.5 % — ABNORMAL HIGH (ref 4.8–5.6)
Mean Plasma Glucose: 312.05 mg/dL

## 2017-12-26 LAB — LIPID PANEL
CHOL/HDL RATIO: 3.3 ratio
CHOLESTEROL: 215 mg/dL — AB (ref 0–200)
HDL: 65 mg/dL (ref >40)
LDL Cholesterol: 114 mg/dL — ABNORMAL HIGH (ref 0–99)
Triglycerides: 179 mg/dL — ABNORMAL HIGH (ref <150)
VLDL: 36 mg/dL (ref 0–40)

## 2017-12-26 LAB — TSH: TSH: 1.335 u[IU]/mL (ref 0.350–4.500)

## 2017-12-26 MED ORDER — PNEUMOCOCCAL VAC POLYVALENT 25 MCG/0.5ML IJ INJ
0.5000 mL | INJECTION | INTRAMUSCULAR | Status: DC
  Filled 2017-12-26: qty 0.5

## 2017-12-26 MED ORDER — NICOTINE 21 MG/24HR TD PT24
21.0000 mg | MEDICATED_PATCH | Freq: Every day | TRANSDERMAL | Status: DC
  Administered 2017-12-26 – 2017-12-30 (×5): 21 mg via TRANSDERMAL
  Filled 2017-12-26 (×5): qty 1

## 2017-12-26 MED ORDER — INFLUENZA VAC SPLIT QUAD 0.5 ML IM SUSY
0.5000 mL | PREFILLED_SYRINGE | INTRAMUSCULAR | Status: AC
  Administered 2017-12-27: 0.5 mL via INTRAMUSCULAR
  Filled 2017-12-26: qty 0.5

## 2017-12-26 MED ORDER — CHLORDIAZEPOXIDE HCL 25 MG PO CAPS
25.0000 mg | ORAL_CAPSULE | Freq: Four times a day (QID) | ORAL | Status: DC | PRN
  Administered 2017-12-26: 25 mg via ORAL
  Filled 2017-12-26: qty 1

## 2017-12-26 MED ORDER — INSULIN ASPART 100 UNIT/ML ~~LOC~~ SOLN
0.0000 [IU] | Freq: Every day | SUBCUTANEOUS | Status: DC
  Administered 2017-12-26: 2 [IU] via SUBCUTANEOUS
  Administered 2017-12-27: 3 [IU] via SUBCUTANEOUS
  Filled 2017-12-26: qty 1

## 2017-12-26 MED ORDER — INSULIN GLARGINE 100 UNIT/ML ~~LOC~~ SOLN
10.0000 [IU] | Freq: Every day | SUBCUTANEOUS | Status: DC
  Administered 2017-12-26 – 2017-12-27 (×2): 10 [IU] via SUBCUTANEOUS
  Filled 2017-12-26 (×2): qty 0.1

## 2017-12-26 MED ORDER — INSULIN ASPART 100 UNIT/ML ~~LOC~~ SOLN
0.0000 [IU] | Freq: Three times a day (TID) | SUBCUTANEOUS | Status: DC
  Administered 2017-12-26: 8 [IU] via SUBCUTANEOUS
  Administered 2017-12-27: 5 [IU] via SUBCUTANEOUS
  Administered 2017-12-27: 15 [IU] via SUBCUTANEOUS
  Administered 2017-12-27: 5 [IU] via SUBCUTANEOUS
  Administered 2017-12-28: 3 [IU] via SUBCUTANEOUS
  Administered 2017-12-28: 8 [IU] via SUBCUTANEOUS
  Administered 2017-12-28: 2 [IU] via SUBCUTANEOUS
  Administered 2017-12-29: 3 [IU] via SUBCUTANEOUS
  Administered 2017-12-29: 15 [IU] via SUBCUTANEOUS
  Administered 2017-12-29 – 2017-12-30 (×2): 2 [IU] via SUBCUTANEOUS
  Administered 2017-12-30: 3 [IU] via SUBCUTANEOUS
  Administered 2017-12-30 – 2017-12-31 (×2): 5 [IU] via SUBCUTANEOUS
  Filled 2017-12-26 (×7): qty 1
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
## 2017-12-26 NOTE — Plan of Care (Signed)
  Problem: Education: Goal: Ability to make informed decisions regarding treatment will improve Outcome: Progressing  Patient currently denies suicidal ideation.

## 2017-12-26 NOTE — Plan of Care (Signed)
Patient up ad lib in the milieu with a slow steady gait. Very tearful today states, "I'm just tired of feeling sick, but no I don't want to kill myself." Blood sugar remains elevated, a referral for the hospitalist put in by Dr. He. New orders put in for patient, see MAR for details. Present in the milieu with flat and sad affect. Compliant with treatment plan. Will continue to monitor.

## 2017-12-26 NOTE — Plan of Care (Signed)
Patient is adjusting accordingly and monitoring her abnormal blood sugar as part of her recovery, patient contract for safety and 15 minute rounding for safety is in progress.   Problem: Consults Goal: Concurrent Medical Patient Education Description (See Patient Education Module for education specifics) Outcome: Progressing   Problem: BHH Concurrent Medical Problem Goal: LTG-Pt will be physically stable and he/significant other Description (Patient will be physically stable and he/significant other will be able to verbalize understanding of follow-up care and symptoms that would warrant further treatment) Outcome: Progressing Goal: STG-Vital signs will be within defined limits or stabilized Description (STG- Vital signs will be within defined limits or stabilized for individual) Outcome: Progressing Goal: STG-Compliance with medication and/or treatment as ordered Description (STG-Compliance with medication and/or treatment as ordered by MD) Outcome: Progressing Goal: STG-Verbalize two symptoms that would warrant further Description (STG-Verbalize two symptoms that would warrant further treatment) Outcome: Progressing Goal: STG-Patient will participate in management/stabilization Description (STG-Patient will participate in management/stabilization of medical condition) Outcome: Progressing Goal: STG-Other (Specify): Description STG-Other Concurrent Medical (Specify): Outcome: Progressing   Problem: Education: Goal: Ability to make informed decisions regarding treatment will improve Outcome: Progressing   Problem: Coping: Goal: Coping ability will improve Outcome: Progressing   Problem: Health Behavior/Discharge Planning: Goal: Identification of resources available to assist in meeting health care needs will improve Outcome: Progressing   Problem: Medication: Goal: Compliance with prescribed medication regimen will improve Outcome: Progressing   Problem:  Self-Concept: Goal: Ability to disclose and discuss suicidal ideas will improve Outcome: Progressing Goal: Will verbalize positive feelings about self Outcome: Progressing   Problem: Education: Goal: Knowledge of disease or condition will improve Outcome: Progressing Goal: Understanding of discharge needs will improve Outcome: Progressing   Problem: Health Behavior/Discharge Planning: Goal: Ability to identify changes in lifestyle to reduce recurrence of condition will improve Outcome: Progressing Goal: Identification of resources available to assist in meeting health care needs will improve Outcome: Progressing   Problem: Physical Regulation: Goal: Complications related to the disease process, condition or treatment will be avoided or minimized Outcome: Progressing   Problem: Safety: Goal: Ability to remain free from injury will improve Outcome: Progressing

## 2017-12-26 NOTE — BHH Suicide Risk Assessment (Signed)
[facility] Admission Suicide Risk Assessment   Nursing information obtained from:  Patient Demographic factors:  Low socioeconomic status, Unemployed Current Mental Status:  NA Loss Factors:  Decline in physical health, Financial problems / change in socioeconomic status Historical Factors:  Impulsivity Risk Reduction Factors:  Religious beliefs about death, Living with another person, especially a relative  Total Time spent with patient: 1 hour Principal Problem: Severe alcohol use disorder ([place]) Diagnosis:   Patient Active Problem List   Diagnosis Date Noted  . Bipolar 2 disorder ([place]) [F31.81] 12/25/2017  . Diabetes mellitus without complication ([place]) [R15.9] 12/25/2017  . Severe alcohol use disorder ([place]) [F10.20] 12/25/2017  . Cocaine abuse ([place]) [F14.10] 12/25/2017   Subjective Data:  See HPI  Continued Clinical Symptoms:  Alcohol Use Disorder Identification Test Final Score (AUDIT): 31 The "Alcohol Use Disorders Identification Test", Guidelines for Use in Primary Care, Second Edition.  World Pharmacologist [facility]). Score between 0-7:  no or low risk or alcohol related problems. Score between 8-15:  moderate risk of alcohol related problems. Score between 16-19:  high risk of alcohol related problems. Score 20 or above:  warrants further diagnostic evaluation for alcohol dependence and treatment.   CLINICAL FACTORS:   Depression:   Anhedonia Hopelessness Insomnia Alcohol/Substance Abuse/Dependencies   Musculoskeletal: Strength & Muscle Tone: within normal limits Gait & Station: normal Patient leans: N/A  Psychiatric Specialty Exam: Physical Exam  ROS  Blood pressure 120/76, pulse 86, temperature 98.2 F (36.8 C), temperature source Oral, resp. rate 18, height 5\' 8"  (1.727 m), weight 68.4 kg, SpO2 100 %.Body mass index is 22.91 kg/m.  Sleep:  Number of Hours: 5.75   See HPI   COGNITIVE FEATURES THAT CONTRIBUTE TO RISK:  Closed-mindedness    SUICIDE RISK:   Mild:  Suicidal ideation of limited frequency, intensity, duration, and specificity.  There are no identifiable plans, no associated intent, mild dysphoria and related symptoms, good self-control (both objective and subjective assessment), few other risk factors, and identifiable protective factors, including available and accessible social support.  PLAN OF CARE: see HPI  I certify that inpatient services furnished can reasonably be expected to improve the patient's condition.   Ambra Haverstick, MD 12/26/2017, 12:45 PM

## 2017-12-26 NOTE — BHH Group Notes (Signed)
LCSW Group Therapy Note  12/26/2017 1:15pm  Type of Therapy and Topic:  Group Therapy:  Cognitive Distortions  Participation Level:  Did Not Attend   Description of Group:    Patients in this group will be introduced to the topic of cognitive distortions.  Patients will identify and describe cognitive distortions, describe the feelings these distortions create for them.  Patients will identify one or more situations in their personal life where they have cognitively distorted thinking and will verbalize challenging this cognitive distortion through positive thinking skills.  Patients will practice the skill of using positive affirmations to challenge cognitive distortions using affirmation cards.    Therapeutic Goals:  1. Patient will identify two or more cognitive distortions they have used 2. Patient will identify one or more emotions that stem from use of a cognitive distortion 3. Patient will demonstrate use of a positive affirmation to counter a cognitive distortion through discussion and/or role play. 4. Patient will describe one way cognitive distortions can be detrimental to wellness   Summary of Patient Progress: Pt was invited to attend group but chose not to attend. CSW will continue to encourage pt to attend group throughout their admission.      Therapeutic Modalities:   Cognitive Behavioral Therapy Motivational Interviewing   Claude Swendsen  CUEBAS-COLON, LCSW 12/26/2017 11:31 AM

## 2017-12-26 NOTE — Consult Note (Signed)
[facility] [place] NAME: Julie Lambert    MR#:  [id number]  DATE OF BIRTH:  Sep 07, 1965  DATE OF CONSULT:  12/26/2017  PRIMARY CARE PHYSICIAN: Patient, No Pcp Per   REQUESTING/REFERRING PHYSICIAN: Dr. He.   CHIEF COMPLAINT:  No chief complaint on file. Hyperglycemia, Uncontrolled DM  HISTORY OF PRESENT ILLNESS:  Julie Lambert  is a 52 y.o. female with a known history of diabetes, bipolar disorder, history of diverticulitis, history of fibroids who was admitted to behavioral medicine due to depression/suicidal ideations.  Hospitalist services were contacted for management of her uncontrolled blood sugars and diabetes.  As per the patient she has ran out of her diabetes medicine about a week or so ago.  She does not check correct sugars regularly and has not seen a doctor in quite a while.  She has never been on insulin before.  She tries to follow carb controlled diet but is usually not compliant with it.  She was admitted to the hospital due to suicidal ideations and depression with polysubstance abuse and hospitalist services were contacted for management of her diabetes.  PAST MEDICAL HISTORY:   Past Medical History:  Diagnosis Date  . Bipolar 2 disorder ([place])   . Diabetes mellitus without complication ([place])   . Diverticulitis   . Diverticulosis   . Uterine fibroid     PAST SURGICAL HISTOIRY:   Past Surgical History:  Procedure Laterality Date  . BACK SURGERY    . CHOLECYSTECTOMY    . LUMBAR SPINE SURGERY      SOCIAL HISTORY:   Social History   Tobacco Use  . Smoking status: Current Every Day Smoker    Packs/day: 0.50    Years: 35.00    Pack years: 17.50    Types: Cigarettes  . Smokeless tobacco: Never Used  Substance Use Topics  . Alcohol use: Yes    Comment: last use 12/03/2017    FAMILY HISTORY:   Family History  Problem Relation Age of Onset  . CAD Mother   . Diabetes Father     DRUG ALLERGIES:    Allergies  Allergen Reactions  . Iodinated Diagnostic Agents Itching    Patient stated she had itching on her back after IV injection    REVIEW OF SYSTEMS:   Review of Systems  Constitutional: Negative for fever and weight loss.  HENT: Negative for congestion, nosebleeds and tinnitus.   Eyes: Negative for blurred vision, double vision and redness.  Respiratory: Negative for cough, hemoptysis and shortness of breath.   Cardiovascular: Negative for chest pain, orthopnea, leg swelling and PND.  Gastrointestinal: Negative for abdominal pain, diarrhea, melena, nausea and vomiting.  Genitourinary: Negative for dysuria, hematuria and urgency.  Musculoskeletal: Negative for falls and joint pain.  Neurological: Negative for dizziness, tingling, sensory change, focal weakness, seizures, weakness and headaches.  Endo/Heme/Allergies: Negative for polydipsia. Does not bruise/bleed easily.  Psychiatric/Behavioral: Positive for depression and suicidal ideas. Negative for memory loss. The patient is not nervous/anxious.      MEDICATIONS AT HOME:   Prior to Admission medications   Medication Sig Start Date End Date Taking? Authorizing Provider  albuterol (PROVENTIL HFA;VENTOLIN HFA) 108 (90 Base) MCG/ACT inhaler Inhale 2 puffs into the lungs every 6 (six) hours as needed for wheezing or shortness of breath. 05/28/17   Schuyler Amor, MD  ARIPiprazole (ABILIFY) 5 MG tablet Take 1 tablet (5 mg total) by mouth daily. Patient not taking: Reported on 09/20/2017 07/12/17  Hinda Kehr, MD  cyclobenzaprine (FLEXERIL) 5 MG tablet Take 1 tablet (5 mg total) by mouth 3 (three) times daily as needed. 12/07/17   Menshew, Dannielle Karvonen, PA-C  glipiZIDE (GLUCOTROL) 5 MG tablet Take 0.5 tablets (2.5 mg total) by mouth 2 (two) times daily. 09/21/17 11/20/17  Harvest Dark, MD  ketorolac (TORADOL) 10 MG tablet Take 1 tablet (10 mg total) by mouth every 8 (eight) hours. 12/07/17   Menshew, Dannielle Karvonen, PA-C   metFORMIN (GLUCOPHAGE) 500 MG tablet Take 1 tablet (500 mg total) by mouth 2 (two) times daily with a meal. 07/12/17 07/12/18  Hinda Kehr, MD  ondansetron (ZOFRAN ODT) 4 MG disintegrating tablet Take 1 tablet (4 mg total) by mouth every 8 (eight) hours as needed for nausea or vomiting. 11/25/17   Rudene Re, MD  traMADol (ULTRAM) 50 MG tablet Take 1 tablet (50 mg total) by mouth every 6 (six) hours as needed. 11/25/17 11/25/18  Rudene Re, MD      VITAL SIGNS:  Blood pressure 120/76, pulse 86, temperature 98.2 F (36.8 C), temperature source Oral, resp. rate 18, height 5\' 8"  (1.727 m), weight 68.4 kg, SpO2 100 %.  PHYSICAL EXAMINATION:  GENERAL:  52 y.o.-year-old patient lying in the bed with no acute distress.  EYES: Pupils equal, round, reactive to light and accommodation. No scleral icterus. Extraocular muscles intact.  HEENT: Head atraumatic, normocephalic. Oropharynx and nasopharynx clear.  NECK:  Supple, no jugular venous distention. No thyroid enlargement, no tenderness.  LUNGS: Normal breath sounds bilaterally, no wheezing, rales, rhonchi . No use of accessory muscles of respiration.  CARDIOVASCULAR: S1, S2, RRR. No murmurs, rubs, gallops, clicks.  ABDOMEN: Soft, nontender, nondistended. Bowel sounds present. No organomegaly or mass.  EXTREMITIES: No pedal edema, cyanosis, or clubbing.  NEUROLOGIC: Cranial nerves II through XII are intact. No focal motor or sensory deficits appreciated bilaterally  PSYCHIATRIC: The patient is alert and oriented x 3. Flat Affect SKIN: No obvious rash, lesion, or ulcer.   LABORATORY PANEL:   CBC Recent Labs  Lab 12/25/17 0956  WBC 15.5*  HGB 15.2*  HCT 43.2  PLT 278   ------------------------------------------------------------------------------------------------------------------  Chemistries  Recent Labs  Lab 12/25/17 0956  NA 134*  K 3.8  CL 100  CO2 20*  GLUCOSE 258*  BUN 8  CREATININE 0.41*  CALCIUM 9.3  AST  15  ALT 18  ALKPHOS 101  BILITOT 1.0   ------------------------------------------------------------------------------------------------------------------  Cardiac Enzymes No results for input(s): TROPONINI in the last 168 hours. ------------------------------------------------------------------------------------------------------------------  RADIOLOGY:  No results found.   IMPRESSION AND PLAN:   52 year old female with past medical history of diabetes, bipolar disorder, history of diverticulosis who presents to the hospital due to depression/suicidal ideations and noted to have uncontrolled blood sugars.  1.  Diabetes type 2 with peripheral neuropathy-patient's blood sugars are somewhat uncontrolled. - Hemoglobin A1c was as high as 11.6.  Patient has never been on insulin before.  I will start the patient on some low-dose Lantus, continue her glipizide, metformin, adjust her sliding scale insulin to a moderate scale.  Follow blood sugars.  Continue carb controlled diet. -I would also consult diabetes coordinator.  2.  Depression/suicidal ideations-patient is admitted under behavioral medicine.  Continue further care as per psychiatry.  3.  Alcohol abuse- high risk of alcohol withdrawal.  Continue Librium. -Continue thiamine, folate.  4.  Tobacco abuse-continue nicotine patch.  Thanks for the consult and will follow with you.   All the records are reviewed and  case discussed with Consulting provider. Management plans discussed with the patient, family and they are in agreement.  CODE STATUS: Full code  TOTAL TIME TAKING CARE OF THIS PATIENT: 40 minutes.    Henreitta Leber M.D on 12/26/2017 at 1:17 PM  Between 7am to 6pm - Pager - [phone]  After 6pm go to [email or web address] - password [email or web address] [facility]  Office  [phone]  CC: Primary care Physician: Patient, No Pcp Per

## 2017-12-26 NOTE — Tx Team (Signed)
Initial Treatment Plan 12/26/2017 5:17 AM [name]    PATIENT STRESSORS: Financial difficulties Medication change or noncompliance Substance abuse   PATIENT STRENGTHS: Capable of independent living General fund of knowledge Motivation for treatment/growth   PATIENT IDENTIFIED PROBLEMS: Substance abuse     Suicidal ideation                 DISCHARGE CRITERIA:  Improved stabilization in mood, thinking, and/or behavior Motivation to continue treatment in a less acute level of care Reduction of life-threatening or endangering symptoms to within safe limits  PRELIMINARY DISCHARGE PLAN: Attend 12-step recovery group Outpatient therapy  PATIENT/FAMILY INVOLVEMENT: This treatment plan has been presented to and reviewed with the patient, Julie Lambert, The patient and family have been given the opportunity to ask questions and make suggestions.  Harl Bowie, RN 12/26/2017, 5:17 AM

## 2017-12-26 NOTE — Progress Notes (Addendum)
Inpatient Diabetes Program Recommendations  AACE/ADA: New Consensus Statement on Inpatient Glycemic Control (2019)  Target Ranges:  Prepandial:   less than 140 mg/dL      Peak postprandial:   less than 180 mg/dL (1-2 hours)      Critically ill patients:  140 - 180 mg/dL  Results for Julie, Lambert (MRN [id number]) as of 12/26/2017 14:51  Ref. Range 12/25/2017 09:58 12/25/2017 17:03 12/25/2017 23:05 12/26/2017 07:05 12/26/2017 11:21  Glucose-Capillary Latest Ref Range: 70 - 99 mg/dL 255 (H) 296 (H) 471 (H) 353 (H) 263 (H)    Review of Glycemic Control  Diabetes history: DM2 Outpatient Diabetes medications: Glipizide 2.5 mg BID, Metformin 500 mg BID Current orders for Inpatient glycemic control: Glipizide 2.5 mg BID, Metformin 500 mg BID,  Lantus 10 units daily, Novolog 0-15 units TID with meals, Novolog 0-5 units QHS   Inpatient Diabetes Program Recommendations:  Insulin - Basal: Noted Lantus 10 units daily ordered today.  Recommend increasing Lantus to 15 units daily. HgbA1C: A1C 12.5% on 12/26/17 indicating an average glucose of 312 mg/dl over the past 2-3 months.  NOTE:. Noted consult for Diabetes Coordinator. Diabetes Coordinator is not on campus over the weekend but available by pager from 8am to 5pm for questions or concerns.  Chart reviewed. Noted patient does not have insurance or PCP. Patient will need assistance with arranging a follow up appointment to establish care at [facility] and for medication assistance at [facility]. Patient may need insulin as an outpatient for DM control.  Thanks, Barnie Alderman, RN, MSN, CDE Diabetes Coordinator Inpatient Diabetes Program [phone] (Team Pager from 8am to 5pm)

## 2017-12-26 NOTE — Progress Notes (Signed)
Admission Note:  71 yr  [place] American Female who presents voluntarily for the treatment of SI and Substance abuse. Patient upon arrival is tearful, sad and visibly shaking stated " I need help, l am tired of living this life" Patient is alert and oriented x 4, endorsed pain in her back, affect is blunted and assertive. Patient appears flat and sad, fidgety and anxious. Patient was given emotional support and encouraged that she is doing the right thing to seek help. Patient was eventually calm and cooperative with admission process. Patient currently denies SI/HI/AVH and contracts for safety upon admission.  Patient stated " l experienced depression and sadness after l lost my job, lost my house and put my 4 year old mother in a nursing home, I was clean for 8 years and then relapsed.   Pt has Past medical Hx of Bipolar disorder, Alcohol abuse, Cocaine use and DM without complication .Patient's CBG was 471 upon admission, MD on cqll notified and standing order for novolog and glucotrol was given.  Skin was assessed and found to be clear of any abnormal marks and  she was also searched and no contraband found.  POC and unit policies explained and understanding verbalized. Consents obtained. Food and fluids offered, and fluids accepted. Patient was oriented to the unit. 15 minutes safety checks maintained will continue to closely monitor.

## 2017-12-27 LAB — GLUCOSE, CAPILLARY
GLUCOSE-CAPILLARY: 225 mg/dL — AB (ref 70–99)
Glucose-Capillary: 358 mg/dL — ABNORMAL HIGH (ref 70–99)
Glucose-Capillary: 226 mg/dL — ABNORMAL HIGH (ref 70–99)
Glucose-Capillary: 272 mg/dL — ABNORMAL HIGH (ref 70–99)

## 2017-12-27 MED ORDER — INSULIN GLARGINE 100 UNIT/ML ~~LOC~~ SOLN
20.0000 [IU] | Freq: Every day | SUBCUTANEOUS | Status: DC
  Filled 2017-12-27: qty 0.2

## 2017-12-27 MED ORDER — INSULIN GLARGINE 100 UNIT/ML ~~LOC~~ SOLN
7.0000 [IU] | Freq: Once | SUBCUTANEOUS | Status: AC
  Administered 2017-12-27: 7 [IU] via SUBCUTANEOUS
  Filled 2017-12-27: qty 0.07

## 2017-12-27 MED ORDER — INSULIN ASPART 100 UNIT/ML ~~LOC~~ SOLN
4.0000 [IU] | Freq: Three times a day (TID) | SUBCUTANEOUS | Status: DC
  Administered 2017-12-27 – 2017-12-28 (×3): 4 [IU] via SUBCUTANEOUS
  Filled 2017-12-27 (×2): qty 1

## 2017-12-27 MED ORDER — GABAPENTIN 300 MG PO CAPS
300.0000 mg | ORAL_CAPSULE | Freq: Two times a day (BID) | ORAL | Status: DC
  Administered 2017-12-27 – 2017-12-30 (×6): 300 mg via ORAL
  Filled 2017-12-27 (×7): qty 1

## 2017-12-27 MED ORDER — CHLORDIAZEPOXIDE HCL 10 MG PO CAPS
50.0000 mg | ORAL_CAPSULE | Freq: Four times a day (QID) | ORAL | Status: DC | PRN
  Administered 2017-12-30: 50 mg via ORAL
  Filled 2017-12-27: qty 5

## 2017-12-27 MED ORDER — NICOTINE POLACRILEX 2 MG MT GUM
2.0000 mg | CHEWING_GUM | OROMUCOSAL | Status: DC
  Administered 2017-12-27 – 2017-12-31 (×14): 2 mg via ORAL
  Filled 2017-12-27 (×14): qty 1

## 2017-12-27 MED ORDER — CHLORDIAZEPOXIDE HCL 25 MG PO CAPS
25.0000 mg | ORAL_CAPSULE | Freq: Three times a day (TID) | ORAL | Status: DC
  Administered 2017-12-27 – 2017-12-28 (×2): 25 mg via ORAL
  Filled 2017-12-27 (×2): qty 1

## 2017-12-27 MED ORDER — INSULIN GLARGINE 100 UNIT/ML ~~LOC~~ SOLN
10.0000 [IU] | Freq: Once | SUBCUTANEOUS | Status: DC
  Filled 2017-12-27: qty 0.1

## 2017-12-27 NOTE — Plan of Care (Signed)
Patient is alert and oriented x 4. She was very tearful this morning complaining of her leg hurts from sciatica. Librium for withdrawals and gabapentin for nerve pain added to the medication regimen. She also reports vaginal discomfort with pain, and wants to see an GYN. MD made aware of her current conditions. Present in the milieu with selective peer interaction noted. BS monitored and treated accordingly.

## 2017-12-27 NOTE — Progress Notes (Signed)
Inpatient Diabetes Program Recommendations  AACE/ADA: New Consensus Statement on Inpatient Glycemic Control (2019)  Target Ranges:  Prepandial:   less than 140 mg/dL      Peak postprandial:   less than 180 mg/dL (1-2 hours)      Critically ill patients:  140 - 180 mg/dL   Results for Julie Lambert, Julie Lambert (MRN [id number]) as of 12/27/2017 08:13  Ref. Range 12/26/2017 16:26 12/26/2017 20:49 12/27/2017 07:04  Glucose-Capillary Latest Ref Range: 70 - 99 mg/dL 259 (H) 207 (H) 358 (H)   Results for Julie Lambert, Julie Lambert (MRN [id number]) as of 12/26/2017 14:51  Ref. Range 12/25/2017 09:58 12/25/2017 17:03 12/25/2017 23:05 12/26/2017 07:05 12/26/2017 11:21  Glucose-Capillary Latest Ref Range: 70 - 99 mg/dL 255 (H) 296 (H) 471 (H) 353 (H) 263 (H)   Results for Julie Lambert, Julie Lambert (MRN [id number]) as of 12/27/2017 08:13  Ref. Range 12/26/2017 06:19  Hemoglobin A1C Latest Ref Range: 4.8 - 5.6 % 12.5 (H)   Review of Glycemic Control  Diabetes history: DM2 Outpatient Diabetes medications: Glipizide 2.5 mg BID, Metformin 500 mg BID Current orders for Inpatient glycemic control: Glipizide 2.5 mg BID, Metformin 500 mg BID,  Lantus 10 units daily, Novolog 0-15 units TID with meals, Novolog 0-5 units QHS   Inpatient Diabetes Program Recommendations:  Insulin - Basal: Please consider increasing Lantus to 15 units daily. Since patient has received Lantus 10 units already today, please order one time Lantus 5 units x1 now (for total of 15 units today). Insulin-Meal Coverage: Please consider ordering Novolog 4 units TID with meals for meal coverage. HgbA1C: A1C 12.5% on 12/26/17 indicating an average glucose of 312 mg/dl over the past 2-3 months.  NOTE:. Noted consult for Diabetes Coordinator. Diabetes Coordinator is not on campus over the weekend but available by pager from 8am to 5pm for questions or concerns.  Chart reviewed. Noted patient does not have insurance or PCP. Patient will need  assistance with arranging a follow up appointment to establish care at [facility] and for medication assistance at [facility]. Patient may need insulin as an outpatient for DM control. Diabetes Coordinator will follow up with patient on Monday 12/28/17.  Thanks, Barnie Alderman, RN, MSN, CDE Diabetes Coordinator Inpatient Diabetes Program [phone] (Team Pager from 8am to 5pm)

## 2017-12-27 NOTE — [facility] Group Notes (Signed)
LCSW Group Therapy Note 12/27/2017 1:15pm  Type of Therapy and Topic: Group Therapy: Feelings Around Returning Home & Establishing a Supportive Framework and Supporting Oneself When Supports Not Available  Participation Level: None  Description of Group:  Patients first processed thoughts and feelings about upcoming discharge. These included fears of upcoming changes, lack of change, new living environments, judgements and expectations from others and overall stigma of mental health issues. The group then discussed the definition of a supportive framework, what that looks and feels like, and how do to discern it from an unhealthy non-supportive network. The group identified different types of supports as well as what to do when your family/friends are less than helpful or unavailable  Therapeutic Goals  1. Patient will identify one healthy supportive network that they can use at discharge. 2. Patient will identify one factor of a supportive framework and how to tell it from an unhealthy network. 3. Patient able to identify one coping skill to use when they do not have positive supports from others. 4. Patient will demonstrate ability to communicate their needs through discussion and/or role plays.  Summary of Patient Progress:  Pt came in to group a few minutes before the end. Pt did not participate in group discussion.   Therapeutic Modalities Cognitive Behavioral Therapy Motivational Interviewing   Julie State  CUEBAS-COLON, LCSW 12/27/2017 12:30 PM

## 2017-12-27 NOTE — Progress Notes (Signed)
[facility] MD Progress Note  12/27/2017 1:55 PM Julie Lambert  MRN:  [id number] Subjective:  Pt seen and chart reviewed.  Pt is very tearful this morning. She said that her leg hurts from sciatica, and she is very nauseated, but she was able to eat and keep food down.  She feels that her withdrawal from alcohol is getting worse.   We agreed to add a standing librium to help with the withdrawal and gabapentin for nerve pain.   She also reports vaginal discomfort with pain, and wants to see an GYN.   She denied SI.   Her BG is still not fully controlled, the diabetic coordinator consult is ordered.   Principal Problem: Severe alcohol use disorder ([place]) Diagnosis:   Patient Active Problem List   Diagnosis Date Noted  . Bipolar 2 disorder ([place]) [F31.81] 12/25/2017  . Diabetes mellitus without complication ([place]) [F81.8] 12/25/2017  . Severe alcohol use disorder ([place]) [F10.20] 12/25/2017  . Cocaine abuse ([place]) [F14.10] 12/25/2017   Total Time spent with patient: 30 minutes  Past Psychiatric History: alcohol use disorder, cocaine use disorder and depression  Past Medical History:  Past Medical History:  Diagnosis Date  . Bipolar 2 disorder ([place])   . Diabetes mellitus without complication ([place])   . Diverticulitis   . Diverticulosis   . Uterine fibroid     Past Surgical History:  Procedure Laterality Date  . BACK SURGERY    . CHOLECYSTECTOMY    . LUMBAR SPINE SURGERY     Family History:  Family History  Problem Relation Age of Onset  . CAD Mother   . Diabetes Father    Family Psychiatric  History: unknown Social History:  Social History   Substance and Sexual Activity  Alcohol Use Yes   Comment: last use 12/03/2017     Social History   Substance and Sexual Activity  Drug Use Yes  . Types: Cocaine, Marijuana    Social History   Socioeconomic History  . Marital status: Divorced    Spouse name: Not on file  . Number of children: Not on file  . Years of  education: Not on file  . Highest education level: Not on file  Occupational History  . Not on file  Social Needs  . Financial resource strain: Not on file  . Food insecurity:    Worry: Not on file    Inability: Not on file  . Transportation needs:    Medical: Not on file    Non-medical: Not on file  Tobacco Use  . Smoking status: Current Every Day Smoker    Packs/day: 0.50    Years: 35.00    Pack years: 17.50    Types: Cigarettes  . Smokeless tobacco: Never Used  Substance and Sexual Activity  . Alcohol use: Yes    Comment: last use 12/03/2017  . Drug use: Yes    Types: Cocaine, Marijuana  . Sexual activity: Yes  Lifestyle  . Physical activity:    Days per week: Not on file    Minutes per session: Not on file  . Stress: Not on file  Relationships  . Social connections:    Talks on phone: Not on file    Gets together: Not on file    Attends religious service: Not on file    Active member of club or organization: Not on file    Attends meetings of clubs or organizations: Not on file    Relationship status: Not on file  Other Topics Concern  . Not on file  Social History Narrative  . Not on file   Additional Social History:   Sleep: Poor  Appetite:  Fair  Current Medications: Current Facility-Administered Medications  Medication Dose Route Frequency Provider Last Rate Last Dose  . acetaminophen (TYLENOL) tablet 650 mg  650 mg Oral Q6H PRN Clapacs, [name], MD   650 mg at 12/26/17 2117  . albuterol (PROVENTIL HFA;VENTOLIN HFA) 108 (90 Base) MCG/ACT inhaler 2 puff  2 puff Inhalation Q6H PRN Clapacs, [name], MD      . alum & mag hydroxide-simeth (MAALOX/MYLANTA) 200-200-20 MG/5ML suspension 30 mL  30 mL Oral Q4H PRN Clapacs, [name], MD      . chlordiazePOXIDE (LIBRIUM) capsule 25 mg  25 mg Oral TID Tameem Pullara, MD      . chlordiazePOXIDE (LIBRIUM) capsule 50 mg  50 mg Oral QID PRN Enzio Buchler, MD      . folic acid (FOLVITE) tablet 1 mg  1 mg Oral Daily Clapacs, [name], MD    1 mg at 12/27/17 0806  . gabapentin (NEURONTIN) capsule 300 mg  300 mg Oral BID Dhana Totton, MD      . glipiZIDE (GLUCOTROL) tablet 2.5 mg  2.5 mg Oral BID AC Clapacs, [name], MD   2.5 mg at 12/27/17 0806  . hydrOXYzine (ATARAX/VISTARIL) tablet 25 mg  25 mg Oral TID PRN Clapacs, [name], MD   25 mg at 12/27/17 0813  . insulin aspart (novoLOG) injection 0-15 Units  0-15 Units Subcutaneous TID WC Henreitta Leber, MD   5 Units at 12/27/17 1132  . insulin aspart (novoLOG) injection 0-5 Units  0-5 Units Subcutaneous QHS Henreitta Leber, MD   2 Units at 12/26/17 2118  . insulin aspart (novoLOG) injection 4 Units  4 Units Subcutaneous TID WC Gouru, Aruna, MD   4 Units at 12/27/17 1133  . [START ON 12/28/2017] insulin glargine (LANTUS) injection 20 Units  20 Units Subcutaneous Daily Gouru, Aruna, MD      . magnesium hydroxide (MILK OF MAGNESIA) suspension 30 mL  30 mL Oral Daily PRN Clapacs, [name], MD      . metFORMIN (GLUCOPHAGE) tablet 500 mg  500 mg Oral BID WC Clapacs, [name], MD   500 mg at 12/27/17 0806  . multivitamin with minerals tablet 1 tablet  1 tablet Oral Daily Clapacs, [name], MD   1 tablet at 12/27/17 0806  . nicotine (NICODERM CQ - dosed in mg/24 hours) patch 21 mg  21 mg Transdermal Daily Caswell Alvillar, MD   21 mg at 12/27/17 0805  . pneumococcal 23 valent vaccine (PNU-IMMUNE) injection 0.5 mL  0.5 mL Intramuscular Tomorrow-1000 McNew, [name], MD      . thiamine (VITAMIN B-1) tablet 100 mg  100 mg Oral Daily Clapacs, [name], MD   100 mg at 12/27/17 4196   Or  . thiamine (B-1) injection 100 mg  100 mg Intravenous Daily Clapacs, [name], MD      . traZODone (DESYREL) tablet 100 mg  100 mg Oral QHS Clapacs, [name], MD   100 mg at 12/26/17 2117    Lab Results:  Results for orders placed or performed during the hospital encounter of 12/25/17 (from the past 48 hour(s))  Glucose, capillary     Status: Abnormal   Collection Time: 12/25/17 11:05 PM  Result Value Ref Range   Glucose-Capillary 471 (H)  70 - 99 mg/dL   Comment  1 Notify RN   Hemoglobin A1c     Status: Abnormal   Collection Time: 12/26/17  6:19 AM  Result Value Ref Range   Hgb A1c MFr Bld 12.5 (H) 4.8 - 5.6 %    Comment: (NOTE) Pre diabetes:          5.7%-6.4% Diabetes:              >6.4% Glycemic control for   <7.0% adults with diabetes    Mean Plasma Glucose 312.05 mg/dL    Comment: Performed at [facility] [address] [place], [place] [address]  Lipid panel     Status: Abnormal   Collection Time: 12/26/17  6:19 AM  Result Value Ref Range   Cholesterol 215 (H) 0 - 200 mg/dL   Triglycerides 179 (H) <150 mg/dL   HDL 65 >40 mg/dL   Total CHOL/HDL Ratio 3.3 RATIO   VLDL 36 0 - 40 mg/dL   LDL Cholesterol 114 (H) 0 - 99 mg/dL    Comment:        Total Cholesterol/HDL:CHD Risk Coronary Heart Disease Risk Table                     Men   Women  1/2 Average Risk   3.4   3.3  Average Risk       5.0   4.4  2 X Average Risk   9.6   7.1  3 X Average Risk  23.4   11.0        Use the calculated Patient Ratio above and the CHD Risk Table to determine the patient's CHD Risk.        ATP III CLASSIFICATION (LDL):  <100     mg/dL   Optimal  100-129  mg/dL   Near or Above                    Optimal  130-159  mg/dL   Borderline  160-189  mg/dL   High  >190     mg/dL   Very High Performed at [facility], [address] [place], [place] [address]   TSH     Status: None   Collection Time: 12/26/17  6:19 AM  Result Value Ref Range   TSH 1.335 0.350 - 4.500 uIU/mL    Comment: Performed by a 3rd Generation assay with a functional sensitivity of <=0.01 uIU/mL. Performed at [facility], [address] [place], [place] [address]   Glucose, capillary     Status: Abnormal   Collection Time: 12/26/17  7:05 AM  Result Value Ref Range   Glucose-Capillary 353 (H) 70 - 99 mg/dL   Comment 1 Notify RN   Glucose, capillary     Status: Abnormal   Collection Time: 12/26/17 11:21 AM  Result Value Ref  Range   Glucose-Capillary 263 (H) 70 - 99 mg/dL  Glucose, capillary     Status: Abnormal   Collection Time: 12/26/17  4:26 PM  Result Value Ref Range   Glucose-Capillary 259 (H) 70 - 99 mg/dL  Glucose, capillary     Status: Abnormal   Collection Time: 12/26/17  8:49 PM  Result Value Ref Range   Glucose-Capillary 207 (H) 70 - 99 mg/dL   Comment 1 Notify RN   Glucose, capillary     Status: Abnormal   Collection Time: 12/27/17  7:04 AM  Result Value Ref Range   Glucose-Capillary 358 (H) 70 - 99 mg/dL  Comment 1 Notify RN   Glucose, capillary     Status: Abnormal   Collection Time: 12/27/17 10:59 AM  Result Value Ref Range   Glucose-Capillary 225 (H) 70 - 99 mg/dL    Blood Alcohol level:  Lab Results  Component Value Date   ETH <10 16/11/9602    Metabolic Disorder Labs: Lab Results  Component Value Date   HGBA1C 12.5 (H) 12/26/2017   MPG 312.05 12/26/2017   No results found for: PROLACTIN Lab Results  Component Value Date   CHOL 215 (H) 12/26/2017   TRIG 179 (H) 12/26/2017   HDL 65 12/26/2017   CHOLHDL 3.3 12/26/2017   VLDL 36 12/26/2017   LDLCALC 114 (H) 12/26/2017   LDLCALC 79 10/09/2011    Physical Findings: AIMS: Facial and Oral Movements Muscles of Facial Expression: None, normal Lips and Perioral Area: None, normal Jaw: None, normal Tongue: None, normal,Extremity Movements Upper (arms, wrists, hands, fingers): None, normal Lower (legs, knees, ankles, toes): None, normal, Trunk Movements Neck, shoulders, hips: None, normal, Overall Severity Severity of abnormal movements (highest score from questions above): None, normal Incapacitation due to abnormal movements: None, normal Patient's awareness of abnormal movements (rate only patient's report): No Awareness, Dental Status Current problems with teeth and/or dentures?: No Does patient usually wear dentures?: No  CIWA:  CIWA-Ar Total: 5 COWS:  COWS Total Score: 8  Musculoskeletal: Strength & Muscle  Tone: within normal limits Gait & Station: normal Patient leans: N/A  Psychiatric Specialty Exam: Physical Exam  ROS  Blood pressure 128/82, pulse (!) 102, temperature 98.7 F (37.1 C), temperature source Oral, resp. rate 16, height 5\' 8"  (1.727 m), weight 68.4 kg, SpO2 100 %.Body mass index is 22.91 kg/m.  General Appearance: Fairly Groomed  Eye Contact:  Good  Speech:  Clear and Coherent  Volume:  Normal  Mood:  Depressed  Affect:  Appropriate, Congruent, Constricted, Depressed and Tearful  Thought Process:  Coherent and Linear  Orientation:  Full (Time, Place, and Person)  Thought Content:  Logical  Suicidal Thoughts:  No  Homicidal Thoughts:  No  Memory:  Immediate;   Fair Recent;   Fair Remote;   Fair  Judgement:  Fair  Insight:  Fair  Psychomotor Activity:  Normal  Concentration:  Concentration: Good and Attention Span: Good  Recall:  [employer] of Knowledge:  Fair  Language:  Fair      AIMS (if indicated):     Assets:  Communication Skills Desire for Improvement  ADL's:  Intact  Cognition:  WNL  Sleep:  Number of Hours: 7.15     Treatment Plan Summary: Daily contact with patient to assess and evaluate symptoms and progress in treatment and Medication management   Plan:   # Alcohol use disorder, severe,  -- continue CIWA with PRN Librium -- add standing Librium 25mg  BID for 3 doses.  -- continue supplement of Thiamine and folic acid.   # Cocaine use disorder -- recommend substance abuse counseling.   # Mood disorder:  MDD vs. SIMD -- consider a SSRI after detox.  -- continue trazoodne 100mg  qhs.  -- continue hydroxyzine 25mg  TID PRN.   # DM-II: Poorly controlled CBG, with A1c of 12.5 -- Medicine consult called and Hospitalist saw her yesterday made recommendation as following. Pt has never had regular DM management, her Meds were from frequent ED visits.  -- consult to Diabetes coordinator -- continue Metformin 500mg  BID and Glipizide 2.5mg   BID.  -- Add novolog 4mg  TID with  meals -- Add Lantus 20mg  daily.  -- continue Insulin sliding scales -- Add gabapentin 300mg  BID for diabetic neuropathy.   # Vaginal Bleeding and discomfort --  GYN consult ordered, likely on Monday.   # Lab: -- CBC -- Chem -- CBG   Lorimer Tiberio, MD 12/27/2017, 1:55 PM
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
## 2017-12-27 NOTE — Progress Notes (Signed)
[place] [place] NAME: Julie Lambert    MR#:  [id number]  DATE OF BIRTH:  [birth year]  SUBJECTIVE:  CHIEF COMPLAINT: Feels sad  REVIEW OF SYSTEMS:  CONSTITUTIONAL: No fever, fatigue or weakness.  EYES: No blurred or double vision.  EARS, NOSE, AND THROAT: No tinnitus or ear pain.  RESPIRATORY: No cough, shortness of breath, wheezing or hemoptysis.  CARDIOVASCULAR: No chest pain, orthopnea, edema.  GASTROINTESTINAL: No nausea, vomiting, diarrhea or abdominal pain.  GENITOURINARY: No dysuria, hematuria.  ENDOCRINE: No polyuria, nocturia,  HEMATOLOGY: No anemia, easy bruising or bleeding SKIN: No rash or lesion. MUSCULOSKELETAL: No joint pain or arthritis.   NEUROLOGIC: No tingling, numbness, weakness.  PSYCHIATRY: No anxiety or depression.   DRUG ALLERGIES:   Allergies  Allergen Reactions  . Iodinated Diagnostic Agents Itching    Patient stated she had itching on her back after IV injection    VITALS:  Blood pressure 128/82, pulse (!) 102, temperature 98.7 F (37.1 C), temperature source Oral, resp. rate 16, height 5\' 8"  (1.727 m), weight 68.4 kg, SpO2 100 %.  PHYSICAL EXAMINATION:  GENERAL:  52 y.o.-year-old patient lying in the bed with no acute distress.  EYES: Pupils equal, round, reactive to light and accommodation. No scleral icterus. Extraocular muscles intact.  HEENT: Head atraumatic, normocephalic. Oropharynx and nasopharynx clear.  NECK:  Supple, no jugular venous distention. No thyroid enlargement, no tenderness.  LUNGS: Normal breath sounds bilaterally, no wheezing, rales,rhonchi or crepitation. No use of accessory muscles of respiration.  CARDIOVASCULAR: S1, S2 normal. No murmurs, rubs, or gallops.  ABDOMEN: Soft, nontender, nondistended. Bowel sounds present. No organomegaly or mass.  EXTREMITIES: No pedal edema, cyanosis, or clubbing.  Jittery NEUROLOGIC: Cranial nerves II through XII are intact. Muscle  strength 5/5 in all extremities. Sensation intact. Gait not checked.  PSYCHIATRIC: The patient is alert and oriented x 3.  SKIN: No obvious rash, lesion, or ulcer.    LABORATORY PANEL:   CBC Recent Labs  Lab 12/25/17 0956  WBC 15.5*  HGB 15.2*  HCT 43.2  PLT 278   ------------------------------------------------------------------------------------------------------------------  Chemistries  Recent Labs  Lab 12/25/17 0956  NA 134*  K 3.8  CL 100  CO2 20*  GLUCOSE 258*  BUN 8  CREATININE 0.41*  CALCIUM 9.3  AST 15  ALT 18  ALKPHOS 101  BILITOT 1.0   ------------------------------------------------------------------------------------------------------------------  Cardiac Enzymes No results for input(s): TROPONINI in the last 168 hours. ------------------------------------------------------------------------------------------------------------------  RADIOLOGY:  No results found.  EKG:   Orders placed or performed during the hospital encounter of 11/25/17  . ED EKG  . ED EKG  . EKG 12-Lead  . EKG 12-Lead    ASSESSMENT AND PLAN:   52 year old female with past medical history of diabetes, bipolar disorder, history of diverticulosis who presents to the hospital due to depression/suicidal ideations and noted to have uncontrolled blood sugars.  1.  Diabetes type 2 with peripheral neuropathy-patient's blood sugars are somewhat uncontrolled. - Hemoglobin A1c was as high as 11.6.  Patient has never been on insulin before.  Lantus dose increased to 17 units today , will increase to 20 units from a.m. and titrate  continue her glipizide, metformin, adjust her sliding scale insulin to a moderate scale.  Follow blood sugars.  Continue carb controlled diet. -diabetes coordinator is following  2.  Depression/suicidal ideations-patient is admitted under behavioral medicine.  Continue further care as per psychiatry.  3.  Alcohol abuse- high risk of  alcohol withdrawal.   Continue Librium. -Continue thiamine, folate. -CIWA  4.  Tobacco abuse-continue nicotine patch.     All the records are reviewed and case discussed with Care Management/Social Workerr. Management plans discussed with the patient, family and they are in agreement.  CODE STATUS: fc   TOTAL TIME TAKING CARE OF THIS PATIENT: 33 minutes.    Note: This dictation was prepared with Dragon dictation along with smaller phrase technology. Any transcriptional errors that result from this process are unintentional.   Nicholes Mango M.D on 12/27/2017 at 4:07 PM  Between 7am to 6pm - Pager - [phone] After 6pm go to [email or web address] - password [email or web address] [facility]  Office  [phone]  CC: Primary care physician; Patient, No Pcp Per

## 2017-12-27 NOTE — Progress Notes (Signed)
D: Pt passive SI- contracts for safety, denies AVH/ HI  at this time. Pt is pleasant and cooperative. Pt stated she felt better she talked to her son today. Pt stated she would like to go to a LT Tx facility like [facility]. Pt stated she had a problem sleeping last night even though she was documented with over 6 hrs sleep. Pt stated she wanted to focus on taking care of herself more. Pt visible on the unit some this evening, pt stated that has been an improvement because she previously spent a lot of time in her room.  A: Pt was offered support and encouragement. Pt was given scheduled medications. Pt was encourage to attend groups. Q 15 minute checks were done for safety.  R:Pt attends groups and interacts well with peers and staff. Pt is taking medication. Pt has no complaints.Pt receptive to treatment and safety maintained on unit.  Problem: Consults Goal: Concurrent Medical Patient Education Description (See Patient Education Module for education specifics) Outcome: Progressing   Problem: BHH Concurrent Medical Problem Goal: STG-Compliance with medication and/or treatment as ordered Description (STG-Compliance with medication and/or treatment as ordered by MD) Outcome: Progressing   Problem: Education: Goal: Ability to make informed decisions regarding treatment will improve Outcome: Progressing   Problem: Coping: Goal: Coping ability will improve Outcome: Progressing   Problem: Medication: Goal: Compliance with prescribed medication regimen will improve Outcome: Progressing   Problem: Self-Concept: Goal: Ability to disclose and discuss suicidal ideas will improve Outcome: Progressing   Problem: Self-Concept: Goal: Will verbalize positive feelings about self Outcome: Progressing   Problem: Education: Goal: Understanding of discharge needs will improve Outcome: Progressing

## 2017-12-27 NOTE — Progress Notes (Addendum)
Patient received flu vaccine in R) deltoid. Education provided prior to administering, no adverse reactions noted.

## 2017-12-27 NOTE — [facility] Group Notes (Signed)
[place] Group Notes:  (Nursing/MHT/Case Management/Adjunct)  Date:  12/27/2017  Time:  10:00 PM  Type of Therapy:  Group Therapy  Participation Level:  Active  Participation Quality:  Appropriate  Affect:  Appropriate  Cognitive:  Appropriate  Insight:  Appropriate  Engagement in Group:  Engaged  Modes of Intervention:  Discussion  Summary of Progress/Problems: [name] stated her goal was to sleep. [name] stated she wanted to focus on why she was here. [name] stated she had a good visit with her son. [name] stated he gave her input on where she should go after discharge. [name] stated she had a good day. MHT reviewed rules and expectations of the unit. MHT informed patients of visitation and phone hours. MHT informed patients of what was allowed on the unit. MHT encouraged patient not to share or trade snacks. MHT informed patient's snacks were not allowed in rooms. MHT informed patients that routine checks would be conducted throughout the night and to cover accordingly. MHT informed patients that room temperature could be adjusted as needed. MHT informed patient's not to use last names or share personal information. MHT informed patients of community resources. MHT provided supportive counseling to address making necessary changes in life. MHT encouraged patients to develop discharge plan to address needs in community.  Barnie Mort 12/27/2017, 10:00 PM

## 2017-12-28 LAB — URINALYSIS, COMPLETE (UACMP) WITH MICROSCOPIC
Bilirubin Urine: NEGATIVE
Glucose, UA: 50 mg/dL — AB
HGB URINE DIPSTICK: NEGATIVE
Ketones, ur: NEGATIVE mg/dL
LEUKOCYTES UA: NEGATIVE
Nitrite: NEGATIVE
Protein, ur: NEGATIVE mg/dL
SPECIFIC GRAVITY, URINE: 1.013 (ref 1.005–1.030)
pH: 7 (ref 5.0–8.0)

## 2017-12-28 LAB — GLUCOSE, CAPILLARY
GLUCOSE-CAPILLARY: 161 mg/dL — AB (ref 70–99)
Glucose-Capillary: 281 mg/dL — ABNORMAL HIGH (ref 70–99)
Glucose-Capillary: 147 mg/dL — ABNORMAL HIGH (ref 70–99)
Glucose-Capillary: 138 mg/dL — ABNORMAL HIGH (ref 70–99)

## 2017-12-28 LAB — CHLAMYDIA/NGC RT PCR (ARMC ONLY)
CHLAMYDIA TR: NOT DETECTED
N GONORRHOEAE: NOT DETECTED

## 2017-12-28 LAB — WET PREP, GENITAL
Clue Cells Wet Prep HPF POC: NONE SEEN
SPERM: NONE SEEN
TRICH WET PREP: NONE SEEN
YEAST WET PREP: NONE SEEN

## 2017-12-28 MED ORDER — TRAZODONE HCL 100 MG PO TABS
100.0000 mg | ORAL_TABLET | Freq: Every evening | ORAL | Status: DC | PRN
  Administered 2017-12-28: 100 mg via ORAL
  Filled 2017-12-28: qty 1

## 2017-12-28 MED ORDER — CLOTRIMAZOLE 1 % VA CREA
1.0000 | TOPICAL_CREAM | Freq: Every day | VAGINAL | Status: DC
  Administered 2017-12-29 – 2017-12-30 (×3): 1 via VAGINAL
  Filled 2017-12-28 (×2): qty 45

## 2017-12-28 MED ORDER — INSULIN ASPART 100 UNIT/ML ~~LOC~~ SOLN
8.0000 [IU] | Freq: Three times a day (TID) | SUBCUTANEOUS | Status: DC
  Administered 2017-12-28 – 2017-12-29 (×4): 8 [IU] via SUBCUTANEOUS

## 2017-12-28 MED ORDER — CLOTRIMAZOLE 1 % EX CREA
TOPICAL_CREAM | Freq: Two times a day (BID) | CUTANEOUS | Status: DC
  Administered 2017-12-28 – 2017-12-30 (×4): via TOPICAL
  Administered 2017-12-31: 1 via TOPICAL
  Filled 2017-12-28: qty 15

## 2017-12-28 MED ORDER — ESCITALOPRAM OXALATE 10 MG PO TABS: 10.0000 mg | ORAL_TABLET | Freq: Every day | ORAL | Status: DC

## 2017-12-28 MED ORDER — METFORMIN HCL 500 MG PO TABS
1000.0000 mg | ORAL_TABLET | Freq: Two times a day (BID) | ORAL | Status: DC
  Administered 2017-12-29 – 2017-12-31 (×5): 1000 mg via ORAL
  Filled 2017-12-28 (×5): qty 2

## 2017-12-28 MED ORDER — CHLORDIAZEPOXIDE HCL 25 MG PO CAPS
25.0000 mg | ORAL_CAPSULE | Freq: Two times a day (BID) | ORAL | Status: AC
  Administered 2017-12-29 (×2): 25 mg via ORAL
  Filled 2017-12-28 (×2): qty 1

## 2017-12-28 MED ORDER — POLYVINYL ALCOHOL 1.4 % OP SOLN
2.0000 [drp] | Freq: Two times a day (BID) | OPHTHALMIC | Status: DC
  Administered 2017-12-28 – 2017-12-31 (×5): 2 [drp] via OPHTHALMIC
  Filled 2017-12-28: qty 15

## 2017-12-28 MED ORDER — INSULIN GLARGINE 100 UNIT/ML ~~LOC~~ SOLN
22.0000 [IU] | Freq: Every day | SUBCUTANEOUS | Status: DC
  Administered 2017-12-28 – 2017-12-29 (×2): 22 [IU] via SUBCUTANEOUS
  Filled 2017-12-28 (×3): qty 0.22

## 2017-12-28 MED ORDER — FLUCONAZOLE 50 MG PO TABS
150.0000 mg | ORAL_TABLET | Freq: Every day | ORAL | Status: DC
  Administered 2017-12-28: 150 mg via ORAL
  Filled 2017-12-28 (×2): qty 3

## 2017-12-28 MED ORDER — HYPROMELLOSE (GONIOSCOPIC) 2.5 % OP SOLN: 2.0000 [drp] | Freq: Two times a day (BID) | OPHTHALMIC | Status: DC

## 2017-12-28 MED ORDER — QUETIAPINE FUMARATE 25 MG PO TABS
50.0000 mg | ORAL_TABLET | Freq: Every day | ORAL | Status: DC
  Administered 2017-12-28 – 2017-12-30 (×3): 50 mg via ORAL
  Filled 2017-12-28 (×3): qty 2

## 2017-12-28 MED ORDER — CHLORDIAZEPOXIDE HCL 25 MG PO CAPS
25.0000 mg | ORAL_CAPSULE | Freq: Three times a day (TID) | ORAL | Status: AC
  Administered 2017-12-28 (×3): 25 mg via ORAL
  Filled 2017-12-28 (×3): qty 1

## 2017-12-28 MED ORDER — CHLORDIAZEPOXIDE HCL 25 MG PO CAPS
25.0000 mg | ORAL_CAPSULE | Freq: Once | ORAL | Status: AC
  Administered 2017-12-30: 25 mg via ORAL
  Filled 2017-12-28: qty 1

## 2017-12-28 NOTE — Tx Team (Addendum)
Interdisciplinary Treatment and Diagnostic Plan Update  12/28/2017 Time of Session: 1030am Julie Lambert MRN: [id number]  Principal Diagnosis: Severe alcohol use disorder ([place])  Secondary Diagnoses: Principal Problem:   Severe alcohol use disorder ([place]) Active Problems:   Bipolar 2 disorder ([place])   Diabetes mellitus without complication ([place])   Current Medications:  Current Facility-Administered Medications  Medication Dose Route Frequency Provider Last Rate Last Dose  . acetaminophen (TYLENOL) tablet 650 mg  650 mg Oral Q6H PRN Clapacs, [name], MD   650 mg at 12/28/17 0646  . albuterol (PROVENTIL HFA;VENTOLIN HFA) 108 (90 Base) MCG/ACT inhaler 2 puff  2 puff Inhalation Q6H PRN Clapacs, [name], MD      . alum & mag hydroxide-simeth (MAALOX/MYLANTA) 200-200-20 MG/5ML suspension 30 mL  30 mL Oral Q4H PRN Clapacs, [name], MD      . chlordiazePOXIDE (LIBRIUM) capsule 25 mg  25 mg Oral TID Marylin Crosby, MD   25 mg at 12/28/17 1145  . [START ON 12/29/2017] chlordiazePOXIDE (LIBRIUM) capsule 25 mg  25 mg Oral BID McNew, [name], MD      . Derrill Memo ON 12/30/2017] chlordiazePOXIDE (LIBRIUM) capsule 25 mg  25 mg Oral Once McNew, [name], MD      . chlordiazePOXIDE (LIBRIUM) capsule 50 mg  50 mg Oral QID PRN He, [name], MD      . folic acid (FOLVITE) tablet 1 mg  1 mg Oral Daily Clapacs, [name], MD   1 mg at 12/28/17 0819  . gabapentin (NEURONTIN) capsule 300 mg  300 mg Oral BID He, [name], MD   300 mg at 12/28/17 0818  . glipiZIDE (GLUCOTROL) tablet 2.5 mg  2.5 mg Oral BID AC Clapacs, [name], MD   2.5 mg at 12/28/17 0819  . hydrOXYzine (ATARAX/VISTARIL) tablet 25 mg  25 mg Oral TID PRN Clapacs, [name], MD   25 mg at 12/27/17 2122  . insulin aspart (novoLOG) injection 0-15 Units  0-15 Units Subcutaneous TID WC Henreitta Leber, MD   3 Units at 12/28/17 1145  . insulin aspart (novoLOG) injection 0-5 Units  0-5 Units Subcutaneous QHS Henreitta Leber, MD   3 Units at 12/27/17 2124  . insulin  aspart (novoLOG) injection 8 Units  8 Units Subcutaneous TID WC McNew, [name], MD   8 Units at 12/28/17 1146  . insulin glargine (LANTUS) injection 22 Units  22 Units Subcutaneous Daily Demetrios Loll, MD   22 Units at 12/28/17 0940  . magnesium hydroxide (MILK OF MAGNESIA) suspension 30 mL  30 mL Oral Daily PRN Clapacs, [name], MD      . metFORMIN (GLUCOPHAGE) tablet 500 mg  500 mg Oral BID WC Clapacs, [name], MD   500 mg at 12/28/17 0820  . multivitamin with minerals tablet 1 tablet  1 tablet Oral Daily Clapacs, [name], MD   1 tablet at 12/28/17 0819  . nicotine (NICODERM CQ - dosed in mg/24 hours) patch 21 mg  21 mg Transdermal Daily He, [name], MD   21 mg at 12/28/17 0818  . nicotine polacrilex (NICORETTE) gum 2 mg  2 mg Oral Q4H while awake He, [name], MD   2 mg at 12/28/17 0940  . pneumococcal 23 valent vaccine (PNU-IMMUNE) injection 0.5 mL  0.5 mL Intramuscular Tomorrow-1000 McNew, [name], MD      . QUEtiapine (SEROQUEL) tablet 50 mg  50 mg Oral QHS McNew, [name], MD      . thiamine (  VITAMIN B-1) tablet 100 mg  100 mg Oral Daily Clapacs, [name], MD   100 mg at 12/28/17 0818   Or  . thiamine (B-1) injection 100 mg  100 mg Intravenous Daily Clapacs, [name], MD      . traZODone (DESYREL) tablet 100 mg  100 mg Oral QHS PRN McNew, [name], MD       PTA Medications: Medications Prior to Admission  Medication Sig Dispense Refill Last Dose  . albuterol (PROVENTIL HFA;VENTOLIN HFA) 108 (90 Base) MCG/ACT inhaler Inhale 2 puffs into the lungs every 6 (six) hours as needed for wheezing or shortness of breath. 1 Inhaler 2 PRN at PRN  . ARIPiprazole (ABILIFY) 5 MG tablet Take 1 tablet (5 mg total) by mouth daily. (Patient not taking: Reported on 09/20/2017) 30 tablet 2 Not Taking at Unknown time  . cyclobenzaprine (FLEXERIL) 5 MG tablet Take 1 tablet (5 mg total) by mouth 3 (three) times daily as needed. 15 tablet 0   . glipiZIDE (GLUCOTROL) 5 MG tablet Take 0.5 tablets (2.5 mg total) by mouth 2 (two) times  daily. 30 tablet 1   . ketorolac (TORADOL) 10 MG tablet Take 1 tablet (10 mg total) by mouth every 8 (eight) hours. 15 tablet 0   . metFORMIN (GLUCOPHAGE) 500 MG tablet Take 1 tablet (500 mg total) by mouth 2 (two) times daily with a meal. 60 tablet 11 09/20/2017 at Unknown time  . ondansetron (ZOFRAN ODT) 4 MG disintegrating tablet Take 1 tablet (4 mg total) by mouth every 8 (eight) hours as needed for nausea or vomiting. 20 tablet 0   . traMADol (ULTRAM) 50 MG tablet Take 1 tablet (50 mg total) by mouth every 6 (six) hours as needed. 8 tablet 0     Patient Stressors: Financial difficulties Medication change or noncompliance Substance abuse  Patient Strengths: Capable of independent living General fund of knowledge Motivation for treatment/growth  Treatment Modalities: Medication Management, Group therapy, Case management,  1 to 1 session with clinician, Psychoeducation, Recreational therapy.   Physician Treatment Plan for Primary Diagnosis: Severe alcohol use disorder ([place]) Long Term Goal(s): Improvement in symptoms so as ready for discharge Improvement in symptoms so as ready for discharge   Short Term Goals: Ability to identify changes in lifestyle to reduce recurrence of condition will improve Ability to verbalize feelings will improve Ability to disclose and discuss suicidal ideas Ability to demonstrate self-control will improve Ability to identify and develop effective coping behaviors will improve Ability to maintain clinical measurements within normal limits will improve Compliance with prescribed medications will improve Ability to identify triggers associated with substance abuse/mental health issues will improve Ability to identify changes in lifestyle to reduce recurrence of condition will improve Ability to verbalize feelings will improve Ability to disclose and discuss suicidal ideas Ability to demonstrate self-control will improve Ability to identify and develop  effective coping behaviors will improve Ability to maintain clinical measurements within normal limits will improve Compliance with prescribed medications will improve Ability to identify triggers associated with substance abuse/mental health issues will improve  Medication Management: Evaluate patient's response, side effects, and tolerance of medication regimen.  Therapeutic Interventions: 1 to 1 sessions, Unit Group sessions and Medication administration.  Evaluation of Outcomes: Progressing  Physician Treatment Plan for Secondary Diagnosis: Principal Problem:   Severe alcohol use disorder ([facility]) Active Problems:   Bipolar 2 disorder ([place])   Diabetes mellitus without complication ([place])  Long Term Goal(s): Improvement in symptoms so as ready for discharge  Improvement in symptoms so as ready for discharge   Short Term Goals: Ability to identify changes in lifestyle to reduce recurrence of condition will improve Ability to verbalize feelings will improve Ability to disclose and discuss suicidal ideas Ability to demonstrate self-control will improve Ability to identify and develop effective coping behaviors will improve Ability to maintain clinical measurements within normal limits will improve Compliance with prescribed medications will improve Ability to identify triggers associated with substance abuse/mental health issues will improve Ability to identify changes in lifestyle to reduce recurrence of condition will improve Ability to verbalize feelings will improve Ability to disclose and discuss suicidal ideas Ability to demonstrate self-control will improve Ability to identify and develop effective coping behaviors will improve Ability to maintain clinical measurements within normal limits will improve Compliance with prescribed medications will improve Ability to identify triggers associated with substance abuse/mental health issues will improve     Medication Management:  Evaluate patient's response, side effects, and tolerance of medication regimen.  Therapeutic Interventions: 1 to 1 sessions, Unit Group sessions and Medication administration.  Evaluation of Outcomes: Progressing   RN Treatment Plan for Primary Diagnosis: Severe alcohol use disorder ([place]) Long Term Goal(s): Knowledge of disease and therapeutic regimen to maintain health will improve  Short Term Goals: Ability to verbalize feelings will improve, Ability to identify and develop effective coping behaviors will improve and Compliance with prescribed medications will improve  Medication Management: RN will administer medications as ordered by provider, will assess and evaluate patient's response and provide education to patient for prescribed medication. RN will report any adverse and/or side effects to prescribing provider.  Therapeutic Interventions: 1 on 1 counseling sessions, Psychoeducation, Medication administration, Evaluate responses to treatment, Monitor vital signs and CBGs as ordered, Perform/monitor CIWA, COWS, AIMS and Fall Risk screenings as ordered, Perform wound care treatments as ordered.  Evaluation of Outcomes: Progressing   LCSW Treatment Plan for Primary Diagnosis: Severe alcohol use disorder ([place]) Long Term Goal(s): Safe transition to appropriate next level of care at discharge, Engage patient in therapeutic group addressing interpersonal concerns.  Short Term Goals: Engage patient in aftercare planning with referrals and resources, Increase ability to appropriately verbalize feelings, Increase emotional regulation, Facilitate acceptance of mental health diagnosis and concerns, Facilitate patient progression through stages of change regarding substance use diagnoses and concerns, Identify triggers associated with mental health/substance abuse issues and Increase skills for wellness and recovery  Therapeutic Interventions: Assess for all discharge needs, 1 to 1 time with  Social worker, Explore available resources and support systems, Assess for adequacy in community support network, Educate family and significant other(s) on suicide prevention, Complete Psychosocial Assessment, Interpersonal group therapy.  Evaluation of Outcomes: Progressing   Progress in Treatment: Attending groups: No. Participating in groups: No. Taking medication as prescribed: Yes. Toleration medication: Yes. Family/Significant other contact made: No, will contact:  Gillermo Murdoch, son Patient understands diagnosis: Yes. Discussing patient identified problems/goals with staff: Yes. Medical problems stabilized or resolved: Yes. Denies suicidal/homicidal ideation: Yes. Issues/concerns per patient self-inventory: No Other: NA  New problem(s) identified: No, Describe:  None reported  New Short Term/Long Term Goal(s):"To stay clean and sober"  Patient Goals:  "To stay clean and sober"  Discharge Plan or Barriers: Pt will return home and follow up with outpatient treatment  Reason for Continuation of Hospitalization: Medication stabilization  Estimated Length of Stay:3-5 days  Recreational Therapy: Patient Stressors: N/A Patient Goal: Patient will identify 3 triggers for substance use within 5 recreation therapy group sessions  Attendees: Patient:Icy Rutledge 12/28/2017 1:51 PM  Physician: Amador Cunas, MD 12/28/2017 1:51 PM  Nursing: Eugenio Hoes, RN 12/28/2017 1:51 PM  RN Care Manager: 12/28/2017 1:51 PM  Social Worker: Sanjuana Kava, LCSW 12/28/2017 1:51 PM  Recreational Therapist: Isaias Sakai. Nitisha Civello CTRS, LRT 12/28/2017 1:51 PM  Other: Marney Doctor, Chaplain 12/28/2017 1:51 PM  Other:  12/28/2017 1:51 PM  Other: 12/28/2017 1:51 PM    Scribe for Treatment Team: Yvette Rack, LCSW 12/28/2017 1:51 PM

## 2017-12-28 NOTE — Progress Notes (Signed)
   12/28/17 1030  Clinical Encounter Type  Visited With Patient;Health care provider  Visit Type Initial;Spiritual support;Behavioral Health  Referral From Social work  Consult/Referral To Chaplain  Spiritual Encounters  Spiritual Needs Emotional;Other (Comment)   [place] attended the patient's treatment team meeting. The patient is having problems sleeping and requests long term care to help with substance abuse issues. Ms. Basilio stated that she can't do this alone. She has tried to do things her way now she wants help.

## 2017-12-28 NOTE — Plan of Care (Signed)
Patient endorses depression and insomnia. However, patient observed by this writer sleeping throughout the shift. BS monitored and medicated according to sliding scale. Frequents the nursing station with several requests. Patient seen by diabetes coordinator, gyn and hospitalist today. Denies SI/HI/AVH and pain. Milieu remains safe with q 15 minute safety checks.

## 2017-12-28 NOTE — Progress Notes (Addendum)
Inpatient Diabetes Program Recommendations  AACE/ADA: New Consensus Statement on Inpatient Glycemic Control (2015)  Target Ranges:  Prepandial:   less than 140 mg/dL      Peak postprandial:   less than 180 mg/dL (1-2 hours)      Critically ill patients:  140 - 180 mg/dL   Results for Julie Lambert, Julie Lambert (MRN [id number]) as of 12/28/2017 08:16  Ref. Range 12/27/2017 07:04 12/27/2017 10:59 12/27/2017 16:09 12/27/2017 20:45  Glucose-Capillary Latest Ref Range: 70 - 99 mg/dL 358 (H)  15 units NOVOLOG +  10 units LANTUS at 8am  225 (H)  9 units NOVOLOG +  7 units LANTUS at 11am  226 (H)  9 units NOVOLOG  272 (H)  3 units NOVOLOG    Results for Julie Lambert, Julie Lambert (MRN [id number]) as of 12/28/2017 08:16  Ref. Range 12/28/2017 06:47  Glucose-Capillary Latest Ref Range: 70 - 99 mg/dL 281 (H)  12 units NOVOLOG    Results for Julie Lambert, Julie Lambert (MRN [id number]) as of 12/28/2017 08:16  Ref. Range 12/26/2017 06:19  Hemoglobin A1C Latest Ref Range: 4.8 - 5.6 % 12.5 (H)  (312 mg/dl)     Home DM Meds: Glipizide 2.5 mg BID       Metformin 500 mg BID       Ran out of DM meds about 1 week prior to admission  Current Orders: Lantus 22 units Daily      Novolog Moderate Correction Scale/ SSI (0-15 units) TID AC + HS      Novolog 4 units TID with meals      Glipizide 2.5 mg BID      Metformin 500 mg BID               Note Lantus increased again today to 22 units Daily.  Agree with upward adjustment as fasting CBG this AM still quite elevated (281 mg/dl).  Also note Novolog 4 units TID (Meal Coverage) started yesterday at 12pm.  It looks like she may need more Novolog Meal Coverage as well.     MD- Please consider increasing Novolog Meal Coverage to :  Novolog 8 units TID with meals  Not sure patient will be able to afford Lantus and Novolog at time of d/c.  Have placed Care management consult to assist with meds for after d/c.  It may be easier and  definitely more affordable to convert patient to 70/30 insulin at time of discharge.  Patient can get 70/30 Insulin pens at [facility] (5 pens of 70/30 Insulin = $42.88).  This will also only require patient to take 2 injections per day as well.      --Will follow patient during hospitalization--  Wyn Quaker RN, MSN, CDE Diabetes Coordinator Inpatient Glycemic Control Team Team Pager: [phone] (8a-5p)

## 2017-12-28 NOTE — Progress Notes (Signed)
Recreation Therapy Notes  INPATIENT RECREATION THERAPY ASSESSMENT  Patient Details Name: Julie Lambert MRN: [id number] DOB: [birth year] Today's Date: 12/28/2017       Information Obtained From: Patient  Able to Participate in Assessment/Interview: Yes  Patient Presentation: Responsive  Reason for Admission (Per Patient): Patient Unable to Identify  Patient Stressors:    Coping Skills:   Other (Comment)(decorating, shopping)  Leisure Interests (2+):  Brewing technologist, Social - Family  Frequency of Recreation/Participation: Biomedical engineer of Community Resources:  Yes  Community Resources:  [employer]  Current Use:    If no, Barriers?:    Expressed Interest in [place] of Residence:  Insurance underwriter  Patient Main Form of Transportation: Musician  Patient Strengths:  I have a big heart  Patient Identified Areas of Improvement:  To get better  Patient Goal for Hospitalization:  Learn some coping skills and get pass these cravings  Current SI (including self-harm):  No  Current HI:  No  Current AVH: No  Staff Intervention Plan: Group Attendance, Collaborate with Interdisciplinary Treatment Team  Consent to Intern Participation: N/A  Lorin Gawron 12/28/2017, 2:08 PM

## 2017-12-28 NOTE — Progress Notes (Signed)
MD-   Not sure patient will be able to afford Lantus and Novolog at time of d/c.  Have placed Care management consult to assist with meds for after d/c.  It may be easier and definitely more affordable to convert patient to 70/30 insulin at time of discharge.  Patient can get 70/30 Insulin pens at [facility] (5 pens of 70/30 Insulin = $42.88).  This will also only require patient to take 2 injections per day as well.   We may want to go ahead and convert patient to 70/30 Insulin in the next few days prior to discharge to assess how much 70/30 Insulin she will require.  Based on current Insulin requirements, would start with 70/30 Insulin 20 units BID with meals  This would provide patient with 28 units basal insulin (split into two doses)  and 6 units quick insulin at meals  If you convert to 70/30 Insulin tomorrow AM, please stop Lantus and Novolog Meal Coverage.  Can continue Novolog SSI.    Met with patient today.  Spoke with patient about her current A1c of 12.5%.  Explained what an A1c is and what it measures.  Reminded patient that her goal A1c is 7% or less per ADA standards to prevent both acute and long-term complications.  Explained to patient the extreme importance of good glucose control at home.  Encouraged patient to check her CBGs at least bid at home (before breakfast and dinner) and to record all CBGs in a logbook for her PCP to review.  Patient told me she would like to go to a 30 day outpatient Rehab program after d/c form the hospital.  Is open and amenable to taking insulin as an outpatient if needed and wants to start taking better care of herself and her diabetes.  Discussed checking CBGs, taking insulin at home, Signs and Symptoms of Hypoglycemia and how to treat.  Gave patient educational pamphlet on how to control CBGs at home.  Also gave patient flyer on affordable insulin and supplies at [facility].  Educated patient insulin pen use at home.  Reviewed all steps of insulin pen  including attachment of needle, 2-unit air shot, dialing up dose, giving injection, rotation of injection sites, removing needle, disposal of sharps, storage of unused insulin, disposal of insulin etc.  Patient able to provide successful return demonstration.  Reviewed troubleshooting with insulin pen.  Also reviewed Signs/Symptoms of Hypoglycemia with patient and how to treat Hypoglycemia at home.  Have asked RNs caring for patient to please allow patient to give all injections here in hospital as much as possible for practice.  MD to give patient Rxs for insulin pens and insulin pen needles.      --Will follow patient during hospitalization--  Wyn Quaker RN, MSN, CDE Diabetes Coordinator Inpatient Glycemic Control Team Team Pager: [phone] (8a-5p)

## 2017-12-28 NOTE — Progress Notes (Signed)
[facility] MD Progress Note  12/28/2017 2:05 PM Julie Lambert  MRN:  [id number] Subjective:  Pt states taht she feels horrible today. She feels nauseated and unable to sleep. Seh states that she feels very depressed and guilty for her long history of drug abuse. She states that she has been using alcohol and crack cocaine heavily for many years. She had some sobriety int he 90s but not since then. She states that she has never really focused on herself and has done everything for everyone else. Her mood has begun to spiral down and now feels extremely hopeless and helpless. She reports passive SI. She is very tearful and sobbing during interview. Seh desperately wants to go to residential treatment to get help with substance abuse issues. She did not sleep well last night. She did eat some breakfast.   Principal Problem: Severe alcohol use disorder (HCC) Diagnosis: Principal Problem:   Severe alcohol use disorder (HCC) Active Problems:   Bipolar 2 disorder ([place])   Diabetes mellitus without complication ([place])  Total Time spent with patient: 30 minutes  Past Psychiatric History: See h&P  Past Medical History:  Past Medical History:  Diagnosis Date  . Bipolar 2 disorder ([place])   . Diabetes mellitus without complication ([place])   . Diverticulitis   . Diverticulosis   . Uterine fibroid     Past Surgical History:  Procedure Laterality Date  . BACK SURGERY    . CHOLECYSTECTOMY    . LUMBAR SPINE SURGERY     Family History:  Family History  Problem Relation Age of Onset  . CAD Mother   . Diabetes Father    Family Psychiatric  History: See H&P Social History:  Social History   Substance and Sexual Activity  Alcohol Use Yes   Comment: last use 12/03/2017     Social History   Substance and Sexual Activity  Drug Use Yes  . Types: Cocaine, Marijuana    Social History   Socioeconomic History  . Marital status: Divorced    Spouse name: Not on file  . Number of children: Not  on file  . Years of education: Not on file  . Highest education level: Not on file  Occupational History  . Not on file  Social Needs  . Financial resource strain: Not on file  . Food insecurity:    Worry: Not on file    Inability: Not on file  . Transportation needs:    Medical: Not on file    Non-medical: Not on file  Tobacco Use  . Smoking status: Current Every Day Smoker    Packs/day: 0.50    Years: 35.00    Pack years: 17.50    Types: Cigarettes  . Smokeless tobacco: Never Used  Substance and Sexual Activity  . Alcohol use: Yes    Comment: last use 12/03/2017  . Drug use: Yes    Types: Cocaine, Marijuana  . Sexual activity: Yes  Lifestyle  . Physical activity:    Days per week: Not on file    Minutes per session: Not on file  . Stress: Not on file  Relationships  . Social connections:    Talks on phone: Not on file    Gets together: Not on file    Attends religious service: Not on file    Active member of club or organization: Not on file    Attends meetings of clubs or organizations: Not on file    Relationship status: Not on file  Other Topics Concern  . Not on file  Social History Narrative  . Not on file   Additional Social History:                         Sleep: Poor  Appetite:  Poor  Current Medications: Current Facility-Administered Medications  Medication Dose Route Frequency Provider Last Rate Last Dose  . acetaminophen (TYLENOL) tablet 650 mg  650 mg Oral Q6H PRN Clapacs, [name], MD   650 mg at 12/28/17 0646  . albuterol (PROVENTIL HFA;VENTOLIN HFA) 108 (90 Base) MCG/ACT inhaler 2 puff  2 puff Inhalation Q6H PRN Clapacs, [name], MD      . alum & mag hydroxide-simeth (MAALOX/MYLANTA) 200-200-20 MG/5ML suspension 30 mL  30 mL Oral Q4H PRN Clapacs, [name], MD      . chlordiazePOXIDE (LIBRIUM) capsule 25 mg  25 mg Oral TID Marylin Crosby, MD   25 mg at 12/28/17 1145  . [START ON 12/29/2017] chlordiazePOXIDE (LIBRIUM) capsule 25 mg  25 mg  Oral BID Tyrell Brereton, [name], MD      . Derrill Memo ON 12/30/2017] chlordiazePOXIDE (LIBRIUM) capsule 25 mg  25 mg Oral Once Gwynevere Lizana, [name], MD      . chlordiazePOXIDE (LIBRIUM) capsule 50 mg  50 mg Oral QID PRN He, [name], MD      . clotrimazole (GYNE-LOTRIMIN) vaginal cream 1 Applicatorful  1 Applicatorful Vaginal QHS Ward, Chelsea C, MD      . clotrimazole (LOTRIMIN) 1 % cream   Topical BID Ward, Chelsea C, MD      . fluconazole (DIFLUCAN) tablet 150 mg  150 mg Oral Daily Ward, Chelsea C, MD      . folic acid (FOLVITE) tablet 1 mg  1 mg Oral Daily Clapacs, [name], MD   1 mg at 12/28/17 0819  . gabapentin (NEURONTIN) capsule 300 mg  300 mg Oral BID He, [name], MD   300 mg at 12/28/17 0818  . glipiZIDE (GLUCOTROL) tablet 2.5 mg  2.5 mg Oral BID AC Clapacs, [name], MD   2.5 mg at 12/28/17 0819  . hydrOXYzine (ATARAX/VISTARIL) tablet 25 mg  25 mg Oral TID PRN Clapacs, [name], MD   25 mg at 12/27/17 2122  . insulin aspart (novoLOG) injection 0-15 Units  0-15 Units Subcutaneous TID WC Henreitta Leber, MD   3 Units at 12/28/17 1145  . insulin aspart (novoLOG) injection 0-5 Units  0-5 Units Subcutaneous QHS Henreitta Leber, MD   3 Units at 12/27/17 2124  . insulin aspart (novoLOG) injection 8 Units  8 Units Subcutaneous TID WC Nikiya Starn, [name], MD   8 Units at 12/28/17 1146  . insulin glargine (LANTUS) injection 22 Units  22 Units Subcutaneous Daily Demetrios Loll, MD   22 Units at 12/28/17 0940  . magnesium hydroxide (MILK OF MAGNESIA) suspension 30 mL  30 mL Oral Daily PRN Clapacs, [name], MD      . metFORMIN (GLUCOPHAGE) tablet 500 mg  500 mg Oral BID WC Clapacs, [name], MD   500 mg at 12/28/17 0820  . multivitamin with minerals tablet 1 tablet  1 tablet Oral Daily Clapacs, [name], MD   1 tablet at 12/28/17 0819  . nicotine (NICODERM CQ - dosed in mg/24 hours) patch 21 mg  21 mg Transdermal Daily He, [name], MD   21 mg at 12/28/17 0818  . nicotine polacrilex (NICORETTE) gum 2 mg  2 mg Oral Q4H while  awake He, [name], MD   2  mg at 12/28/17 0940  . pneumococcal 23 valent vaccine (PNU-IMMUNE) injection 0.5 mL  0.5 mL Intramuscular Tomorrow-1000 Darrnell Mangiaracina R, MD      . QUEtiapine (SEROQUEL) tablet 50 mg  50 mg Oral QHS Denika Krone, [name], MD      . thiamine (VITAMIN B-1) tablet 100 mg  100 mg Oral Daily Clapacs, [name], MD   100 mg at 12/28/17 0818   Or  . thiamine (B-1) injection 100 mg  100 mg Intravenous Daily Clapacs, [name], MD      . traZODone (DESYREL) tablet 100 mg  100 mg Oral QHS PRN Valyncia Wiens, [name], MD        Lab Results:  Results for orders placed or performed during the hospital encounter of 12/25/17 (from the past 48 hour(s))  Glucose, capillary     Status: Abnormal   Collection Time: 12/26/17  4:26 PM  Result Value Ref Range   Glucose-Capillary 259 (H) 70 - 99 mg/dL  Glucose, capillary     Status: Abnormal   Collection Time: 12/26/17  8:49 PM  Result Value Ref Range   Glucose-Capillary 207 (H) 70 - 99 mg/dL   Comment 1 Notify RN   Glucose, capillary     Status: Abnormal   Collection Time: 12/27/17  7:04 AM  Result Value Ref Range   Glucose-Capillary 358 (H) 70 - 99 mg/dL   Comment 1 Notify RN   Glucose, capillary     Status: Abnormal   Collection Time: 12/27/17 10:59 AM  Result Value Ref Range   Glucose-Capillary 225 (H) 70 - 99 mg/dL  Glucose, capillary     Status: Abnormal   Collection Time: 12/27/17  4:09 PM  Result Value Ref Range   Glucose-Capillary 226 (H) 70 - 99 mg/dL  Glucose, capillary     Status: Abnormal   Collection Time: 12/27/17  8:45 PM  Result Value Ref Range   Glucose-Capillary 272 (H) 70 - 99 mg/dL   Comment 1 Notify RN   Glucose, capillary     Status: Abnormal   Collection Time: 12/28/17  6:47 AM  Result Value Ref Range   Glucose-Capillary 281 (H) 70 - 99 mg/dL   Comment 1 Notify RN   Glucose, capillary     Status: Abnormal   Collection Time: 12/28/17 11:41 AM  Result Value Ref Range   Glucose-Capillary 161 (H) 70 - 99 mg/dL    Blood Alcohol level:  Lab  Results  Component Value Date   ETH <10 36/14/4315    Metabolic Disorder Labs: Lab Results  Component Value Date   HGBA1C 12.5 (H) 12/26/2017   MPG 312.05 12/26/2017   No results found for: PROLACTIN Lab Results  Component Value Date   CHOL 215 (H) 12/26/2017   TRIG 179 (H) 12/26/2017   HDL 65 12/26/2017   CHOLHDL 3.3 12/26/2017   VLDL 36 12/26/2017   LDLCALC 114 (H) 12/26/2017   LDLCALC 79 10/09/2011    Physical Findings: AIMS: Facial and Oral Movements Muscles of Facial Expression: None, normal Lips and Perioral Area: None, normal Jaw: None, normal Tongue: None, normal,Extremity Movements Upper (arms, wrists, hands, fingers): None, normal Lower (legs, knees, ankles, toes): None, normal, Trunk Movements Neck, shoulders, hips: None, normal, Overall Severity Severity of abnormal movements (highest score from questions above): None, normal Incapacitation due to abnormal movements: None, normal Patient's awareness of abnormal movements (rate only patient's report): No Awareness, Dental Status Current problems with teeth  and/or dentures?: No Does patient usually wear dentures?: No  CIWA:  CIWA-Ar Total: 3 COWS:  COWS Total Score: 8  Musculoskeletal: Strength & Muscle Tone: within normal limits Gait & Station: normal Patient leans: N/A  Psychiatric Specialty Exam: Physical Exam  Nursing note and vitals reviewed.   Review of Systems  Gastrointestinal: Positive for nausea.  Genitourinary: Positive for flank pain.       Vaginal discharge, pain  All other systems reviewed and are negative.   Blood pressure 122/87, pulse 98, temperature 98.2 F (36.8 C), resp. rate 16, height 5\' 8"  (1.727 m), weight 68.4 kg, SpO2 100 %.Body mass index is 22.91 kg/m.  General Appearance: Disheveled  Eye Contact:  Minimal  Speech:  Clear and Coherent  Volume:  Normal  Mood:  Depressed  Affect:  Tearful  Thought Process:  Coherent and Goal Directed  Orientation:  Full (Time, Place,  and Person)  Thought Content:  Logical  Suicidal Thoughts:  Yes.  without intent/plan  Homicidal Thoughts:  No  Memory:  Immediate;   Fair  Judgement:  Fair  Insight:  Fair  Psychomotor Activity:  Normal  Concentration:  Concentration: Fair  Recall:  [employer] of Knowledge:  Fair  Language:  Fair  Akathisia:  No      Assets:  Resilience  ADL's:  Intact  Cognition:  WNL  Sleep:  Number of Hours: 7.45     Treatment Plan Summary: 52 yo female admitted due to severe depression and polysubstance abuse. She continues to have alcohol w/d symptoms including nausea and tremors. She has poor sleep and racing thoughts. She would like to go to a residential treatment program.   Plan:  Depression -Start Seroquel 50 mg qhs for mood stabilization, racing thoughts, insomnia  Alcohol w/d -Librium scheduled taper -Continue Gabapentin 300 mg BID  Alcohol and crack cocaine use disorder -[facility]AT[facility] referral  DM -Diabetes care manager on board -Metformin 500 mg BID -Lantus 22 units daily -Aspart 8 units TID and ss  Yeast infection and abdominal pain -Ob/gyn consulted. Appreciate recommendations -Started Diflucan -Checking UA  [place], MD 12/28/2017, 2:05 PM

## 2017-12-28 NOTE — Consult Note (Addendum)
Consult History and Physical   SERVICE: Gynecology   Patient Name: Julie Lambert Patient MRN:   [id number]  CC: vulvar/vaginal irritation, pelvic pain  HPI: Julie Lambert is a 52 y.o. who was seen in the behavioral health department at the request of consulting physician.  Location: vulva Onset/timing: a week ago Duration: a week Quality: itching, redness, raw Severity: mild to moderate Aggravating or alleviating conditions:  Nothing has made better or worse. Associated signs/symptoms: no abnormal smell or discharge, some diffuse low pelvic pain. Context: patient is admitted for suicide prevention, has uncontrolled diabetes, and was treated in the last few weeks for PID and possibly trichomonas.  She was given a shot, doxycycline, and states she could not afford some of the medication.   She thinks she is menopausal, with cessation of full periods in January 2018, but some intermittent bleeding in subsequent months.    She was seen in the ED in August 2019 for vaginal bleeding and diffuse pelvic pain.  Ultrasound then showed small fibroids and a 53mm stripe with no focal abnormalities.  Right ovary was not seen, left ovary was small and neither adnexa had any evidence of a mass.    Review of Systems: positives in bold GEN:   fevers, chills, weight changes, appetite changes, fatigue, night sweats HEENT:  HA, vision changes, hearing loss, congestion, rhinorrhea, sinus pressure, dysphagia CV:   CP, palpitations PULM:  SOB, cough GI:  abd pain, N/V/D/C GU:  dysuria, urgency, frequency MSK:  arthralgias, myalgias, back pain, swelling SKIN:  rashes, color changes, pallor NEURO:  numbness, weakness, tingling, seizures, dizziness, tremors PSYCH:  depression, anxiety, behavioral problems, confusion  HEME/LYMPH:  easy bruising or bleeding ENDO:  heat/cold intolerance  Past Obstetrical History: OB History   None     Past Gynecologic History: No LMP recorded.  Patient is perimenopausal. has intermittent periods,  Last one was in October, but she has gone many months between them in the last 2 years.  Past Medical History: Past Medical History:  Diagnosis Date  . Bipolar 2 disorder ([place])   . Diabetes mellitus without complication ([place])   . Diverticulitis   . Diverticulosis   . Uterine fibroid     Past Surgical History:   Past Surgical History:  Procedure Laterality Date  . BACK SURGERY    . CHOLECYSTECTOMY    . LUMBAR SPINE SURGERY      Family History:  family history includes CAD in her mother; Diabetes in her father.  Social History:  Social History   Socioeconomic History  . Marital status: Divorced    Spouse name: Not on file  . Number of children: Not on file  . Years of education: Not on file  . Highest education level: Not on file  Occupational History  . Not on file  Social Needs  . Financial resource strain: Not on file  . Food insecurity:    Worry: Not on file    Inability: Not on file  . Transportation needs:    Medical: Not on file    Non-medical: Not on file  Tobacco Use  . Smoking status: Current Every Day Smoker    Packs/day: 0.50    Years: 35.00    Pack years: 17.50    Types: Cigarettes  . Smokeless tobacco: Never Used  Substance and Sexual Activity  . Alcohol use: Yes    Comment: last use 12/03/2017  . Drug use: Yes    Types: Cocaine, Marijuana  .  Sexual activity: Yes  Lifestyle  . Physical activity:    Days per week: Not on file    Minutes per session: Not on file  . Stress: Not on file  Relationships  . Social connections:    Talks on phone: Not on file    Gets together: Not on file    Attends religious service: Not on file    Active member of club or organization: Not on file    Attends meetings of clubs or organizations: Not on file    Relationship status: Not on file  . Intimate partner violence:    Fear of current or ex partner: Not on file    Emotionally abused: Not on file     Physically abused: Not on file    Forced sexual activity: Not on file  Other Topics Concern  . Not on file  Social History Narrative  . Not on file    Home Medications:  Medications reconciled in EPIC  No current facility-administered medications on file prior to encounter.    Current Outpatient Medications on File Prior to Encounter  Medication Sig Dispense Refill  . albuterol (PROVENTIL HFA;VENTOLIN HFA) 108 (90 Base) MCG/ACT inhaler Inhale 2 puffs into the lungs every 6 (six) hours as needed for wheezing or shortness of breath. 1 Inhaler 2  . ARIPiprazole (ABILIFY) 5 MG tablet Take 1 tablet (5 mg total) by mouth daily. (Patient not taking: Reported on 09/20/2017) 30 tablet 2  . cyclobenzaprine (FLEXERIL) 5 MG tablet Take 1 tablet (5 mg total) by mouth 3 (three) times daily as needed. 15 tablet 0  . glipiZIDE (GLUCOTROL) 5 MG tablet Take 0.5 tablets (2.5 mg total) by mouth 2 (two) times daily. 30 tablet 1  . ketorolac (TORADOL) 10 MG tablet Take 1 tablet (10 mg total) by mouth every 8 (eight) hours. 15 tablet 0  . metFORMIN (GLUCOPHAGE) 500 MG tablet Take 1 tablet (500 mg total) by mouth 2 (two) times daily with a meal. 60 tablet 11  . ondansetron (ZOFRAN ODT) 4 MG disintegrating tablet Take 1 tablet (4 mg total) by mouth every 8 (eight) hours as needed for nausea or vomiting. 20 tablet 0  . traMADol (ULTRAM) 50 MG tablet Take 1 tablet (50 mg total) by mouth every 6 (six) hours as needed. 8 tablet 0    Allergies:  Allergies  Allergen Reactions  . Iodinated Diagnostic Agents Itching    Patient stated she had itching on her back after IV injection    Physical Exam:  Temp:  [98.2 F (36.8 C)] 98.2 F (36.8 C) (11/18 0627) Pulse Rate:  [94-107] 98 (11/18 1139) Resp:  [16] 16 (11/18 0627) BP: (122-143)/(82-97) 122/87 (11/18 1139) SpO2:  [99 %-100 %] 100 % (11/18 1139)   General Appearance:  Well developed, well nourished, no acute distress, alert and oriented, cooperative and  appears stated age 52:  Normocephalic atraumatic, extraocular movements intact, moist mucous membranes, neck supple with midline trachea and thyroid without masses Cardiovascular:  Normal S1/S2, regular rate and rhythm, no murmurs, 2+ distal pulses Pulmonary:  clear to auscultation, no wheezes, rales or rhonchi, symmetric air entry, good air exchange Abdomen:  Bowel sounds present, soft, nontender, nondistended, no abnormal masses or organomegaly, no epigastric pain Back: inspection of back is normal Extremities:  extremities normal, no tenderness, atraumatic, no cyanosis or edema Skin:  normal coloration and turgor, no rashes, no suspicious skin lesions noted  Neurologic:  Cranial nerves 2-12 grossly intact, grossly equal strength and muscle  tone, normal speech, no focal findings or movement disorder noted. Psychiatric:  Normal mood and affect, appropriate, no AH/VH Pelvic:  NEFG, no vulvar masses or lesions, erythema circumferentially and extending to the anus, normal vaginal mucosa, no vaginal bleeding or discharge, cervix without lesions or erythema, uterus, no adnexal masses appreciated, no palpable nodularity on rectovaginal exam, no pelvic organ prolapse.  +diffuse bladder tenderness, no CVA tenderness.   Labs/Studies:   Results for orders placed or performed during the hospital encounter of 12/25/17 (from the past 24 hour(s))  Glucose, capillary     Status: Abnormal   Collection Time: 12/28/17  6:47 AM  Result Value Ref Range   Glucose-Capillary 281 (H) 70 - 99 mg/dL   Comment 1 Notify RN   Glucose, capillary     Status: Abnormal   Collection Time: 12/28/17 11:41 AM  Result Value Ref Range   Glucose-Capillary 161 (H) 70 - 99 mg/dL  Urinalysis, Complete w Microscopic     Status: Abnormal   Collection Time: 12/28/17  2:23 PM  Result Value Ref Range   Color, Urine YELLOW (A) YELLOW   APPearance HAZY (A) CLEAR   Specific Gravity, Urine 1.013 1.005 - 1.030   pH 7.0 5.0 - 8.0    Glucose, UA 50 (A) NEGATIVE mg/dL   Hgb urine dipstick NEGATIVE NEGATIVE   Bilirubin Urine NEGATIVE NEGATIVE   Ketones, ur NEGATIVE NEGATIVE mg/dL   Protein, ur NEGATIVE NEGATIVE mg/dL   Nitrite NEGATIVE NEGATIVE   Leukocytes, UA NEGATIVE NEGATIVE   RBC / HPF 0-5 0 - 5 RBC/hpf   WBC, UA 0-5 0 - 5 WBC/hpf   Bacteria, UA RARE (A) NONE SEEN   Squamous Epithelial / LPF 6-10 0 - 5   Mucus PRESENT    Budding Yeast PRESENT    Amorphous Crystal PRESENT   Chlamydia/NGC rt PCR ([facility] only)     Status: None   Collection Time: 12/28/17  2:23 PM  Result Value Ref Range   Specimen source GC/Chlam ENDOCERVICAL    Chlamydia Tr NOT DETECTED NOT DETECTED   N gonorrhoeae NOT DETECTED NOT DETECTED  Wet prep, genital     Status: Abnormal   Collection Time: 12/28/17  2:23 PM  Result Value Ref Range   Yeast Wet Prep HPF POC NONE SEEN NONE SEEN   Trich, Wet Prep NONE SEEN NONE SEEN   Clue Cells Wet Prep HPF POC NONE SEEN NONE SEEN   WBC, Wet Prep HPF POC FEW (A) NONE SEEN   Sperm NONE SEEN   Glucose, capillary     Status: Abnormal   Collection Time: 12/28/17  4:11 PM  Result Value Ref Range   Glucose-Capillary 147 (H) 70 - 99 mg/dL  Glucose, capillary     Status: Abnormal   Collection Time: 12/28/17  9:18 PM  Result Value Ref Range   Glucose-Capillary 138 (H) 70 - 99 mg/dL     Assessment / Plan:   Apoorva F LANELL Califf is a 52 y.o. who presents with vaginal and vulvar irritation in addition to mental health issue, alcoholism, bipolar, with suicidal ideation.  1. External - vulva - quite likely yeast, can cause erythema and swelling as observed.  Apply clotrimazole 1% externally and internally and diflucan PO.  Can be secondary to inadequate treatment and her diabetes 2. Wet prep sent, although no trich/yeast/clue cells. Seen.  She did not have adequate treatemtn of her trich dx'd in a previous visit but it is not present today. 3. GCCT negative, unlikely  PID 4.  Psych - defer to psych.   5.  Diabetes - defer to hospitalists/endocrine 6. Ua and UCX sent due to tenderness in bladder.  Yeast grown and crystals seen in urine.  Hopefully the diflucan will take care of this.  Will repeat dose tomorrow and following day for 3 day treatment.    Thank you for the opportunity to be involved with this patient's care.  ----- Larey Days, MD Attending Obstetrician and Gynecologist [facility], Department of OB/GYN [facility]  60 mins was spent with this patient, in consultation, examination, reviewing her chart and history, coordination of care, and writing this note.  More than 50% of this time was face-to-face.

## 2017-12-28 NOTE — Progress Notes (Signed)
[facility] [place] NAME: Julie Lambert    MR#:  [id number]  DATE OF BIRTH:  1965/03/28  SUBJECTIVE:  CHIEF COMPLAINT:  Patient has no complaints.  Blood glucose was 281 this morning.  Increased Lantus to 22 unit daily.  Last blood glucose before lunch decreased to 161. REVIEW OF SYSTEMS:  CONSTITUTIONAL: No fever, fatigue or weakness.  EYES: No blurred or double vision.  EARS, NOSE, AND THROAT: No tinnitus or ear pain.  RESPIRATORY: No cough, shortness of breath, wheezing or hemoptysis.  CARDIOVASCULAR: No chest pain, orthopnea, edema.  GASTROINTESTINAL: No nausea, vomiting, diarrhea or abdominal pain.  GENITOURINARY: No dysuria, hematuria.  ENDOCRINE: No polyuria, nocturia,  HEMATOLOGY: No anemia, easy bruising or bleeding SKIN: No rash or lesion. MUSCULOSKELETAL: No joint pain or arthritis.   NEUROLOGIC: No tingling, numbness, weakness.  PSYCHIATRY: No anxiety or depression.   DRUG ALLERGIES:   Allergies  Allergen Reactions  . Iodinated Diagnostic Agents Itching    Patient stated she had itching on her back after IV injection    VITALS:  Blood pressure 122/87, pulse 98, temperature 98.2 F (36.8 C), resp. rate 16, height 5\' 8"  (1.727 m), weight 68.4 kg, SpO2 100 %.  PHYSICAL EXAMINATION:  GENERAL:  52 y.o.-year-old patient lying in the bed with no acute distress.  EYES: Pupils equal, round, reactive to light and accommodation. No scleral icterus. Extraocular muscles intact.  HEENT: Head atraumatic, normocephalic. Oropharynx and nasopharynx clear.  NECK:  Supple, no jugular venous distention. No thyroid enlargement, no tenderness.  LUNGS: Normal breath sounds bilaterally, no wheezing, rales,rhonchi or crepitation. No use of accessory muscles of respiration.  CARDIOVASCULAR: S1, S2 normal. No murmurs, rubs, or gallops.  ABDOMEN: Soft, nontender, nondistended. Bowel sounds present. No organomegaly or mass.  EXTREMITIES:  No pedal edema, cyanosis, or clubbing.  NEUROLOGIC: Cranial nerves II through XII are intact. Muscle strength 5/5 in all extremities. Sensation intact. Gait not checked.  PSYCHIATRIC: The patient is alert and oriented x 3.  SKIN: No obvious rash, lesion, or ulcer.    LABORATORY PANEL:   CBC Recent Labs  Lab 12/25/17 0956  WBC 15.5*  HGB 15.2*  HCT 43.2  PLT 278   ------------------------------------------------------------------------------------------------------------------  Chemistries  Recent Labs  Lab 12/25/17 0956  NA 134*  K 3.8  CL 100  CO2 20*  GLUCOSE 258*  BUN 8  CREATININE 0.41*  CALCIUM 9.3  AST 15  ALT 18  ALKPHOS 101  BILITOT 1.0   ------------------------------------------------------------------------------------------------------------------  Cardiac Enzymes No results for input(s): TROPONINI in the last 168 hours. ------------------------------------------------------------------------------------------------------------------  RADIOLOGY:  No results found.  EKG:   Orders placed or performed during the hospital encounter of 11/25/17  . ED EKG  . ED EKG  . EKG 12-Lead  . EKG 12-Lead    ASSESSMENT AND PLAN:   52 year old female with past medical history of diabetes, bipolar disorder, history of diverticulosis who presents to the hospital due to depression/suicidal ideations and noted to have uncontrolled blood sugars.  1.    Hyperglycemia and diabetes type 2 with peripheral neuropathy Hemoglobin A1c was as high as 11.6.  Patient has never been on insulin before.  Lantus dose increased to 22 units today. continue her glipizide, metformin, adjust her sliding scale insulin to a moderate scale.  Follow blood sugars.  Continue carb controlled diet. -diabetes coordinator is following  2.  Depression/suicidal ideations-patient is admitted under behavioral medicine.  Continue further care as per psychiatry.  3.  Alcohol abuse- high risk of  alcohol withdrawal.  Continue Librium. -Continue thiamine, folate. -CIWA  4.  Tobacco abuse-c smoking cessation was counseled for 3 to 4 minutes.  Ontinue nicotine patch.  Hyperlipidemia.  Advised diet control and exercise, follow-up PCP.  All the records are reviewed and case discussed with Care Management/Social Workerr. Management plans discussed with the patient, family and they are in agreement.  CODE STATUS: fc   TOTAL TIME TAKING CARE OF THIS PATIENT: 27 minutes.    Note: This dictation was prepared with Dragon dictation along with smaller phrase technology. Any transcriptional errors that result from this process are unintentional.   Demetrios Loll M.D on 12/28/2017 at 3:41 PM  Between 7am to 6pm - Pager - [phone] After 6pm go to [email or web address] - password [email or web address] [facility]  Office  [phone]  CC: Primary care physician; Patient, No Pcp Per

## 2017-12-28 NOTE — Progress Notes (Signed)
Recreation Therapy Notes  Date: 12/28/2017  Time: 9:30 am   Location: Craft room   Behavioral response: N/A   Intervention Topic: Coping skills  Discussion/Intervention: Patient did not attend group.   Clinical Observations/Feedback:  Patient did not attend group.   Jasper Hanf LRT/CTRS        Trust Crago 12/28/2017 12:02 PM

## 2017-12-29 LAB — GLUCOSE, CAPILLARY
GLUCOSE-CAPILLARY: 161 mg/dL — AB (ref 70–99)
Glucose-Capillary: 400 mg/dL — ABNORMAL HIGH (ref 70–99)
Glucose-Capillary: 144 mg/dL — ABNORMAL HIGH (ref 70–99)
Glucose-Capillary: 89 mg/dL (ref 70–99)

## 2017-12-29 MED ORDER — KETOROLAC TROMETHAMINE 15 MG/ML IJ SOLN
15.0000 mg | Freq: Four times a day (QID) | INTRAMUSCULAR | Status: DC | PRN
  Administered 2017-12-29 – 2017-12-30 (×3): 15 mg via INTRAMUSCULAR
  Filled 2017-12-29 (×6): qty 1

## 2017-12-29 MED ORDER — FLUCONAZOLE 50 MG PO TABS
150.0000 mg | ORAL_TABLET | Freq: Every day | ORAL | Status: DC
  Administered 2017-12-29: 150 mg via ORAL
  Filled 2017-12-29 (×2): qty 1

## 2017-12-29 MED ORDER — FLUCONAZOLE 100 MG PO TABS
150.0000 mg | ORAL_TABLET | Freq: Every day | ORAL | Status: AC
  Administered 2017-12-30 – 2017-12-31 (×2): 150 mg via ORAL
  Filled 2017-12-29 (×2): qty 1.5

## 2017-12-29 MED ORDER — INSULIN ASPART PROT & ASPART (70-30 MIX) 100 UNIT/ML ~~LOC~~ SUSP
10.0000 [IU] | Freq: Two times a day (BID) | SUBCUTANEOUS | Status: DC
  Administered 2017-12-29 – 2017-12-31 (×4): 10 [IU] via SUBCUTANEOUS
  Filled 2017-12-29 (×4): qty 10

## 2017-12-29 MED ORDER — INSULIN ASPART PROT & ASPART (70-30 MIX) 100 UNIT/ML ~~LOC~~ SUSP: 10.0000 [IU] | Freq: Two times a day (BID) | SUBCUTANEOUS | Status: DC

## 2017-12-29 MED ORDER — MAGNESIUM CITRATE PO SOLN
0.5000 | Freq: Once | ORAL | Status: AC
  Administered 2017-12-29: 0.5 via ORAL
  Filled 2017-12-29: qty 296

## 2017-12-29 NOTE — Progress Notes (Signed)
[facility] [place] NAME: Julie Lambert    MR#:  [id number]  DATE OF BIRTH:  [birth year]  SUBJECTIVE:  CHIEF COMPLAINT:  Patient has has depression.  She feels down today.  Blood sugars better controlled. REVIEW OF SYSTEMS:  CONSTITUTIONAL: No fever, fatigue or weakness.  EYES: No blurred or double vision.  EARS, NOSE, AND THROAT: No tinnitus or ear pain.  RESPIRATORY: No cough, shortness of breath, wheezing or hemoptysis.  CARDIOVASCULAR: No chest pain, orthopnea, edema.  GASTROINTESTINAL: No nausea, vomiting, diarrhea or abdominal pain.  GENITOURINARY: No dysuria, hematuria.  ENDOCRINE: No polyuria, nocturia,  HEMATOLOGY: No anemia, easy bruising or bleeding SKIN: No rash or lesion. MUSCULOSKELETAL: No joint pain or arthritis.   NEUROLOGIC: No tingling, numbness, weakness.  PSYCHIATRY: No anxiety but has depression.   DRUG ALLERGIES:   Allergies  Allergen Reactions  . Iodinated Diagnostic Agents Itching    Patient stated she had itching on her back after IV injection    VITALS:  Blood pressure (!) 143/98, pulse 97, temperature (!) 97.4 F (36.3 C), temperature source Oral, resp. rate 18, height 5\' 8"  (1.727 m), weight 68.4 kg, SpO2 100 %.  PHYSICAL EXAMINATION:  GENERAL:  52 y.o.-year-old patient lying in the bed with no acute distress.  EYES: Pupils equal, round, reactive to light and accommodation. No scleral icterus. Extraocular muscles intact.  HEENT: Head atraumatic, normocephalic. Oropharynx and nasopharynx clear.  NECK:  Supple, no jugular venous distention. No thyroid enlargement, no tenderness.  LUNGS: Normal breath sounds bilaterally, no wheezing, rales,rhonchi or crepitation. No use of accessory muscles of respiration.  CARDIOVASCULAR: S1, S2 normal. No murmurs, rubs, or gallops.  ABDOMEN: Soft, nontender, nondistended. Bowel sounds present. No organomegaly or mass.  EXTREMITIES: No pedal edema, cyanosis, or  clubbing.  NEUROLOGIC: Cranial nerves II through XII are intact. Muscle strength 5/5 in all extremities. Sensation intact. Gait not checked.  PSYCHIATRIC: The patient is alert and oriented x 3.  SKIN: No obvious rash, lesion, or ulcer.    LABORATORY PANEL:   CBC Recent Labs  Lab 12/25/17 0956  WBC 15.5*  HGB 15.2*  HCT 43.2  PLT 278   ------------------------------------------------------------------------------------------------------------------  Chemistries  Recent Labs  Lab 12/25/17 0956  NA 134*  K 3.8  CL 100  CO2 20*  GLUCOSE 258*  BUN 8  CREATININE 0.41*  CALCIUM 9.3  AST 15  ALT 18  ALKPHOS 101  BILITOT 1.0   ------------------------------------------------------------------------------------------------------------------  Cardiac Enzymes No results for input(s): TROPONINI in the last 168 hours. ------------------------------------------------------------------------------------------------------------------  RADIOLOGY:  No results found.  EKG:   Orders placed or performed during the hospital encounter of 11/25/17  . ED EKG  . ED EKG  . EKG 12-Lead  . EKG 12-Lead    ASSESSMENT AND PLAN:   52 year old female with past medical history of diabetes, bipolar disorder, history of diverticulosis who presents to the hospital due to depression/suicidal ideations and noted to have uncontrolled blood sugars.  1.    Hyperglycemia and diabetes type 2 with peripheral neuropathy Hemoglobin A1c was as high as 11.6.  Patient has never been on insulin before.  Lantus dose was increased to 22 units.  Per diabetes coordinator's recommendation, changed to NovoLog 70/30 10 units twice daily, she needs NovoLog 70/30 on discharge. continue her glipizide, increased metformin to 1000 mg twice daily, adjusted her sliding scale insulin to a moderate scale.  Follow blood sugars.  Continue carb controlled diet.  2.  Depression/suicidal  ideations-patient is admitted under  behavioral medicine.  Continue further care as per psychiatry.  3.  Alcohol abuse- high risk of alcohol withdrawal.  Continue Librium. -Continue thiamine, folate. -CIWA  4.  Tobacco abuse-c smoking cessation was counseled for 3 to 4 minutes.  Ontinue nicotine patch.  Hyperlipidemia.  Advised diet control and exercise, follow-up PCP.  Medically stable.  Sign off.  All the records are reviewed and case discussed with Care Management/Social Workerr. Management plans discussed with the patient, family and they are in agreement.  CODE STATUS: fc   TOTAL TIME TAKING CARE OF THIS PATIENT: 22 minutes.    Note: This dictation was prepared with Dragon dictation along with smaller phrase technology. Any transcriptional errors that result from this process are unintentional.   Demetrios Loll M.D on 12/29/2017 at 4:24 PM  Between 7am to 6pm - Pager - [phone] After 6pm go to [email or web address] - password [email or web address] [facility]  Office  [phone]  CC: Primary care physician; Patient, No Pcp Per

## 2017-12-29 NOTE — Progress Notes (Addendum)
Received Julie Lambert this AM after breakfast, she was compliant with her medications and self administered her insulin. She continued to endorse feeling anxious, depressed  and hopelessness. She had an episode of crying, stating she feels very badly.  Her goal for today is to attend the group therapy sessions. She has been OOB in the milieu for briefs periods walking with a limp. She was medicated for pain per order with mild relief.

## 2017-12-29 NOTE — Progress Notes (Signed)
Inpatient Diabetes Program Recommendations  AACE/ADA: New Consensus Statement on Inpatient Glycemic Control (2015)  Target Ranges:  Prepandial:   less than 140 mg/dL      Peak postprandial:   less than 180 mg/dL (1-2 hours)      Critically ill patients:  140 - 180 mg/dL   Lab Results  Component Value Date   GLUCAP 400 (H) 12/29/2017   HGBA1C 12.5 (H) 12/26/2017    ReResults for [name], [name] (MRN [id number]) as of 12/29/2017 09:55  Ref. Range 12/28/2017 06:47 12/28/2017 11:41 12/28/2017 16:11 12/28/2017 21:18 12/29/2017 07:13  Glucose-Capillary Latest Ref Range: 70 - 99 mg/dL 281 (H) 161 (H) 147 (H) 138 (H) 400 (H)    Inpatient Diabetes Program Recommendations:   Consider d/c of Lantus insulin as patient may not be able to afford at discharge.  Diabetes Coordinator spoke at length with patient on 11/18. Please consider ordering 70/30 10 units BID (will provide a total of 14 units for basal and 6 units for meal coverage per day).  Thanks,  Adah Perl, RN, BC-ADM Inpatient Diabetes Coordinator Pager [phone]

## 2017-12-29 NOTE — Progress Notes (Signed)
[facility] MD Progress Note  12/29/2017 11:41 AM Julie Lambert  MRN:  [id number] Subjective:  Pt states that she does not feel well today. She still feels nauseated. She is feeling very depressed and did have suicidal thoughts th is morning. She states that she has a lot of hip pain and requests something for it. She did sleep better last night. She was out of her room more yesterday and did attend group this morning which she enjoyed. She is still motivated to go to residential treatment.   Principal Problem: Severe alcohol use disorder (HCC) Diagnosis: Principal Problem:   Severe alcohol use disorder (HCC) Active Problems:   Bipolar 2 disorder ([place])   Diabetes mellitus without complication ([place])  Total Time spent with patient: 20 minutes  Past Psychiatric History: See h&P  Past Medical History:  Past Medical History:  Diagnosis Date  . Bipolar 2 disorder ([place])   . Diabetes mellitus without complication ([place])   . Diverticulitis   . Diverticulosis   . Uterine fibroid     Past Surgical History:  Procedure Laterality Date  . BACK SURGERY    . CHOLECYSTECTOMY    . LUMBAR SPINE SURGERY     Family History:  Family History  Problem Relation Age of Onset  . CAD Mother   . Diabetes Father    Family Psychiatric  History: See H&P Social History:  Social History   Substance and Sexual Activity  Alcohol Use Yes   Comment: last use 12/03/2017     Social History   Substance and Sexual Activity  Drug Use Yes  . Types: Cocaine, Marijuana    Social History   Socioeconomic History  . Marital status: Divorced    Spouse name: Not on file  . Number of children: Not on file  . Years of education: Not on file  . Highest education level: Not on file  Occupational History  . Not on file  Social Needs  . Financial resource strain: Not on file  . Food insecurity:    Worry: Not on file    Inability: Not on file  . Transportation needs:    Medical: Not on file     Non-medical: Not on file  Tobacco Use  . Smoking status: Current Every Day Smoker    Packs/day: 0.50    Years: 35.00    Pack years: 17.50    Types: Cigarettes  . Smokeless tobacco: Never Used  Substance and Sexual Activity  . Alcohol use: Yes    Comment: last use 12/03/2017  . Drug use: Yes    Types: Cocaine, Marijuana  . Sexual activity: Yes  Lifestyle  . Physical activity:    Days per week: Not on file    Minutes per session: Not on file  . Stress: Not on file  Relationships  . Social connections:    Talks on phone: Not on file    Gets together: Not on file    Attends religious service: Not on file    Active member of club or organization: Not on file    Attends meetings of clubs or organizations: Not on file    Relationship status: Not on file  Other Topics Concern  . Not on file  Social History Narrative  . Not on file   Additional Social History:                         Sleep: Fair  Appetite:  Fair  Current Medications: Current Facility-Administered Medications  Medication Dose Route Frequency Provider Last Rate Last Dose  . acetaminophen (TYLENOL) tablet 650 mg  650 mg Oral Q6H PRN Clapacs, [name], MD   650 mg at 12/28/17 1609  . albuterol (PROVENTIL HFA;VENTOLIN HFA) 108 (90 Base) MCG/ACT inhaler 2 puff  2 puff Inhalation Q6H PRN Clapacs, [name], MD      . alum & mag hydroxide-simeth (MAALOX/MYLANTA) 200-200-20 MG/5ML suspension 30 mL  30 mL Oral Q4H PRN Clapacs, [name], MD      . chlordiazePOXIDE (LIBRIUM) capsule 25 mg  25 mg Oral BID Marylin Crosby, MD   25 mg at 12/29/17 0834  . [START ON 12/30/2017] chlordiazePOXIDE (LIBRIUM) capsule 25 mg  25 mg Oral Once McNew, [name], MD      . chlordiazePOXIDE (LIBRIUM) capsule 50 mg  50 mg Oral QID PRN He, [name], MD      . clotrimazole (GYNE-LOTRIMIN) vaginal cream 1 Applicatorful  1 Applicatorful Vaginal QHS Ward, Honor Loh, MD   1 Applicatorful at 13/24/40 [phone]  . clotrimazole (LOTRIMIN) 1 % cream   Topical  BID Ward, Chelsea C, MD      . fluconazole (DIFLUCAN) tablet 150 mg  150 mg Oral Daily Ward, Honor Loh, MD   150 mg at 12/29/17 0835  . folic acid (FOLVITE) tablet 1 mg  1 mg Oral Daily Clapacs, [name], MD   1 mg at 12/29/17 0834  . gabapentin (NEURONTIN) capsule 300 mg  300 mg Oral BID He, [name], MD   300 mg at 12/29/17 0834  . glipiZIDE (GLUCOTROL) tablet 2.5 mg  2.5 mg Oral BID AC Clapacs, [name], MD   2.5 mg at 12/29/17 0836  . hydrOXYzine (ATARAX/VISTARIL) tablet 25 mg  25 mg Oral TID PRN Clapacs, [name], MD   25 mg at 12/28/17 2121  . insulin aspart (novoLOG) injection 0-15 Units  0-15 Units Subcutaneous TID WC Henreitta Leber, MD   15 Units at 12/29/17 0716  . insulin aspart (novoLOG) injection 0-5 Units  0-5 Units Subcutaneous QHS Henreitta Leber, MD   3 Units at 12/27/17 2124  . insulin aspart (novoLOG) injection 8 Units  8 Units Subcutaneous TID WC Marylin Crosby, MD   8 Units at 12/29/17 [phone]  . insulin glargine (LANTUS) injection 22 Units  22 Units Subcutaneous Daily Demetrios Loll, MD   22 Units at 12/29/17 [phone]  . ketorolac (TORADOL) 15 MG/ML injection 15 mg  15 mg Intramuscular Q6H PRN McNew, [name], MD      . magnesium citrate solution 0.5 Bottle  0.5 Bottle Oral Once McNew, [name], MD      . magnesium hydroxide (MILK OF MAGNESIA) suspension 30 mL  30 mL Oral Daily PRN Clapacs, [name], MD      . metFORMIN (GLUCOPHAGE) tablet 1,000 mg  1,000 mg Oral BID WC Demetrios Loll, MD   1,000 mg at 12/29/17 0834  . multivitamin with minerals tablet 1 tablet  1 tablet Oral Daily Clapacs, [name], MD   1 tablet at 12/29/17 0834  . nicotine (NICODERM CQ - dosed in mg/24 hours) patch 21 mg  21 mg Transdermal Daily He, [name], MD   21 mg at 12/29/17 0839  . nicotine polacrilex (NICORETTE) gum 2 mg  2 mg Oral Q4H while awake He, [name], MD   2 mg at 12/29/17 0650  . pneumococcal 23 valent vaccine (PNU-IMMUNE) injection 0.5 mL  0.5 mL Intramuscular Tomorrow-1000 McNew, [facility]  R, MD      . polyvinyl alcohol  (LIQUIFILM TEARS) 1.4 % ophthalmic solution 2 drop  2 drop Both Eyes BID Marylin Crosby, MD   2 drop at 12/28/17 1615  . QUEtiapine (SEROQUEL) tablet 50 mg  50 mg Oral QHS McNew, [name], MD   50 mg at 12/28/17 2121  . thiamine (VITAMIN B-1) tablet 100 mg  100 mg Oral Daily Clapacs, [name], MD   100 mg at 12/29/17 6629   Or  . thiamine (B-1) injection 100 mg  100 mg Intravenous Daily Clapacs, [name], MD      . traZODone (DESYREL) tablet 100 mg  100 mg Oral QHS PRN Marylin Crosby, MD   100 mg at 12/28/17 2121    Lab Results:  Results for orders placed or performed during the hospital encounter of 12/25/17 (from the past 48 hour(s))  Glucose, capillary     Status: Abnormal   Collection Time: 12/27/17  4:09 PM  Result Value Ref Range   Glucose-Capillary 226 (H) 70 - 99 mg/dL  Glucose, capillary     Status: Abnormal   Collection Time: 12/27/17  8:45 PM  Result Value Ref Range   Glucose-Capillary 272 (H) 70 - 99 mg/dL   Comment 1 Notify RN   Glucose, capillary     Status: Abnormal   Collection Time: 12/28/17  6:47 AM  Result Value Ref Range   Glucose-Capillary 281 (H) 70 - 99 mg/dL   Comment 1 Notify RN   Glucose, capillary     Status: Abnormal   Collection Time: 12/28/17 11:41 AM  Result Value Ref Range   Glucose-Capillary 161 (H) 70 - 99 mg/dL  Urinalysis, Complete w Microscopic     Status: Abnormal   Collection Time: 12/28/17  2:23 PM  Result Value Ref Range   Color, Urine YELLOW (A) YELLOW   APPearance HAZY (A) CLEAR   Specific Gravity, Urine 1.013 1.005 - 1.030   pH 7.0 5.0 - 8.0   Glucose, UA 50 (A) NEGATIVE mg/dL   Hgb urine dipstick NEGATIVE NEGATIVE   Bilirubin Urine NEGATIVE NEGATIVE   Ketones, ur NEGATIVE NEGATIVE mg/dL   Protein, ur NEGATIVE NEGATIVE mg/dL   Nitrite NEGATIVE NEGATIVE   Leukocytes, UA NEGATIVE NEGATIVE   RBC / HPF 0-5 0 - 5 RBC/hpf   WBC, UA 0-5 0 - 5 WBC/hpf   Bacteria, UA RARE (A) NONE SEEN   Squamous Epithelial / LPF 6-10 0 - 5   Mucus PRESENT     Budding Yeast PRESENT    Amorphous Crystal PRESENT     Comment: Performed at [facility], [address] [place], [place] [address]  [place] rt PCR [facility] only)     Status: None   Collection Time: 12/28/17  2:23 PM  Result Value Ref Range   Specimen source GC/Chlam ENDOCERVICAL    Chlamydia Tr NOT DETECTED NOT DETECTED   N gonorrhoeae NOT DETECTED NOT DETECTED    Comment: (NOTE) This CT/NG assay has not been evaluated in patients with a history of  hysterectomy. Performed at [facility], [address] [place], [place] [address]   Wet prep, genital     Status: Abnormal   Collection Time: 12/28/17  2:23 PM  Result Value Ref Range   Yeast Wet Prep HPF POC NONE SEEN NONE SEEN    Comment: Swab received with less than 0.5 mL of saline, saline added to specimen, interpret results with caution.   Dalbert Batman, [employer]  Prep NONE SEEN NONE SEEN   Clue Cells Wet Prep HPF POC NONE SEEN NONE SEEN   WBC, Wet Prep HPF POC FEW (A) NONE SEEN   Sperm NONE SEEN     Comment: Performed at [facility], [address] [place], [place] [address]  Glucose, capillary     Status: Abnormal   Collection Time: 12/28/17  4:11 PM  Result Value Ref Range   Glucose-Capillary 147 (H) 70 - 99 mg/dL  Glucose, capillary     Status: Abnormal   Collection Time: 12/28/17  9:18 PM  Result Value Ref Range   Glucose-Capillary 138 (H) 70 - 99 mg/dL  Glucose, capillary     Status: Abnormal   Collection Time: 12/29/17  7:13 AM  Result Value Ref Range   Glucose-Capillary 400 (H) 70 - 99 mg/dL    Blood Alcohol level:  Lab Results  Component Value Date   ETH <10 60/45/4098    Metabolic Disorder Labs: Lab Results  Component Value Date   HGBA1C 12.5 (H) 12/26/2017   MPG 312.05 12/26/2017   No results found for: PROLACTIN Lab Results  Component Value Date   CHOL 215 (H) 12/26/2017   TRIG 179 (H) 12/26/2017   HDL 65 12/26/2017   CHOLHDL 3.3 12/26/2017   VLDL 36 12/26/2017    LDLCALC 114 (H) 12/26/2017   LDLCALC 79 10/09/2011    Physical Findings: AIMS: Facial and Oral Movements Muscles of Facial Expression: None, normal Lips and Perioral Area: None, normal Jaw: None, normal Tongue: None, normal,Extremity Movements Upper (arms, wrists, hands, fingers): None, normal Lower (legs, knees, ankles, toes): None, normal, Trunk Movements Neck, shoulders, hips: None, normal, Overall Severity Severity of abnormal movements (highest score from questions above): None, normal Incapacitation due to abnormal movements: None, normal Patient's awareness of abnormal movements (rate only patient's report): No Awareness, Dental Status Current problems with teeth and/or dentures?: No Does patient usually wear dentures?: No  CIWA:  CIWA-Ar Total: 0 COWS:  COWS Total Score: 8  Musculoskeletal: Strength & Muscle Tone: within normal limits Gait & Station: normal Patient leans: N/A  Psychiatric Specialty Exam: Physical Exam  Nursing note and vitals reviewed.   Review of Systems  All other systems reviewed and are negative.   Blood pressure (!) 143/98, pulse 97, temperature (!) 97.4 F (36.3 C), temperature source Oral, resp. rate 18, height 5\' 8"  (1.727 m), weight 68.4 kg, SpO2 100 %.Body mass index is 22.91 kg/m.  General Appearance: Disheveled  Eye Contact:  Fair  Speech:  Slow  Volume:  Decreased  Mood:  Depressed  Affect:  Tearful  Thought Process:  Coherent and Goal Directed  Orientation:  Full (Time, Place, and Person)  Thought Content:  Logical  Suicidal Thoughts:  Yes.  without intent/plan  Homicidal Thoughts:  No  Memory:  Immediate;   Fair  Judgement:  Fair  Insight:  Fair  Psychomotor Activity:  Normal  Concentration:  Concentration: Fair  Recall:  [employer] of Knowledge:  Fair  Language:  Fair  Akathisia:  No      Assets:  Resilience  ADL's:  Intact  Cognition:  WNL  Sleep:  Number of Hours: 7     Treatment Plan Summary: 52 yo female  admitted due to severe depression and polysubstance abuse. She continues to feel nauseated and tremors. Sleep is improving. She was referred to [place] yesterday.  Plan:  Depression -Continue Seroquel mg qhs for mood stabilization, racing thoughts, and insomnia  Alcohol w/d -Continue  Librium taper -Continue Gabapentin  Alcohol and crack cocaine use disorder -ADATC referral  DM -Diabetes care manager on board and spent a lot of time with er yesterday for teaching -Recommended to change to 70/30 10 unit BID so she has better chance of affording this on discharge -continue SS  Yeast infection -ob/gyn consulted and recommended diflucan -UA negative for infection  Dispo -[place], MD 12/29/2017, 11:41 AM

## 2017-12-29 NOTE — Progress Notes (Signed)
Recreation Therapy Notes  Date: 12/29/17  Time: 9:30 am  Location: Craft Room  Behavioral response: Appropriate   Intervention Topic: Goals  Discussion/Intervention:  Group content on today was focused on goals. Patients described what goals are and how they define goals. Individuals expressed how they go about setting goals and reaching them. The group identified how important goals are and if they make short term goals to reach long term goals. Patients described how many goals they work on at a time and what affects them not reaching their goal. Individuals described how much time they put into planning and obtaining their goals. The group participated in the intervention "My Goal Board" and made personal goal boards to help them achieve their goal. Clinical Observations/Feedback:  Patient came to group late due to unknown reasons. She stated a goal for herself is staying positive and not having negative self talk. Individual was social with peers and staff while participating in the intervention.  Deona Novitski LRT/CTRS        Marguerite Jarboe 12/29/2017 12:57 PM

## 2017-12-29 NOTE — BHH Group Notes (Signed)
[place] Group Notes:  (Nursing/MHT/Case Management/Adjunct)  Date:  12/29/2017  Time:  9:18 PM  Type of Therapy:  Group Therapy  Participation Level:  Active  Participation Quality:  Appropriate  Affect:  Appropriate  Cognitive:  Appropriate  Insight:  Appropriate  Engagement in Group:  Engaged  Modes of Intervention:  Discussion  Summary of Progress/Problems: Dasja stated her goal was to attend group and she was able to accomplish. Fanchon stated she got a lot out of group today.  Adalynn stated she wanted to control her diabetes as well. Naylin stated she wanted to control her cravings by looking at sheets from group. MHT reviewed rules and expectations of the unit. MHT informed patients of visiting hours and limit of visitors at one time. MHT informed patient's visitors were not allowed to bring in food and up to 5 sets of clothing. MHT encouraged patients not to give out private information and not to use last name on unit. MHT encouraged patients to attend groups throughout the day. MHT informed patients of phone hours and the limit of long-distance phone calls. MHT informed patients they would have a discharge plan with follow up directions. MHT encouraged patients to follow up with recommended treatment. MHT processed with patients about the importance of taking medications as prescribed.  Barnie Mort 12/29/2017, 9:18 PM

## 2017-12-29 NOTE — Progress Notes (Signed)
Patient is worried and depressed states that she is  unsure how to manage her diabetes insulin out side of here  when she is discharged, support and encouragement is provided and assure patient that education and practise will be provided , information given was understood and acknowledged by patient . Patient is relaxed.  ,

## 2017-12-30 LAB — GLUCOSE, CAPILLARY
Glucose-Capillary: 241 mg/dL — ABNORMAL HIGH (ref 70–99)
Glucose-Capillary: 160 mg/dL — ABNORMAL HIGH (ref 70–99)
Glucose-Capillary: 131 mg/dL — ABNORMAL HIGH (ref 70–99)
Glucose-Capillary: 92 mg/dL (ref 70–99)

## 2017-12-30 LAB — URINE CULTURE

## 2017-12-30 MED ORDER — GLIPIZIDE 5 MG PO TABS: 2.5000 mg | ORAL_TABLET | Freq: Two times a day (BID) | ORAL | 0 refills | Status: DC

## 2017-12-30 MED ORDER — INSULIN ASPART PROT & ASPART (70-30 MIX) 100 UNIT/ML ~~LOC~~ SUSP: 10.0000 [IU] | Freq: Two times a day (BID) | SUBCUTANEOUS | 0 refills | Status: DC

## 2017-12-30 MED ORDER — QUETIAPINE FUMARATE 50 MG PO TABS: 50.0000 mg | ORAL_TABLET | Freq: Every day | ORAL | Status: DC

## 2017-12-30 MED ORDER — METFORMIN HCL 1000 MG PO TABS: 1000.0000 mg | ORAL_TABLET | Freq: Two times a day (BID) | ORAL | 0 refills | Status: DC

## 2017-12-30 NOTE — Care Management (Signed)
Patient agrees to referral to [facility] and [facility].  I have emailed application for CSW to deliver to patient.  I have sent referral to [facility] and Medication [facility]. Patient anticipating discharge tomorrow.

## 2017-12-30 NOTE — Progress Notes (Signed)
[facility] MD Progress Note  12/30/2017 2:40 PM Julie Lambert  MRN:  [id number] Subjective:  Pt is out of her room more today. Affect is brighter. She states that her mood is improving and starting to feel more hopeful. She is sleeping much better. She does not want to adjust Seroquel yet. She was accepted into [facility] for tomorrow and she is very excited about this. She brightens up significantly when told about this. She is very  Motivated to get sober and get her life back on track. She denies SI or thoughts of self harm.   Principal Problem: Severe alcohol use disorder (HCC) Diagnosis: Principal Problem:   Severe alcohol use disorder (HCC) Active Problems:   Bipolar 2 disorder ([place])   Diabetes mellitus without complication ([place])  Total Time spent with patient: 20 minutes  Past Psychiatric History: See H&P  Past Medical History:  Past Medical History:  Diagnosis Date  . Bipolar 2 disorder ([place])   . Diabetes mellitus without complication ([place])   . Diverticulitis   . Diverticulosis   . Uterine fibroid     Past Surgical History:  Procedure Laterality Date  . BACK SURGERY    . CHOLECYSTECTOMY    . LUMBAR SPINE SURGERY     Family History:  Family History  Problem Relation Age of Onset  . CAD Mother   . Diabetes Father    Family Psychiatric  History: See H&P Social History:  Social History   Substance and Sexual Activity  Alcohol Use Yes   Comment: last use 12/03/2017     Social History   Substance and Sexual Activity  Drug Use Yes  . Types: Cocaine, Marijuana    Social History   Socioeconomic History  . Marital status: Divorced    Spouse name: Not on file  . Number of children: Not on file  . Years of education: Not on file  . Highest education level: Not on file  Occupational History  . Not on file  Social Needs  . Financial resource strain: Not on file  . Food insecurity:    Worry: Not on file    Inability: Not on file  . Transportation needs:   Medical: Not on file    Non-medical: Not on file  Tobacco Use  . Smoking status: Current Every Day Smoker    Packs/day: 0.50    Years: 35.00    Pack years: 17.50    Types: Cigarettes  . Smokeless tobacco: Never Used  Substance and Sexual Activity  . Alcohol use: Yes    Comment: last use 12/03/2017  . Drug use: Yes    Types: Cocaine, Marijuana  . Sexual activity: Yes  Lifestyle  . Physical activity:    Days per week: Not on file    Minutes per session: Not on file  . Stress: Not on file  Relationships  . Social connections:    Talks on phone: Not on file    Gets together: Not on file    Attends religious service: Not on file    Active member of club or organization: Not on file    Attends meetings of clubs or organizations: Not on file    Relationship status: Not on file  Other Topics Concern  . Not on file  Social History Narrative  . Not on file   Additional Social History:  Sleep: Good  Appetite:  Good  Current Medications: Current Facility-Administered Medications  Medication Dose Route Frequency Provider Last Rate Last Dose  . acetaminophen (TYLENOL) tablet 650 mg  650 mg Oral Q6H PRN Clapacs, [name], MD   650 mg at 12/28/17 1609  . albuterol (PROVENTIL HFA;VENTOLIN HFA) 108 (90 Base) MCG/ACT inhaler 2 puff  2 puff Inhalation Q6H PRN Clapacs, [name], MD      . alum & mag hydroxide-simeth (MAALOX/MYLANTA) 200-200-20 MG/5ML suspension 30 mL  30 mL Oral Q4H PRN Clapacs, [name], MD      . chlordiazePOXIDE (LIBRIUM) capsule 50 mg  50 mg Oral QID PRN He, [name], MD      . clotrimazole (GYNE-LOTRIMIN) vaginal cream 1 Applicatorful  1 Applicatorful Vaginal QHS Ward, Honor Loh, MD   1 Applicatorful at 67/12/45 2211  . clotrimazole (LOTRIMIN) 1 % cream   Topical BID Ward, Chelsea C, MD      . fluconazole (DIFLUCAN) tablet 150 mg  150 mg Oral Daily Yazan Gatling, [name], MD   150 mg at 12/30/17 0802  . folic acid (FOLVITE) tablet 1 mg  1 mg Oral Daily  Clapacs, [name], MD   1 mg at 12/30/17 0801  . gabapentin (NEURONTIN) capsule 300 mg  300 mg Oral BID He, [name], MD   300 mg at 12/30/17 0801  . glipiZIDE (GLUCOTROL) tablet 2.5 mg  2.5 mg Oral BID AC Clapacs, [name], MD   2.5 mg at 12/30/17 0802  . hydrOXYzine (ATARAX/VISTARIL) tablet 25 mg  25 mg Oral TID PRN Clapacs, [name], MD   25 mg at 12/28/17 2121  . insulin aspart (novoLOG) injection 0-15 Units  0-15 Units Subcutaneous TID WC Henreitta Leber, MD   3 Units at 12/30/17 1139  . insulin aspart (novoLOG) injection 0-5 Units  0-5 Units Subcutaneous QHS Henreitta Leber, MD   3 Units at 12/27/17 2124  . insulin aspart protamine- aspart (NOVOLOG MIX 70/30) injection 10 Units  10 Units Subcutaneous BID WC Andria Head, [name], MD   10 Units at 12/30/17 0757  . ketorolac (TORADOL) 15 MG/ML injection 15 mg  15 mg Intramuscular Q6H PRN Rhylen Shaheen, [name], MD   15 mg at 12/30/17 1105  . magnesium hydroxide (MILK OF MAGNESIA) suspension 30 mL  30 mL Oral Daily PRN Clapacs, [name], MD      . metFORMIN (GLUCOPHAGE) tablet 1,000 mg  1,000 mg Oral BID WC Demetrios Loll, MD   1,000 mg at 12/30/17 0801  . multivitamin with minerals tablet 1 tablet  1 tablet Oral Daily Clapacs, [name], MD   1 tablet at 12/30/17 0801  . nicotine (NICODERM CQ - dosed in mg/24 hours) patch 21 mg  21 mg Transdermal Daily He, [name], MD   21 mg at 12/30/17 8099  . nicotine polacrilex (NICORETTE) gum 2 mg  2 mg Oral Q4H while awake He, [name], MD   2 mg at 12/30/17 1351  . pneumococcal 23 valent vaccine (PNU-IMMUNE) injection 0.5 mL  0.5 mL Intramuscular Tomorrow-1000 Alizey Noren R, MD      . polyvinyl alcohol (LIQUIFILM TEARS) 1.4 % ophthalmic solution 2 drop  2 drop Both Eyes BID Draco Malczewski, [name], MD   2 drop at 12/30/17 0800  . QUEtiapine (SEROQUEL) tablet 50 mg  50 mg Oral QHS Nicholas Trompeter, [name], MD   50 mg at 12/29/17 2129  . thiamine (VITAMIN B-1) tablet 100 mg  100 mg Oral Daily Clapacs, [name], MD  100 mg at 12/30/17 0801   Or  . thiamine (B-1)  injection 100 mg  100 mg Intravenous Daily Clapacs, [name], MD      . traZODone (DESYREL) tablet 100 mg  100 mg Oral QHS PRN Marylin Crosby, MD   100 mg at 12/28/17 2121    Lab Results:  Results for orders placed or performed during the hospital encounter of 12/25/17 (from the past 48 hour(s))  Glucose, capillary     Status: Abnormal   Collection Time: 12/28/17  4:11 PM  Result Value Ref Range   Glucose-Capillary 147 (H) 70 - 99 mg/dL  Glucose, capillary     Status: Abnormal   Collection Time: 12/28/17  9:18 PM  Result Value Ref Range   Glucose-Capillary 138 (H) 70 - 99 mg/dL  Glucose, capillary     Status: Abnormal   Collection Time: 12/29/17  7:13 AM  Result Value Ref Range   Glucose-Capillary 400 (H) 70 - 99 mg/dL  Glucose, capillary     Status: Abnormal   Collection Time: 12/29/17 11:41 AM  Result Value Ref Range   Glucose-Capillary 161 (H) 70 - 99 mg/dL  Glucose, capillary     Status: Abnormal   Collection Time: 12/29/17  4:35 PM  Result Value Ref Range   Glucose-Capillary 144 (H) 70 - 99 mg/dL   Comment 1 Notify RN   Glucose, capillary     Status: None   Collection Time: 12/29/17  9:10 PM  Result Value Ref Range   Glucose-Capillary 89 70 - 99 mg/dL  Glucose, capillary     Status: Abnormal   Collection Time: 12/30/17  7:05 AM  Result Value Ref Range   Glucose-Capillary 241 (H) 70 - 99 mg/dL   Comment 1 Notify RN   Glucose, capillary     Status: Abnormal   Collection Time: 12/30/17 11:32 AM  Result Value Ref Range   Glucose-Capillary 160 (H) 70 - 99 mg/dL    Blood Alcohol level:  Lab Results  Component Value Date   ETH <10 73/71/0626    Metabolic Disorder Labs: Lab Results  Component Value Date   HGBA1C 12.5 (H) 12/26/2017   MPG 312.05 12/26/2017   No results found for: PROLACTIN Lab Results  Component Value Date   CHOL 215 (H) 12/26/2017   TRIG 179 (H) 12/26/2017   HDL 65 12/26/2017   CHOLHDL 3.3 12/26/2017   VLDL 36 12/26/2017   LDLCALC 114 (H)  12/26/2017   LDLCALC 79 10/09/2011    Physical Findings: AIMS: Facial and Oral Movements Muscles of Facial Expression: None, normal Lips and Perioral Area: None, normal Jaw: None, normal Tongue: None, normal,Extremity Movements Upper (arms, wrists, hands, fingers): None, normal Lower (legs, knees, ankles, toes): None, normal, Trunk Movements Neck, shoulders, hips: None, normal, Overall Severity Severity of abnormal movements (highest score from questions above): None, normal Incapacitation due to abnormal movements: None, normal Patient's awareness of abnormal movements (rate only patient's report): No Awareness, Dental Status Current problems with teeth and/or dentures?: No Does patient usually wear dentures?: No  CIWA:  CIWA-Ar Total: 2 COWS:  COWS Total Score: 8  Musculoskeletal: Strength & Muscle Tone: within normal limits Gait & Station: normal Patient leans: N/A  Psychiatric Specialty Exam: Physical Exam  Nursing note and vitals reviewed.   Review of Systems  All other systems reviewed and are negative.   Blood pressure 126/86, pulse (!) 103, temperature 97.7 F (36.5 C), temperature source Oral, resp. rate 18, height 5\' 8"  (1.727  m), weight 68.4 kg, SpO2 100 %.Body mass index is 22.91 kg/m.  General Appearance: Casual  Eye Contact:  Good  Speech:  Clear and Coherent  Volume:  Normal  Mood:  Depressed  Affect:  Congruent but brighter today  Thought Process:  Coherent and Goal Directed  Orientation:  Full (Time, Place, and Person)  Thought Content:  Logical  Suicidal Thoughts:  No  Homicidal Thoughts:  No  Memory:  Immediate;   Fair  Judgement:  Fair  Insight:  Fair  Psychomotor Activity:  Normal  Concentration:  Concentration: Poor  Recall:  [employer] of Knowledge:  Fair  Language:  Fair  Akathisia:  No      Assets:  Resilience  ADL's:  Intact  Cognition:  WNL  Sleep:  Number of Hours: 7     Treatment Plan Summary: 52 yo female admitted due to  SI and substance abuse. Mood is improving and SI has resolved> She was accepted into [facility] and is very excited about this.   Plan:  Plan:  Depression -Continue Seroquel 50 mg qhs for mood stabilization, racing thoughts and insomnia. She does not want to increase dose at this time.   Alcohol w/d Improving -will complete librium taper today -d/c gabapentin   Alcohol and crack cocaine use disorder -[facility] tomorrow  -Diabetes care manager on board and spent a lot of time with er yesterday for teaching -Changed  to 70/30 10 unit BID so she has better chance of affording this on discharge -continue SS -will need referral to [facility] for after residential treatment. Case manager was consulted  Yeast infection -ob/gyn consulted and recommended diflucan -UA negative for infection  Dispo -[place], MD 12/30/2017, 2:40 PM

## 2017-12-30 NOTE — Progress Notes (Signed)
Patient has been complaining of dizziness, aching along with increased  Anxiety, restlessness and nausea. Vital signs WNL. Received Librium 50 mg by mouth as ordered PRN. Emotional support provided. Patient was accompanied to her room and currently in bed, awake. Will continue to monitor and to provide support.

## 2017-12-30 NOTE — Progress Notes (Signed)
Patient was visible in the milieu, pleasant and cooperative. Mostly in the dayroom with peers. Interacting with staff and peers appropriately. Attended group and discussed about her motivation for treatment, wanting to be able to control her diabetes and her cravings. Patient was supportive to peers. Presented to the medication room to receive bedtime medications CBG 89, requiring no coverage. Patient received bedtime medications and did not have any major concern. Stated that she can't wait to go to a long term treatment program. Patient went to bed and has been sleeping. Safety monitored at Q 15 mn level of observation.

## 2017-12-30 NOTE — Plan of Care (Signed)
Pleasant in the milieu, participating in group activities.

## 2017-12-30 NOTE — Progress Notes (Addendum)
Recreation Therapy Notes  Date: 12/30/17  Time: 9:30 am  Location: Craft Room  Behavioral response: Appropriate   Intervention Topic: Team Work  Discussion/Intervention:  Group content on today was focused on teamwork. The group identified what teamwork is. Individuals described who is a part of their team. Patients expressed why they thought teamwork is important. The group stated reasons why they thought it was easier to work with a Dance movement psychotherapist team. Individuals discussed some positives and negatives of working with a team. Patients gave examples of past experiences they had while working with a team. The group participated in the intervention "Story in a bag", patients were in groups and were able to test their skill in a team setting.  Clinical Observations/Feedback:  Patient came to group and expressed that team work can not work well if there is no common goal. She gave an example of her job and how it was difficult working with people who had different agendas. Individual was social with peers and staff while participating in the intervention.  Doron Shake LRT/CTRS        Julie Lambert 12/30/2017 12:24 PM

## 2017-12-30 NOTE — Care Management (Signed)
CM consult for assistance with PCP/Medications.  Patient is listed as having [employer] however she does not have health insurance.  Patient should reach out to [facility] [phone] They have walk-in hours 0830-1500 M-W-F. They can help establish a provider for metal health and with these medications. A referral can be made to [facility] for medical needs (not [place] needs) however patient will need to agree to participate and provided financial statements for review to the clinic.  Please email [email or web address] if patient agrees to this service and I will make arrangements.

## 2017-12-30 NOTE — Plan of Care (Signed)
Patient endorses depression and some anxiety regarding being discharged soon. BS monitored and medicated according to sliding scale. Frequents the nursing station with several requests. Patient is compliant with her treatment plan.Denies SI/HI/AVH and pain at this time. Milieu remains safe with q 15 minute safety checks. Will continue to monitor.

## 2017-12-31 LAB — GLUCOSE, CAPILLARY: GLUCOSE-CAPILLARY: 206 mg/dL — AB (ref 70–99)

## 2017-12-31 NOTE — Progress Notes (Signed)
Recreation Therapy Notes  Date: 12/31/2017  Time: 9:30 am   Location: Craft room   Behavioral response: N/A   Intervention Topic: Emotions  Discussion/Intervention: Patient did not attend group.   Clinical Observations/Feedback:  Patient did not attend group.   Milbert Bixler LRT/CTRS        Keyler Hoge 12/31/2017 10:38 AM

## 2017-12-31 NOTE — Progress Notes (Signed)
D: Pt A & O X 4. Denies SI, HI, AVH and pain at this time. Presents anxious with depressed affect and mood. Tearful when engaged "I'm just ready for treatment, I hope this new chapter of my life will be a good one". Pt d/c to [facility]) as ordered. Drove her car from [facility] parking lot. Gas card in form of cash $25 given to pt at time of departure. A: D/C instructions reviewed with pt including prescriptions and follow up appointment; compliance encouraged. All belongings from assigned locker given to pt at time of departure. Scheduled medications given with verbal education and effects monitored. Safety checks maintained without incident till time of d/c.  R: Pt receptive to care. Compliant with medications when offered. Denies adverse drug reactions when assessed. Verbalized understanding related to d/c instructions. Ambulatory with a steady gait. Appears to be in no physical distress at time of departure.

## 2017-12-31 NOTE — Progress Notes (Signed)
Recreation Therapy Notes  INPATIENT RECREATION TR PLAN  Patient Details Name: Julie Lambert MRN: [id number] DOB: 03/25/1965 Today's Date: 12/31/2017  Rec Therapy Plan Is patient appropriate for Therapeutic Recreation?: Yes Treatment times per week: at least 3 Estimated Length of Stay: 5-7 days TR Treatment/Interventions: Group participation (Comment)  Discharge Criteria Pt will be discharged from therapy if:: Discharged Treatment plan/goals/alternatives discussed and agreed upon by:: Patient/family  Discharge Summary Short term goals set: Patient will identify 3 triggers for substance use within 5 recreation therapy group sessions Short term goals met: Adequate for discharge Progress toward goals comments: Groups attended Which groups?: Goal setting, Other (Comment)(Team work) Reason goals not met: N/A Therapeutic equipment acquired: N/A Reason patient discharged from therapy: Discharge from hospital Pt/family agrees with progress & goals achieved: Yes Date patient discharged from therapy: 12/31/17      12/31/2017, 3:45 PM  

## 2017-12-31 NOTE — Plan of Care (Signed)
Cooperative and compliant treatment.

## 2017-12-31 NOTE — Progress Notes (Addendum)
Patient started off the shift in good mood but became more and more anxious and restless,  Presented to the medication room with withdrawal symptoms: restless, anxious and aching "allover". Received Librium and Toradol. Patient went to bed and has been sleeping. Staff continue to monitor for safety/needs.  0600: Patient slept throughout the night without  discomfort. Vital signs WNL. CIWA insignificant.  Currently remains asleep and safety maintained.

## 2017-12-31 NOTE — BHH Suicide Risk Assessment (Signed)
[facility] Discharge Suicide Risk Assessment   Principal Problem: Severe alcohol use disorder [facility]) Discharge Diagnoses: Principal Problem:   Severe alcohol use disorder ([place]) Active Problems:   Bipolar 2 disorder ([place])   Diabetes mellitus without complication ([place])  Mental Status Per Nursing Assessment::   On Admission:  NA  Demographic Factors:  Low socioeconomic status and Unemployed  Loss Factors: Decrease in vocational status  Historical Factors: Impulsivity  Risk Reduction Factors:   Sense of responsibility to family and Positive coping skills or problem solving skills  Continued Clinical Symptoms:  Alcohol/Substance Abuse/Dependencies  Cognitive Features That Contribute To Risk:  None    Suicide Risk:  Minimal Acute Risk: No identifiable suicidal ideation.  Follow-up Katie, [facility]. Go on 12/31/2017.   Why:  Please follow up at [facility] on Thursday, December 31, 2017 at 1pm. Please bring photo ID, insurance card, medications. Thank you. Contact information: [place] [address] [place] Follow up.   Why:  Follow up for diabetes management after you leave [facility]. A referral was made for you and they will call you to set up an appointment.  You will need to fill out your application with financial information and bring to your appointment             Marylin Crosby, MD 12/31/2017, 10:12 AM
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
## 2018-01-02 ENCOUNTER — Emergency Department
Admission: EM | Admit: 2018-01-02 | Discharge: 2018-01-02 | Disposition: A | Discharge status: Other Acute Inpatient Hospital | Payer: No Typology Code available for payment source | Source: Home / Self Care | Attending: Emergency Medicine | Admitting: Emergency Medicine

## 2018-01-02 ENCOUNTER — Other Ambulatory Visit: Payer: Self-pay

## 2018-01-02 ENCOUNTER — Inpatient Hospital Stay
Admission: AD | Admit: 2018-01-02 | Discharge: 2018-01-11 | DRG: 885 | Disposition: A | Discharge status: Home / Self Care | Payer: No Typology Code available for payment source | Source: Intra-hospital | Attending: Psychiatry | Admitting: Psychiatry

## 2018-01-02 ENCOUNTER — Emergency Department: Payer: No Typology Code available for payment source

## 2018-01-02 ENCOUNTER — Encounter: Payer: Self-pay | Admitting: Emergency Medicine

## 2018-01-02 DIAGNOSIS — F17210 Nicotine dependence, cigarettes, uncomplicated: Secondary | ICD-10-CM | POA: Diagnosis present

## 2018-01-02 DIAGNOSIS — F17200 Nicotine dependence, unspecified, uncomplicated: Secondary | ICD-10-CM

## 2018-01-02 DIAGNOSIS — R45851 Suicidal ideations: Secondary | ICD-10-CM | POA: Diagnosis present

## 2018-01-02 DIAGNOSIS — F1420 Cocaine dependence, uncomplicated: Secondary | ICD-10-CM | POA: Diagnosis present

## 2018-01-02 DIAGNOSIS — B379 Candidiasis, unspecified: Secondary | ICD-10-CM | POA: Diagnosis present

## 2018-01-02 DIAGNOSIS — K5900 Constipation, unspecified: Secondary | ICD-10-CM | POA: Diagnosis present

## 2018-01-02 DIAGNOSIS — F319 Bipolar disorder, unspecified: Secondary | ICD-10-CM | POA: Diagnosis not present

## 2018-01-02 DIAGNOSIS — Z91041 Radiographic dye allergy status: Secondary | ICD-10-CM

## 2018-01-02 DIAGNOSIS — F314 Bipolar disorder, current episode depressed, severe, without psychotic features: Principal | ICD-10-CM | POA: Diagnosis present

## 2018-01-02 DIAGNOSIS — Z794 Long term (current) use of insulin: Secondary | ICD-10-CM | POA: Diagnosis not present

## 2018-01-02 DIAGNOSIS — M5440 Lumbago with sciatica, unspecified side: Secondary | ICD-10-CM | POA: Diagnosis present

## 2018-01-02 DIAGNOSIS — Z79899 Other long term (current) drug therapy: Secondary | ICD-10-CM | POA: Diagnosis not present

## 2018-01-02 DIAGNOSIS — F1010 Alcohol abuse, uncomplicated: Secondary | ICD-10-CM | POA: Diagnosis present

## 2018-01-02 DIAGNOSIS — E119 Type 2 diabetes mellitus without complications: Secondary | ICD-10-CM | POA: Diagnosis present

## 2018-01-02 DIAGNOSIS — N200 Calculus of kidney: Secondary | ICD-10-CM

## 2018-01-02 DIAGNOSIS — F3132 Bipolar disorder, current episode depressed, moderate: Secondary | ICD-10-CM

## 2018-01-02 DIAGNOSIS — F419 Anxiety disorder, unspecified: Secondary | ICD-10-CM | POA: Diagnosis present

## 2018-01-02 DIAGNOSIS — Z590 Homelessness: Secondary | ICD-10-CM

## 2018-01-02 DIAGNOSIS — R109 Unspecified abdominal pain: Secondary | ICD-10-CM

## 2018-01-02 DIAGNOSIS — F1410 Cocaine abuse, uncomplicated: Secondary | ICD-10-CM

## 2018-01-02 LAB — ETHANOL: Alcohol, Ethyl (B): <10 mg/dL (ref <10)

## 2018-01-02 LAB — SALICYLATE LEVEL: Salicylate Lvl: <7 mg/dL (ref 2.8–30.0)

## 2018-01-02 LAB — COMPREHENSIVE METABOLIC PANEL
ALK PHOS: 88 U/L (ref 38–126)
ALT: 19 U/L (ref 0–44)
AST: 20 U/L (ref 15–41)
Albumin: 3.9 g/dL (ref 3.5–5.0)
Anion gap: 14 (ref 5–15)
BUN: 11 mg/dL (ref 6–20)
CALCIUM: 9.2 mg/dL (ref 8.9–10.3)
CHLORIDE: 105 mmol/L (ref 98–111)
CO2: 17 mmol/L — AB (ref 22–32)
CREATININE: 0.46 mg/dL (ref 0.44–1.00)
GFR calc non Af Amer: >60 mL/min (ref >60)
GLUCOSE: 230 mg/dL — AB (ref 70–99)
Potassium: 4.2 mmol/L (ref 3.5–5.1)
SODIUM: 136 mmol/L (ref 135–145)
Total Bilirubin: 0.4 mg/dL (ref 0.3–1.2)
Total Protein: 7.3 g/dL (ref 6.5–8.1)

## 2018-01-02 LAB — URINALYSIS, COMPLETE (UACMP) WITH MICROSCOPIC
Bilirubin Urine: NEGATIVE
Glucose, UA: 150 mg/dL — AB
Hgb urine dipstick: NEGATIVE
Ketones, ur: NEGATIVE mg/dL
Leukocytes, UA: NEGATIVE
Nitrite: NEGATIVE
Protein, ur: NEGATIVE mg/dL
SPECIFIC GRAVITY, URINE: 1.006 (ref 1.005–1.030)
pH: 5 (ref 5.0–8.0)

## 2018-01-02 LAB — CBC
HEMATOCRIT: 43.7 % (ref 36.0–46.0)
HEMOGLOBIN: 14.5 g/dL (ref 12.0–15.0)
MCH: 30.3 pg (ref 26.0–34.0)
MCHC: 33.2 g/dL (ref 30.0–36.0)
MCV: 91.2 fL (ref 80.0–100.0)
NRBC: 0 % (ref 0.0–0.2)
Platelets: 268 10*3/uL (ref 150–400)
RBC: 4.79 MIL/uL (ref 3.87–5.11)
RDW: 12.3 % (ref 11.5–15.5)
WBC: 13.7 10*3/uL — ABNORMAL HIGH (ref 4.0–10.5)

## 2018-01-02 LAB — URINE DRUG SCREEN, QUALITATIVE (ARMC ONLY)
AMPHETAMINES, UR SCREEN: NOT DETECTED
BENZODIAZEPINE, UR SCRN: POSITIVE — AB
Barbiturates, Ur Screen: NOT DETECTED
COCAINE METABOLITE, UR ~~LOC~~: POSITIVE — AB
Cannabinoid 50 Ng, Ur ~~LOC~~: NOT DETECTED
MDMA (Ecstasy)Ur Screen: NOT DETECTED
METHADONE SCREEN, URINE: NOT DETECTED
Opiate, Ur Screen: NOT DETECTED
Phencyclidine (PCP) Ur S: NOT DETECTED
Tricyclic, Ur Screen: NOT DETECTED

## 2018-01-02 LAB — GLUCOSE, CAPILLARY
GLUCOSE-CAPILLARY: 196 mg/dL — AB (ref 70–99)
Glucose-Capillary: 220 mg/dL — ABNORMAL HIGH (ref 70–99)

## 2018-01-02 LAB — ACETAMINOPHEN LEVEL: Acetaminophen (Tylenol), Serum: <10 ug/mL — ABNORMAL LOW (ref 10–30)

## 2018-01-02 MED ORDER — INSULIN ASPART PROT & ASPART (70-30 MIX) 100 UNIT/ML ~~LOC~~ SUSP
10.0000 [IU] | Freq: Two times a day (BID) | SUBCUTANEOUS | Status: DC
  Administered 2018-01-02 (×2): 10 [IU] via SUBCUTANEOUS
  Filled 2018-01-02 (×2): qty 10

## 2018-01-02 MED ORDER — TRAZODONE HCL 100 MG PO TABS
100.0000 mg | ORAL_TABLET | Freq: Every evening | ORAL | Status: DC | PRN
  Administered 2018-01-02 – 2018-01-08 (×7): 100 mg via ORAL
  Filled 2018-01-02 (×7): qty 1

## 2018-01-02 MED ORDER — ALBUTEROL SULFATE (2.5 MG/3ML) 0.083% IN NEBU: 2.5000 mg | INHALATION_SOLUTION | Freq: Four times a day (QID) | RESPIRATORY_TRACT | Status: DC | PRN

## 2018-01-02 MED ORDER — QUETIAPINE FUMARATE 25 MG PO TABS: 50.0000 mg | ORAL_TABLET | Freq: Every day | ORAL | Status: DC

## 2018-01-02 MED ORDER — INSULIN ASPART PROT & ASPART (70-30 MIX) 100 UNIT/ML ~~LOC~~ SUSP
10.0000 [IU] | Freq: Two times a day (BID) | SUBCUTANEOUS | Status: DC
  Administered 2018-01-03 – 2018-01-11 (×16): 10 [IU] via SUBCUTANEOUS
  Filled 2018-01-02 (×14): qty 10

## 2018-01-02 MED ORDER — IBUPROFEN 600 MG PO TABS
600.0000 mg | ORAL_TABLET | Freq: Once | ORAL | Status: AC
  Administered 2018-01-02: 600 mg via ORAL
  Filled 2018-01-02: qty 1

## 2018-01-02 MED ORDER — MAGNESIUM HYDROXIDE 400 MG/5ML PO SUSP
30.0000 mL | Freq: Every day | ORAL | Status: DC | PRN
  Administered 2018-01-04: 30 mL via ORAL
  Filled 2018-01-02: qty 30

## 2018-01-02 MED ORDER — METFORMIN HCL 500 MG PO TABS
1000.0000 mg | ORAL_TABLET | Freq: Two times a day (BID) | ORAL | Status: DC
  Administered 2018-01-02 (×2): 1000 mg via ORAL
  Filled 2018-01-02 (×2): qty 2

## 2018-01-02 MED ORDER — LORAZEPAM 0.5 MG PO TABS
ORAL_TABLET | ORAL | Status: AC
  Administered 2018-01-02: 0.5 mg via ORAL
  Filled 2018-01-02: qty 1

## 2018-01-02 MED ORDER — GLIPIZIDE 5 MG PO TABS
2.5000 mg | ORAL_TABLET | Freq: Two times a day (BID) | ORAL | Status: DC
  Administered 2018-01-03 – 2018-01-11 (×17): 2.5 mg via ORAL
  Filled 2018-01-02 (×18): qty 0.5

## 2018-01-02 MED ORDER — ACETAMINOPHEN 325 MG PO TABS
650.0000 mg | ORAL_TABLET | Freq: Four times a day (QID) | ORAL | Status: DC | PRN
  Administered 2018-01-03 – 2018-01-09 (×6): 650 mg via ORAL
  Filled 2018-01-02 (×6): qty 2

## 2018-01-02 MED ORDER — QUETIAPINE FUMARATE 25 MG PO TABS
50.0000 mg | ORAL_TABLET | Freq: Every day | ORAL | Status: DC
  Administered 2018-01-02: 50 mg via ORAL
  Filled 2018-01-02: qty 2

## 2018-01-02 MED ORDER — LORAZEPAM 0.5 MG PO TABS
0.5000 mg | ORAL_TABLET | Freq: Once | ORAL | Status: AC
  Administered 2018-01-02: 0.5 mg via ORAL

## 2018-01-02 MED ORDER — HYDROXYZINE HCL 25 MG PO TABS
25.0000 mg | ORAL_TABLET | Freq: Three times a day (TID) | ORAL | Status: DC | PRN
  Administered 2018-01-03 – 2018-01-11 (×18): 25 mg via ORAL
  Filled 2018-01-02 (×18): qty 1

## 2018-01-02 MED ORDER — GABAPENTIN 100 MG PO CAPS
100.0000 mg | ORAL_CAPSULE | Freq: Three times a day (TID) | ORAL | Status: DC
  Administered 2018-01-02 (×2): 100 mg via ORAL
  Filled 2018-01-02 (×2): qty 1

## 2018-01-02 MED ORDER — GLIPIZIDE 5 MG PO TABS
2.5000 mg | ORAL_TABLET | Freq: Two times a day (BID) | ORAL | Status: DC
  Administered 2018-01-02 (×2): 2.5 mg via ORAL
  Filled 2018-01-02 (×3): qty 0.5

## 2018-01-02 MED ORDER — METFORMIN HCL 500 MG PO TABS
1000.0000 mg | ORAL_TABLET | Freq: Two times a day (BID) | ORAL | Status: DC
  Administered 2018-01-03 – 2018-01-11 (×17): 1000 mg via ORAL
  Filled 2018-01-02 (×17): qty 2

## 2018-01-02 MED ORDER — ALUM & MAG HYDROXIDE-SIMETH 200-200-20 MG/5ML PO SUSP: 30.0000 mL | ORAL | Status: DC | PRN

## 2018-01-02 NOTE — ED Notes (Signed)
Pt unhappy with her dinner tray, but remains cooperative with staff.    Maintained on 15 minute checks and observation by security for safety.

## 2018-01-02 NOTE — Consult Note (Signed)
Psychiatry: Tele-psychiatry recommends admission.  Chart reviewed.  Orders completed for admission to psychiatric ward.

## 2018-01-02 NOTE — ED Notes (Signed)
Pt brought one bar of Dove soap which has been labeled and will be placed at nurses desk for pt's use.

## 2018-01-02 NOTE — ED Notes (Signed)
EDP does not feel patient requires medication for withdrawal at this time.

## 2018-01-02 NOTE — ED Notes (Signed)
Vol, SOC pending

## 2018-01-02 NOTE — BH Assessment (Signed)
Patient is to be admitted to [facility] by Dr. Weber Cooks.  Attending Physician will be Dr. Wonda Olds.   Patient has been assigned to room 324, by [place] Nurse Mechele Claude.   Intake Paper Work has been signed and placed on patient chart.  ER staff is aware of the admission:  Ronnie, ER Secretary    Dr. Quentin Cornwall, ER MD   Amy B., Patient's Nurse   Levada Dy, Patient Access.

## 2018-01-02 NOTE — ED Notes (Signed)
Pt complaining of abdominal pain "all over" and right hip / leg pain.  EDP made aware. VS checked and stable.

## 2018-01-02 NOTE — BH Assessment (Signed)
This Probation officer knows pt personally and it's a conflict of interest for assessment to be completed at this time. This writer will pass report over to the next TTS on shift for assessment.

## 2018-01-02 NOTE — ED Notes (Signed)
Pt demanding gabapentin.  Pt stated it was prescribed to her when she was discharged from [facility] last week.  RN contacted pharmacy tech. This medication was not listed in the patient's records in [facility] or on file with her home pharmacy.  Pt made aware. "I have a bottle in my car."  Agitated at this time.   Maintained on 15 minute checks and observation by security for safety.

## 2018-01-02 NOTE — ED Notes (Signed)
Spoke with patient about being admitted downstairs, pt. Is aware.

## 2018-01-02 NOTE — ED Notes (Signed)
Pt making a phone call.  Maintained on 15 minute checks and observation by security for safety.

## 2018-01-02 NOTE — ED Notes (Signed)
Pt belongings at this time placed in label pt bag are :phone charger, keys, pair of gray earring and single gray earring, bracelet, 5$ dollar cash, phone, one pair gray tennis shoes, one pair black socks, one pair gray pants, gray bra, one black shirt, white underwear, and one gray jacket.

## 2018-01-02 NOTE — ED Notes (Signed)
Pt has signed voluntary consent to be transferred to BMU.  Pt given drink.   Maintained on 15 minute checks and observation by security for safety.

## 2018-01-02 NOTE — BH Assessment (Signed)
Assessment Note  Julie Lambert is an 52 y.o. female who presents to the ER due to having thoughts of ending her life. She states she have plans of overdosing on medications and she have access to the them. Patient was recently inpatient with [facility] for similar presentation, after becoming stable; she was transferred to [place] for ongoing treatment for her substance use. Patient reports of feeling unsafe at [place] and signed out after 24 hours. Upon discharging, she relapsed and spiraled into a deeper state of depression. Patient voices feelings of hopelessness, helplessness and worthlessness. She reports, she have no reason to live and wishes she was dead.  During the interview, the patient was polite and pleasant. She was also lethargic and drowsy. She states she haven't slept well in several days. She denies HI and AV/H. She denies involvement with the legal system.  Diagnosis: Depression  Past Medical History:  Past Medical History:  Diagnosis Date  . Bipolar 2 disorder ([place])   . Diabetes mellitus without complication ([place])   . Diverticulitis   . Diverticulosis   . Uterine fibroid     Past Surgical History:  Procedure Laterality Date  . BACK SURGERY    . CHOLECYSTECTOMY    . LUMBAR SPINE SURGERY      Family History:  Family History  Problem Relation Age of Onset  . CAD Mother   . Diabetes Father     Social History:  reports that she has been smoking cigarettes. She has a 17.50 pack-year smoking history. She has never used smokeless tobacco. She reports that she drinks alcohol. She reports that she has current or past drug history. Drugs: Cocaine and Marijuana.  Additional Social History:  Alcohol / Drug Use Pain Medications: See PTA Prescriptions: See PTA Over the Counter: See PTA History of alcohol / drug use?: Yes Longest period of sobriety (when/how long): Unable to quantify Negative Consequences of Use: Personal relationships, Financial Withdrawal Symptoms:  (Reports of none) Substance #1 Name of Substance 1: Cocaine 1 - Last Use / Amount: 01/02/2018 Substance #2 Name of Substance 2: Alcohol 2 - Last Use / Amount: 01/02/2018  CIWA: CIWA-Ar BP: 108/75 Pulse Rate: (!) 115 COWS:    Allergies:  Allergies  Allergen Reactions  . Iodinated Diagnostic Agents Itching    Patient stated she had itching on her back after IV injection    Home Medications:  (Not in a hospital admission)  OB/GYN Status:  No LMP recorded. Patient is perimenopausal.  General Assessment Data Location of Assessment: [facility] ED TTS Assessment: In system Is this a Tele or Face-to-Face Assessment?: Face-to-Face Is this an Initial Assessment or a Re-assessment for this encounter?: Initial Assessment Language Other than English: No Living Arrangements: Other (Comment)(Private Home) What gender do you identify as?: Female Marital status: Divorced Pregnancy Status: No Living Arrangements: Other (Comment)(Family) Can pt return to current living arrangement?: Yes Admission Status: Voluntary Is patient capable of signing voluntary admission?: Yes Referral Source: Self/Family/Friend Insurance type: Reports of none  Medical Screening Exam ([place]) Medical Exam completed: Yes  Crisis Care Plan Living Arrangements: Other (Comment)(Family) Legal Guardian: Other:(Self ) Name of Psychiatrist: Reports of none Name of Therapist: Reports of none  Education Status Is patient currently in school?: No Highest grade of school patient has completed: some college Is the patient employed, unemployed or receiving disability?: Unemployed  Risk to self with the past 6 months Suicidal Ideation: Yes-Currently Present Has patient been a risk to self within the past  6 months prior to admission? : Yes Suicidal Intent: No Has patient had any suicidal intent within the past 6 months prior to admission? : No Is patient at risk for suicide?: Yes Suicidal Plan?: Yes-Currently  Present Has patient had any suicidal plan within the past 6 months prior to admission? : Yes Specify Current Suicidal Plan: Overdose on medications Access to Means: Yes Specify Access to Suicidal Means: Have medications  What has been your use of drugs/alcohol within the last 12 months?: Cocaine & Alcohol Previous Attempts/Gestures: Yes How many times?: 1 Other Self Harm Risks: Active Drug Use Triggers for Past Attempts: None known Intentional Self Injurious Behavior: None Family Suicide History: No Recent stressful life event(s): Other (Comment) Persecutory voices/beliefs?: No Depression: Yes Depression Symptoms: Insomnia, Tearfulness, Isolating, Fatigue, Guilt, Loss of interest in usual pleasures, Feeling worthless/self pity Substance abuse history and/or treatment for substance abuse?: Yes Suicide prevention information given to non-admitted patients: Not applicable  Risk to Others within the past 6 months Homicidal Ideation: No Does patient have any lifetime risk of violence toward others beyond the six months prior to admission? : No Thoughts of Harm to Others: No Current Homicidal Intent: No Current Homicidal Plan: No Access to Homicidal Means: No Identified Victim: Reports of none History of harm to others?: No Assessment of Violence: None Noted Violent Behavior Description: Reports of none Does patient have access to weapons?: No Criminal Charges Pending?: No Does patient have a court date: No Is patient on probation?: No  Psychosis Hallucinations: None noted Delusions: None noted  Mental Status Report Appearance/Hygiene: Unremarkable, In scrubs Eye Contact: Poor Motor Activity: Freedom of movement, Unremarkable Speech: Logical/coherent, Unremarkable Level of Consciousness: Alert Mood: Depressed, Despair, Sad, Helpless, Guilty, Empty, Pleasant Affect: Appropriate to circumstance, Depressed, Sad Anxiety Level: Minimal Thought Processes: Coherent,  Relevant Judgement: Partial Orientation: Person, Place, Time, Situation, Appropriate for developmental age Obsessive Compulsive Thoughts/Behaviors: Minimal  Cognitive Functioning Concentration: Normal Memory: Recent Intact, Remote Intact Is patient IDD: No Insight: Poor Impulse Control: Fair Appetite: Fair Have you had any weight changes? : No Change Sleep: No Change Total Hours of Sleep: 8 Vegetative Symptoms: None  ADLScreening [facility] Assessment Services) Patient's cognitive ability adequate to safely complete daily activities?: Yes Patient able to express need for assistance with ADLs?: Yes Independently performs ADLs?: Yes (appropriate for developmental age)  Prior Inpatient Therapy Prior Inpatient Therapy: Yes Prior Therapy Dates: 11/20109, 1994 & 1989 Prior Therapy Facilty/Provider(s): [facility], [facility] & Treatment facility in [facility] Reason for Treatment: Depression & Substance Abuse  Prior Outpatient Therapy Prior Outpatient Therapy: No Does patient have an ACCT team?: No Does patient have Intensive In-House Services?  : No Does patient have [facility] services? : No Does patient have P4CC services?: No  ADL Screening (condition at time of admission) Patient's cognitive ability adequate to safely complete daily activities?: Yes Is the patient deaf or have difficulty hearing?: No Does the patient have difficulty seeing, even when wearing glasses/contacts?: No Does the patient have difficulty concentrating, remembering, or making decisions?: No Patient able to express need for assistance with ADLs?: Yes Does the patient have difficulty dressing or bathing?: No Independently performs ADLs?: Yes (appropriate for developmental age) Does the patient have difficulty walking or climbing stairs?: No Weakness of Legs: None Weakness of Arms/Hands: None  Home Assistive Devices/Equipment Home Assistive Devices/Equipment: None  Therapy Consults (therapy consults require a  physician order) PT Evaluation Needed: No OT Evalulation Needed: No SLP Evaluation Needed: No Abuse/Neglect Assessment (Assessment to be  complete while patient is alone) Physical Abuse: Denies Verbal Abuse: Denies Sexual Abuse: Denies Exploitation of patient/patient's resources: Denies Self-Neglect: Denies Values / Beliefs Cultural Requests During Hospitalization: None Spiritual Requests During Hospitalization: None Consults Spiritual Care Consult Needed: No Social Work Consult Needed: No Regulatory affairs officer (For Healthcare) Does Patient Have a Medical Advance Directive?: No Would patient like information on creating a medical advance directive?: No - Patient declined       Child/Adolescent Assessment Running Away Risk: Denies(Patient is an adult)  Disposition:  Disposition Initial Assessment Completed for this Encounter: Yes  On Site Evaluation by:   Reviewed with Physician:    Gunnar Fusi MS, LCAS, [place], [place], CCSI Therapeutic Triage Specialist 01/02/2018 12:08 PM

## 2018-01-02 NOTE — ED Notes (Addendum)
When asked "What brings you to the ED tonight?"  Pt. States "Don't want to live anymore".  Pt. States "I relapsed"  Pt. States "I was at [facility] yesterday and nothing went well.  Pt. States "I cant get right".  Pt. Tearful during conversation.  Pt. States having some abdominal discomfort.  Pt. States "I had a bowel movement yesterday and also took some milk of magnesia".

## 2018-01-02 NOTE — ED Provider Notes (Addendum)
[facility] Emergency Department Provider Note   ____________________________________________   First MD Initiated Contact with Patient 01/02/18 [phone]     (approximate)  I have reviewed the triage vital signs and the nursing notes.   HISTORY  Chief Complaint Suicidal    HPI Julie Lambert is a 52 y.o. female who presents to the ED from home with a chief complaint of depression with suicidal thoughts.  Patient was discharged from the behavioral health unit approximately 1 week ago.  States she is still having thoughts of wanting to hurt her self and that she has some pills in her car that she could take to overdose.  Denies HI/AH/VH.  Voices no medical complaints.   Past Medical History:  Diagnosis Date  . Bipolar 2 disorder ([place])   . Diabetes mellitus without complication ([place])   . Diverticulitis   . Diverticulosis   . Uterine fibroid     Patient Active Problem List   Diagnosis Date Noted  . Bipolar 2 disorder ([place]) 12/25/2017  . Diabetes mellitus without complication ([place]) 59/56/3875  . Severe alcohol use disorder ([place]) 12/25/2017  . Cocaine abuse ([place]) 12/25/2017    Past Surgical History:  Procedure Laterality Date  . BACK SURGERY    . CHOLECYSTECTOMY    . LUMBAR SPINE SURGERY      Prior to Admission medications   Medication Sig Start Date End Date Taking? Authorizing Provider  albuterol (PROVENTIL HFA;VENTOLIN HFA) 108 (90 Base) MCG/ACT inhaler Inhale 2 puffs into the lungs every 6 (six) hours as needed for wheezing or shortness of breath. 05/28/17   Schuyler Amor, MD  glipiZIDE (GLUCOTROL) 5 MG tablet Take 0.5 tablets (2.5 mg total) by mouth 2 (two) times daily before a meal. 12/30/17   McNew, [name], MD  insulin aspart protamine- aspart (NOVOLOG MIX 70/30) (70-30) 100 UNIT/ML injection Inject 0.1 mLs (10 Units total) into the skin 2 (two) times daily with a meal. 12/30/17   McNew, [name], MD  metFORMIN (GLUCOPHAGE) 1000  MG tablet Take 1 tablet (1,000 mg total) by mouth 2 (two) times daily with a meal. 12/30/17   McNew, [name], MD  QUEtiapine (SEROQUEL) 50 MG tablet Take 1 tablet (50 mg total) by mouth at bedtime. 12/30/17   McNew, [name], MD    Allergies Iodinated diagnostic agents  Family History  Problem Relation Age of Onset  . CAD Mother   . Diabetes Father     Social History Social History   Tobacco Use  . Smoking status: Current Every Day Smoker    Packs/day: 0.50    Years: 35.00    Pack years: 17.50    Types: Cigarettes  . Smokeless tobacco: Never Used  Substance Use Topics  . Alcohol use: Yes    Comment: last use 12/03/2017  . Drug use: Yes    Types: Cocaine, Marijuana    Review of Systems  Constitutional: No fever/chills Eyes: No visual changes. ENT: No sore throat. Cardiovascular: Denies chest pain. Respiratory: Denies shortness of breath. Gastrointestinal: No abdominal pain.  No nausea, no vomiting.  No diarrhea.  No constipation. Genitourinary: Negative for dysuria. Musculoskeletal: Negative for back pain. Skin: Negative for rash. Neurological: Negative for headaches, focal weakness or numbness. Psychiatric:Positive for depression with suicidal ideation.  ____________________________________________   PHYSICAL EXAM:  VITAL SIGNS: ED Triage Vitals  Enc Vitals Group     BP 01/02/18 0309 108/75     Pulse Rate 01/02/18 0309 (!) 115  Resp 01/02/18 0309 18     Temp 01/02/18 0309 98.7 F (37.1 C)     Temp Source 01/02/18 0309 Oral     SpO2 01/02/18 0309 99 %     Weight 01/02/18 0310 156 lb (70.8 kg)     Height 01/02/18 0310 5\' 7"  (1.702 m)     Head Circumference --      Peak Flow --      Pain Score 01/02/18 0309 5     Pain Loc --      Pain Edu? --      Excl. in [place]? --     Constitutional: Asleep, awakened for exam.  Alert and oriented. Well appearing and in no acute distress. Eyes: Conjunctivae are normal. PERRL. EOMI. Head: Atraumatic. Nose: No  congestion/rhinnorhea. Mouth/Throat: Mucous membranes are moist.  Oropharynx non-erythematous. Neck: No stridor.   Cardiovascular: Normal rate, regular rhythm. Grossly normal heart sounds.  Good peripheral circulation. Respiratory: Normal respiratory effort.  No retractions. Lungs CTAB. Gastrointestinal: Soft and nontender. No distention. No abdominal bruits. No CVA tenderness. Musculoskeletal: No lower extremity tenderness nor edema.  No joint effusions. Neurologic:  Normal speech and language. No gross focal neurologic deficits are appreciated. No gait instability. Skin:  Skin is warm, dry and intact. No rash noted. Psychiatric: Mood and affect are flat. Speech and behavior are normal.  ____________________________________________   LABS (all labs ordered are listed, but only abnormal results are displayed)  Labs Reviewed  COMPREHENSIVE METABOLIC PANEL - Abnormal; Notable for the following components:      Result Value   CO2 17 (*)    Glucose, Bld 230 (*)    All other components within normal limits  ACETAMINOPHEN LEVEL - Abnormal; Notable for the following components:   Acetaminophen (Tylenol), Serum <10 (*)    All other components within normal limits  CBC - Abnormal; Notable for the following components:   WBC 13.7 (*)    All other components within normal limits  URINE DRUG SCREEN, QUALITATIVE ([facility] ONLY) - Abnormal; Notable for the following components:   Cocaine Metabolite,Ur [place] POSITIVE (*)    Benzodiazepine, Ur Scrn POSITIVE (*)    All other components within normal limits  ETHANOL  SALICYLATE LEVEL   ____________________________________________  EKG  None ____________________________________________  RADIOLOGY  ED MD interpretation: None  Official radiology report(s): No results found.  ____________________________________________   PROCEDURES  Procedure(s) performed: None  Procedures  Critical Care performed:  No  ____________________________________________   INITIAL IMPRESSION / ASSESSMENT AND PLAN / ED COURSE  As part of my medical decision making, I reviewed the following data within the [facility] notes reviewed and incorporated, Labs reviewed, Old chart reviewed, A consult was requested and obtained from this/these consultant(s) Psychiatry and Notes from prior ED visits   52 year old female with a history of bipolar disorder with recent behavioral health admission who returns for depression with suicidal thoughts.  Laboratory results reviewed.  Patient is medically cleared.  She will remain in the ED voluntarily pending psychiatric evaluation and disposition.  Clinical Course as of Jan 04 736  Sat Jan 02, 2018  4235 Patient was evaluated by [facility] psychiatrist Dr. Luz Lex who recommends inpatient psychiatry hospitalization.  Recommended following up on TSH which patient had done last week which was unremarkable.  She may be moved to the [facility] pending psychiatric disposition.   [JS]    Clinical Course User Index [JS] Paulette Blanch, MD     ____________________________________________   FINAL  CLINICAL IMPRESSION(S) / ED DIAGNOSES  Final diagnoses:  Bipolar affective disorder, currently depressed, moderate ([place])  Cocaine abuse [employer])     ED Discharge Orders    None       Note:  This document was prepared using Dragon voice recognition software and may include unintentional dictation errors.    Paulette Blanch, MD 01/02/18 3151    Paulette Blanch, MD 01/03/18 [phone]

## 2018-01-02 NOTE — ED Triage Notes (Signed)
Pt reports that she was just DC from our facility and is still having thoughts of wanting to hurt herself. Pt states that she "has some pills in her car". Pt is rocking in triage at this time.

## 2018-01-02 NOTE — ED Notes (Signed)
SOC called, report given.

## 2018-01-02 NOTE — ED Provider Notes (Signed)
I was called to evaluate patient's complaint of abdominal pain.  She has had abdominal pain for several weeks, crampy and intermittent located in her lower abdomen.  She was evaluated by me back on 10/16 for the same abdominal pain.  At that time she was found to have trichomonas, she had a normal pelvic US.  She underwent full treatment for it.  She denies any further vaginal discharge.  Patient is well-appearing and in no distress, her abdomen reveals mild diffuse tenderness.  Labs and CT scan showing no acute findings. Patient will need a referal to GI once cleared by psych.    Rudene Re, MD 01/02/18 [phone]

## 2018-01-02 NOTE — ED Notes (Signed)
Pt agitated because she did not like her meal. "Who wants a cold grilled cheese and grapes to eat?"  (Tray given to patient right from dietary cart).  Pt also mad she has not yet been transferred to BMU. Pt further states she needs medication for her pain and withdrawal.  Pt is restless in bed. EDP made aware.

## 2018-01-02 NOTE — ED Notes (Signed)
Pt assessed by EDP.

## 2018-01-03 DIAGNOSIS — F319 Bipolar disorder, unspecified: Secondary | ICD-10-CM

## 2018-01-03 LAB — GLUCOSE, CAPILLARY
GLUCOSE-CAPILLARY: 192 mg/dL — AB (ref 70–99)
GLUCOSE-CAPILLARY: 162 mg/dL — AB (ref 70–99)
GLUCOSE-CAPILLARY: 205 mg/dL — AB (ref 70–99)
Glucose-Capillary: 116 mg/dL — ABNORMAL HIGH (ref 70–99)

## 2018-01-03 MED ORDER — QUETIAPINE FUMARATE 100 MG PO TABS
100.0000 mg | ORAL_TABLET | Freq: Every day | ORAL | Status: DC
  Administered 2018-01-03 – 2018-01-10 (×8): 100 mg via ORAL
  Filled 2018-01-03 (×8): qty 1

## 2018-01-03 MED ORDER — CLOTRIMAZOLE 1 % VA CREA
1.0000 | TOPICAL_CREAM | Freq: Every day | VAGINAL | Status: DC
  Administered 2018-01-03 – 2018-01-05 (×3): 1 via VAGINAL
  Filled 2018-01-03: qty 45

## 2018-01-03 MED ORDER — INSULIN ASPART 100 UNIT/ML ~~LOC~~ SOLN
0.0000 [IU] | Freq: Three times a day (TID) | SUBCUTANEOUS | Status: DC
  Administered 2018-01-04: 1 [IU] via SUBCUTANEOUS
  Administered 2018-01-04: 3 [IU] via SUBCUTANEOUS
  Administered 2018-01-04: 2 [IU] via SUBCUTANEOUS
  Administered 2018-01-05: 1 [IU] via SUBCUTANEOUS
  Administered 2018-01-05: 3 [IU] via SUBCUTANEOUS
  Administered 2018-01-05: 1 [IU] via SUBCUTANEOUS
  Administered 2018-01-06: 2 [IU] via SUBCUTANEOUS
  Administered 2018-01-06: 1 [IU] via SUBCUTANEOUS
  Administered 2018-01-07: 2 [IU] via SUBCUTANEOUS
  Administered 2018-01-08: 1 [IU] via SUBCUTANEOUS
  Administered 2018-01-08: 2 [IU] via SUBCUTANEOUS
  Administered 2018-01-09 (×2): 1 [IU] via SUBCUTANEOUS
  Administered 2018-01-11: 2 [IU] via SUBCUTANEOUS
  Filled 2018-01-03 (×11): qty 1

## 2018-01-03 MED ORDER — HYPROMELLOSE (GONIOSCOPIC) 2.5 % OP SOLN: 1.0000 [drp] | OPHTHALMIC | Status: DC | PRN

## 2018-01-03 MED ORDER — POLYVINYL ALCOHOL 1.4 % OP SOLN
1.0000 [drp] | OPHTHALMIC | Status: DC | PRN
  Administered 2018-01-04 – 2018-01-08 (×5): 1 [drp] via OPHTHALMIC
  Filled 2018-01-03: qty 15

## 2018-01-03 MED ORDER — NICOTINE POLACRILEX 2 MG MT GUM
2.0000 mg | CHEWING_GUM | OROMUCOSAL | Status: DC | PRN
  Administered 2018-01-03 – 2018-01-11 (×13): 2 mg via ORAL
  Filled 2018-01-03 (×13): qty 1

## 2018-01-03 NOTE — Plan of Care (Signed)
Patient wants to keep working the therapeutic program. Patient really wants a 28 day rehab program. Patient remains safe on the unit.   Problem: Safety: Goal: Periods of time without injury will increase Outcome: Progressing   Problem: Education: Goal: Knowledge of the prescribed therapeutic regimen will improve Outcome: Progressing

## 2018-01-03 NOTE — Tx Team (Signed)
Initial Treatment Plan 01/03/2018 2:37 AM Ala Dach Staley [id number]    PATIENT STRESSORS: Financial difficulties Medication change or noncompliance Substance abuse   PATIENT STRENGTHS: Agricultural engineer for treatment/growth Supportive family/friends   PATIENT IDENTIFIED PROBLEMS: Substance abuse  Depression  Anxiety                 DISCHARGE CRITERIA:  Motivation to continue treatment in a less acute level of care Verbal commitment to aftercare and medication compliance  PRELIMINARY DISCHARGE PLAN: Attend 12-step recovery group Outpatient therapy Placement in alternative living arrangements  PATIENT/FAMILY INVOLVEMENT: This treatment plan has been presented to and reviewed with the patient, Julie Lambert.  The patient has been given the opportunity to ask questions and make suggestions.  Mallie Darting, RN 01/03/2018, 2:37 AM

## 2018-01-03 NOTE — Progress Notes (Signed)
D - Report received from [place], RN at 9:52 pm. Patient admitted to the unit at 10:30 pm. Skin assessment done with Heather, small mark on left buttocks noted. No contraband found on the patient during search and assessment. Patient denies SI/HI/AVH, anxiety, depression and pain. Patient stated, "I was at [facility] for one day and I didn't like it there. The staff had poor altitudes towards me and the food was cold. I left and relapsed, I smoked some crack and had a few drinks." Patient stated, "I feel bad for relapsing because I really do want some help. I just need to find a better place for rehab." Patient was pleasant during assessment and medication administration.   A - Patient was compliant with medication administration per MD orders. Patient was oriented to the unit and given education. Patient was given support and encouragement. Patient went to sleep after receiving a snack. Patient monitored Q 15 minutes for safety per unit protocol.   R - Patient informed to let staff know if there are any issues or problems. Patient remains safe on the unit.

## 2018-01-03 NOTE — Progress Notes (Signed)
D - Patient was out in the milieu upon arrival to the unit. Patient was pleasant during assessment, but was depressed and sad. Patient stated, "I had a bad day, my son isn't worried about me even being here." Patient denies SI/HI/AVH. Patient stated she had some pain in her back, rated 10/10, medication administered per MD orders. Patient wants to find a 28 day rehab program to get sober.   A - Patient was compliant with medication administration per MD orders. Patient was given education. Patient given support and encouragement. Patient went to wrap up group and got snack.   R - Patient monitored Q 15 minutes for safety per unit protocol. Patient informed to let staff know if there are any issues or problems. Patient remains safe on the unit.

## 2018-01-03 NOTE — H&P (Signed)
Psychiatric Admission Assessment Adult  Patient Identification: Julie Lambert MRN:  [id number] Date of Evaluation:  01/03/2018 Chief Complaint:  Bipolar 2 disorder  Principal Diagnosis: Bipolar depression ([place]) Diagnosis:  Principal Problem:   Bipolar depression ([place]) Active Problems:   Diabetes mellitus without complication ([place])   Cocaine abuse ([place])  History of Present Illness: Patient was discharged from the hospital psychiatric ward on the 21st with a plan to go to the [facility] treatment [facility].  She says she got there and felt uncomfortable.  The food that they served her was too cold.  The people were unpleasant.  She decided that she would leave and so she impulsively signed out.  Of course she went out and smoked crack cocaine and then felt even more depressed which is how she presented to the emergency room.  Says she is having intrusive thoughts of suicide without a specific plan.  Mood feels out of control anxious and depressed.  No report of any hallucinations.  No specific new physical problems other than having a yeast infection. Associated Signs/Symptoms: Depression Symptoms:  depressed mood, anhedonia, psychomotor agitation, feelings of worthlessness/guilt, hopelessness, suicidal thoughts without plan, (Hypo) Manic Symptoms:  Impulsivity, Anxiety Symptoms:  Excessive Worry, Psychotic Symptoms:  None PTSD Symptoms: Negative Total Time spent with patient: 1 hour  Past Psychiatric History: Patient has a history of recurrent depression with suicidal ideation with a diagnosis of bipolar disorder.  Also has a history of cocaine abuse with recurrent relapses.  Has had substance abuse treatment in the past but recently has difficulty maintaining compliance  Is the patient at risk to self? Yes.    Has the patient been a risk to self in the past 6 months? Yes.    Has the patient been a risk to self within the distant past? Yes.    Is the patient a  risk to others? No.  Has the patient been a risk to others in the past 6 months? No.  Has the patient been a risk to others within the distant past? Yes.     Prior Inpatient Therapy:   Prior Outpatient Therapy:    Alcohol Screening:   Substance Abuse History in the last 12 months:  Yes.   Consequences of Substance Abuse: Medical Consequences:  Consequences as seen of worsening depression and presentation to the hospital Previous Psychotropic Medications: Yes  Psychological Evaluations: Yes  Past Medical History:  Past Medical History:  Diagnosis Date  . Bipolar 2 disorder ([place])   . Diabetes mellitus without complication ([place])   . Diverticulitis   . Diverticulosis   . Uterine fibroid     Past Surgical History:  Procedure Laterality Date  . BACK SURGERY    . CHOLECYSTECTOMY    . LUMBAR SPINE SURGERY     Family History:  Family History  Problem Relation Age of Onset  . CAD Mother   . Diabetes Father    Family Psychiatric  History: None noted Tobacco Screening:   Social History:  Social History   Substance and Sexual Activity  Alcohol Use Yes   Comment: last use 12/03/2017     Social History   Substance and Sexual Activity  Drug Use Yes  . Types: Cocaine, Marijuana    Additional Social History:                           Allergies:   Allergies  Allergen Reactions  . Iodinated Diagnostic  Agents Itching    Patient stated she had itching on her back after IV injection   Lab Results:  Results for orders placed or performed during the hospital encounter of 01/02/18 (from the past 48 hour(s))  Glucose, capillary     Status: Abnormal   Collection Time: 01/03/18  7:09 AM  Result Value Ref Range   Glucose-Capillary 192 (H) 70 - 99 mg/dL   Comment 1 Notify RN   Glucose, capillary     Status: Abnormal   Collection Time: 01/03/18 11:23 AM  Result Value Ref Range   Glucose-Capillary 162 (H) 70 - 99 mg/dL   Comment 1 Notify RN   Glucose, capillary      Status: Abnormal   Collection Time: 01/03/18  4:22 PM  Result Value Ref Range   Glucose-Capillary 205 (H) 70 - 99 mg/dL    Blood Alcohol level:  Lab Results  Component Value Date   ETH <10 01/02/2018   ETH <10 45/80/9983    Metabolic Disorder Labs:  Lab Results  Component Value Date   HGBA1C 12.5 (H) 12/26/2017   MPG 312.05 12/26/2017   No results found for: PROLACTIN Lab Results  Component Value Date   CHOL 215 (H) 12/26/2017   TRIG 179 (H) 12/26/2017   HDL 65 12/26/2017   CHOLHDL 3.3 12/26/2017   VLDL 36 12/26/2017   LDLCALC 114 (H) 12/26/2017   LDLCALC 79 10/09/2011    Current Medications: Current Facility-Administered Medications  Medication Dose Route Frequency Provider Last Rate Last Dose  . acetaminophen (TYLENOL) tablet 650 mg  650 mg Oral Q6H PRN Lorence Nagengast, [name], MD   650 mg at 01/03/18 1447  . albuterol (PROVENTIL) (2.5 MG/3ML) 0.083% nebulizer solution 2.5 mg  2.5 mg Inhalation Q6H PRN Senie Lanese T, MD      . alum & mag hydroxide-simeth (MAALOX/MYLANTA) 200-200-20 MG/5ML suspension 30 mL  30 mL Oral Q4H PRN Gennifer Potenza T, MD      . clotrimazole (GYNE-LOTRIMIN) vaginal cream 1 Applicatorful  1 Applicatorful Vaginal QHS Shellye Zandi T, MD      . glipiZIDE (GLUCOTROL) tablet 2.5 mg  2.5 mg Oral BID AC Lashawne Dura T, MD   2.5 mg at 01/03/18 1636  . hydrOXYzine (ATARAX/VISTARIL) tablet 25 mg  25 mg Oral TID PRN Donel Osowski, [name], MD   25 mg at 01/03/18 1633  . [START ON 01/04/2018] insulin aspart (novoLOG) injection 0-9 Units  0-9 Units Subcutaneous TID WC Cori Henningsen T, MD      . insulin aspart protamine- aspart (NOVOLOG MIX 70/30) injection 10 Units  10 Units Subcutaneous BID WC Jentzen Minasyan, [name], MD   10 Units at 01/03/18 1633  . magnesium hydroxide (MILK OF MAGNESIA) suspension 30 mL  30 mL Oral Daily PRN Breaker Springer T, MD      . metFORMIN (GLUCOPHAGE) tablet 1,000 mg  1,000 mg Oral BID WC Nikita Surman T, MD   1,000 mg at 01/03/18 1633  . nicotine  polacrilex (NICORETTE) gum 2 mg  2 mg Oral PRN Lexiana Spindel, [name], MD   2 mg at 01/03/18 1638  . polyvinyl alcohol (LIQUIFILM TEARS) 1.4 % ophthalmic solution 1 drop  1 drop Both Eyes PRN [name], RPH      . QUEtiapine (SEROQUEL) tablet 100 mg  100 mg Oral QHS Rhiley Tarver T, MD      . traZODone (DESYREL) tablet 100 mg  100 mg Oral QHS PRN Zerenity Bowron, [name], MD   100 mg at  01/02/18 2254   PTA Medications: Medications Prior to Admission  Medication Sig Dispense Refill Last Dose  . albuterol (PROVENTIL HFA;VENTOLIN HFA) 108 (90 Base) MCG/ACT inhaler Inhale 2 puffs into the lungs every 6 (six) hours as needed for wheezing or shortness of breath. 1 Inhaler 2 PRN at PRN  . glipiZIDE (GLUCOTROL) 5 MG tablet Take 0.5 tablets (2.5 mg total) by mouth 2 (two) times daily before a meal. 60 tablet 0   . insulin aspart protamine- aspart (NOVOLOG MIX 70/30) (70-30) 100 UNIT/ML injection Inject 0.1 mLs (10 Units total) into the skin 2 (two) times daily with a meal. 10 mL 0   . metFORMIN (GLUCOPHAGE) 1000 MG tablet Take 1 tablet (1,000 mg total) by mouth 2 (two) times daily with a meal. 60 tablet 0   . QUEtiapine (SEROQUEL) 50 MG tablet Take 1 tablet (50 mg total) by mouth at bedtime.       Musculoskeletal: Strength & Muscle Tone: within normal limits Gait & Station: normal Patient leans: N/A  Psychiatric Specialty Exam: Physical Exam  Nursing note and vitals reviewed. Constitutional: She appears well-developed and well-nourished.  HENT:  Head: Normocephalic and atraumatic.  Eyes: Pupils are equal, round, and reactive to light. Conjunctivae are normal.  Neck: Normal range of motion.  Cardiovascular: Regular rhythm and normal heart sounds.  Respiratory: Effort normal.  GI: Soft.  Musculoskeletal: Normal range of motion.  Neurological: She is alert.  Skin: Skin is warm and dry.  Psychiatric: Her speech is normal and behavior is normal. Judgment normal. Her mood appears anxious. Cognition and  memory are normal. She expresses suicidal ideation. She expresses no suicidal plans.    Review of Systems  Constitutional: Negative.   HENT: Negative.   Eyes: Negative.   Respiratory: Negative.   Cardiovascular: Negative.   Gastrointestinal: Negative.   Musculoskeletal: Negative.   Skin: Negative.   Neurological: Negative.   Psychiatric/Behavioral: Positive for depression, substance abuse and suicidal ideas. Negative for hallucinations and memory loss. The patient is nervous/anxious and has insomnia.     Blood pressure 116/83, pulse 90, temperature 98 F (36.7 C), temperature source Oral, resp. rate 18, height 5' 7.01" (1.702 m), weight 70.8 kg, SpO2 100 %.Body mass index is 24.44 kg/m.  General Appearance: Casual  Eye Contact:  Good  Speech:  Clear and Coherent  Volume:  Normal  Mood:  Anxious and Depressed  Affect:  Congruent  Thought Process:  Goal Directed  Orientation:  Full (Time, Place, and Person)  Thought Content:  Logical  Suicidal Thoughts:  Yes.  without intent/plan  Homicidal Thoughts:  No  Memory:  Immediate;   Fair Recent;   Fair Remote;   Fair  Judgement:  Impaired  Insight:  Shallow  Psychomotor Activity:  Decreased  Concentration:  Concentration: [place]  Recall:  [employer] of Knowledge:  Fair  Language:  Fair  Akathisia:  No  Handed:  Right  AIMS (if indicated):     Assets:  Desire for Improvement Resilience  ADL's:  Intact  Cognition:  WNL  Sleep:  Number of Hours: 6.25    Treatment Plan Summary: Daily contact with patient to assess and evaluate symptoms and progress in treatment, Medication management and Plan Patient restarted on her psychiatric medicine and also on diabetes medicine.  As far as diabetes goes we will also have a sliding scale.  Engage patient in individual and group psychotherapy on the unit.  No current change to psychiatric medicine.  Supportive counseling review of treatment  plan.  Observation Level/Precautions:  15 minute  checks  Laboratory:  Chemistry Profile  Psychotherapy:    Medications:    Consultations:    Discharge Concerns:    Estimated LOS:  Other:     Physician Treatment Plan for Primary Diagnosis: Bipolar depression ([place]) Long Term Goal(s): Improvement in symptoms so as ready for discharge  Short Term Goals: Ability to verbalize feelings will improve and Ability to disclose and discuss suicidal ideas  Physician Treatment Plan for Secondary Diagnosis: Principal Problem:   Bipolar depression ([place]) Active Problems:   Diabetes mellitus without complication ([place])   Cocaine abuse ([place])  Long Term Goal(s): Improvement in symptoms so as ready for discharge  Short Term Goals: Ability to identify changes in lifestyle to reduce recurrence of condition will improve and Ability to identify triggers associated with substance abuse/mental health issues will improve  I certify that inpatient services furnished can reasonably be expected to improve the patient's condition.    Alethia Berthold, MD 11/24/20195:07 PM

## 2018-01-03 NOTE — Plan of Care (Signed)
Patient newly admitted to the unit, hasn't had time to progress yet.   Problem: Education: Goal: Knowledge of [facility] Education information/materials will improve Outcome: Not Progressing   Problem: Safety: Goal: Periods of time without injury will increase Outcome: Not Progressing   Problem: Education: Goal: Utilization of techniques to improve thought processes will improve Outcome: Not Progressing Goal: Knowledge of the prescribed therapeutic regimen will improve Outcome: Not Progressing   Problem: Safety: Goal: Ability to disclose and discuss suicidal ideas will improve Outcome: Not Progressing Goal: Ability to identify and utilize support systems that promote safety will improve Outcome: Not Progressing

## 2018-01-03 NOTE — BHH Suicide Risk Assessment (Signed)
[facility] Admission Suicide Risk Assessment   Nursing information obtained from:  Patient Demographic factors:  Low socioeconomic status, Living alone, Unemployed Current Mental Status:  NA Loss Factors:  NA Historical Factors:  NA Risk Reduction Factors:  NA  Total Time spent with patient: 1 hour Principal Problem: Bipolar depression ([place]) Diagnosis:  Principal Problem:   Bipolar depression ([place]) Active Problems:   Diabetes mellitus without complication ([place])   Cocaine abuse ([place])  Subjective Data: Patient reports that she is not currently having any suicidal or homicidal thought.  Had some suicidal thoughts yesterday and felt hopeless.  Today feeling more stable.  Denies psychotic symptoms.  Continued Clinical Symptoms:    The "Alcohol Use Disorders Identification Test", Guidelines for Use in Primary Care, Second Edition.  World Pharmacologist [facility]). Score between 0-7:  no or low risk or alcohol related problems. Score between 8-15:  moderate risk of alcohol related problems. Score between 16-19:  high risk of alcohol related problems. Score 20 or above:  warrants further diagnostic evaluation for alcohol dependence and treatment.   CLINICAL FACTORS:   Depression:   Anhedonia Hopelessness Impulsivity Alcohol/Substance Abuse/Dependencies   Musculoskeletal: Strength & Muscle Tone: within normal limits Gait & Station: normal Patient leans: N/A  Psychiatric Specialty Exam: Physical Exam  ROS  Blood pressure 116/83, pulse 90, temperature 98 F (36.7 C), temperature source Oral, resp. rate 18, height 5' 7.01" (1.702 m), weight 70.8 kg, SpO2 100 %.Body mass index is 24.44 kg/m.  General Appearance: Casual  Eye Contact:  Good  Speech:  Clear and Coherent  Volume:  Normal  Mood:  Dysphoric  Affect:  Congruent  Thought Process:  Goal Directed  Orientation:  Full (Time, Place, and Person)  Thought Content:  Logical  Suicidal Thoughts:  No  Homicidal Thoughts:  No   Memory:  Immediate;   Fair Recent;   Fair Remote;   Fair  Judgement:  Fair  Insight:  Fair  Psychomotor Activity:  Decreased  Concentration:  Concentration: Fair  Recall:  [employer] of Knowledge:  Fair  Language:  Fair  Akathisia:  No  Handed:  Right  AIMS (if indicated):     Assets:  Desire for Improvement Resilience  ADL's:  Intact  Cognition:  WNL  Sleep:  Number of Hours: 6.25      COGNITIVE FEATURES THAT CONTRIBUTE TO RISK:  Loss of executive function    SUICIDE RISK:   Minimal: No identifiable suicidal ideation.  Patients presenting with no risk factors but with morbid ruminations; may be classified as minimal risk based on the severity of the depressive symptoms  PLAN OF CARE: Patient admitted to the psychiatric ward.  15-minute checks in place.  Restart medication.  Engage in individual and group therapy.  Reassess suicidality before discharge  I certify that inpatient services furnished can reasonably be expected to improve the patient's condition.   Alethia Berthold, MD 01/03/2018, 5:05 PM

## 2018-01-03 NOTE — BHH Group Notes (Signed)
LCSW Group Therapy Note 01/03/2018 1:15pm  Type of Therapy and Topic: Group Therapy: Feelings Around Returning Home & Establishing a Supportive Framework and Supporting Oneself When Supports Not Available  Participation Level: Did Not Attend  Description of Group:  Patients first processed thoughts and feelings about upcoming discharge. These included fears of upcoming changes, lack of change, new living environments, judgements and expectations from others and overall stigma of mental health issues. The group then discussed the definition of a supportive framework, what that looks and feels like, and how do to discern it from an unhealthy non-supportive network. The group identified different types of supports as well as what to do when your family/friends are less than helpful or unavailable  Therapeutic Goals  1. Patient will identify one healthy supportive network that they can use at discharge. 2. Patient will identify one factor of a supportive framework and how to tell it from an unhealthy network. 3. Patient able to identify one coping skill to use when they do not have positive supports from others. 4. Patient will demonstrate ability to communicate their needs through discussion and/or role plays.  Summary of Patient Progress:  Pt was invited to attend group but chose not to attend. CSW will continue to encourage pt to attend group throughout their admission.   Therapeutic Modalities Cognitive Behavioral Therapy Motivational Interviewing   Julie Lambert  CUEBAS-COLON, LCSW 01/03/2018 12:07 PM

## 2018-01-03 NOTE — Plan of Care (Signed)
No safety concerns   compliant  with medication . Denies suicidal ideations Denies auditory hallucinations  No pain concerns .  Appropriate ADL'S. Limited Interacting with peers and staff. Patient aware of educational  information received from staff , voice of understanding . Patient  able to rest between group periods . Verbalize feelings, working on Radiographer, therapeutic  . Compliant  with medications  Unable to identify  factors of anxiety .    Problem: Education: Goal: Knowledge of [place] [facility] Education information/materials will improve Outcome: Progressing   Problem: Safety: Goal: Periods of time without injury will increase Outcome: Progressing   Problem: Education: Goal: Utilization of techniques to improve thought processes will improve Outcome: Progressing Goal: Knowledge of the prescribed therapeutic regimen will improve Outcome: Progressing   Problem: Safety: Goal: Ability to disclose and discuss suicidal ideas will improve Outcome: Progressing Goal: Ability to identify and utilize support systems that promote safety will improve Outcome: Progressing
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
## 2018-01-03 NOTE — Progress Notes (Signed)
D: Patient stated slept good last night .Stated appetite is good and energy level  Is normal. Stated concentration is good . Stated Depression and, hopeless with  anxiety . Denies suicidal  homicidal ideations  .  No auditory hallucinations   . Appropriate ADL'S. Interacting with peers and staff.  No safety concerns   compliant  with medication . Denies suicidal ideations Denies auditory hallucinations  No pain concerns .  Appropriate ADL'S. Limited Interacting with peers and staff. Patient aware of educational  information received from staff , voice of understanding . Patient  able to rest between group periods . Verbalize feelings, working on Radiographer, therapeutic  . Compliant  with medications  Unable to identify  factors of anxiety . Patient relapse  , continue to look for rehab services   A: Encourage patient participation with unit programming . Instruction  Given on  Medication , verbalize understanding.  R: Voice no other concerns. Staff continue to monitor

## 2018-01-04 ENCOUNTER — Encounter: Payer: Self-pay | Admitting: Psychiatry

## 2018-01-04 DIAGNOSIS — R45851 Suicidal ideations: Secondary | ICD-10-CM

## 2018-01-04 DIAGNOSIS — F314 Bipolar disorder, current episode depressed, severe, without psychotic features: Principal | ICD-10-CM

## 2018-01-04 DIAGNOSIS — F17200 Nicotine dependence, unspecified, uncomplicated: Secondary | ICD-10-CM

## 2018-01-04 LAB — GLUCOSE, CAPILLARY
GLUCOSE-CAPILLARY: 197 mg/dL — AB (ref 70–99)
GLUCOSE-CAPILLARY: 131 mg/dL — AB (ref 70–99)
GLUCOSE-CAPILLARY: 118 mg/dL — AB (ref 70–99)
Glucose-Capillary: 249 mg/dL — ABNORMAL HIGH (ref 70–99)

## 2018-01-04 MED ORDER — SENNOSIDES-DOCUSATE SODIUM 8.6-50 MG PO TABS
1.0000 | ORAL_TABLET | Freq: Every day | ORAL | Status: DC
  Administered 2018-01-04 – 2018-01-10 (×7): 1 via ORAL
  Filled 2018-01-04 (×8): qty 1

## 2018-01-04 MED ORDER — POLYETHYLENE GLYCOL 3350 17 G PO PACK
17.0000 g | PACK | Freq: Every day | ORAL | Status: DC
  Administered 2018-01-05 – 2018-01-06 (×2): 17 g via ORAL
  Filled 2018-01-04 (×3): qty 1

## 2018-01-04 NOTE — BHH Group Notes (Signed)
LCSW Group Therapy Note   01/04/2018 1:15pm   Type of Therapy and Topic:  Group Therapy:  Overcoming Obstacles   Participation Level:  Active   Description of Group:    In this group patients will be encouraged to explore what they see as obstacles to their own wellness and recovery. They will be guided to discuss their thoughts, feelings, and behaviors related to these obstacles. The group will process together ways to cope with barriers, with attention given to specific choices patients can make. Each patient will be challenged to identify changes they are motivated to make in order to overcome their obstacles. This group will be process-oriented, with patients participating in exploration of their own experiences as well as giving and receiving support and challenge from other group members.   Therapeutic Goals: 1. Patient will identify personal and current obstacles as they relate to admission. 2. Patient will identify barriers that currently interfere with their wellness or overcoming obstacles.  3. Patient will identify feelings, thought process and behaviors related to these barriers. 4. Patient will identify two changes they are willing to make to overcome these obstacles:      Summary of Patient Progress  Stayed the entire time, engaged throughout.  Insists that if she just got on the right medication for anxiety, she would be fine.  Spoke at length about not having any supports, of making sacrifices to get into treatment "because I will be dead if I don't."  Felt more like someone feeling sorry for themselves than dedicated to the process of sobriety.      Therapeutic Modalities:   Cognitive Behavioral Therapy Solution Focused Therapy Motivational Interviewing Relapse Prevention Therapy  Trish Mage, LCSW 01/04/2018 4:23 PM

## 2018-01-04 NOTE — Tx Team (Addendum)
Interdisciplinary Treatment and Diagnostic Plan Update  01/04/2018 Time of Session: 1030am Julie Lambert MRN: [id number]  Principal Diagnosis: Bipolar depression [facility])  Secondary Diagnoses: Principal Problem:   Bipolar depression ([place]) Active Problems:   Diabetes mellitus without complication ([place])   Cocaine abuse ([place])   Current Medications:  Current Facility-Administered Medications  Medication Dose Route Frequency Provider Last Rate Last Dose  . acetaminophen (TYLENOL) tablet 650 mg  650 mg Oral Q6H PRN Clapacs, [name], MD   650 mg at 01/03/18 2140  . albuterol (PROVENTIL) (2.5 MG/3ML) 0.083% nebulizer solution 2.5 mg  2.5 mg Inhalation Q6H PRN Clapacs, [name], MD      . alum & mag hydroxide-simeth (MAALOX/MYLANTA) 200-200-20 MG/5ML suspension 30 mL  30 mL Oral Q4H PRN Clapacs, [name], MD      . clotrimazole (GYNE-LOTRIMIN) vaginal cream 1 Applicatorful  1 Applicatorful Vaginal QHS Clapacs, [name], MD   1 Applicatorful at 93/71/69 2144  . glipiZIDE (GLUCOTROL) tablet 2.5 mg  2.5 mg Oral BID AC Clapacs, [name], MD   2.5 mg at 01/04/18 0743  . hydrOXYzine (ATARAX/VISTARIL) tablet 25 mg  25 mg Oral TID PRN Clapacs, [name], MD   25 mg at 01/04/18 0751  . insulin aspart (novoLOG) injection 0-9 Units  0-9 Units Subcutaneous TID WC Clapacs, [name], MD   2 Units at 01/04/18 1159  . insulin aspart protamine- aspart (NOVOLOG MIX 70/30) injection 10 Units  10 Units Subcutaneous BID WC Clapacs, [name], MD   10 Units at 01/04/18 0748  . magnesium hydroxide (MILK OF MAGNESIA) suspension 30 mL  30 mL Oral Daily PRN Clapacs, [name], MD      . metFORMIN (GLUCOPHAGE) tablet 1,000 mg  1,000 mg Oral BID WC Clapacs, [name], MD   1,000 mg at 01/04/18 0743  . nicotine polacrilex (NICORETTE) gum 2 mg  2 mg Oral PRN Clapacs, [name], MD   2 mg at 01/04/18 1020  . polyvinyl alcohol (LIQUIFILM TEARS) 1.4 % ophthalmic solution 1 drop  1 drop Both Eyes PRN [name], RPH      . QUEtiapine  (SEROQUEL) tablet 100 mg  100 mg Oral QHS Clapacs, [name], MD   100 mg at 01/03/18 2141  . traZODone (DESYREL) tablet 100 mg  100 mg Oral QHS PRN Clapacs, [name], MD   100 mg at 01/03/18 2140   PTA Medications: Medications Prior to Admission  Medication Sig Dispense Refill Last Dose  . albuterol (PROVENTIL HFA;VENTOLIN HFA) 108 (90 Base) MCG/ACT inhaler Inhale 2 puffs into the lungs every 6 (six) hours as needed for wheezing or shortness of breath. 1 Inhaler 2 PRN at PRN  . glipiZIDE (GLUCOTROL) 5 MG tablet Take 0.5 tablets (2.5 mg total) by mouth 2 (two) times daily before a meal. 60 tablet 0   . insulin aspart protamine- aspart (NOVOLOG MIX 70/30) (70-30) 100 UNIT/ML injection Inject 0.1 mLs (10 Units total) into the skin 2 (two) times daily with a meal. 10 mL 0   . metFORMIN (GLUCOPHAGE) 1000 MG tablet Take 1 tablet (1,000 mg total) by mouth 2 (two) times daily with a meal. 60 tablet 0   . QUEtiapine (SEROQUEL) 50 MG tablet Take 1 tablet (50 mg total) by mouth at bedtime.       Patient Stressors: Financial difficulties Medication change or noncompliance Substance abuse  Patient Strengths: Agricultural engineer for treatment/growth Supportive family/friends  Treatment Modalities: Medication Management, Group therapy, Case management,  1 to 1 session with clinician, Psychoeducation, Recreational therapy.   Physician Treatment Plan for Primary Diagnosis: Bipolar depression ([place]) Long Term Goal(s): Improvement in symptoms so as ready for discharge Improvement in symptoms so as ready for discharge   Short Term Goals: Ability to verbalize feelings will improve Ability to disclose and discuss suicidal ideas Ability to identify changes in lifestyle to reduce recurrence of condition will improve Ability to identify triggers associated with substance abuse/mental health issues will improve  Medication Management: Evaluate patient's response, side effects, and tolerance of  medication regimen.  Therapeutic Interventions: 1 to 1 sessions, Unit Group sessions and Medication administration.  Evaluation of Outcomes: Progressing  Physician Treatment Plan for Secondary Diagnosis: Principal Problem:   Bipolar depression ([place]) Active Problems:   Diabetes mellitus without complication ([place])   Cocaine abuse ([place])  Long Term Goal(s): Improvement in symptoms so as ready for discharge Improvement in symptoms so as ready for discharge   Short Term Goals: Ability to verbalize feelings will improve Ability to disclose and discuss suicidal ideas Ability to identify changes in lifestyle to reduce recurrence of condition will improve Ability to identify triggers associated with substance abuse/mental health issues will improve     Medication Management: Evaluate patient's response, side effects, and tolerance of medication regimen.  Therapeutic Interventions: 1 to 1 sessions, Unit Group sessions and Medication administration.  Evaluation of Outcomes: Progressing   RN Treatment Plan for Primary Diagnosis: Bipolar depression ([place]) Long Term Goal(s): Knowledge of disease and therapeutic regimen to maintain health will improve  Short Term Goals: Ability to verbalize feelings will improve, Ability to identify and develop effective coping behaviors will improve and Compliance with prescribed medications will improve  Medication Management: RN will administer medications as ordered by provider, will assess and evaluate patient's response and provide education to patient for prescribed medication. RN will report any adverse and/or side effects to prescribing provider.  Therapeutic Interventions: 1 on 1 counseling sessions, Psychoeducation, Medication administration, Evaluate responses to treatment, Monitor vital signs and CBGs as ordered, Perform/monitor CIWA, COWS, AIMS and Fall Risk screenings as ordered, Perform wound care treatments as ordered.  Evaluation of Outcomes:  Progressing   LCSW Treatment Plan for Primary Diagnosis: Bipolar depression ([place]) Long Term Goal(s): Safe transition to appropriate next level of care at discharge, Engage patient in therapeutic group addressing interpersonal concerns.  Short Term Goals: Engage patient in aftercare planning with referrals and resources, Increase social support, Increase ability to appropriately verbalize feelings, Increase emotional regulation, Facilitate acceptance of mental health diagnosis and concerns, Identify triggers associated with mental health/substance abuse issues and Increase skills for wellness and recovery  Therapeutic Interventions: Assess for all discharge needs, 1 to 1 time with Social worker, Explore available resources and support systems, Assess for adequacy in community support network, Educate family and significant other(s) on suicide prevention, Complete Psychosocial Assessment, Interpersonal group therapy.  Evaluation of Outcomes: Progressing   Progress in Treatment: Attending groups: Yes. Participating in groups: Yes. Taking medication as prescribed: Yes. Toleration medication: Yes. Family/Significant other contact made: No, will contact:  Gillermo Murdoch, son Patient understands diagnosis: Yes. Discussing patient identified problems/goals with staff: Yes. Medical problems stabilized or resolved: Yes. Denies suicidal/homicidal ideation: Yes. Issues/concerns per patient self-inventory: No. Other: NA  New problem(s) identified: No, Describe:  None reported  New Short Term/Long Term Goal(s):"Attend more groups, not isolate myself so much"  Patient Goals:  "Attend more groups, not isolate myself so much"  Discharge Plan or Barriers: Pt will return home  and follow up with outpatient treatment  Reason for Continuation of Hospitalization: Medication stabilization  Estimated Length of Stay:5-7 days  Recreational Therapy: Patient Stressors: N/A Patient Goal: Patient  will successfully identify 2 ways of making healthy decisions post d/c within 5 recreation therapy group sessions  Attendees: Patient:Julie Lambert 01/04/2018 2:06 PM  Physician: Donato Heinz, MD 01/04/2018 2:06 PM  Nursing:Gwen Rudie Meyer, RN  01/04/2018 2:06 PM  RN Care Manager: 01/04/2018 2:06 PM  Social Worker: Sanjuana Kava, LCSW 01/04/2018 2:06 PM  Recreational Therapist: Isaias Sakai. Marcello Fennel, LRT 01/04/2018 2:06 PM  Other: Roque Lias, LCSW 01/04/2018 2:06 PM  Other:  01/04/2018 2:06 PM  Other: 01/04/2018 2:06 PM    Scribe for Treatment Team: Yvette Rack, LCSW 01/04/2018 2:06 PM

## 2018-01-04 NOTE — Progress Notes (Signed)
Recreation Therapy Notes  Date: 01/04/18  Time: 9:30 am  Location: Craft Room  Behavioral response: Appropriate   Intervention Topic: Leisure  Discussion/Intervention:  Group content today was focused on leisure. The group defined what leisure is and some positive leisure activities they participate in. Individuals identified the difference between good and bad leisure. Participants expressed how they feel after participating in the leisure of their choice. The group discussed how they go about picking a leisure activity and if others are involved in their leisure activities. The patient stated how many leisure activities they too choose from and reasons why it is important to have leisure time. Individuals participated in the intervention "Leisure Jeopardy" where they had a chance to identify new leisure activities as well as benefits of leisure. Clinical Observations/Feedback:  Patient came to group late due to unknown reasons. Individual was social with peers and staff while participating in the intervention.  Maiana Hennigan LRT/CTRS        Tinita Brooker 01/04/2018 11:54 AM

## 2018-01-04 NOTE — Plan of Care (Signed)
No safety concerns   compliant  with medication . Denies suicidal ideations Denies auditory hallucinations  No pain concerns .  Appropriate ADL'S. Limited Interacting with peers and staff. Patient aware of educational  information received from staff , voice of understanding . Patient  able to rest between group periods . Verbalize feelings, working on Radiographer, therapeutic  . Compliant  with medications   looking at other resources for Rehab.  Problem: Education: Goal: Knowledge of [place] [facility] Education information/materials will improve Outcome: Progressing   Problem: Safety: Goal: Periods of time without injury will increase Outcome: Progressing   Problem: Education: Goal: Utilization of techniques to improve thought processes will improve Outcome: Progressing Goal: Knowledge of the prescribed therapeutic regimen will improve Outcome: Progressing   Problem: Safety: Goal: Ability to disclose and discuss suicidal ideas will improve Outcome: Progressing Goal: Ability to identify and utilize support systems that promote safety will improve Outcome: Progressing

## 2018-01-04 NOTE — Progress Notes (Signed)
D: Patient stated slept good last night .Stated appetite is good and energy level  Is normal. Stated concentration is good . Stated on Depression scale 10 , hopeless 10 and anxiety 10 .( low 0-10 high) Denies suicidal  homicidal ideations  .  No auditory hallucinations  No pain concerns . Appropriate ADL'S. Interacting with peers and staff. No safety concerns   compliant  with medication . Denies suicidal ideations Denies auditory hallucinations  No pain concerns .  Appropriate ADL'S. Limited Interacting with peers and staff. Patient aware of educational  information received from staff , voice of understanding . Patient  able to rest between group periods . Verbalize feelings, working on Radiographer, therapeutic  . Compliant  with medications   looking at other resources for Rehab. Patient  Voice of being upset  With her bother , patient did not say anything else  A: Encourage patient participation with unit programming . Instruction  Given on  Medication , verbalize understanding.  R: Voice no other concerns. Staff continue to monitor

## 2018-01-04 NOTE — Progress Notes (Addendum)
[facility] MD Progress Note  01/04/2018 4:39 PM Julie Lambert  MRN:  [id number]  Subjective:    Julie Lambert met with treatment team today. She is very tearful, sad and hopeless. She is also suicidal believing that if discharged today, she would not live till next holidays. She left [facility] program prematurely and wants it no more but has been looking into long-term program like [facility] in [place]. Complaints of constipation and sciatica.    Principal Problem: Bipolar I disorder, most recent episode depressed, severe without psychotic features ([place]) Diagnosis: Principal Problem:   Bipolar I disorder, most recent episode depressed, severe without psychotic features ([place]) Active Problems:   Diabetes mellitus without complication ([place])   Cocaine use disorder, moderate, dependence ([place])   Tobacco use disorder   Suicidal ideation  Total Time spent with patient: 20 minutes  Past Psychiatric History: substance abuse, bipolar  Past Medical History:  Past Medical History:  Diagnosis Date  . Bipolar 2 disorder ([place])   . Diabetes mellitus without complication ([place])   . Diverticulitis   . Diverticulosis   . Uterine fibroid     Past Surgical History:  Procedure Laterality Date  . BACK SURGERY    . CHOLECYSTECTOMY    . LUMBAR SPINE SURGERY     Family History:  Family History  Problem Relation Age of Onset  . CAD Mother   . Diabetes Father    Family Psychiatric  History: see H&P Social History:  Social History   Substance and Sexual Activity  Alcohol Use Yes   Comment: last use 12/03/2017     Social History   Substance and Sexual Activity  Drug Use Yes  . Types: Cocaine, Marijuana    Social History   Socioeconomic History  . Marital status: Divorced    Spouse name: Not on file  . Number of children: Not on file  . Years of education: Not on file  . Highest education level: Not on file  Occupational History  . Not on file  Social Needs  . Financial  resource strain: Not on file  . Food insecurity:    Worry: Not on file    Inability: Not on file  . Transportation needs:    Medical: Not on file    Non-medical: Not on file  Tobacco Use  . Smoking status: Current Every Day Smoker    Packs/day: 0.50    Years: 35.00    Pack years: 17.50    Types: Cigarettes  . Smokeless tobacco: Never Used  Substance and Sexual Activity  . Alcohol use: Yes    Comment: last use 12/03/2017  . Drug use: Yes    Types: Cocaine, Marijuana  . Sexual activity: Yes  Lifestyle  . Physical activity:    Days per week: Not on file    Minutes per session: Not on file  . Stress: Not on file  Relationships  . Social connections:    Talks on phone: Not on file    Gets together: Not on file    Attends religious service: Not on file    Active member of club or organization: Not on file    Attends meetings of clubs or organizations: Not on file    Relationship status: Not on file  Other Topics Concern  . Not on file  Social History Narrative  . Not on file   Additional Social History:  Sleep: Fair  Appetite:  Fair  Current Medications: Current Facility-Administered Medications  Medication Dose Route Frequency Provider Last Rate Last Dose  . acetaminophen (TYLENOL) tablet 650 mg  650 mg Oral Q6H PRN Clapacs, [name], MD   650 mg at 01/04/18 1510  . albuterol (PROVENTIL) (2.5 MG/3ML) 0.083% nebulizer solution 2.5 mg  2.5 mg Inhalation Q6H PRN Clapacs, [name], MD      . alum & mag hydroxide-simeth (MAALOX/MYLANTA) 200-200-20 MG/5ML suspension 30 mL  30 mL Oral Q4H PRN Clapacs, [name], MD      . clotrimazole (GYNE-LOTRIMIN) vaginal cream 1 Applicatorful  1 Applicatorful Vaginal QHS Clapacs, [name], MD   1 Applicatorful at 88/41/66 2144  . glipiZIDE (GLUCOTROL) tablet 2.5 mg  2.5 mg Oral BID AC Clapacs, [name], MD   2.5 mg at 01/04/18 1625  . hydrOXYzine (ATARAX/VISTARIL) tablet 25 mg  25 mg Oral TID PRN Clapacs, [name], MD    25 mg at 01/04/18 0751  . insulin aspart (novoLOG) injection 0-9 Units  0-9 Units Subcutaneous TID WC Clapacs, [name], MD   1 Units at 01/04/18 1628  . insulin aspart protamine- aspart (NOVOLOG MIX 70/30) injection 10 Units  10 Units Subcutaneous BID WC Clapacs, [name], MD   10 Units at 01/04/18 1628  . magnesium hydroxide (MILK OF MAGNESIA) suspension 30 mL  30 mL Oral Daily PRN Clapacs, [name], MD   30 mL at 01/04/18 1510  . metFORMIN (GLUCOPHAGE) tablet 1,000 mg  1,000 mg Oral BID WC Clapacs, [name], MD   1,000 mg at 01/04/18 1626  . nicotine polacrilex (NICORETTE) gum 2 mg  2 mg Oral PRN Clapacs, [name], MD   2 mg at 01/04/18 1511  . polyethylene glycol (MIRALAX / GLYCOLAX) packet 17 g  17 g Oral Daily Ginnie Marich B, MD      . polyvinyl alcohol (LIQUIFILM TEARS) 1.4 % ophthalmic solution 1 drop  1 drop Both Eyes PRN [name], RPH      . QUEtiapine (SEROQUEL) tablet 100 mg  100 mg Oral QHS Clapacs, [name], MD   100 mg at 01/03/18 2141  . senna-docusate (Senokot-S) tablet 1 tablet  1 tablet Oral QHS Vernia Teem B, MD      . traZODone (DESYREL) tablet 100 mg  100 mg Oral QHS PRN Clapacs, [name], MD   100 mg at 01/03/18 2140    Lab Results:  Results for orders placed or performed during the hospital encounter of 01/02/18 (from the past 48 hour(s))  Glucose, capillary     Status: Abnormal   Collection Time: 01/03/18  7:09 AM  Result Value Ref Range   Glucose-Capillary 192 (H) 70 - 99 mg/dL   Comment 1 Notify RN   Glucose, capillary     Status: Abnormal   Collection Time: 01/03/18 11:23 AM  Result Value Ref Range   Glucose-Capillary 162 (H) 70 - 99 mg/dL   Comment 1 Notify RN   Glucose, capillary     Status: Abnormal   Collection Time: 01/03/18  4:22 PM  Result Value Ref Range   Glucose-Capillary 205 (H) 70 - 99 mg/dL  Glucose, capillary     Status: Abnormal   Collection Time: 01/03/18  8:33 PM  Result Value Ref Range   Glucose-Capillary 116 (H) 70 - 99 mg/dL    Comment 1 Notify RN   Glucose, capillary     Status: Abnormal   Collection Time: 01/04/18  6:54 AM  Result Value Ref Range   Glucose-Capillary 249 (H) 70 - 99 mg/dL   Comment 1 Notify RN   Glucose, capillary     Status: Abnormal   Collection Time: 01/04/18 11:22 AM  Result Value Ref Range   Glucose-Capillary 197 (H) 70 - 99 mg/dL   Comment 1 Notify RN   Glucose, capillary     Status: Abnormal   Collection Time: 01/04/18  4:20 PM  Result Value Ref Range   Glucose-Capillary 131 (H) 70 - 99 mg/dL   Comment 1 Document in Chart     Blood Alcohol level:  Lab Results  Component Value Date   ETH <10 01/02/2018   ETH <10 66/44/0347    Metabolic Disorder Labs: Lab Results  Component Value Date   HGBA1C 12.5 (H) 12/26/2017   MPG 312.05 12/26/2017   No results found for: PROLACTIN Lab Results  Component Value Date   CHOL 215 (H) 12/26/2017   TRIG 179 (H) 12/26/2017   HDL 65 12/26/2017   CHOLHDL 3.3 12/26/2017   VLDL 36 12/26/2017   LDLCALC 114 (H) 12/26/2017   LDLCALC 79 10/09/2011    Physical Findings: AIMS:  , ,  ,  ,    CIWA:    COWS:     Musculoskeletal: Strength & Muscle Tone: within normal limits Gait & Station: normal Patient leans: N/A  Psychiatric Specialty Exam: Physical Exam  Nursing note and vitals reviewed. Psychiatric: Her speech is normal. Her mood appears anxious. She is withdrawn. Cognition and memory are normal. She expresses impulsivity. She exhibits a depressed mood. She expresses suicidal ideation.    Review of Systems  Neurological: Negative.   Psychiatric/Behavioral: Positive for depression, substance abuse and suicidal ideas.  All other systems reviewed and are negative.   Blood pressure 114/80, pulse (!) 106, temperature 98.6 F (37 C), temperature source Oral, resp. rate 18, height 5' 7.01" (1.702 m), weight 70.8 kg, SpO2 100 %.Body mass index is 24.44 kg/m.  General Appearance: Casual  Eye Contact:  Good  Speech:  Clear and  Coherent  Volume:  Normal  Mood:  Depressed  Affect:  Flat  Thought Process:  Goal Directed and Descriptions of Associations: Intact  Orientation:  Full (Time, Place, and Person)  Thought Content:  WDL  Suicidal Thoughts:  Yes.  with intent/plan  Homicidal Thoughts:  No  Memory:  Immediate;   Fair Recent;   Fair Remote;   Fair  Judgement:  Poor  Insight:  Lacking  Psychomotor Activity:  Normal  Concentration:  Concentration: Fair and Attention Span: Fair  Recall:  [employer] of Knowledge:  Fair  Language:  Fair  Akathisia:  No  Handed:  Right  AIMS (if indicated):     Assets:  Communication Skills Desire for Improvement Physical Health Resilience  ADL's:  Intact  Cognition:  WNL  Sleep:  Number of Hours: 7.5     Treatment Plan Summary: Daily contact with patient to assess and evaluate symptoms and progress in treatment and Medication management   Julie Lambert is a 52 year old female with a history of cocaine addiction and bipolar illness admitted for suicidal ideation in the context of relapse on cocaine.  #Suicidal ideation -patient is able to contract for safety in the hospital  #Mood -continue Seroquel 100 mg nightly -Trazodone 100 mg nightly  #Cocaine addiction -desires long term treatment but declines [facility] referral  #DM -continue Novolog 70/30 10 units BID -Metformin 1000 mg BID -Glipizide 2.5 mg BID -SSI, ADA diet  #  Constipation -bowel regimen  #Sciatica -Voltaren 50 mg BID  #Smoking cessation -nicotine patch is available  #Disposition -TBE  Orson Slick, MD 01/04/2018, 4:39 PM

## 2018-01-04 NOTE — Progress Notes (Signed)
Recreation Therapy Notes  INPATIENT RECREATION THERAPY ASSESSMENT  Patient Details Name: Darcia Lampi MRN: [id number] DOB: 10-[name]-1965 Today's Date: 01/04/2018       Information Obtained From: Patient  Able to Participate in Assessment/Interview: Yes  Patient Presentation: Responsive  Reason for Admission (Per Patient): Substance Abuse  Patient Stressors:    Coping Skills:   Other (Comment)(Shopping, Equities trader)  Leisure Interests (2+):  Social - Family, [employer] - Shopping mall  Frequency of Recreation/Participation: Biomedical engineer of Community Resources:  Yes  Community Resources:  [employer]  Current Use:    If no, Barriers?:    Expressed Interest in [place] of Residence:  Insurance underwriter  Patient Main Form of Transportation: Musician  Patient Strengths:  Helping others   Patient Identified Areas of Improvement:  Getting better  Patient Goal for Hospitalization:  attend groups and not isolate so much  Current SI (including self-harm):  No  Current HI:  No  Current AVH: No  Staff Intervention Plan: Group Attendance, Collaborate with Interdisciplinary Treatment Team  Consent to Intern Participation: N/A  Kaylie Ritter 01/04/2018, 2:16 PM

## 2018-01-04 NOTE — BHH Counselor (Signed)
CSW made referral to [facility], will follow up to see if pt is accepted into program.

## 2018-01-05 LAB — GLUCOSE, CAPILLARY
GLUCOSE-CAPILLARY: 209 mg/dL — AB (ref 70–99)
Glucose-Capillary: 136 mg/dL — ABNORMAL HIGH (ref 70–99)
Glucose-Capillary: 124 mg/dL — ABNORMAL HIGH (ref 70–99)
Glucose-Capillary: 130 mg/dL — ABNORMAL HIGH (ref 70–99)

## 2018-01-05 MED ORDER — DICLOFENAC SODIUM 25 MG PO TBEC
50.0000 mg | DELAYED_RELEASE_TABLET | Freq: Two times a day (BID) | ORAL | Status: DC
  Administered 2018-01-05 – 2018-01-11 (×13): 50 mg via ORAL
  Filled 2018-01-05 (×14): qty 2

## 2018-01-05 NOTE — Plan of Care (Signed)
Pt is A & O x4. Pt observed socializing in the milieu with staff and other pt's. Pt shows minimal treatment response today. Pt exhibits symptoms of anxiety. Pt expressed having sleep difficulties, fatigue, and described feeling irritable with difficulty concentrating. Pt denies SI/HI/AVH. Pt is compliant with her therapeutic treatment. Appetite is fair, mood and affect is depressed. Pt encouraged to develop coping skills and manage her stress ti improve self compassion. Q 15 minute safety checks maintained.  Problem: Education: Goal: Knowledge of [facility] Education information/materials will improve Outcome: Progressing   Problem: Safety: Goal: Periods of time without injury will increase Outcome: Progressing   Problem: Education: Goal: Utilization of techniques to improve thought processes will improve Outcome: Progressing Goal: Knowledge of the prescribed therapeutic regimen will improve Outcome: Progressing   Problem: Safety: Goal: Ability to disclose and discuss suicidal ideas will improve Outcome: Progressing Goal: Ability to identify and utilize support systems that promote safety will improve Outcome: Progressing

## 2018-01-05 NOTE — BHH Suicide Risk Assessment (Signed)
Julie Lambert INPATIENT:  Family/Significant Other Suicide Prevention Education  Suicide Prevention Education:  Education Completed; Julie Lambert, son [phone] has been identified by the patient as the family member/significant other with whom the patient will be residing, and identified as the person(s) who will aid the patient in the event of a mental health crisis (suicidal ideations/suicide attempt).  With written consent from the patient, the family member/significant other has been provided the following suicide prevention education, prior to the and/or following the discharge of the patient.  The suicide prevention education provided includes the following:  Suicide risk factors  Suicide prevention and interventions  National Suicide Hotline telephone number  [facility] assessment telephone number  [facility] Emergency Assistance [place] and/or Residential Mobile Crisis Unit telephone number  Request made of family/significant other to:  Remove weapons (e.g., guns, rifles, knives), all items previously/currently identified as safety concern.    Remove drugs/medications (over-the-counter, prescriptions, illicit drugs), all items previously/currently identified as a safety concern.  The family member/significant other verbalizes understanding of the suicide prevention education information provided.  The family member/significant other agrees to remove the items of safety concern listed above. Julie Lambert reports pt was living in an aunt's home and she has no access to guns or weapons in the home. He states his mother is battling an addiction and is in agreement with her receiving residential treatment and substance use counseling.   Julie Lambert 01/05/2018, 1:04 PM

## 2018-01-05 NOTE — BHH Counselor (Signed)
CSW contacted [facility] to inquire about bed availability for pt. CSW spoke with Apolonio Schneiders who reports there is a waiting list but pt name can be added to list. CSW added pt name to list, there are two individuals ahead of pt waiting on placement. Apolonio Schneiders reports ED or Op Admin will follow up with CSW.

## 2018-01-05 NOTE — BHH Suicide Risk Assessment (Signed)
[place] INPATIENT:  Family/Significant Other Suicide Prevention Education  Suicide Prevention Education:  Contact Attempts: Gillermo Murdoch, son [phone] has been identified by the patient as the family member/significant other with whom the patient will be residing, and identified as the person(s) who will aid the patient in the event of a mental health crisis.  With written consent from the patient, two attempts were made to provide suicide prevention education, prior to and/or following the patient's discharge.  We were unsuccessful in providing suicide prevention education.  A suicide education pamphlet was given to the patient to share with family/significant other.  Date and time of first attempt: 01-05-18 10:13am Date and time of second attempt:  Yvette Rack 01/05/2018, 10:13 AM

## 2018-01-05 NOTE — Plan of Care (Signed)
Patient endorses depression and some anxiety regarding aftercare plans. BS monitored and medicated according to sliding scale. Frequents the nursing station with several requests. Patient is compliant with her treatment plan.Denies SI/HI/AVH at this time. Milieu remains safe with q 15 minute safety checks. Will continue to monitor.

## 2018-01-05 NOTE — Progress Notes (Addendum)
[facility] MD Progress Note  01/06/2018 3:08 PM Julie Lambert  MRN:  [id number]  Subjective:    Julie Lambert has a history of cocaine and alcohol addiction and mood instability admitted for suicidal ideation in the context of continuous use and severe social stressors. She was recently hospitalized here and transferred to [place] for rehab. She left early very dissatisfied. She believes that her attitude has changed and is ready for fresh start. Desires long term treatment and was accepted to [facility] on Monday. Over the holiday, she developed abdominal pain investigated by medicine. No pathology found. Gave a dose of Toradol 60 mg today.   Julie Lambert has been better, no longer suicidal. She is rather tearful today. First tears of anxiety and sadness but later of joy as she interviewed and was accepted to [facility] program next week, She does not wish any medication changes. Sleep and appeite are fair. She has been participating in programing.  Principal Problem: Bipolar I disorder, most recent episode depressed, severe without psychotic features ([place]) Diagnosis: Principal Problem:   Bipolar I disorder, most recent episode depressed, severe without psychotic features ([place]) Active Problems:   Diabetes mellitus without complication ([place])   Cocaine use disorder, moderate, dependence ([place])   Tobacco use disorder   Suicidal ideation  Total Time spent with patient: 20 minutes  Past Psychiatric History: mood instability, substance abuse  Past Medical History:  Past Medical History:  Diagnosis Date  . Bipolar 2 disorder ([place])   . Diabetes mellitus without complication ([employer])   . Diverticulitis   . Diverticulosis   . Uterine fibroid     Past Surgical History:  Procedure Laterality Date  . BACK SURGERY    . CHOLECYSTECTOMY    . LUMBAR SPINE SURGERY     Family History:  Family History  Problem Relation Age of Onset  . CAD Mother   . Diabetes Father    Family Psychiatric  History: none  reported Social History:  Social History   Substance and Sexual Activity  Alcohol Use Yes   Comment: last use 12/03/2017     Social History   Substance and Sexual Activity  Drug Use Yes  . Types: Cocaine, Marijuana    Social History   Socioeconomic History  . Marital status: Divorced    Spouse name: Not on file  . Number of children: Not on file  . Years of education: Not on file  . Highest education level: Not on file  Occupational History  . Not on file  Social Needs  . Financial resource strain: Not on file  . Food insecurity:    Worry: Not on file    Inability: Not on file  . Transportation needs:    Medical: Not on file    Non-medical: Not on file  Tobacco Use  . Smoking status: Current Every Day Smoker    Packs/day: 0.50    Years: 35.00    Pack years: 17.50    Types: Cigarettes  . Smokeless tobacco: Never Used  Substance and Sexual Activity  . Alcohol use: Yes    Comment: last use 12/03/2017  . Drug use: Yes    Types: Cocaine, Marijuana  . Sexual activity: Yes  Lifestyle  . Physical activity:    Days per week: Not on file    Minutes per session: Not on file  . Stress: Not on file  Relationships  . Social connections:    Talks on phone: Not on file    Gets together:  Not on file    Attends religious service: Not on file    Active member of club or organization: Not on file    Attends meetings of clubs or organizations: Not on file    Relationship status: Not on file  Other Topics Concern  . Not on file  Social History Narrative  . Not on file   Additional Social History:                         Sleep: Fair  Appetite:  Fair  Current Medications: Current Facility-Administered Medications  Medication Dose Route Frequency Provider Last Rate Last Dose  . acetaminophen (TYLENOL) tablet 650 mg  650 mg Oral Q6H PRN Clapacs, [name], MD   650 mg at 01/04/18 1510  . alum & mag hydroxide-simeth (MAALOX/MYLANTA) 200-200-20 MG/5ML suspension  30 mL  30 mL Oral Q4H PRN Clapacs, [name], MD      . diclofenac (VOLTAREN) EC tablet 50 mg  50 mg Oral BID ,  B, MD   50 mg at 01/06/18 0813  . glipiZIDE (GLUCOTROL) tablet 2.5 mg  2.5 mg Oral BID AC Clapacs, [name], MD   2.5 mg at 01/06/18 0813  . hydrOXYzine (ATARAX/VISTARIL) tablet 25 mg  25 mg Oral TID PRN Clapacs, [name], MD   25 mg at 01/06/18 0817  . insulin aspart (novoLOG) injection 0-9 Units  0-9 Units Subcutaneous TID WC Clapacs, [name], MD   1 Units at 01/06/18 1139  . insulin aspart protamine- aspart (NOVOLOG MIX 70/30) injection 10 Units  10 Units Subcutaneous BID WC Clapacs, [name], MD   10 Units at 01/06/18 [phone]  . magnesium hydroxide (MILK OF MAGNESIA) suspension 30 mL  30 mL Oral Daily PRN Clapacs, [name], MD   30 mL at 01/04/18 1510  . metFORMIN (GLUCOPHAGE) tablet 1,000 mg  1,000 mg Oral BID WC Clapacs, [name], MD   1,000 mg at 01/06/18 0813  . nicotine polacrilex (NICORETTE) gum 2 mg  2 mg Oral PRN Clapacs, [name], MD   2 mg at 01/06/18 0818  . polyvinyl alcohol (LIQUIFILM TEARS) 1.4 % ophthalmic solution 1 drop  1 drop Both Eyes PRN [name], RPH   1 drop at 01/05/18 1134  . QUEtiapine (SEROQUEL) tablet 100 mg  100 mg Oral QHS Clapacs, [name], MD   100 mg at 01/05/18 2124  . senna-docusate (Senokot-S) tablet 1 tablet  1 tablet Oral QHS ,  B, MD   1 tablet at 01/05/18 2124  . traZODone (DESYREL) tablet 100 mg  100 mg Oral QHS PRN Clapacs, [name], MD   100 mg at 01/05/18 2126    Lab Results:  Results for orders placed or performed during the hospital encounter of 01/02/18 (from the past 48 hour(s))  Glucose, capillary     Status: Abnormal   Collection Time: 01/04/18  4:20 PM  Result Value Ref Range   Glucose-Capillary 131 (H) 70 - 99 mg/dL   Comment 1 Document in Chart   Glucose, capillary     Status: Abnormal   Collection Time: 01/04/18  8:05 PM  Result Value Ref Range   Glucose-Capillary 118 (H) 70 - 99 mg/dL  Glucose,  capillary     Status: Abnormal   Collection Time: 01/05/18  6:54 AM  Result Value Ref Range   Glucose-Capillary 209 (H) 70 - 99 mg/dL   Comment 1 Notify RN   Glucose, capillary  Status: Abnormal   Collection Time: 01/05/18 11:21 AM  Result Value Ref Range   Glucose-Capillary 136 (H) 70 - 99 mg/dL  Glucose, capillary     Status: Abnormal   Collection Time: 01/05/18  4:17 PM  Result Value Ref Range   Glucose-Capillary 124 (H) 70 - 99 mg/dL   Comment 1 Notify RN   Glucose, capillary     Status: Abnormal   Collection Time: 01/05/18  8:36 PM  Result Value Ref Range   Glucose-Capillary 130 (H) 70 - 99 mg/dL  Glucose, capillary     Status: Abnormal   Collection Time: 01/06/18  7:04 AM  Result Value Ref Range   Glucose-Capillary 171 (H) 70 - 99 mg/dL  Glucose, capillary     Status: Abnormal   Collection Time: 01/06/18 11:37 AM  Result Value Ref Range   Glucose-Capillary 139 (H) 70 - 99 mg/dL    Blood Alcohol level:  Lab Results  Component Value Date   ETH <10 01/02/2018   ETH <10 53/64/6803    Metabolic Disorder Labs: Lab Results  Component Value Date   HGBA1C 12.5 (H) 12/26/2017   MPG 312.05 12/26/2017   No results found for: PROLACTIN Lab Results  Component Value Date   CHOL 215 (H) 12/26/2017   TRIG 179 (H) 12/26/2017   HDL 65 12/26/2017   CHOLHDL 3.3 12/26/2017   VLDL 36 12/26/2017   LDLCALC 114 (H) 12/26/2017   LDLCALC 79 10/09/2011    Physical Findings: AIMS:  , ,  ,  ,    CIWA:    COWS:     Musculoskeletal: Strength & Muscle Tone: within normal limits Gait & Station: normal Patient leans: N/A  Psychiatric Specialty Exam: Physical Exam  Nursing note and vitals reviewed. Psychiatric: Her speech is normal. Thought content normal. Her affect is blunt and labile. She is withdrawn. Cognition and memory are normal. She expresses impulsivity. She exhibits a depressed mood.    Review of Systems  Neurological: Negative.   Psychiatric/Behavioral: Positive  for substance abuse.  All other systems reviewed and are negative.   Blood pressure 134/87, pulse 93, temperature 98.6 F (37 C), temperature source Oral, resp. rate 18, height 5' 7.01" (1.702 m), weight 70.8 kg, SpO2 100 %.Body mass index is 24.44 kg/m.  General Appearance: Casual  Eye Contact:  Good  Speech:  Clear and Coherent  Volume:  Normal  Mood:  Depressed and Irritable  Affect:  Labile  Thought Process:  Goal Directed and Descriptions of Associations: Intact  Orientation:  Full (Time, Place, and Person)  Thought Content:  WDL  Suicidal Thoughts:  No  Homicidal Thoughts:  No  Memory:  Immediate;   Fair Recent;   Fair Remote;   Fair  Judgement:  Poor  Insight:  Lacking  Psychomotor Activity:  Normal  Concentration:  Concentration: Fair and Attention Span: Fair  Recall:  [employer] of Knowledge:  Fair  Language:  Fair  Akathisia:  No  Handed:  Right  AIMS (if indicated):     Assets:  Communication Skills Desire for Improvement Physical Health Resilience  ADL's:  Intact  Cognition:  WNL  Sleep:  Number of Hours: 7.45     Treatment Plan Summary: Daily contact with patient to assess and evaluate symptoms and progress in treatment and Medication management   Julie Lambert is a 52 year old female with a history of cocaine addiction and bipolar disorder admitted for suicidal ideation in the context of relapse on cocaine.  Pt reports  motivation to remain abstinent and is seeking inpatient treatment for substance abuse.    #Suicidal ideation, resolved -patient is able to contract for safety in the hospital  #Mood (History of Bipolar disorder), improved -continue Seroquel 100 mg nightly -Trazodone 100 mg qhs prn sleep  #Cocaine addiction -desires long-term esidential substance abuse treatment  #DM -continue Novolog 70/30 10 units BID with meals -Metformin 1000 mg BID with meals -Glipizide 2.5 mg BID with meals -sliding scale insulin, ADA  diet  #Constipation -bowel regimen  #Sciatica -Voltaren 50 mg BID  #Smoking cessation -nicotine patch is available  #Disposition -discharge to [facility] program next week -follow up with the local provider -30 day supply of medication will be provided -PPD placed  Orson Slick, MD 01/06/2018, 3:08 PM

## 2018-01-05 NOTE — Progress Notes (Signed)
Recreation Therapy Notes  Date: 01/05/18  Time: 9:30 am  Location: Craft Room  Behavioral response: Appropriate, Tearful  Intervention Topic: Relaxation   Discussion/Intervention:  Group content today was focused on relaxation. The group defined relaxation and identified healthy ways to relax. Individuals expressed how much time they spend relaxing. Patients expressed how much their life would be if they did not make time for themselves to relax. The group stated ways they could improve their relaxation techniques in the future.  Individuals participated in the intervention "Time to Relax" where they had a chance to experience different relaxation techniques.  Clinical Observations/Feedback:  Patient came to group and explained that if you do not relax you will become burnt out. She identified stress as a reason she can not relax. Participant was very tearful during the beginning of group. Individual was social with peers and staff while participating in the intervention.  Julie Lambert LRT/CTRS         Hien Cunliffe 01/05/2018 11:23 AM

## 2018-01-05 NOTE — Progress Notes (Addendum)
Patient alert and oriented x 4,affctt is blunted mood is irritable, was tearful and upset  about her current substance abuse  problem and the way her family members have treated her. Patient stated " I just don't feel good"  Patient vital signs WNL, respiration non labored, assisted to a sitting position, patient was given emotional support she expressed to writer that she probably expressing some anxiety.  Patient denies SI/HI/AVH, was encouraged to use coping mechanisms learnt in groups and medication was also given which was effective. Patient later was assisted back to her room, 15 minutes safety checks maintained will continue to monitor.

## 2018-01-05 NOTE — [facility] Group Notes (Signed)
Feelings Around Diagnosis 01/05/2018 1PM  Type of Therapy/Topic:  Group Therapy:  Feelings about Diagnosis  Participation Level:  Active   Description of Group:   This group will allow patients to explore their thoughts and feelings about diagnoses they have received. Patients will be guided to explore their level of understanding and acceptance of these diagnoses. Facilitator will encourage patients to process their thoughts and feelings about the reactions of others to their diagnosis and will guide patients in identifying ways to discuss their diagnosis with significant others in their lives. This group will be process-oriented, with patients participating in exploration of their own experiences, giving and receiving support, and processing challenge from other group members.   Therapeutic Goals: 1. Patient will demonstrate understanding of diagnosis as evidenced by identifying two or more symptoms of the disorder 2. Patient will be able to express two feelings regarding the diagnosis 3. Patient will demonstrate their ability to communicate their needs through discussion and/or role play  Summary of Patient Progress: Actively and appropriately engaged in the group. Patient was able to provide support and validation to other group members.Patient practiced active listening when interacting with the facilitator and other group members. Patient shared with group some of her challenges with substance use and avoiding unhealthy relationships to prevent relapse.  Patient is still in the process of obtaining treatment goals.        Therapeutic Modalities:   Cognitive Behavioral Therapy Brief Therapy Feelings Identification    Yvette Rack, LCSW 01/05/2018 3:49 PM

## 2018-01-05 NOTE — Progress Notes (Signed)
[facility] MD Progress Note  01/05/2018 10:50 AM Selia Wareing  MRN:  [id number]  Subjective:    Ms. Garvey was seen for follow up. She states "I have to do something about these mood swings..I'm all over the place."  She reports that after she awakens in the mornings, she feels sad and cries due to her current psychosocial stressors.  She reports discord with her adult son. She also reports an unstable living environment.  Pt states she often thinks "how did this happen to me...how did I let it get this bad," when discussing her current situation and cocaine use.  She states that she started using crack/cocaine recreationally in the 1980's.  She was married and raising a family, but she would sneak off and use when she and her husband were having marital discord.  Pt states she and her husband eventually divorced.  She continued to use recreationally until one day her use started spiraling out of control.  Pt states she was craving cocaine on a daily basis.  Pt also reports alcohol use.  She is currently unemployed.  She was provided a list of halfway houses to contact in [place], but pt states she's unsure of how she would even pay to stay there.  She's hopeful to get into a residential substance abuse program b/c she feels motivated to start fresh.  She has a 7 month grand-daughter.  She also wants to get on her feet for herself.  Pt and I discussed her brief time at [facility], but she states she's in a more positive state of mind now and is truly ready to maintain abstinence from illicit substances and wants a second chance to succeed in a residential program.    Pt reports decreased sleep, but doesn't want her meds adjusted right now.  During our session, we also discussed adjusting her medications for more mood stabilization, but pt prefers to keep her seroquel at 100 mg nightly for now. Pt states past manic episodes included increase in goal-directed activity, mood lability and  irritability, difficulty finishing tasks.  Additionally, pt discusses how she lost over 100 lbs and doesn't want to gain weight and she knows seroquel can increase her appetite.  Pt's blood sugars have improved since admission.  Blood sugars have been between 118 and 209 over the past 24 hours, compared to going up to the mid 200's earlier in hospitalization.   Pt and I also engage in some brief psychotherapy using CBT techniques.   We discuss how thoughts influence emotions which impact actions.  Pt appropriately engages in a CBT exercise and states that she is going to start thinking more positive.    Pt denies SI, HI, AH, VH, paranoia.     Principal Problem: Bipolar I disorder, most recent episode depressed, severe without psychotic features ([place]) Diagnosis: Principal Problem:   Bipolar I disorder, most recent episode depressed, severe without psychotic features ([place]) Active Problems:   Diabetes mellitus without complication ([place])   Cocaine use disorder, moderate, dependence ([place])   Tobacco use disorder   Suicidal ideation  Total Time spent with patient, providing therapy, reviewing patient's chart, and discussing plan of care with the patient's treatment team: 45 minutes  Past Psychiatric History: substance abuse, bipolar; see H&P  Past Medical History:  Past Medical History:  Diagnosis Date  . Bipolar 2 disorder ([place])   . Diabetes mellitus without complication ([place])   . Diverticulitis   . Diverticulosis   . Uterine fibroid  Past Surgical History:  Procedure Laterality Date  . BACK SURGERY    . CHOLECYSTECTOMY    . LUMBAR SPINE SURGERY     Family History:  Family History  Problem Relation Age of Onset  . CAD Mother   . Diabetes Father    Family Psychiatric  History: see H&P Social History:  Social History   Substance and Sexual Activity  Alcohol Use Yes   Comment: last use 12/03/2017     Social History   Substance and Sexual Activity  Drug Use Yes  .  Types: Cocaine, Marijuana    Social History   Socioeconomic History  . Marital status: Divorced    Spouse name: Not on file  . Number of children: Not on file  . Years of education: Not on file  . Highest education level: Not on file  Occupational History  . Not on file  Social Needs  . Financial resource strain: Not on file  . Food insecurity:    Worry: Not on file    Inability: Not on file  . Transportation needs:    Medical: Not on file    Non-medical: Not on file  Tobacco Use  . Smoking status: Current Every Day Smoker    Packs/day: 0.50    Years: 35.00    Pack years: 17.50    Types: Cigarettes  . Smokeless tobacco: Never Used  Substance and Sexual Activity  . Alcohol use: Yes    Comment: last use 12/03/2017  . Drug use: Yes    Types: Cocaine, Marijuana  . Sexual activity: Yes  Lifestyle  . Physical activity:    Days per week: Not on file    Minutes per session: Not on file  . Stress: Not on file  Relationships  . Social connections:    Talks on phone: Not on file    Gets together: Not on file    Attends religious service: Not on file    Active member of club or organization: Not on file    Attends meetings of clubs or organizations: Not on file    Relationship status: Not on file  Other Topics Concern  . Not on file  Social History Narrative  . Not on file   Additional Social History: see H&P  Sleep: Fair  Appetite:  Fair  Current Medications: Current Facility-Administered Medications  Medication Dose Route Frequency Provider Last Rate Last Dose  . acetaminophen (TYLENOL) tablet 650 mg  650 mg Oral Q6H PRN Clapacs, [name], MD   650 mg at 01/04/18 1510  . albuterol (PROVENTIL) (2.5 MG/3ML) 0.083% nebulizer solution 2.5 mg  2.5 mg Inhalation Q6H PRN Clapacs, [name], MD      . alum & mag hydroxide-simeth (MAALOX/MYLANTA) 200-200-20 MG/5ML suspension 30 mL  30 mL Oral Q4H PRN Clapacs, [name], MD      . clotrimazole (GYNE-LOTRIMIN) vaginal cream 1  Applicatorful  1 Applicatorful Vaginal QHS Clapacs, [name], MD   1 Applicatorful at 86/76/19 2148  . diclofenac (VOLTAREN) EC tablet 50 mg  50 mg Oral BID [name], [name], MD   50 mg at 01/05/18 0807  . glipiZIDE (GLUCOTROL) tablet 2.5 mg  2.5 mg Oral BID AC Clapacs, [name], MD   2.5 mg at 01/05/18 0806  . hydrOXYzine (ATARAX/VISTARIL) tablet 25 mg  25 mg Oral TID PRN Clapacs, [name], MD   25 mg at 01/05/18 0809  . insulin aspart (novoLOG) injection 0-9 Units  0-9 Units Subcutaneous TID WC Clapacs,  [name], MD   3 Units at 01/05/18 0805  . insulin aspart protamine- aspart (NOVOLOG MIX 70/30) injection 10 Units  10 Units Subcutaneous BID WC Clapacs, [name], MD   10 Units at 01/05/18 0805  . magnesium hydroxide (MILK OF MAGNESIA) suspension 30 mL  30 mL Oral Daily PRN Clapacs, [name], MD   30 mL at 01/04/18 1510  . metFORMIN (GLUCOPHAGE) tablet 1,000 mg  1,000 mg Oral BID WC Clapacs, [name], MD   1,000 mg at 01/05/18 0806  . nicotine polacrilex (NICORETTE) gum 2 mg  2 mg Oral PRN Clapacs, [name], MD   2 mg at 01/05/18 0809  . polyethylene glycol (MIRALAX / GLYCOLAX) packet 17 g  17 g Oral Daily [name], [name], MD   17 g at 01/05/18 0807  . polyvinyl alcohol (LIQUIFILM TEARS) 1.4 % ophthalmic solution 1 drop  1 drop Both Eyes PRN [name], RPH   1 drop at 01/04/18 2009  . QUEtiapine (SEROQUEL) tablet 100 mg  100 mg Oral QHS Clapacs, [name], MD   100 mg at 01/04/18 2146  . senna-docusate (Senokot-S) tablet 1 tablet  1 tablet Oral QHS [name], [name], MD   1 tablet at 01/04/18 2147  . traZODone (DESYREL) tablet 100 mg  100 mg Oral QHS PRN Clapacs, [name], MD   100 mg at 01/04/18 2146    Lab Results:  Results for orders placed or performed during the hospital encounter of 01/02/18 (from the past 48 hour(s))  Glucose, capillary     Status: Abnormal   Collection Time: 01/03/18 11:23 AM  Result Value Ref Range   Glucose-Capillary 162 (H) 70 - 99 mg/dL   Comment 1 Notify RN    Glucose, capillary     Status: Abnormal   Collection Time: 01/03/18  4:22 PM  Result Value Ref Range   Glucose-Capillary 205 (H) 70 - 99 mg/dL  Glucose, capillary     Status: Abnormal   Collection Time: 01/03/18  8:33 PM  Result Value Ref Range   Glucose-Capillary 116 (H) 70 - 99 mg/dL   Comment 1 Notify RN   Glucose, capillary     Status: Abnormal   Collection Time: 01/04/18  6:54 AM  Result Value Ref Range   Glucose-Capillary 249 (H) 70 - 99 mg/dL   Comment 1 Notify RN   Glucose, capillary     Status: Abnormal   Collection Time: 01/04/18 11:22 AM  Result Value Ref Range   Glucose-Capillary 197 (H) 70 - 99 mg/dL   Comment 1 Notify RN   Glucose, capillary     Status: Abnormal   Collection Time: 01/04/18  4:20 PM  Result Value Ref Range   Glucose-Capillary 131 (H) 70 - 99 mg/dL   Comment 1 Document in Chart   Glucose, capillary     Status: Abnormal   Collection Time: 01/04/18  8:05 PM  Result Value Ref Range   Glucose-Capillary 118 (H) 70 - 99 mg/dL  Glucose, capillary     Status: Abnormal   Collection Time: 01/05/18  6:54 AM  Result Value Ref Range   Glucose-Capillary 209 (H) 70 - 99 mg/dL   Comment 1 Notify RN     Blood Alcohol level:  Lab Results  Component Value Date   ETH <10 01/02/2018   ETH <10 57/32/2025    Metabolic Disorder Labs: Lab Results  Component Value Date   HGBA1C 12.5 (H) 12/26/2017   MPG 312.05 12/26/2017  No results found for: PROLACTIN Lab Results  Component Value Date   CHOL 215 (H) 12/26/2017   TRIG 179 (H) 12/26/2017   HDL 65 12/26/2017   CHOLHDL 3.3 12/26/2017   VLDL 36 12/26/2017   LDLCALC 114 (H) 12/26/2017   LDLCALC 79 10/09/2011    Physical Findings: AIMS:  , ,  ,  ,    CIWA:    COWS:     Musculoskeletal: Strength & Muscle Tone: within normal limits Gait & Station: normal Patient leans: N/A  Psychiatric Specialty Exam: Physical Exam  Nursing note and vitals reviewed. Respiratory: Effort normal.  Psychiatric: Her  speech is normal. Her mood appears anxious. Cognition and memory are normal. She expresses impulsivity. She exhibits a depressed mood.    Review of Systems  Neurological: Negative.   Psychiatric/Behavioral: Positive for depression and substance abuse. Negative for suicidal ideas. The patient is nervous/anxious.   All other systems reviewed and are negative.   Blood pressure (!) 118/91, pulse (!) 112, temperature 98.4 F (36.9 C), temperature source Oral, resp. rate 18, height 5' 7.01" (1.702 m), weight 70.8 kg, SpO2 99 %.Body mass index is 24.44 kg/m.  General Appearance: Casual  Eye Contact:  Good  Speech:  Clear and Coherent  Volume:  Normal  Mood:  Depressed, but feeling more hopeful after session today  Affect:  Full Range  Thought Process:  Goal Directed and Descriptions of Associations: Intact  Orientation:  Full (Time, Place, and Person)  Thought Content:  Logical  Suicidal Thoughts:  No (pt denies SI)  Homicidal Thoughts:  No (pt denies HI)  Memory:  Immediate;   Fair Recent;   Fair Remote;   Fair  Judgement:  Other:  gaining  Insight:  gaining  Psychomotor Activity:  Normal  Concentration:  Concentration: Fair and Attention Span: Fair  Recall:  [employer] of Knowledge:  Fair  Language:  Fair  Akathisia:  No  Handed:  Right  AIMS (if indicated):     Assets:  Communication Skills Desire for Improvement Physical Health Resilience  ADL's:  Intact  Cognition:  WNL  Sleep:  Number of Hours: 6.5     Treatment Plan Summary: Daily contact with patient to assess and evaluate symptoms and progress in treatment and Medication management   Ms. Peery is a 52 year old female with a history of cocaine addiction and bipolar disorder admitted for suicidal ideation in the context of relapse on cocaine.  Pt reports motivation to remain abstinent and is seeking inpatient treatment for substance abuse.    #Suicidal ideation,  -patient is able to contract for safety in the  hospital -pt denies SI today  #Mood (History of Bipolar disorder) -continue Seroquel 100 mg nightly -Trazodone 100 mg qhs prn sleep  #Cocaine addiction -desires residential substance abuse treatment  #DM -continue Novolog 70/30 10 units BID with meals -Metformin 1000 mg BID with meals -Glipizide 2.5 mg BID with meals -sliding scale insulin, ADA diet  #Constipation -bowel regimen  #Sciatica -Voltaren 50 mg BID  #Smoking cessation -nicotine patch is available  #Disposition -referral to inpatient substance abuse treatment program  Tennis Ship, MD 01/05/2018, 10:50 AM

## 2018-01-05 NOTE — BHH Counselor (Signed)
CSW received VM from [facility], intake coordinator at [facility] stating that pt does not meet the criteria for admission.

## 2018-01-05 NOTE — BHH Counselor (Signed)
CSW spoke with Celesta Aver, operations administrator at [facility]; faxed documents to him to be reviewed.

## 2018-01-05 NOTE — Plan of Care (Signed)
  Problem: Safety: Goal: Periods of time without injury will increase Outcome: Progressing  Patient continues to be safe on unit no injury noted.

## 2018-01-06 LAB — GLUCOSE, CAPILLARY
Glucose-Capillary: 171 mg/dL — ABNORMAL HIGH (ref 70–99)
Glucose-Capillary: 139 mg/dL — ABNORMAL HIGH (ref 70–99)
Glucose-Capillary: 82 mg/dL (ref 70–99)
Glucose-Capillary: 97 mg/dL (ref 70–99)

## 2018-01-06 MED ORDER — TUBERCULIN PPD 5 UNIT/0.1ML ID SOLN
5.0000 [IU] | Freq: Once | INTRADERMAL | Status: AC
  Administered 2018-01-06: 5 [IU] via INTRADERMAL
  Filled 2018-01-06: qty 0.1

## 2018-01-06 MED ORDER — DICLOFENAC SODIUM 50 MG PO TBEC: 50.0000 mg | DELAYED_RELEASE_TABLET | Freq: Two times a day (BID) | ORAL | 1 refills | Status: DC

## 2018-01-06 MED ORDER — TRAZODONE HCL 100 MG PO TABS: 100.0000 mg | ORAL_TABLET | Freq: Every evening | ORAL | 1 refills | Status: DC | PRN

## 2018-01-06 MED ORDER — QUETIAPINE FUMARATE 100 MG PO TABS: 100.0000 mg | ORAL_TABLET | Freq: Every day | ORAL | 1 refills | Status: DC

## 2018-01-06 MED ORDER — SENNOSIDES-DOCUSATE SODIUM 8.6-50 MG PO TABS: 1.0000 | ORAL_TABLET | Freq: Every day | ORAL | 1 refills | Status: DC

## 2018-01-06 MED ORDER — INSULIN ASPART PROT & ASPART (70-30 MIX) 100 UNIT/ML ~~LOC~~ SUSP: 10.0000 [IU] | Freq: Two times a day (BID) | SUBCUTANEOUS | 0 refills | Status: DC

## 2018-01-06 MED ORDER — METFORMIN HCL 1000 MG PO TABS: 1000.0000 mg | ORAL_TABLET | Freq: Two times a day (BID) | ORAL | 1 refills | Status: DC

## 2018-01-06 MED ORDER — HYDROXYZINE HCL 25 MG PO TABS: 25.0000 mg | ORAL_TABLET | Freq: Three times a day (TID) | ORAL | 1 refills | Status: DC | PRN

## 2018-01-06 MED ORDER — BLOOD GLUCOSE METER KIT: PACK | 0 refills | Status: DC

## 2018-01-06 MED ORDER — GLIPIZIDE 5 MG PO TABS: 2.5000 mg | ORAL_TABLET | Freq: Two times a day (BID) | ORAL | 1 refills | Status: DC

## 2018-01-06 NOTE — Plan of Care (Signed)
Patient is alert and oriented X 4. Patient very tearful today stating, " I am just tired of doing this over and over relapsing on drugs." Patient informed nurse of wanting to attend a residential treatment facility. Patient states, "They are trying to get me into [facility] but I have not heard anything yet. Patient complains of anxiety and sullen, vistaril given to help manage anxiety. Patient states, " I have no where to go." Nurse offered emotional support. Safety checks Q 15 minutes to continue. Problem: Education: Goal: Knowledge of [place] [facility] Education information/materials will improve Outcome: Progressing   Problem: Safety: Goal: Periods of time without injury will increase Outcome: Progressing   Problem: Education: Goal: Utilization of techniques to improve thought processes will improve Outcome: Progressing Goal: Knowledge of the prescribed therapeutic regimen will improve Outcome: Progressing   Problem: Safety: Goal: Ability to disclose and discuss suicidal ideas will improve Outcome: Not Progressing Goal: Ability to identify and utilize support systems that promote safety will improve Outcome: Not Progressing

## 2018-01-06 NOTE — Progress Notes (Signed)
Patient received telephone call from [facility] for a telephone interview today. Nurse will follow up on how patient feels interview went.

## 2018-01-06 NOTE — Plan of Care (Signed)
Patient compliant with medications, per MD orders and acts appropriately with peers and staff. Patient got accepted to [facility] and is excited to get there and start her therapy.   Problem: Education: Goal: Knowledge of the prescribed therapeutic regimen will improve Outcome: Progressing   Problem: Safety: Goal: Ability to identify and utilize support systems that promote safety will improve Outcome: Progressing

## 2018-01-06 NOTE — Progress Notes (Signed)
Recreation Therapy Notes  Date: 01/06/2018  Time: 9:30 am   Location: Craft room   Behavioral response: N/A   Intervention Topic: Problem Solving  Discussion/Intervention: Patient did not attend group.   Clinical Observations/Feedback:  Patient did not attend group.   Janiqua Friscia LRT/CTRS        Konstance Happel 01/06/2018 11:13 AM

## 2018-01-06 NOTE — Progress Notes (Signed)
Patient states interview went well with [facility] today.

## 2018-01-06 NOTE — BHH Counselor (Signed)
CSW submitted charitable foundation request to assist pt with paying admissions fee at [facility].

## 2018-01-06 NOTE — Progress Notes (Signed)
D - Patient was out in the milieu upon arrival to the unit. Patient was pleasant during assessment and stated, "I got accepted into [facility] and I am excited and nervous about starting. I have heard they have a beautiful house, and I feel this is going to be good for me." Patient asked for information on [facility] and this Probation officer provided a pamphlet on it for her from their website. Patient denies SI/HI/AVH and pain. Patient stated her depression was high today but stated, "Its so much better now." Patient stated her anxiety was a 6/10.   A - Patient was compliant with medication administration per MD orders. Patient was given education. Patient given support and encouragement.   R - Patient monitored Q 15 minutes for safety per unit protocol. Patient informed to let staff know if there are any issues or problems. Patient remains safe on the unit.

## 2018-01-06 NOTE — BHH Counselor (Signed)
CSW received VM from Eustaquio Maize at [facility] stating pt will need a physician's statement and tb test completed prior to admission. CSW relayed this information to the physician. CSW also informed physician Ms. Moshe Cipro states pt can be admitted on either 12/2 or 12/3.

## 2018-01-07 LAB — CBC
HCT: 38.4 % (ref 36.0–46.0)
HEMOGLOBIN: 12.7 g/dL (ref 12.0–15.0)
MCH: 30.5 pg (ref 26.0–34.0)
MCHC: 33.1 g/dL (ref 30.0–36.0)
MCV: 92.3 fL (ref 80.0–100.0)
PLATELETS: 245 10*3/uL (ref 150–400)
RBC: 4.16 MIL/uL (ref 3.87–5.11)
RDW: 12 % (ref 11.5–15.5)
WBC: 8.8 10*3/uL (ref 4.0–10.5)
nRBC: 0 % (ref 0.0–0.2)

## 2018-01-07 LAB — GLUCOSE, CAPILLARY
Glucose-Capillary: 191 mg/dL — ABNORMAL HIGH (ref 70–99)
Glucose-Capillary: 110 mg/dL — ABNORMAL HIGH (ref 70–99)
Glucose-Capillary: 88 mg/dL (ref 70–99)
Glucose-Capillary: 97 mg/dL (ref 70–99)

## 2018-01-07 LAB — COMPREHENSIVE METABOLIC PANEL
ALT: 15 U/L (ref 0–44)
AST: 15 U/L (ref 15–41)
Albumin: 3.3 g/dL — ABNORMAL LOW (ref 3.5–5.0)
Alkaline Phosphatase: 71 U/L (ref 38–126)
Anion gap: 8 (ref 5–15)
BUN: 15 mg/dL (ref 6–20)
CHLORIDE: 104 mmol/L (ref 98–111)
CO2: 27 mmol/L (ref 22–32)
CREATININE: 0.58 mg/dL (ref 0.44–1.00)
Calcium: 9.1 mg/dL (ref 8.9–10.3)
GFR calc Af Amer: >60 mL/min (ref >60)
Glucose, Bld: 128 mg/dL — ABNORMAL HIGH (ref 70–99)
Potassium: 4.4 mmol/L (ref 3.5–5.1)
Sodium: 139 mmol/L (ref 135–145)
Total Bilirubin: 0.4 mg/dL (ref 0.3–1.2)
Total Protein: 6.3 g/dL — ABNORMAL LOW (ref 6.5–8.1)

## 2018-01-07 LAB — LIPASE, BLOOD: LIPASE: 32 U/L (ref 11–51)

## 2018-01-07 MED ORDER — CITALOPRAM HYDROBROMIDE 20 MG PO TABS
20.0000 mg | ORAL_TABLET | Freq: Every day | ORAL | Status: DC
  Administered 2018-01-07 – 2018-01-11 (×5): 20 mg via ORAL
  Filled 2018-01-07 (×5): qty 1

## 2018-01-07 NOTE — Plan of Care (Signed)
Patient compliant with medication administration per MD orders and procedures on the unit. Patient stated to this writer, "I am ready to get my act together when I discharge to a rehab place on Monday."  Problem: Education: Goal: Knowledge of the prescribed therapeutic regimen will improve Outcome: Progressing   Problem: Safety: Goal: Ability to identify and utilize support systems that promote safety will improve Outcome: Progressing

## 2018-01-07 NOTE — Consult Note (Signed)
[facility] NAME: Julie Lambert    MR#:  [id number]  DATE OF BIRTH:  1965-08-02  DATE OF ADMISSION:  01/02/2018  PRIMARY CARE PHYSICIAN: Patient, No Pcp Per   REQUESTING/REFERRING PHYSICIAN: Dr. He.  CHIEF COMPLAINT:  No chief complaint on file.  Right-sided abdominal pain today. HISTORY OF PRESENT ILLNESS:  Julie Lambert  is a 52 y.o. female with a known history of bipolar disorder, depression, suicidal ideation, diabetes, uterine fibroids, diverticulitis and diverticulosis.  The patient complains of right side abdominal painful for 1 months worsening today, which is on the right upper quadrant, intermittent 6-8 out of 10 radiation to the right side of the back.  She has nausea and vomited once.  She denies any melena, bloody stool, hematuria or dysuria.  She was crying when I saw the patient.  She is admitted to psych unit due to depression.  PAST MEDICAL HISTORY:   Past Medical History:  Diagnosis Date  . Bipolar 2 disorder ([place])   . Diabetes mellitus without complication ([place])   . Diverticulitis   . Diverticulosis   . Uterine fibroid     PAST SURGICAL HISTORY:   Past Surgical History:  Procedure Laterality Date  . BACK SURGERY    . CHOLECYSTECTOMY    . LUMBAR SPINE SURGERY      SOCIAL HISTORY:   Social History   Tobacco Use  . Smoking status: Current Every Day Smoker    Packs/day: 0.50    Years: 35.00    Pack years: 17.50    Types: Cigarettes  . Smokeless tobacco: Never Used  Substance Use Topics  . Alcohol use: Yes    Comment: last use 12/03/2017    FAMILY HISTORY:   Family History  Problem Relation Age of Onset  . CAD Mother   . Diabetes Father     DRUG ALLERGIES:   Allergies  Allergen Reactions  . Iodinated Diagnostic Agents Itching    Patient stated she had itching on her back after IV injection    REVIEW OF SYSTEMS:   Review of Systems  Constitutional: Negative for chills,  fever and malaise/fatigue.  HENT: Negative for sore throat.   Eyes: Negative for blurred vision and double vision.  Respiratory: Negative for cough, hemoptysis, shortness of breath, wheezing and stridor.   Cardiovascular: Negative for chest pain, palpitations, orthopnea and leg swelling.  Gastrointestinal: Positive for abdominal pain, constipation, nausea and vomiting. Negative for blood in stool, diarrhea and melena.  Genitourinary: Negative for dysuria, flank pain and hematuria.  Musculoskeletal: Negative for back pain and joint pain.  Skin: Negative for rash.  Neurological: Negative for dizziness, sensory change, focal weakness, seizures, loss of consciousness, weakness and headaches.  Endo/Heme/Allergies: Negative for polydipsia.  Psychiatric/Behavioral: Positive for depression. The patient is nervous/anxious.     MEDICATIONS AT HOME:   Prior to Admission medications   Medication Sig Start Date End Date Taking? Authorizing Provider  albuterol (PROVENTIL HFA;VENTOLIN HFA) 108 (90 Base) MCG/ACT inhaler Inhale 2 puffs into the lungs every 6 (six) hours as needed for wheezing or shortness of breath. 05/28/17   Schuyler Amor, MD  blood glucose meter kit and supplies Dispense based on patient and insurance preference. Use up to four times daily as directed. (FOR ICD-10 E10.9, E11.9). 01/06/18   [name], Herma Ard B, MD  diclofenac (VOLTAREN) 50 MG EC tablet Take 1 tablet (50 mg total) by mouth 2 (two) times daily. 01/06/18   [name], Herma Ard  B, MD  glipiZIDE (GLUCOTROL) 5 MG tablet Take 0.5 tablets (2.5 mg total) by mouth 2 (two) times daily before a meal. 01/06/18   [name], [name], MD  hydrOXYzine (ATARAX/VISTARIL) 25 MG tablet Take 1 tablet (25 mg total) by mouth 3 (three) times daily as needed for anxiety. 01/06/18   [name], Herma Ard B, MD  insulin aspart protamine- aspart (NOVOLOG MIX 70/30) (70-30) 100 UNIT/ML injection Inject 0.1 mLs (10 Units total) into the skin 2 (two)  times daily with a meal. 01/06/18   [name], [name], MD  metFORMIN (GLUCOPHAGE) 1000 MG tablet Take 1 tablet (1,000 mg total) by mouth 2 (two) times daily with a meal. 01/06/18   [name], [name], MD  QUEtiapine (SEROQUEL) 100 MG tablet Take 1 tablet (100 mg total) by mouth at bedtime. 01/06/18   [name], Herma Ard B, MD  QUEtiapine (SEROQUEL) 50 MG tablet Take 1 tablet (50 mg total) by mouth at bedtime. 12/30/17   McNew, [name], MD  senna-docusate (SENOKOT-S) 8.6-50 MG tablet Take 1 tablet by mouth at bedtime. 01/06/18   [name], Herma Ard B, MD  traZODone (DESYREL) 100 MG tablet Take 1 tablet (100 mg total) by mouth at bedtime as needed for sleep. 01/06/18   [name], Herma Ard B, MD      VITAL SIGNS:  Blood pressure 108/73, pulse 95, temperature 98.7 F (37.1 C), temperature source Oral, resp. rate 17, height 5' 7.01" (1.702 m), weight 70.8 kg, SpO2 100 %.  PHYSICAL EXAMINATION:  Physical Exam  GENERAL:  52 y.o.-year-old patient lying in the bed with no acute distress.  EYES: Pupils equal, round, reactive to light and accommodation. No scleral icterus. Extraocular muscles intact.  HEENT: Head atraumatic, normocephalic. Oropharynx and nasopharynx clear.  NECK:  Supple, no jugular venous distention. No thyroid enlargement, no tenderness.  LUNGS: Normal breath sounds bilaterally, no wheezing, rales,rhonchi or crepitation. No use of accessory muscles of respiration.  CARDIOVASCULAR: S1, S2 normal. No murmurs, rubs, or gallops.  ABDOMEN: Soft, tenderness on right upper quadrant and the right side of the back on palpation, nondistended, no rigidity or rebound. Bowel sounds present. No organomegaly or mass.  EXTREMITIES: No pedal edema, cyanosis, or clubbing.  NEUROLOGIC: Cranial nerves II through XII are intact. Muscle strength 5/5 in all extremities. Sensation intact. Gait not checked.  PSYCHIATRIC: The patient is alert and oriented x 3.  SKIN: No obvious rash, lesion, or ulcer.    LABORATORY PANEL:   CBC Recent Labs  Lab 01/07/18 1703  WBC 8.8  HGB 12.7  HCT 38.4  PLT 245   ------------------------------------------------------------------------------------------------------------------  Chemistries  Recent Labs  Lab 01/07/18 1703  NA 139  K 4.4  CL 104  CO2 27  GLUCOSE 128*  BUN 15  CREATININE 0.58  CALCIUM 9.1  AST 15  ALT 15  ALKPHOS 71  BILITOT 0.4   ------------------------------------------------------------------------------------------------------------------  Cardiac Enzymes No results for input(s): TROPONINI in the last 168 hours. ------------------------------------------------------------------------------------------------------------------  RADIOLOGY:  No results found.    IMPRESSION AND PLAN:   Abdominal pain, possible muscleskeletal etiology. The patient had CAT scan of abdomen pelvis 5 days ago, which was unremarkable.  Lipase and liver function tests are normal.  CBC is unremarkable.  Follow-up ultrasound of the RUQ abdomen.  Pain control.  Diabetes.  Controlled, continue NovoLog 70/30, glipizide and metformin. Substance abuse.  Treatment per psychiatrist. Alcohol abuse.  No signs of withdrawal. Bipolar disorder and suicidal ideation (resolved).  Treatment per psychiatrist.  I discussed with Dr. He. All the records  are reviewed and case discussed with ED provider. Management plans discussed with the patient, family and they are in agreement.  CODE STATUS: Full code.  TOTAL TIME TAKING CARE OF THIS PATIENT: 42 minutes.    Demetrios Loll M.D on 01/07/2018 at 5:51 PM  Between 7am to 6pm - Pager - [phone]  After 6pm go to [email or web address] - Proofreader  [facility]  Office  [phone]  CC: Primary care physician; Patient, No Pcp Per   Note: This dictation was prepared with Dragon dictation along with smaller phrase technology. Any transcriptional errors that result from  this process are unin

## 2018-01-07 NOTE — Progress Notes (Signed)
[facility] MD Progress Note  01/07/2018 11:49 AM Julie Lambert  MRN:  [id number]  Subjective:   Julie Lambert has a history of cocaine and alcohol addiction and mood instability admitted for suicidal ideation in the context of continuous use and severe social stressors. She was recently hospitalized here and transferred to [facility] for rehab. But she did not like [facility], and left the next day.  She then relapsed on cocaine and returned to [facility].    Pt is known to Careers information officer from her last admission. She is calm and cooperative. She said that she is sad about not being with family on Thanksgiving Day, but also stated "I know this is the consequences for the bad things I did!"  She wants to take the responsibility and get herself better and hope to find a job in the near future as well.  She said that she plans to go to a half way house, [facility] on Monday.   She is also glad to know that her aunt has picked up her mom from the Nursing home to spend Thanksgiving with family.   She endorses sadness, poor energy and concentration, but no SI.  She said that she did well with Celexa in the past and would like to restart, which I agreed.   She also reports that Voltaren has been very helpful to her sciatica pain.   Principal Problem: Bipolar I disorder, most recent episode depressed, severe without psychotic features ([place]) Diagnosis: Principal Problem:   Bipolar I disorder, most recent episode depressed, severe without psychotic features ([place]) Active Problems:   Diabetes mellitus without complication ([place])   Cocaine use disorder, moderate, dependence ([place])   Tobacco use disorder   Suicidal ideation  Total Time spent with patient: 30 minutes  Past Psychiatric History: mood instability, substance abuse  Past Medical History:  Past Medical History:  Diagnosis Date  . Bipolar 2 disorder ([place])   . Diabetes mellitus without complication ([place])   . Diverticulitis   . Diverticulosis   . Uterine fibroid      Past Surgical History:  Procedure Laterality Date  . BACK SURGERY    . CHOLECYSTECTOMY    . LUMBAR SPINE SURGERY     Family History:  Family History  Problem Relation Age of Onset  . CAD Mother   . Diabetes Father    Family Psychiatric  History: none reported Social History:  Social History   Substance and Sexual Activity  Alcohol Use Yes   Comment: last use 12/03/2017     Social History   Substance and Sexual Activity  Drug Use Yes  . Types: Cocaine, Marijuana    Social History   Socioeconomic History  . Marital status: Divorced    Spouse name: Not on file  . Number of children: Not on file  . Years of education: Not on file  . Highest education level: Not on file  Occupational History  . Not on file  Social Needs  . Financial resource strain: Not on file  . Food insecurity:    Worry: Not on file    Inability: Not on file  . Transportation needs:    Medical: Not on file    Non-medical: Not on file  Tobacco Use  . Smoking status: Current Every Day Smoker    Packs/day: 0.50    Years: 35.00    Pack years: 17.50    Types: Cigarettes  . Smokeless tobacco: Never Used  Substance and Sexual Activity  . Alcohol  use: Yes    Comment: last use 12/03/2017  . Drug use: Yes    Types: Cocaine, Marijuana  . Sexual activity: Yes  Lifestyle  . Physical activity:    Days per week: Not on file    Minutes per session: Not on file  . Stress: Not on file  Relationships  . Social connections:    Talks on phone: Not on file    Gets together: Not on file    Attends religious service: Not on file    Active member of club or organization: Not on file    Attends meetings of clubs or organizations: Not on file    Relationship status: Not on file  Other Topics Concern  . Not on file  Social History Narrative  . Not on file   Additional Social History:   Sleep: Fair  Appetite:  Fair  Current Medications: Current Facility-Administered Medications  Medication  Dose Route Frequency Provider Last Rate Last Dose  . acetaminophen (TYLENOL) tablet 650 mg  650 mg Oral Q6H PRN Clapacs, [name], MD   650 mg at 01/07/18 1135  . alum & mag hydroxide-simeth (MAALOX/MYLANTA) 200-200-20 MG/5ML suspension 30 mL  30 mL Oral Q4H PRN Clapacs, [name], MD      . citalopram (CELEXA) tablet 20 mg  20 mg Oral Daily Corby Villasenor, MD      . diclofenac (VOLTAREN) EC tablet 50 mg  50 mg Oral BID [name], [name], MD   50 mg at 01/07/18 0808  . glipiZIDE (GLUCOTROL) tablet 2.5 mg  2.5 mg Oral BID AC Clapacs, [name], MD   2.5 mg at 01/07/18 0808  . hydrOXYzine (ATARAX/VISTARIL) tablet 25 mg  25 mg Oral TID PRN Clapacs, [name], MD   25 mg at 01/07/18 8185  . insulin aspart (novoLOG) injection 0-9 Units  0-9 Units Subcutaneous TID WC Clapacs, [name], MD   2 Units at 01/07/18 [phone]  . insulin aspart protamine- aspart (NOVOLOG MIX 70/30) injection 10 Units  10 Units Subcutaneous BID WC Clapacs, [name], MD   10 Units at 01/07/18 0813  . magnesium hydroxide (MILK OF MAGNESIA) suspension 30 mL  30 mL Oral Daily PRN Clapacs, [name], MD   30 mL at 01/04/18 1510  . metFORMIN (GLUCOPHAGE) tablet 1,000 mg  1,000 mg Oral BID WC Clapacs, [name], MD   1,000 mg at 01/07/18 9702  . nicotine polacrilex (NICORETTE) gum 2 mg  2 mg Oral PRN Clapacs, [name], MD   2 mg at 01/07/18 6378  . polyvinyl alcohol (LIQUIFILM TEARS) 1.4 % ophthalmic solution 1 drop  1 drop Both Eyes PRN [name], RPH   1 drop at 01/07/18 1135  . QUEtiapine (SEROQUEL) tablet 100 mg  100 mg Oral QHS Clapacs, [name], MD   100 mg at 01/06/18 2111  . senna-docusate (Senokot-S) tablet 1 tablet  1 tablet Oral QHS [name], [name], MD   1 tablet at 01/06/18 2111  . traZODone (DESYREL) tablet 100 mg  100 mg Oral QHS PRN Clapacs, [name], MD   100 mg at 01/06/18 2115  . tuberculin injection 5 Units  5 Units Intradermal Once [name], [name], MD   5 Units at 01/06/18 2111    Lab Results:  Results for orders placed or  performed during the hospital encounter of 01/02/18 (from the past 48 hour(s))  Glucose, capillary     Status: Abnormal   Collection Time: 01/05/18  4:17 PM  Result Value Ref [place]   Glucose-Capillary 124 (H) 70 - 99 mg/dL   Comment 1 Notify RN   Glucose, capillary     Status: Abnormal   Collection Time: 01/05/18  8:36 PM  Result Value Ref Range   Glucose-Capillary 130 (H) 70 - 99 mg/dL  Glucose, capillary     Status: Abnormal   Collection Time: 01/06/18  7:04 AM  Result Value Ref Range   Glucose-Capillary 171 (H) 70 - 99 mg/dL  Glucose, capillary     Status: Abnormal   Collection Time: 01/06/18 11:37 AM  Result Value Ref Range   Glucose-Capillary 139 (H) 70 - 99 mg/dL  Glucose, capillary     Status: None   Collection Time: 01/06/18  4:04 PM  Result Value Ref Range   Glucose-Capillary 82 70 - 99 mg/dL  Glucose, capillary     Status: None   Collection Time: 01/06/18  8:30 PM  Result Value Ref Range   Glucose-Capillary 97 70 - 99 mg/dL  Glucose, capillary     Status: Abnormal   Collection Time: 01/07/18  7:05 AM  Result Value Ref Range   Glucose-Capillary 191 (H) 70 - 99 mg/dL  Glucose, capillary     Status: Abnormal   Collection Time: 01/07/18 11:33 AM  Result Value Ref Range   Glucose-Capillary 110 (H) 70 - 99 mg/dL    Blood Alcohol level:  Lab Results  Component Value Date   ETH <10 01/02/2018   ETH <10 73/53/2992    Metabolic Disorder Labs: Lab Results  Component Value Date   HGBA1C 12.5 (H) 12/26/2017   MPG 312.05 12/26/2017   No results found for: PROLACTIN Lab Results  Component Value Date   CHOL 215 (H) 12/26/2017   TRIG 179 (H) 12/26/2017   HDL 65 12/26/2017   CHOLHDL 3.3 12/26/2017   VLDL 36 12/26/2017   LDLCALC 114 (H) 12/26/2017   LDLCALC 79 10/09/2011    Physical Findings: AIMS:  , ,  ,  ,    CIWA:    COWS:     Musculoskeletal: Strength & Muscle Tone: within normal limits Gait & Station: normal Patient leans: N/A  Psychiatric Specialty  Exam: Physical Exam  Nursing note and vitals reviewed. Psychiatric: She has a normal mood and affect. Her speech is normal and behavior is normal. Judgment and thought content normal. Cognition and memory are normal.    Review of Systems  Neurological: Negative.   Psychiatric/Behavioral: Positive for substance abuse.  All other systems reviewed and are negative.   Blood pressure 108/73, pulse 95, temperature 98.7 F (37.1 C), temperature source Oral, resp. rate 17, height 5' 7.01" (1.702 m), weight 70.8 kg, SpO2 100 %.Body mass index is 24.44 kg/m.  General Appearance: Casual and Well Groomed  Eye Contact:  Good  Speech:  Clear and Coherent and Normal Rate  Volume:  Normal  Mood:  Depressed  Affect:  Appropriate, Congruent and Depressed  Thought Process:  Goal Directed and Descriptions of Associations: Intact  Orientation:  Full (Time, Place, and Person)  Thought Content:  WDL  Suicidal Thoughts:  No  Homicidal Thoughts:  No  Memory:  Immediate;   Fair Recent;   Fair Remote;   Fair  Judgement:  Fair  Insight:  Fair and Lacking  Psychomotor Activity:  Normal  Concentration:  Concentration: Fair and Attention Span: Fair  Recall:  [employer] of Knowledge:  Fair  Language:  Fair  Akathisia:  No  Handed:  Right  AIMS (  if indicated):     Assets:  Communication Skills Desire for Improvement Physical Health Resilience  ADL's:  Intact  Cognition:  WNL  Sleep:  Number of Hours: 8     Treatment Plan Summary: Daily contact with patient to assess and evaluate symptoms and progress in treatment and Medication management   Julie Lambert is a 52 year old female with a history of cocaine addiction and bipolar disorder admitted for suicidal ideation in the context of relapse on cocaine.  Pt reports motivation to remain abstinent and is seeking inpatient treatment for substance abuse.    #Suicidal ideation, resolved -patient is able to contract for safety in the hospital  #Mood  (History of Bipolar disorder), improved -continue Seroquel 100 mg nightly - ADD celexa 20mg  daily, given positive response in the past, per pt's report.  -Trazodone 100 mg qhs prn sleep  #Cocaine addiction -desires long-term esidential substance abuse treatment  #DM, CBG is better controlled -continue Novolog 70/30 10 units BID with meals -Metformin 1000 mg BID with meals -Glipizide 2.5 mg BID with meals -sliding scale insulin, ADA diet  #Constipation -bowel regimen  #Sciatica -Voltaren 50 mg BID  #Smoking cessation -nicotine patch is available  #Disposition -discharge to [facility] program [facility] -follow up with the local provider -30 day supply of medication will be provided -PPD placed  Nuh Lipton, MD 01/07/2018, 11:49 AM

## 2018-01-07 NOTE — Progress Notes (Signed)
Received Julie Lambert this AM after breakfast, she was compliant with her medications and insulin. She has been sleeping most of the day except for her meals and briefs talking on the phone. Later, she arrived to the nurses stations crying and C/O acute right lower abdomen pain. The doctor was notified - she called the hospitalist - and tests and blood was ordered.

## 2018-01-08 ENCOUNTER — Inpatient Hospital Stay: Payer: No Typology Code available for payment source

## 2018-01-08 LAB — GLUCOSE, CAPILLARY
Glucose-Capillary: 165 mg/dL — ABNORMAL HIGH (ref 70–99)
Glucose-Capillary: 140 mg/dL — ABNORMAL HIGH (ref 70–99)
Glucose-Capillary: 121 mg/dL — ABNORMAL HIGH (ref 70–99)
Glucose-Capillary: 127 mg/dL — ABNORMAL HIGH (ref 70–99)

## 2018-01-08 MED ORDER — KETOROLAC TROMETHAMINE 60 MG/2ML IM SOLN
60.0000 mg | Freq: Once | INTRAMUSCULAR | Status: AC
  Administered 2018-01-08: 60 mg via INTRAMUSCULAR
  Filled 2018-01-08 (×2): qty 2

## 2018-01-08 MED ORDER — CIPROFLOXACIN HCL 500 MG PO TABS
500.0000 mg | ORAL_TABLET | Freq: Two times a day (BID) | ORAL | Status: AC
  Administered 2018-01-08 – 2018-01-11 (×6): 500 mg via ORAL
  Filled 2018-01-08 (×6): qty 1

## 2018-01-08 NOTE — Plan of Care (Signed)
D: Pt denies SI/HI/AV hallucinations. Pt is pleasant and cooperative. Pt has been out of her room for meals. Patient has been in bed most of day due to back pain. Patient was seen by general medicine doctor and having renal ultra sound done.  A: Pt was offered support and encouragement. Pt was given scheduled medications. Pt was encourage to attend groups. Q 15 minute checks were done for safety.  R: Pt is taking medication. Pt has no complaints.Pt receptive to treatment and safety maintained on unit.    Problem: Education: Goal: Knowledge of [place] [facility] Education information/materials will improve Outcome: Progressing   Problem: Safety: Goal: Periods of time without injury will increase Outcome: Progressing   Problem: Education: Goal: Utilization of techniques to improve thought processes will improve Outcome: Progressing Goal: Knowledge of the prescribed therapeutic regimen will improve Outcome: Progressing   Problem: Safety: Goal: Ability to disclose and discuss suicidal ideas will improve Outcome: Progressing Goal: Ability to identify and utilize support systems that promote safety will improve Outcome: Progressing

## 2018-01-08 NOTE — Tx Team (Signed)
Interdisciplinary Treatment and Diagnostic Plan Update  01/08/2018 Time of Session: 830am Julie Lambert MRN: [id number]  Principal Diagnosis: Bipolar I disorder, most recent episode depressed, severe without psychotic features ([place])  Secondary Diagnoses: Principal Problem:   Bipolar I disorder, most recent episode depressed, severe without psychotic features ([place]) Active Problems:   Diabetes mellitus without complication ([place])   Cocaine use disorder, moderate, dependence ([place])   Tobacco use disorder   Suicidal ideation   Current Medications:  Current Facility-Administered Medications  Medication Dose Route Frequency Provider Last Rate Last Dose  . acetaminophen (TYLENOL) tablet 650 mg  650 mg Oral Q6H PRN Clapacs, [name], MD   650 mg at 01/07/18 2114  . alum & mag hydroxide-simeth (MAALOX/MYLANTA) 200-200-20 MG/5ML suspension 30 mL  30 mL Oral Q4H PRN Clapacs, [name], MD      . citalopram (CELEXA) tablet 20 mg  20 mg Oral Daily He, [name], MD   20 mg at 01/08/18 0848  . diclofenac (VOLTAREN) EC tablet 50 mg  50 mg Oral BID [name], [name], MD   50 mg at 01/08/18 0849  . glipiZIDE (GLUCOTROL) tablet 2.5 mg  2.5 mg Oral BID AC Clapacs, [name], MD   2.5 mg at 01/08/18 0849  . hydrOXYzine (ATARAX/VISTARIL) tablet 25 mg  25 mg Oral TID PRN Clapacs, [name], MD   25 mg at 01/07/18 2114  . insulin aspart (novoLOG) injection 0-9 Units  0-9 Units Subcutaneous TID WC Clapacs, [name], MD   2 Units at 01/08/18 0848  . insulin aspart protamine- aspart (NOVOLOG MIX 70/30) injection 10 Units  10 Units Subcutaneous BID WC Clapacs, [name], MD   10 Units at 01/08/18 0848  . magnesium hydroxide (MILK OF MAGNESIA) suspension 30 mL  30 mL Oral Daily PRN Clapacs, [name], MD   30 mL at 01/04/18 1510  . metFORMIN (GLUCOPHAGE) tablet 1,000 mg  1,000 mg Oral BID WC Clapacs, [name], MD   1,000 mg at 01/08/18 0848  . nicotine polacrilex (NICORETTE) gum 2 mg  2 mg Oral PRN Clapacs, [name], MD   2 mg  at 01/07/18 1923  . polyvinyl alcohol (LIQUIFILM TEARS) 1.4 % ophthalmic solution 1 drop  1 drop Both Eyes PRN [name], RPH   1 drop at 01/07/18 1135  . QUEtiapine (SEROQUEL) tablet 100 mg  100 mg Oral QHS Clapacs, [name], MD   100 mg at 01/07/18 2114  . senna-docusate (Senokot-S) tablet 1 tablet  1 tablet Oral QHS [name], [name], MD   1 tablet at 01/07/18 2114  . traZODone (DESYREL) tablet 100 mg  100 mg Oral QHS PRN Clapacs, [name], MD   100 mg at 01/07/18 2114  . tuberculin injection 5 Units  5 Units Intradermal Once [name], [name], MD   5 Units at 01/06/18 2111   PTA Medications: Medications Prior to Admission  Medication Sig Dispense Refill Last Dose  . albuterol (PROVENTIL HFA;VENTOLIN HFA) 108 (90 Base) MCG/ACT inhaler Inhale 2 puffs into the lungs every 6 (six) hours as needed for wheezing or shortness of breath. 1 Inhaler 2 PRN at PRN  . QUEtiapine (SEROQUEL) 50 MG tablet Take 1 tablet (50 mg total) by mouth at bedtime.     . [DISCONTINUED] glipiZIDE (GLUCOTROL) 5 MG tablet Take 0.5 tablets (2.5 mg total) by mouth 2 (two) times daily before a meal. 60 tablet 0   . [DISCONTINUED] insulin aspart protamine- aspart (NOVOLOG MIX 70/30) (70-30) 100  UNIT/ML injection Inject 0.1 mLs (10 Units total) into the skin 2 (two) times daily with a meal. 10 mL 0   . [DISCONTINUED] metFORMIN (GLUCOPHAGE) 1000 MG tablet Take 1 tablet (1,000 mg total) by mouth 2 (two) times daily with a meal. 60 tablet 0     Patient Stressors: Financial difficulties Medication change or noncompliance Substance abuse  Patient Strengths: Agricultural engineer for treatment/growth Supportive family/friends  Treatment Modalities: Medication Management, Group therapy, Case management,  1 to 1 session with clinician, Psychoeducation, Recreational therapy.   Physician Treatment Plan for Primary Diagnosis: Bipolar I disorder, most recent episode depressed, severe without psychotic features  ([place]) Long Term Goal(s): Improvement in symptoms so as ready for discharge Improvement in symptoms so as ready for discharge   Short Term Goals: Ability to verbalize feelings will improve Ability to disclose and discuss suicidal ideas Ability to identify changes in lifestyle to reduce recurrence of condition will improve Ability to identify triggers associated with substance abuse/mental health issues will improve  Medication Management: Evaluate patient's response, side effects, and tolerance of medication regimen.  Therapeutic Interventions: 1 to 1 sessions, Unit Group sessions and Medication administration.  Evaluation of Outcomes: Progressing  Physician Treatment Plan for Secondary Diagnosis: Principal Problem:   Bipolar I disorder, most recent episode depressed, severe without psychotic features ([place]) Active Problems:   Diabetes mellitus without complication ([place])   Cocaine use disorder, moderate, dependence ([place])   Tobacco use disorder   Suicidal ideation  Long Term Goal(s): Improvement in symptoms so as ready for discharge Improvement in symptoms so as ready for discharge   Short Term Goals: Ability to verbalize feelings will improve Ability to disclose and discuss suicidal ideas Ability to identify changes in lifestyle to reduce recurrence of condition will improve Ability to identify triggers associated with substance abuse/mental health issues will improve     Medication Management: Evaluate patient's response, side effects, and tolerance of medication regimen.  Therapeutic Interventions: 1 to 1 sessions, Unit Group sessions and Medication administration.  Evaluation of Outcomes: Progressing   RN Treatment Plan for Primary Diagnosis: Bipolar I disorder, most recent episode depressed, severe without psychotic features ([place]) Long Term Goal(s): Knowledge of disease and therapeutic regimen to maintain health will improve  Short Term Goals: Ability to verbalize feelings  will improve, Ability to identify and develop effective coping behaviors will improve and Compliance with prescribed medications will improve  Medication Management: RN will administer medications as ordered by provider, will assess and evaluate patient's response and provide education to patient for prescribed medication. RN will report any adverse and/or side effects to prescribing provider.  Therapeutic Interventions: 1 on 1 counseling sessions, Psychoeducation, Medication administration, Evaluate responses to treatment, Monitor vital signs and CBGs as ordered, Perform/monitor CIWA, COWS, AIMS and Fall Risk screenings as ordered, Perform wound care treatments as ordered.  Evaluation of Outcomes: Progressing   LCSW Treatment Plan for Primary Diagnosis: Bipolar I disorder, most recent episode depressed, severe without psychotic features ([place]) Long Term Goal(s): Safe transition to appropriate next level of care at discharge, Engage patient in therapeutic group addressing interpersonal concerns.  Short Term Goals: Engage patient in aftercare planning with referrals and resources, Increase social support, Increase ability to appropriately verbalize feelings, Increase emotional regulation, Facilitate acceptance of mental health diagnosis and concerns, Identify triggers associated with mental health/substance abuse issues and Increase skills for wellness and recovery  Therapeutic Interventions: Assess for all discharge needs, 1 to 1 time with Social worker, Explore available resources and  support systems, Assess for adequacy in community support network, Educate family and significant other(s) on suicide prevention, Complete Psychosocial Assessment, Interpersonal group therapy.  Evaluation of Outcomes: Progressing   Progress in Treatment: Attending groups: Yes. Participating in groups: Yes. Taking medication as prescribed: Yes. Toleration medication: Yes. Family/Significant other contact made:  Yes, individual(s) contacted:  Gillermo Murdoch, son Patient understands diagnosis: Yes. Discussing patient identified problems/goals with staff: Yes. Medical problems stabilized or resolved: Yes. Denies suicidal/homicidal ideation: Yes. Issues/concerns per patient self-inventory: No. Other: NA  New problem(s) identified: No, Describe:  None reported  New Short Term/Long Term Goal(s):"Attend more groups, not isolate myself so much"  Patient Goals:  "Attend more groups, not isolate myself so much"  Discharge Plan or Barriers: Pt will return home and follow up with outpatient treatment  Reason for Continuation of Hospitalization: Medication stabilization  Estimated Length of Stay: Discharge plan for 12/2   Recreational Therapy: Patient Stressors: N/A Patient Goal: Patient will successfully identify 2 ways of making healthy decisions post d/c within 5 recreation therapy group sessions  Attendees: Patient: 01/08/2018 10:02 AM  Physician: Donato Heinz, MD 01/08/2018 10:02 AM  Nursing: West Pugh RN 01/08/2018 10:02 AM  RN Care Manager: 01/08/2018 10:02 AM  Social Worker: Sanjuana Kava, LCSW 01/08/2018 10:02 AM  Recreational Therapist:  01/08/2018 10:02 AM  Other:  01/08/2018 10:02 AM  Other:  01/08/2018 10:02 AM  Other: 01/08/2018 10:02 AM    Scribe for Treatment Team: Yvette Rack, LCSW 01/08/2018 10:02 AM

## 2018-01-08 NOTE — Progress Notes (Addendum)
[facility] [place] NAME: Julie Lambert    MR#:  [id number]  DATE OF BIRTH:  1965/09/02  SUBJECTIVE:  Patient seen and evaluated today Has pain in the lower back and right flank area Complains of pain in the bilateral flank areas Patient says pain increases whenever she eats No complaints of abdominal pain today  REVIEW OF SYSTEMS:    ROS  CONSTITUTIONAL: No documented fever. No fatigue, weakness. No weight gain, no weight loss.  EYES: No blurry or double vision.  ENT: No tinnitus. No postnasal drip. No redness of the oropharynx.  RESPIRATORY: No cough, no wheeze, no hemoptysis. No dyspnea.  CARDIOVASCULAR: No chest pain. No orthopnea. No palpitations. No syncope.  GASTROINTESTINAL: No nausea, no vomiting or diarrhea. No abdominal pain. No melena or hematochezia.  GENITOURINARY: No dysuria or hematuria.  ENDOCRINE: No polyuria or nocturia. No heat or cold intolerance.  HEMATOLOGY: No anemia. No bruising. No bleeding.  INTEGUMENTARY: No rashes. No lesions.  MUSCULOSKELETAL: No arthritis. No swelling. No gout.  Low back pain NEUROLOGIC: No numbness, tingling, or ataxia. No seizure-type activity.  PSYCHIATRIC: No anxiety. No insomnia. No ADD.   DRUG ALLERGIES:   Allergies  Allergen Reactions  . Iodinated Diagnostic Agents Itching    Patient stated she had itching on her back after IV injection    VITALS:  Blood pressure (!) 143/96, pulse 100, temperature 98.4 F (36.9 C), temperature source Oral, resp. rate 15, height 5' 7.01" (1.702 m), weight 70.8 kg, SpO2 100 %.  PHYSICAL EXAMINATION:   Physical Exam  GENERAL:  52 y.o.-year-old patient lying in the bed with no acute distress.  EYES: Pupils equal, round, reactive to light and accommodation. No scleral icterus. Extraocular muscles intact.  HEENT: Head atraumatic, normocephalic. Oropharynx and nasopharynx clear.  NECK:  Supple, no jugular venous distention. No thyroid  enlargement, no tenderness.  LUNGS: Normal breath sounds bilaterally, no wheezing, rales, rhonchi. No use of accessory muscles of respiration.  CARDIOVASCULAR: S1, S2 normal. No murmurs, rubs, or gallops.  ABDOMEN: Soft,  nondistended. Bowel sounds present. No organomegaly or mass.  CV angle tenderness noted bilaterally EXTREMITIES: No cyanosis, clubbing or edema b/l.    NEUROLOGIC: Cranial nerves II through XII are intact. No focal Motor or sensory deficits b/l.   PSYCHIATRIC: The patient is alert and oriented x 3.  SKIN: No obvious rash, lesion, or ulcer.   LABORATORY PANEL:   CBC Recent Labs  Lab 01/07/18 1703  WBC 8.8  HGB 12.7  HCT 38.4  PLT 245   ------------------------------------------------------------------------------------------------------------------ Chemistries  Recent Labs  Lab 01/07/18 1703  NA 139  K 4.4  CL 104  CO2 27  GLUCOSE 128*  BUN 15  CREATININE 0.58  CALCIUM 9.1  AST 15  ALT 15  ALKPHOS 71  BILITOT 0.4   ------------------------------------------------------------------------------------------------------------------  Cardiac Enzymes No results for input(s): TROPONINI in the last 168 hours. ------------------------------------------------------------------------------------------------------------------  RADIOLOGY:  US Abdomen Limited Ruq  Result Date: 01/08/2018 CLINICAL DATA:  Abdominal pain.  Cholecystectomy. EXAM: ULTRASOUND ABDOMEN LIMITED RIGHT UPPER QUADRANT COMPARISON:  CT 01/02/2018 FINDINGS: Gallbladder: Absent. Common bile duct: Diameter: 3 mm. Liver: Normal cortical echogenicity. 7.5 x 6.1 x 4.4 cm left lobe hemangioma is stable. Portal vein is patent on color Doppler imaging with normal direction of blood flow towards the liver. IMPRESSION: No acute intra-abdominal pathology. Electronically Signed   By: Marybelle Killings M.D.   On: 01/08/2018 08:46     ASSESSMENT AND PLAN:  52 year old female patient with history of bipolar  disorder, suicide ideation, depression, diabetes mellitus, uterine fibroids, diverticulosis currently under inpatient psychiatry unit  -Abdominal pain improved CT abdomen recent no acute pathology Abdominal ultrasound no pathology Lipase normal  -Bilateral flank pain versus low back pain musculoskeletal etiology Renal ultrasound showed no stone, hydronephrosis Has mild cystitis Ciprofloxacin abx orally for 3 days. Consider anti-inflammatory medications for control of pain Follow-up as needed  -Bipolar disorder Inpatient psychiatry management  -Type 2 diabetes mellitus Diabetic diet along with NovoLog insulin 70/30 Clement glipizide and metformin  -Alcohol abuse No evidence of any withdrawal   All the records are reviewed and case discussed with Care Management/Social Worker. Management plans discussed with the patient, family and they are in agreement.  CODE STATUS: Full code  DVT Prophylaxis: SCDs  TOTAL TIME TAKING CARE OF THIS PATIENT:35 minutes.   POSSIBLE D/C IN 1 to 2 DAYS, DEPENDING ON CLINICAL CONDITION.  Saundra Shelling M.D on 01/08/2018 at 12:27 PM  Between 7am to 6pm - Pager - [phone]  After 6pm go to [email or web address] - password [email or web address]  Office  [phone]  CC: Primary care physician; Patient, No Pcp Per  Note: This dictation was prepared with Dragon dictation along with smaller phrase technology. Any transcriptional errors that result from this process are unintentional.

## 2018-01-08 NOTE — B[facility] Group Notes (Signed)
[facility] LCSW Group Therapy Note  Date/Time: 01/08/18, 1215  Type of Therapy/Topic:  Group Therapy:  Balance in Life  Participation Level:  Did not attend  Description of Group:    This group will address the concept of balance and how it feels and looks when one is unbalanced. Patients will be encouraged to process areas in their lives that are out of balance, and identify reasons for remaining unbalanced. Facilitators will guide patients utilizing problem- solving interventions to address and correct the stressor making their life unbalanced. Understanding and applying boundaries will be explored and addressed for obtaining  and maintaining a balanced life. Patients will be encouraged to explore ways to assertively make their unbalanced needs known to significant others in their lives, using other group members and facilitator for support and feedback.  Therapeutic Goals: 1. Patient will identify two or more emotions or situations they have that consume much of in their lives. 2. Patient will identify signs/triggers that life has become out of balance:  3. Patient will identify two ways to set boundaries in order to achieve balance in their lives:  4. Patient will demonstrate ability to communicate their needs through discussion and/or role plays  Summary of Patient Progress:          Therapeutic Modalities:   Cognitive Behavioral Therapy Solution-Focused Therapy Assertiveness Training  Lurline Idol, LCSW

## 2018-01-08 NOTE — Progress Notes (Signed)
Recreation Therapy Notes   Date: 01/08/2018  Time: 9:30 am  Location: Craft Room  Behavioral response: Appropriate   Intervention Topic: Time Management  Discussion/Intervention:  Group content today was focused on time management. The group defined time management and identified healthy ways to manage time. Individuals expressed how much of the 24 hours they use in a day. Patients expressed how much time they use just for themselves personally. The group expressed how they have managed their time in the past. Individuals participated in the intervention "Managing Life" where they had a chance to see how much of the 24 hours they use and where it goes. Clinical Observations/Feedback:  Patient came to group and defined time management as scheduling. She stated time manangement is important because she is less stress when she manages her time. Participant expressed that time is very valuable to her. Individual was social with peers and staff while participating in the intervention. She left group early because she was in pain and never returned. Sarrinah Gardin LRT/CTRS         Ozzie Remmers 01/08/2018 11:18 AM

## 2018-01-08 NOTE — Progress Notes (Signed)
[facility] MD Progress Note  01/08/2018 4:09 AM Julie Lambert  MRN:  [id number]  Subjective:    Ms. Julie Lambert developed abdominal pain yesterday and was consulted by medical service. Abdominal and renal ultrasounds are negative. NSAI treatment was suggested. The patient is on Mobic already. She was anxiousely awaiting transfer to [facility] half-way house on Monday. She hopes to feel physically better by then. She is in visible discomfort. We will offer Tardol IM for severe pain.   She was restarted on Celexa on Thursday. It was helpful in the past.  Principal Problem: Bipolar I disorder, most recent episode depressed, severe without psychotic features ([place]) Diagnosis: Principal Problem:   Bipolar I disorder, most recent episode depressed, severe without psychotic features ([place]) Active Problems:   Diabetes mellitus without complication ([place])   Cocaine use disorder, moderate, dependence ([place])   Tobacco use disorder   Suicidal ideation  Total Time spent with patient: 20 minutes  Past Psychiatric History: depression, substance abuse  Past Medical History:  Past Medical History:  Diagnosis Date  . Bipolar 2 disorder ([place])   . Diabetes mellitus without complication ([place])   . Diverticulitis   . Diverticulosis   . Uterine fibroid     Past Surgical History:  Procedure Laterality Date  . BACK SURGERY    . CHOLECYSTECTOMY    . LUMBAR SPINE SURGERY     Family History:  Family History  Problem Relation Age of Onset  . CAD Mother   . Diabetes Father    Family Psychiatric  History: see H&P Social History:  Social History   Substance and Sexual Activity  Alcohol Use Yes   Comment: last use 12/03/2017     Social History   Substance and Sexual Activity  Drug Use Yes  . Types: Cocaine, Marijuana    Social History   Socioeconomic History  . Marital status: Divorced    Spouse name: Not on file  . Number of children: Not on file  . Years of education: Not on file  .  Highest education level: Not on file  Occupational History  . Not on file  Social Needs  . Financial resource strain: Not on file  . Food insecurity:    Worry: Not on file    Inability: Not on file  . Transportation needs:    Medical: Not on file    Non-medical: Not on file  Tobacco Use  . Smoking status: Current Every Day Smoker    Packs/day: 0.50    Years: 35.00    Pack years: 17.50    Types: Cigarettes  . Smokeless tobacco: Never Used  Substance and Sexual Activity  . Alcohol use: Yes    Comment: last use 12/03/2017  . Drug use: Yes    Types: Cocaine, Marijuana  . Sexual activity: Yes  Lifestyle  . Physical activity:    Days per week: Not on file    Minutes per session: Not on file  . Stress: Not on file  Relationships  . Social connections:    Talks on phone: Not on file    Gets together: Not on file    Attends religious service: Not on file    Active member of club or organization: Not on file    Attends meetings of clubs or organizations: Not on file    Relationship status: Not on file  Other Topics Concern  . Not on file  Social History Narrative  . Not on file   Additional Social History:  Sleep: Fair  Appetite:  Fair  Current Medications: Current Facility-Administered Medications  Medication Dose Route Frequency Provider Last Rate Last Dose  . acetaminophen (TYLENOL) tablet 650 mg  650 mg Oral Q6H PRN Clapacs, [name], MD   650 mg at 01/07/18 2114  . alum & mag hydroxide-simeth (MAALOX/MYLANTA) 200-200-20 MG/5ML suspension 30 mL  30 mL Oral Q4H PRN Clapacs, [name], MD      . citalopram (CELEXA) tablet 20 mg  20 mg Oral Daily He, [name], MD   20 mg at 01/07/18 1636  . diclofenac (VOLTAREN) EC tablet 50 mg  50 mg Oral BID Imagine Nest B, MD   50 mg at 01/07/18 1637  . glipiZIDE (GLUCOTROL) tablet 2.5 mg  2.5 mg Oral BID AC Clapacs, [name], MD   2.5 mg at 01/07/18 1637  . hydrOXYzine (ATARAX/VISTARIL) tablet 25 mg  25  mg Oral TID PRN Clapacs, [name], MD   25 mg at 01/07/18 2114  . insulin aspart (novoLOG) injection 0-9 Units  0-9 Units Subcutaneous TID WC Clapacs, [name], MD   2 Units at 01/07/18 [phone]  . insulin aspart protamine- aspart (NOVOLOG MIX 70/30) injection 10 Units  10 Units Subcutaneous BID WC Clapacs, [name], MD   10 Units at 01/07/18 1637  . magnesium hydroxide (MILK OF MAGNESIA) suspension 30 mL  30 mL Oral Daily PRN Clapacs, [name], MD   30 mL at 01/04/18 1510  . metFORMIN (GLUCOPHAGE) tablet 1,000 mg  1,000 mg Oral BID WC Clapacs, [name], MD   1,000 mg at 01/07/18 1637  . nicotine polacrilex (NICORETTE) gum 2 mg  2 mg Oral PRN Clapacs, [name], MD   2 mg at 01/07/18 1923  . polyvinyl alcohol (LIQUIFILM TEARS) 1.4 % ophthalmic solution 1 drop  1 drop Both Eyes PRN [name], RPH   1 drop at 01/07/18 1135  . QUEtiapine (SEROQUEL) tablet 100 mg  100 mg Oral QHS Clapacs, [name], MD   100 mg at 01/07/18 2114  . senna-docusate (Senokot-S) tablet 1 tablet  1 tablet Oral QHS Remedios Mckone B, MD   1 tablet at 01/07/18 2114  . traZODone (DESYREL) tablet 100 mg  100 mg Oral QHS PRN Clapacs, [name], MD   100 mg at 01/07/18 2114  . tuberculin injection 5 Units  5 Units Intradermal Once Byanka Landrus B, MD   5 Units at 01/06/18 2111    Lab Results:  Results for orders placed or performed during the hospital encounter of 01/02/18 (from the past 48 hour(s))  Glucose, capillary     Status: Abnormal   Collection Time: 01/06/18  7:04 AM  Result Value Ref Range   Glucose-Capillary 171 (H) 70 - 99 mg/dL  Glucose, capillary     Status: Abnormal   Collection Time: 01/06/18 11:37 AM  Result Value Ref Range   Glucose-Capillary 139 (H) 70 - 99 mg/dL  Glucose, capillary     Status: None   Collection Time: 01/06/18  4:04 PM  Result Value Ref Range   Glucose-Capillary 82 70 - 99 mg/dL  Glucose, capillary     Status: None   Collection Time: 01/06/18  8:30 PM  Result Value Ref Range    Glucose-Capillary 97 70 - 99 mg/dL  Glucose, capillary     Status: Abnormal   Collection Time: 01/07/18  7:05 AM  Result Value Ref Range   Glucose-Capillary 191 (H) 70 - 99 mg/dL  Glucose, capillary  Status: Abnormal   Collection Time: 01/07/18 11:33 AM  Result Value Ref Range   Glucose-Capillary 110 (H) 70 - 99 mg/dL  Glucose, capillary     Status: None   Collection Time: 01/07/18  4:12 PM  Result Value Ref Range   Glucose-Capillary 88 70 - 99 mg/dL  Lipase, blood     Status: None   Collection Time: 01/07/18  5:03 PM  Result Value Ref Range   Lipase 32 11 - 51 U/L    Comment: Performed at [facility], [address] [place], [place] [address]  Comprehensive metabolic panel     Status: Abnormal   Collection Time: 01/07/18  5:03 PM  Result Value Ref Range   Sodium 139 135 - 145 mmol/L   Potassium 4.4 3.5 - 5.1 mmol/L   Chloride 104 98 - 111 mmol/L   CO2 27 22 - 32 mmol/L   Glucose, Bld 128 (H) 70 - 99 mg/dL   BUN 15 6 - 20 mg/dL   Creatinine, Ser 0.58 0.44 - 1.00 mg/dL   Calcium 9.1 8.9 - 10.3 mg/dL   Total Protein 6.3 (L) 6.5 - 8.1 g/dL   Albumin 3.3 (L) 3.5 - 5.0 g/dL   AST 15 15 - 41 U/L   ALT 15 0 - 44 U/L   Alkaline Phosphatase 71 38 - 126 U/L   Total Bilirubin 0.4 0.3 - 1.2 mg/dL   GFR calc non Af Amer >60 >60 mL/min   GFR calc Af Amer >60 >60 mL/min   Anion gap 8 5 - 15    Comment: Performed at [facility], [address] [place], [place] [address]  CBC     Status: None   Collection Time: 01/07/18  5:03 PM  Result Value Ref Range   WBC 8.8 4.0 - 10.5 K/uL   RBC 4.16 3.87 - 5.11 MIL/uL   Hemoglobin 12.7 12.0 - 15.0 g/dL   HCT 38.4 36.0 - 46.0 %   MCV 92.3 80.0 - 100.0 fL   MCH 30.5 26.0 - 34.0 pg   MCHC 33.1 30.0 - 36.0 g/dL   RDW 12.0 11.5 - 15.5 %   Platelets 245 150 - 400 K/uL   nRBC 0.0 0.0 - 0.2 %    Comment: Performed at [facility], [address] [place], [place] [address]  Glucose, capillary     Status: None    Collection Time: 01/07/18  8:35 PM  Result Value Ref Range   Glucose-Capillary 97 70 - 99 mg/dL    Blood Alcohol level:  Lab Results  Component Value Date   ETH <10 01/02/2018   ETH <10 40/34/7425    Metabolic Disorder Labs: Lab Results  Component Value Date   HGBA1C 12.5 (H) 12/26/2017   MPG 312.05 12/26/2017   No results found for: PROLACTIN Lab Results  Component Value Date   CHOL 215 (H) 12/26/2017   TRIG 179 (H) 12/26/2017   HDL 65 12/26/2017   CHOLHDL 3.3 12/26/2017   VLDL 36 12/26/2017   LDLCALC 114 (H) 12/26/2017   LDLCALC 79 10/09/2011    Physical Findings: AIMS:  , ,  ,  ,    CIWA:    COWS:     Musculoskeletal: Strength & Muscle Tone: within normal limits Gait & Station: normal Patient leans: N/A  Psychiatric Specialty Exam: Physical Exam  Nursing note and vitals reviewed. Psychiatric: Her speech is normal and behavior is normal. Thought content normal. Her mood appears anxious. Cognition and  memory are normal. She expresses impulsivity.    Review of Systems  Neurological: Negative.   Psychiatric/Behavioral: Positive for substance abuse.  All other systems reviewed and are negative.   Blood pressure 108/73, pulse 95, temperature 98.7 F (37.1 C), temperature source Oral, resp. rate 17, height 5' 7.01" (1.702 m), weight 70.8 kg, SpO2 100 %.Body mass index is 24.44 kg/m.  General Appearance: Casual  Eye Contact:  Good  Speech:  Clear and Coherent  Volume:  Normal  Mood:  Anxious  Affect:  Appropriate  Thought Process:  Goal Directed and Descriptions of Associations: Intact  Orientation:  Full (Time, Place, and Person)  Thought Content:  WDL  Suicidal Thoughts:  No  Homicidal Thoughts:  No  Memory:  Immediate;   Fair Recent;   Fair Remote;   Fair  Judgement:  Poor  Insight:  Lacking  Psychomotor Activity:  Normal  Concentration:  Concentration: Fair and Attention Span: Fair  Recall:  [employer] of Knowledge:  Fair  Language:  Fair   Akathisia:  No  Handed:  Right  AIMS (if indicated):     Assets:  Communication Skills Desire for Improvement Physical Health Resilience Social Support Transportation  ADL's:  Intact  Cognition:  WNL  Sleep:  Number of Hours: 8     Treatment Plan Summary: Daily contact with patient to assess and evaluate symptoms and progress in treatment and Medication management   Ms. Julie Lambert is a 51 year old female with a history of cocaine addiction and bipolardisorderadmitted for suicidal ideation in the context of relapse on cocaine.Pt reports motivation to remain abstinent and is seeking inpatient treatment for substance abuse.  #Suicidal ideation,resolved -patient is able to contract for safety in the hospital  #Mood(History of Bipolar disorder), improved -continue Seroquel 100 mg nightly -continue Celexa 20 mg daily  -Trazodone 100 mgqhs prn sleep  #Cocaine addiction -desireslong-term esidential substance abuse treatment  #Abdominal pain -medicine input is greatly appreciated -[place] studies negative -Toradol 60 mg IM once  #DM, CBG is better controlled -continue Novolog 70/30 10 units BIDwith meals -Metformin 1000 mg BIDwith meals -Glipizide 2.5 mg BIDwith meals -sliding scale insulin, ADA diet  #Constipation -bowel regimen  #Sciatica -Voltaren 50 mg BID  #Smoking cessation -nicotine patch is available  #Disposition -discharge to [facility] program next week -follow up with the local provider -30 day supply of medication will be provided -PPD placed  Orson Slick, MD 01/08/2018, 4:09 AM

## 2018-01-08 NOTE — Consult Note (Signed)
Pharmacy Antibiotic Note  Julie Lambert is a 52 y.o. female admitted on 01/02/2018 with UTI.  Pharmacy has been consulted for Cipro dosing.  Plan: Ciprofloxacin 500 mg PO BID for 3 days per Dr Estanislado Pandy  Height: 5' 7.01" (170.2 cm) Weight: 156 lb 1.4 oz (70.8 kg) IBW/kg (Calculated) : 61.62  Temp (24hrs), Avg:98.4 F (36.9 C), Min:98.4 F (36.9 C), Max:98.4 F (36.9 C)  Recent Labs  Lab 01/02/18 0317 01/07/18 1703  WBC 13.7* 8.8  CREATININE 0.46 0.58    Estimated Creatinine Clearance: 79.1 mL/min (by C-G formula based on SCr of 0.58 mg/dL).    Allergies  Allergen Reactions  . Iodinated Diagnostic Agents Itching    Patient stated she had itching on her back after IV injection    Antimicrobials this admission: ciprofloxacin 11/29 >>   Microbiology results: 11/18 UCx: recollect   Thank you for allowing pharmacy to be a part of this patient's care.  [name], PharmD 01/08/2018 4:34 PM

## 2018-01-09 LAB — GLUCOSE, CAPILLARY
GLUCOSE-CAPILLARY: 74 mg/dL (ref 70–99)
Glucose-Capillary: 148 mg/dL — ABNORMAL HIGH (ref 70–99)
Glucose-Capillary: 106 mg/dL — ABNORMAL HIGH (ref 70–99)
Glucose-Capillary: 147 mg/dL — ABNORMAL HIGH (ref 70–99)

## 2018-01-09 MED ORDER — TRAZODONE HCL 50 MG PO TABS
150.0000 mg | ORAL_TABLET | Freq: Every evening | ORAL | Status: DC | PRN
  Administered 2018-01-09 – 2018-01-10 (×2): 150 mg via ORAL
  Filled 2018-01-09 (×2): qty 1

## 2018-01-09 NOTE — Plan of Care (Signed)
Patient is presenting in assessment with  irrational emotional worry that persists, triggered by the  information that she will be discharged by Monday, expressing difficulty concentrating and restlessness, patient verbally contract for safety, appetite is poor and energy level is marginal, denies any SI/HI/and AVH at this time , compliant with medication is good , mood and affect is poor, patient is safe in the unit, and 15 minute safety rounds is maintained no distress, education is given  and encouraged ,    Problem: Education: Goal: Knowledge of [place] [facility] Education information/materials will improve Outcome: Progressing   Problem: Safety: Goal: Periods of time without injury will increase Outcome: Progressing   Problem: Education: Goal: Utilization of techniques to improve thought processes will improve Outcome: Progressing Goal: Knowledge of the prescribed therapeutic regimen will improve Outcome: Progressing   Problem: Safety: Goal: Ability to disclose and discuss suicidal ideas will improve Outcome: Progressing Goal: Ability to identify and utilize support systems that promote safety will improve Outcome: Progressing

## 2018-01-09 NOTE — Progress Notes (Addendum)
DAR Note: Pt A & O X4. Denies SI, HI and AVH when assessed. Visible in milieu at intervals during shift. Presents with flat affect on initial encounter, brightens up on interactions. Observed in scheduled groups when prompted and was engaged in discussions. Endorsed anxiety level of 8/10, deperession of 5/10. Pt has been medication compliant, denies side effects at this time.  Support offered to pt throughout this shift. Encouraged pt to voice concerns, attend to ADLs and comply with current treatment regimen. Q 15 minutes safety checks maintained without self harm gestures or outburst to note at this time. Scheduled and PRN (Tylenol & Vistaril) medications given per MD's orders with verbal education and effects monitored. PPD read this shift, 0 mm induration noted at site. Pt receptive to care. Reports relief from anxiety when reassessed post PRN Vistaril. Cooperative with unit routines. Denies concerns at this time. POC maintained for safety and mood stability.

## 2018-01-09 NOTE — Progress Notes (Signed)
[facility] MD Progress Note  01/09/2018 1:07 PM Julie Lambert  MRN:  [id number]  Subjective:    " didn't sleep well" Pt reports feeling better, looking forward to to half way house on Monday. Reports poor sleep, waking up early , with 100 mg trazodone, will increase dose tonight. Pt taking meds denies side effects. Mood improving. Pain abd improved.     She was restarted on Celexa on Thursday. It was helpful in the past.  Principal Problem: Bipolar I disorder, most recent episode depressed, severe without psychotic features ([place]) Diagnosis: Principal Problem:   Bipolar I disorder, most recent episode depressed, severe without psychotic features ([place]) Active Problems:   Diabetes mellitus without complication ([place])   Cocaine use disorder, moderate, dependence ([place])   Tobacco use disorder   Suicidal ideation  Total Time spent with patient: 25 min  Past Psychiatric History: depression, substance abuse  Past Medical History:  Past Medical History:  Diagnosis Date  . Bipolar 2 disorder ([place])   . Diabetes mellitus without complication ([place])   . Diverticulitis   . Diverticulosis   . Uterine fibroid     Past Surgical History:  Procedure Laterality Date  . BACK SURGERY    . CHOLECYSTECTOMY    . LUMBAR SPINE SURGERY     Family History:  Family History  Problem Relation Age of Onset  . CAD Mother   . Diabetes Father    Family Psychiatric  History: see H&P Social History:  Social History   Substance and Sexual Activity  Alcohol Use Yes   Comment: last use 12/03/2017     Social History   Substance and Sexual Activity  Drug Use Yes  . Types: Cocaine, Marijuana    Social History   Socioeconomic History  . Marital status: Divorced    Spouse name: Not on file  . Number of children: Not on file  . Years of education: Not on file  . Highest education level: Not on file  Occupational History  . Not on file  Social Needs  . Financial resource strain: Not on file   . Food insecurity:    Worry: Not on file    Inability: Not on file  . Transportation needs:    Medical: Not on file    Non-medical: Not on file  Tobacco Use  . Smoking status: Current Every Day Smoker    Packs/day: 0.50    Years: 35.00    Pack years: 17.50    Types: Cigarettes  . Smokeless tobacco: Never Used  Substance and Sexual Activity  . Alcohol use: Yes    Comment: last use 12/03/2017  . Drug use: Yes    Types: Cocaine, Marijuana  . Sexual activity: Yes  Lifestyle  . Physical activity:    Days per week: Not on file    Minutes per session: Not on file  . Stress: Not on file  Relationships  . Social connections:    Talks on phone: Not on file    Gets together: Not on file    Attends religious service: Not on file    Active member of club or organization: Not on file    Attends meetings of clubs or organizations: Not on file    Relationship status: Not on file  Other Topics Concern  . Not on file  Social History Narrative  . Not on file   Additional Social History:  Sleep: Fair  Appetite:  Fair  Current Medications: Current Facility-Administered Medications  Medication Dose Route Frequency Provider Last Rate Last Dose  . acetaminophen (TYLENOL) tablet 650 mg  650 mg Oral Q6H PRN Clapacs, [name], MD   650 mg at 01/09/18 0819  . alum & mag hydroxide-simeth (MAALOX/MYLANTA) 200-200-20 MG/5ML suspension 30 mL  30 mL Oral Q4H PRN Clapacs, [name], MD      . ciprofloxacin (CIPRO) tablet 500 mg  500 mg Oral BID [name], RPH   500 mg at 01/09/18 9024  . citalopram (CELEXA) tablet 20 mg  20 mg Oral Daily He, [name], MD   20 mg at 01/09/18 0810  . diclofenac (VOLTAREN) EC tablet 50 mg  50 mg Oral BID [name], [name], MD   50 mg at 01/09/18 0811  . glipiZIDE (GLUCOTROL) tablet 2.5 mg  2.5 mg Oral BID AC Clapacs, [name], MD   2.5 mg at 01/09/18 0973  . hydrOXYzine (ATARAX/VISTARIL) tablet 25 mg  25 mg Oral TID PRN Clapacs, [name], MD   25 mg at 01/09/18 1202  . insulin aspart (novoLOG) injection 0-9 Units  0-9 Units Subcutaneous TID WC Clapacs, [name], MD   1 Units at 01/09/18 [phone]  . insulin aspart protamine- aspart (NOVOLOG MIX 70/30) injection 10 Units  10 Units Subcutaneous BID WC Clapacs, [name], MD   10 Units at 01/09/18 [phone]  . magnesium hydroxide (MILK OF MAGNESIA) suspension 30 mL  30 mL Oral Daily PRN Clapacs, [name], MD   30 mL at 01/04/18 1510  . metFORMIN (GLUCOPHAGE) tablet 1,000 mg  1,000 mg Oral BID WC Clapacs, [name], MD   1,000 mg at 01/09/18 0810  . nicotine polacrilex (NICORETTE) gum 2 mg  2 mg Oral PRN Clapacs, [name], MD   2 mg at 01/08/18 1215  . polyvinyl alcohol (LIQUIFILM TEARS) 1.4 % ophthalmic solution 1 drop  1 drop Both Eyes PRN [name], RPH   1 drop at 01/08/18 1617  . QUEtiapine (SEROQUEL) tablet 100 mg  100 mg Oral QHS Clapacs, [name], MD   100 mg at 01/08/18 2112  . senna-docusate (Senokot-S) tablet 1 tablet  1 tablet Oral QHS [name], [name], MD   1 tablet at 01/08/18 2112  . traZODone (DESYREL) tablet 150 mg  150 mg Oral QHS PRN [name], MD        Lab Results:  Results for orders placed or performed during the hospital encounter of 01/02/18 (from the past 48 hour(s))  Glucose, capillary     Status: None   Collection Time: 01/07/18  4:12 PM  Result Value Ref Range   Glucose-Capillary 88 70 - 99 mg/dL  Lipase, blood     Status: None   Collection Time: 01/07/18  5:03 PM  Result Value Ref Range   Lipase 32 11 - 51 U/L    Comment: Performed at [facility], [address] [place], [place] [address]  Comprehensive metabolic panel     Status: Abnormal   Collection Time: 01/07/18  5:03 PM  Result Value Ref Range   Sodium 139 135 - 145 mmol/L   Potassium 4.4 3.5 - 5.1 mmol/L   Chloride 104 98 - 111 mmol/L   CO2 27 22 - 32 mmol/L   Glucose, Bld 128 (H) 70 - 99 mg/dL   BUN 15 6 - 20 mg/dL   Creatinine, Ser 0.58 0.44 - 1.00 mg/dL   Calcium 9.1 8.9  -  10.3 mg/dL   Total Protein 6.3 (L) 6.5 - 8.1 g/dL   Albumin 3.3 (L) 3.5 - 5.0 g/dL   AST 15 15 - 41 U/L   ALT 15 0 - 44 U/L   Alkaline Phosphatase 71 38 - 126 U/L   Total Bilirubin 0.4 0.3 - 1.2 mg/dL   GFR calc non Af Amer >60 >60 mL/min   GFR calc Af Amer >60 >60 mL/min   Anion gap 8 5 - 15    Comment: Performed at [facility], [address] [place], [place] [address]  CBC     Status: None   Collection Time: 01/07/18  5:03 PM  Result Value Ref Range   WBC 8.8 4.0 - 10.5 K/uL   RBC 4.16 3.87 - 5.11 MIL/uL   Hemoglobin 12.7 12.0 - 15.0 g/dL   HCT 38.4 36.0 - 46.0 %   MCV 92.3 80.0 - 100.0 fL   MCH 30.5 26.0 - 34.0 pg   MCHC 33.1 30.0 - 36.0 g/dL   RDW 12.0 11.5 - 15.5 %   Platelets 245 150 - 400 K/uL   nRBC 0.0 0.0 - 0.2 %    Comment: Performed at [facility], [address] [place], [place] [address]  Glucose, capillary     Status: None   Collection Time: 01/07/18  8:35 PM  Result Value Ref Range   Glucose-Capillary 97 70 - 99 mg/dL  Glucose, capillary     Status: Abnormal   Collection Time: 01/08/18  7:17 AM  Result Value Ref Range   Glucose-Capillary 165 (H) 70 - 99 mg/dL  Glucose, capillary     Status: Abnormal   Collection Time: 01/08/18 11:21 AM  Result Value Ref Range   Glucose-Capillary 140 (H) 70 - 99 mg/dL  Glucose, capillary     Status: Abnormal   Collection Time: 01/08/18  4:16 PM  Result Value Ref Range   Glucose-Capillary 121 (H) 70 - 99 mg/dL  Glucose, capillary     Status: Abnormal   Collection Time: 01/08/18  8:42 PM  Result Value Ref Range   Glucose-Capillary 127 (H) 70 - 99 mg/dL  Glucose, capillary     Status: Abnormal   Collection Time: 01/09/18  6:59 AM  Result Value Ref Range   Glucose-Capillary 148 (H) 70 - 99 mg/dL  Glucose, capillary     Status: Abnormal   Collection Time: 01/09/18 11:22 AM  Result Value Ref Range   Glucose-Capillary 106 (H) 70 - 99 mg/dL    Blood Alcohol level:  Lab Results  Component Value  Date   ETH <10 01/02/2018   ETH <10 37/85/8850    Metabolic Disorder Labs: Lab Results  Component Value Date   HGBA1C 12.5 (H) 12/26/2017   MPG 312.05 12/26/2017   No results found for: PROLACTIN Lab Results  Component Value Date   CHOL 215 (H) 12/26/2017   TRIG 179 (H) 12/26/2017   HDL 65 12/26/2017   CHOLHDL 3.3 12/26/2017   VLDL 36 12/26/2017   LDLCALC 114 (H) 12/26/2017   LDLCALC 79 10/09/2011    Physical Findings: AIMS: Facial and Oral Movements Muscles of Facial Expression: None, normal Lips and Perioral Area: None, normal Jaw: None, normal Tongue: None, normal,Extremity Movements Upper (arms, wrists, hands, fingers): None, normal Lower (legs, knees, ankles, toes): None, normal, Trunk Movements Neck, shoulders, hips: None, normal, Overall Severity Severity of abnormal movements (highest score from questions above): None, normal Incapacitation due to abnormal movements: None, normal Patient's awareness of  abnormal movements (rate only patient's report): No Awareness, Dental Status Current problems with teeth and/or dentures?: No Does patient usually wear dentures?: No  CIWA:    COWS:     Musculoskeletal: Strength & Muscle Tone: within normal limits Gait & Station: normal Patient leans: N/A  Psychiatric Specialty Exam: Physical Exam  Nursing note and vitals reviewed. Psychiatric: Her speech is normal. Her mood appears anxious. Cognition and memory are normal. She expresses impulsivity.    Review of Systems  Neurological: Negative.   Psychiatric/Behavioral: Positive for substance abuse.  All other systems reviewed and are negative.   Blood pressure 128/81, pulse 88, temperature 98.4 F (36.9 C), temperature source Oral, resp. rate 18, height 5' 7.01" (1.702 m), weight 70.8 kg, SpO2 100 %.Body mass index is 24.44 kg/m.  General Appearance: Casual, age appropriate  Eye Contact:  Good  Speech:  Clear and Coherent  Volume:  Normal  Mood:  Anxious  Affect:   Appropriate, anxious  Thought Process:  Goal Directed and Descriptions of Associations: Intact  Orientation:  Full (Time, Place, and Person)  Thought Content:  WDL  Suicidal Thoughts:  No  Homicidal Thoughts:  No  Memory:  Immediate;   Fair Recent;   Fair Remote;   Fair  Judgement:  fair  Insight:  fair  Psychomotor Activity:  Normal  Concentration:  Concentration: Fair and Attention Span: Fair  Recall:  [employer] of Knowledge:  Fair  Language:  Fair  Akathisia:  No  Handed:  Right  AIMS (if indicated):     Assets:  Communication Skills Desire for Improvement Physical Health Resilience Social Support Transportation  ADL's:  Intact  Cognition:  WNL  Sleep:  Number of Hours: 7.5     Treatment Plan Summary: Daily contact with patient to assess and evaluate symptoms and progress in treatment and Medication management   Ms. Prien is a 52 year old female with a history of cocaine addiction and bipolardisorderadmitted for suicidal ideation in the context of relapse on cocaine.Pt reports motivation to remain abstinent and is seeking inpatient treatment for substance abuse.Mood improving .   #Suicidal ideation,resolved -patient is able to contract for safety in the hospital  #Mood(History of Bipolar disorder), improved -continue Seroquel 100 mg nightly -continue Celexa 20 mg daily  - increaseTrazodone 150 mgqhs prn sleep  #Cocaine addiction -desireslong-term esidential substance abuse treatment  #Abdominal pain -medicine input is greatly appreciated -[place] studies negative -Toradol 60 mg IM for pain  #DM, CBG is better controlled - cont insulin, oral diabetes meds.    #Constipation -bowel regimen  #Sciatica -Voltaren 50 mg BID  #Smoking cessation -nicotine patch is available  #Disposition -planned discharge to [facility] program next week -follow up with the local provider   [name], MD 01/09/2018, 1:07 PMPatient ID: Julie Lambert, female   DOB: [birth year], 52 y.o.   MRN: [id number]

## 2018-01-09 NOTE — [facility] Group Notes (Signed)
  LCSW Group Therapy Note  Saturday November 1st 12:30-1:15pm   Type of Therapy/Topic:  Group Therapy:  Feelings on Holding Grudges  Participation Level:  Patient did not attend- resting in room  Description of Group:   This group will allow patients to explore their thoughts and feelings about grudges.Facilitator will encourage patients to process their thoughts and feelings about the reactions of others while dealing with grudges and will guide patients in identifying ways to discuss healing after letting grudges go.This group will be process-oriented, with patients participating in exploration of their own experiences, giving and receiving support, and processing challenge from other group members.   Therapeutic Goals: 1. Patient will identify a grudge they have been dealing with in their personal life 2. Patient will be able to express feelings regarding the grudges they have and how they felt after letting go of a grudge  3. Patient will demonstrate their ability of how to let go of a grudge what worked 4. Patient will identify situations where they have let go of a grudge but did not releaese it totally.  Summary of Patient Progress:    Therapeutic Modalities:   Cognitive Behavioral Therapy Brief Therapy Feelings Identification    Kaithlyn Teagle LCSW  [phone]

## 2018-01-09 NOTE — Plan of Care (Signed)
Patient was cooperative with treatment. She spent most of the evening in the dayroom with peers. She talked about how she spent most of her life taking care of others. She remains sad and depressed. She was medication compliant and interested  well with peers and  staff.

## 2018-01-10 LAB — GLUCOSE, CAPILLARY
Glucose-Capillary: 125 mg/dL — ABNORMAL HIGH (ref 70–99)
Glucose-Capillary: 90 mg/dL (ref 70–99)
Glucose-Capillary: 123 mg/dL — ABNORMAL HIGH (ref 70–99)
Glucose-Capillary: 84 mg/dL (ref 70–99)

## 2018-01-10 MED ORDER — CITALOPRAM HYDROBROMIDE 20 MG PO TABS: 20.0000 mg | ORAL_TABLET | Freq: Every day | ORAL | 1 refills | Status: DC

## 2018-01-10 MED ORDER — GABAPENTIN 600 MG PO TABS
300.0000 mg | ORAL_TABLET | Freq: Three times a day (TID) | ORAL | Status: DC
  Administered 2018-01-10 – 2018-01-11 (×3): 300 mg via ORAL
  Filled 2018-01-10 (×3): qty 1

## 2018-01-10 MED ORDER — GABAPENTIN 600 MG PO TABS: 300.0000 mg | ORAL_TABLET | Freq: Three times a day (TID) | ORAL | 1 refills | Status: DC

## 2018-01-10 MED ORDER — TRAZODONE HCL 150 MG PO TABS: 150.0000 mg | ORAL_TABLET | Freq: Every evening | ORAL | 1 refills | Status: DC | PRN

## 2018-01-10 NOTE — [facility] Group Notes (Signed)
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
## 2018-01-10 NOTE — Progress Notes (Signed)
Patient refused her 1 unit of Novolog meal coverage because "that's a waste of a needle". This Probation officer will notify MD.

## 2018-01-10 NOTE — Plan of Care (Signed)
Anxious and helpless but compliant with treatment. Visible in the milieu and denying suicidal thoughts.

## 2018-01-10 NOTE — Consult Note (Signed)
Pharmacy Antibiotic Note  Julie Lambert is a 52 y.o. female admitted on 01/02/2018 with UTI.  Pharmacy has been consulted for Cipro dosing.  Plan: Ciprofloxacin 500 mg PO BID for 3 days per Dr Estanislado Pandy. Final dose tomorrow AM. Pharmacy will sign off. Please re-consult if further assistance is desired.   Height: 5' 7.01" (170.2 cm) Weight: 156 lb 1.4 oz (70.8 kg) IBW/kg (Calculated) : 61.62  Temp (24hrs), Avg:98.6 F (37 C), Min:98.6 F (37 C), Max:98.6 F (37 C)  Recent Labs  Lab 01/07/18 1703  WBC 8.8  CREATININE 0.58    Estimated Creatinine Clearance: 79.1 mL/min (by C-G formula based on SCr of 0.58 mg/dL).    Allergies  Allergen Reactions  . Iodinated Diagnostic Agents Itching    Patient stated she had itching on her back after IV injection    Antimicrobials this admission: ciprofloxacin 11/29 >>   Microbiology results: 11/18 UCx: recollect   Thank you for allowing pharmacy to be a part of this patient's care.  Laural Benes, PharmD 01/10/2018 12:22 PM

## 2018-01-10 NOTE — Progress Notes (Signed)
[facility] MD Progress Note  01/10/2018 12:18 PM Julie Lambert  MRN:  [id number]  Subjective:    I want my neurontin back,  I am having nerve pain and burning of body" Pt wanting neurontin for nerves/burning and tingling  of body. Reports right ckked redness but nothing at thsi time, denies SOB, any other rash, will monitor. Pt anxious,  Pt going [place]  half way house on Monday. Reports poor sleep, slept 5.45 hrs with 150mg  trazodone.   Pt taking meds denies side effects.    Principal Problem: Bipolar I disorder, most recent episode depressed, severe without psychotic features ([name]) Diagnosis: Principal Problem:   Bipolar I disorder, most recent episode depressed, severe without psychotic features ([name]) Active Problems:   Diabetes mellitus without complication ([name])   Cocaine use disorder, moderate, dependence ([name])   Tobacco use disorder   Suicidal ideation  Total Time spent with patient: 25 min  Past Psychiatric History: depression, substance abuse  Past Medical History:  Past Medical History:  Diagnosis Date  . Bipolar 2 disorder ([name])   . Diabetes mellitus without complication ([name])   . Diverticulitis   . Diverticulosis   . Uterine fibroid     Past Surgical History:  Procedure Laterality Date  . BACK SURGERY    . CHOLECYSTECTOMY    . LUMBAR SPINE SURGERY     Family History:  Family History  Problem Relation Age of Onset  . CAD Mother   . Diabetes Father    Family Psychiatric  History: see H&P Social History:  Social History   Substance and Sexual Activity  Alcohol Use Yes   Comment: last use 12/03/2017     Social History   Substance and Sexual Activity  Drug Use Yes  . Types: Cocaine, Marijuana    Social History   Socioeconomic History  . Marital status: Divorced    Spouse name: Not on file  . Number of children: Not on file  . Years of education: Not on file  . Highest education level: Not on file  Occupational History  . Not on file  Social  Needs  . Financial resource strain: Not on file  . Food insecurity:    Worry: Not on file    Inability: Not on file  . Transportation needs:    Medical: Not on file    Non-medical: Not on file  Tobacco Use  . Smoking status: Current Every Day Smoker    Packs/day: 0.50    Years: 35.00    Pack years: 17.50    Types: Cigarettes  . Smokeless tobacco: Never Used  Substance and Sexual Activity  . Alcohol use: Yes    Comment: last use 12/03/2017  . Drug use: Yes    Types: Cocaine, Marijuana  . Sexual activity: Yes  Lifestyle  . Physical activity:    Days per week: Not on file    Minutes per session: Not on file  . Stress: Not on file  Relationships  . Social connections:    Talks on phone: Not on file    Gets together: Not on file    Attends religious service: Not on file    Active member of club or organization: Not on file    Attends meetings of clubs or organizations: Not on file    Relationship status: Not on file  Other Topics Concern  . Not on file  Social History Narrative  . Not on file   Additional Social History:  Sleep: Fair  Appetite:  Fair  Current Medications: Current Facility-Administered Medications  Medication Dose Route Frequency Provider Last Rate Last Dose  . acetaminophen (TYLENOL) tablet 650 mg  650 mg Oral Q6H PRN Clapacs, [name], MD   650 mg at 01/09/18 0819  . alum & mag hydroxide-simeth (MAALOX/MYLANTA) 200-200-20 MG/5ML suspension 30 mL  30 mL Oral Q4H PRN Clapacs, [name], MD      . ciprofloxacin (CIPRO) tablet 500 mg  500 mg Oral BID [name], RPH   500 mg at 01/10/18 1610  . citalopram (CELEXA) tablet 20 mg  20 mg Oral Daily He, [name], MD   20 mg at 01/10/18 0803  . diclofenac (VOLTAREN) EC tablet 50 mg  50 mg Oral BID [name], [name], MD   50 mg at 01/10/18 0803  . glipiZIDE (GLUCOTROL) tablet 2.5 mg  2.5 mg Oral BID AC Clapacs, [name], MD   2.5 mg at 01/10/18 0804  . hydrOXYzine  (ATARAX/VISTARIL) tablet 25 mg  25 mg Oral TID PRN Clapacs, [name], MD   25 mg at 01/09/18 2110  . insulin aspart (novoLOG) injection 0-9 Units  0-9 Units Subcutaneous TID WC Clapacs, [name], MD   1 Units at 01/09/18 1700  . insulin aspart protamine- aspart (NOVOLOG MIX 70/30) injection 10 Units  10 Units Subcutaneous BID WC Clapacs, [name], MD   10 Units at 01/10/18 0802  . magnesium hydroxide (MILK OF MAGNESIA) suspension 30 mL  30 mL Oral Daily PRN Clapacs, [name], MD   30 mL at 01/04/18 1510  . metFORMIN (GLUCOPHAGE) tablet 1,000 mg  1,000 mg Oral BID WC Clapacs, [name], MD   1,000 mg at 01/10/18 0803  . nicotine polacrilex (NICORETTE) gum 2 mg  2 mg Oral PRN Clapacs, [name], MD   2 mg at 01/10/18 1132  . polyvinyl alcohol (LIQUIFILM TEARS) 1.4 % ophthalmic solution 1 drop  1 drop Both Eyes PRN [name], RPH   1 drop at 01/08/18 1617  . QUEtiapine (SEROQUEL) tablet 100 mg  100 mg Oral QHS Clapacs, [name], MD   100 mg at 01/09/18 2109  . senna-docusate (Senokot-S) tablet 1 tablet  1 tablet Oral QHS [name], [name], MD   1 tablet at 01/09/18 2110  . traZODone (DESYREL) tablet 150 mg  150 mg Oral QHS PRN [name], MD   150 mg at 01/09/18 2110    Lab Results:  Results for orders placed or performed during the hospital encounter of 01/02/18 (from the past 48 hour(s))  Glucose, capillary     Status: Abnormal   Collection Time: 01/08/18  4:16 PM  Result Value Ref Range   Glucose-Capillary 121 (H) 70 - 99 mg/dL  Glucose, capillary     Status: Abnormal   Collection Time: 01/08/18  8:42 PM  Result Value Ref Range   Glucose-Capillary 127 (H) 70 - 99 mg/dL  Glucose, capillary     Status: Abnormal   Collection Time: 01/09/18  6:59 AM  Result Value Ref Range   Glucose-Capillary 148 (H) 70 - 99 mg/dL  Glucose, capillary     Status: Abnormal   Collection Time: 01/09/18 11:22 AM  Result Value Ref Range   Glucose-Capillary 106 (H) 70 - 99 mg/dL  Glucose, capillary     Status:  Abnormal   Collection Time: 01/09/18  4:11 PM  Result Value Ref Range   Glucose-Capillary 147 (H) 70 - 99 mg/dL  Glucose, capillary  Status: None   Collection Time: 01/09/18  7:51 PM  Result Value Ref Range   Glucose-Capillary 74 70 - 99 mg/dL  Glucose, capillary     Status: Abnormal   Collection Time: 01/10/18  7:12 AM  Result Value Ref Range   Glucose-Capillary 125 (H) 70 - 99 mg/dL   Comment 1 Notify RN   Glucose, capillary     Status: None   Collection Time: 01/10/18 11:34 AM  Result Value Ref Range   Glucose-Capillary 90 70 - 99 mg/dL    Blood Alcohol level:  Lab Results  Component Value Date   ETH <10 01/02/2018   ETH <10 63/02/6008    Metabolic Disorder Labs: Lab Results  Component Value Date   HGBA1C 12.5 (H) 12/26/2017   MPG 312.05 12/26/2017   No results found for: PROLACTIN Lab Results  Component Value Date   CHOL 215 (H) 12/26/2017   TRIG 179 (H) 12/26/2017   HDL 65 12/26/2017   CHOLHDL 3.3 12/26/2017   VLDL 36 12/26/2017   LDLCALC 114 (H) 12/26/2017   LDLCALC 79 10/09/2011    Physical Findings: AIMS: Facial and Oral Movements Muscles of Facial Expression: None, normal Lips and Perioral Area: None, normal Jaw: None, normal Tongue: None, normal,Extremity Movements Upper (arms, wrists, hands, fingers): None, normal Lower (legs, knees, ankles, toes): None, normal, Trunk Movements Neck, shoulders, hips: None, normal, Overall Severity Severity of abnormal movements (highest score from questions above): None, normal Incapacitation due to abnormal movements: None, normal Patient's awareness of abnormal movements (rate only patient's report): No Awareness, Dental Status Current problems with teeth and/or dentures?: No Does patient usually wear dentures?: No  CIWA:    COWS:     Musculoskeletal: Strength & Muscle Tone: within normal limits Gait & Station: normal Patient leans: N/A  Psychiatric Specialty Exam: Physical Exam  Nursing note and  vitals reviewed. Psychiatric: Her speech is normal. Her mood appears anxious. Cognition and memory are normal. She expresses impulsivity.    Review of Systems  Neurological: Negative.   Psychiatric/Behavioral: Positive for substance abuse.  All other systems reviewed and are negative.   Blood pressure 131/80, pulse (!) 102, temperature 98.6 F (37 C), temperature source Oral, resp. rate 18, height 5' 7.01" (1.702 m), weight 70.8 kg, SpO2 100 %.Body mass index is 24.44 kg/m.  General Appearance: Casual, age appropriate  Eye Contact:  Good  Speech:  Clear and Coherent  Volume:  Normal  Mood:  Anxious  Affect:   anxious  Thought Process:  Goal Directed and Descriptions of Associations: Intact  Orientation:  Full (Time, Place, and Person)  Thought Content:  somatic  Suicidal Thoughts:  No  Homicidal Thoughts:  No  Memory:  Immediate;   Fair Recent;   Fair Remote;   Fair  Judgement:  fair  Insight:  fair  Psychomotor Activity:  Normal  Concentration:  Concentration: Fair and Attention Span: Fair  Recall:  [employer] of Knowledge:  Fair  Language:  Fair  Akathisia:  No  Handed:  Right  AIMS (if indicated):     Assets:  Communication Skills Desire for Improvement Physical Health Resilience Social Support Transportation  ADL's:  Intact  Cognition:  WNL  Sleep:  Number of Hours: 5.45     Treatment Plan Summary: Daily contact with patient to assess and evaluate symptoms and progress in treatment and Medication management   Ms. Lawn is a 52 year old female with a history of cocaine addiction and bipolardisorderadmitted for suicidal ideation in the  context of relapse on cocaine.Pt reports motivation to remain abstinent and is seeking inpatient treatment for substance abuse.Mood improving but anxious .   start neurontin for anxiety, pain #Suicidal ideation,resolved -patient is able to contract for safety in the hospital  #Mood(History of Bipolar disorder),  improved -continue Seroquel 100 mg nightly -continue Celexa 20 mg daily  - increaseTrazodone 200 mgqhs prn sleep  #Cocaine addiction -desireslong-term esidential substance abuse treatment  #Abdominal pain -medicine input is greatly appreciated -[place] studies negative -Toradol 60 mg IM for pain  #DM, CBG is better controlled - cont insulin, oral diabetes meds.    #Constipation -bowel regimen  #Sciatica -Voltaren 50 mg BID  #Smoking cessation -nicotine patch is available  #Disposition -planned discharge to [facility] program next week -follow up with the local provider   [name], MD 01/10/2018, 12:18 PMPatient ID: Aliene Beams, female   DOB: December 18, 1965, 52 y.o.   MRN: [id number] Patient ID: Karem Farha, female   DOB: April 27, 1965, 52 y.o.   MRN: [id number]

## 2018-01-10 NOTE — Progress Notes (Signed)
D- Patient alert and oriented. Patient presents in a sullen, but pleasant mood on assessment stating to this writer that she "tossed and turned" last night and has been up since 1:30 am. Patient reports lower back pain of "10//10" stating to this writer "I think it gets worse after I eat", but did not request any pain medication from this writer, but states that she will request Gabapentin from the MD. Patient denies any anxiety, however, she rated her depression a "5/10" stating that "it's better than it was before and I have to learn to accept the things that I can not change". Patient also denies SI, HI, AVH, at this time. Patient's goal for today is to "sleep and see if I can still get discharged tomorrow and go to [facility] Tuesday because I have a lot of things that I need to handle before I go".  A- Scheduled medications administered to patient, per MD orders. Support and encouragement provided.  Routine safety checks conducted every 15 minutes.  Patient informed to notify staff with problems or concerns.  R- No adverse drug reactions noted. Patient contracts for safety at this time. Patient compliant with medications and treatment plan. Patient receptive, calm, and cooperative. Patient interacts well with others on the unit.  Patient remains safe at this time.

## 2018-01-11 LAB — GLUCOSE, CAPILLARY
Glucose-Capillary: 181 mg/dL — ABNORMAL HIGH (ref 70–99)
Glucose-Capillary: 41 mg/dL — CL (ref 70–99)
Glucose-Capillary: 71 mg/dL (ref 70–99)

## 2018-01-11 MED ORDER — METFORMIN HCL 1000 MG PO TABS: 1000.0000 mg | ORAL_TABLET | Freq: Two times a day (BID) | ORAL | 1 refills | Status: DC

## 2018-01-11 MED ORDER — INSULIN ASPART PROT & ASPART (70-30 MIX) 100 UNIT/ML ~~LOC~~ SUSP: 10.0000 [IU] | Freq: Two times a day (BID) | SUBCUTANEOUS | 1 refills | Status: DC

## 2018-01-11 MED ORDER — GABAPENTIN 600 MG PO TABS: 300.0000 mg | ORAL_TABLET | Freq: Three times a day (TID) | ORAL | 1 refills | Status: DC

## 2018-01-11 MED ORDER — GLIPIZIDE 5 MG PO TABS: 2.5000 mg | ORAL_TABLET | Freq: Two times a day (BID) | ORAL | 1 refills | Status: DC

## 2018-01-11 MED ORDER — TRAZODONE HCL 150 MG PO TABS: 150.0000 mg | ORAL_TABLET | Freq: Every evening | ORAL | 1 refills | Status: DC | PRN

## 2018-01-11 NOTE — Progress Notes (Signed)
Hypoglycemic Event  CBG: 41   Treatment: 15 GM carbohydrate snack  Symptoms: None  Follow-up CBG: Time: 11:34 CBG Result: 71    Possible Reasons for Event: Inadequate meal intake  Comments/MD notified: MD notifed at 11:18am. No new orders received. Hypoglycemia protocol followed per routine orders. Pt. Now currently eating lunch. No additional intervention needed at this time. Will continue to monitor per routine for safety.   Julie Lambert

## 2018-01-11 NOTE — Progress Notes (Signed)
Recreation Therapy Notes  INPATIENT RECREATION TR PLAN  Patient Details Name: Julie Lambert MRN: [id number] DOB: 1966/01/20 Today's Date: 01/11/2018  Rec Therapy Plan Is patient appropriate for Therapeutic Recreation?: Yes Treatment times per week: at least 3 Estimated Length of Stay: 5-7 days TR Treatment/Interventions: Group participation (Comment)  Discharge Criteria Pt will be discharged from therapy if:: Discharged Treatment plan/goals/alternatives discussed and agreed upon by:: Patient/family  Discharge Summary Short term goals set: Patient will successfully identify 2 ways of making healthy decisions post d/c within 5 recreation therapy group sessions Short term goals met: Complete Progress toward goals comments: Groups attended Which groups?: Other (Comment), Leisure education(Values, Time management, Relaxation ) Reason goals not met: N/A Therapeutic equipment acquired: N/A Reason patient discharged from therapy: Discharge from hospital Pt/family agrees with progress & goals achieved: Yes Date patient discharged from therapy: 01/11/18   Neva Ramaswamy 01/11/2018, 12:50 PM

## 2018-01-11 NOTE — Progress Notes (Signed)
Patient was visible in the milieu until bedtime. Frequently seen in the hallway. Interacting with staff and peers appropriately. Expressed anxiety related to upcoming discharge "I've got to do better...there is no other choice". Mentioned that she would not take a laxative again secondary to what she experienced last night " I had a bad one, all over me...and had to change my bed twice...". Patient received bedtime medications including Trazodone 150 mg and had a snack. CBG 84. Patient slept throughout the night with no discomfort. Safety and security monitored per 15 mn level of observations.

## 2018-01-11 NOTE — BHH Suicide Risk Assessment (Signed)
[facility] Discharge Suicide Risk Assessment   Principal Problem: Bipolar I disorder, most recent episode depressed, severe without psychotic features ([place]) Discharge Diagnoses: Principal Problem:   Bipolar I disorder, most recent episode depressed, severe without psychotic features ([place]) Active Problems:   Diabetes mellitus without complication ([place])   Cocaine use disorder, moderate, dependence ([place])   Tobacco use disorder   Suicidal ideation   Total Time spent with patient: 20 minutes  Musculoskeletal: Strength & Muscle Tone: within normal limits Gait & Station: normal Patient leans: N/A  Psychiatric Specialty Exam: Review of Systems  Neurological: Negative.   Psychiatric/Behavioral: Positive for substance abuse.  All other systems reviewed and are negative.   Blood pressure (!) 142/94, pulse 91, temperature 97.8 F (36.6 C), temperature source Oral, resp. rate 18, height 5' 7.01" (1.702 m), weight 70.8 kg, SpO2 99 %.Body mass index is 24.44 kg/m.  General Appearance: Casual  Eye Contact::  Good  Speech:  Clear and Coherent409  Volume:  Normal  Mood:  Euthymic  Affect:  Appropriate  Thought Process:  Goal Directed and Descriptions of Associations: Intact  Orientation:  Full (Time, Place, and Person)  Thought Content:  WDL  Suicidal Thoughts:  No  Homicidal Thoughts:  No  Memory:  Immediate;   Fair Recent;   Fair Remote;   Fair  Judgement:  Poor  Insight:  Lacking  Psychomotor Activity:  Normal  Concentration:  Fair  Recall:  [employer] of [place]  Language: Fair  Akathisia:  No  Handed:  Right  AIMS (if indicated):     Assets:  Communication Skills Desire for Improvement Housing Physical Health Resilience Social Support  Sleep:  Number of Hours: 5.45  Cognition: WNL  ADL's:  Intact   Mental Status Per Nursing Assessment::   On Admission:  NA  Demographic Factors:  Unemployed  Loss Factors: Financial problems/change in socioeconomic  status  Historical Factors: Family history of mental illness or substance abuse and Impulsivity  Risk Reduction Factors:   Sense of responsibility to family and Positive social support  Continued Clinical Symptoms:  Depression:   Comorbid alcohol abuse/dependence Impulsivity Alcohol/Substance Abuse/Dependencies  Cognitive Features That Contribute To Risk:  None    Suicide Risk:  Minimal: No identifiable suicidal ideation.  Patients presenting with no risk factors but with morbid ruminations; may be classified as minimal risk based on the severity of the depressive symptoms  Follow-up [employer] Follow up.   Specialty:  Addiction Medicine Contact information: [place] [id number] [phone]           Plan Of Care/Follow-up recommendations:  Activity:  as tolerated Diet:  low sodium heart healthy ADA diet Other:  keep follow up appointments  Orson Slick, MD 01/11/2018, 9:50 AM

## 2018-01-11 NOTE — Discharge Summary (Signed)
Physician Discharge Summary Note  Patient:  Julie Lambert is an 52 y.o., female MRN:  [id number] DOB:  [name] 14, 1965 Patient phone:  [phone] (home)  Patient address:   [address] [place] [address],  Total Time spent with patient: 20 minutes plus 20 min on care coordination and documentation  Date of Admission:  01/02/2018 Date of Discharge: 01/11/2018  Reason for Admission:  Suicidal ideation.  History of Present Illness: Patient was discharged from the hospital psychiatric ward on the 21st with a plan to go to the [facility] drug abuse treatment [facility].  She says she got there and felt uncomfortable.  The food that they served her was too cold.  The people were unpleasant.  She decided that she would leave and so she impulsively signed out.  Of course she went out and smoked crack cocaine and then felt even more depressed which is how she presented to the emergency room.  Says she is having intrusive thoughts of suicide without a specific plan.  Mood feels out of control anxious and depressed.  No report of any hallucinations.  No specific new physical problems other than having a yeast infection.  Associated Signs/Symptoms: Depression Symptoms:  depressed mood, anhedonia, psychomotor agitation, feelings of worthlessness/guilt, hopelessness, suicidal thoughts without plan, (Hypo) Manic Symptoms:  Impulsivity, Anxiety Symptoms:  Excessive Worry, Psychotic Symptoms:  None PTSD Symptoms: Negative  Past Psychiatric History: Patient has a history of recurrent depression with suicidal ideation with a diagnosis of bipolar disorder.  Also has a history of cocaine abuse with recurrent relapses.  Has had substance abuse treatment in the past but recently has difficulty maintaining compliance  Family Psychiatric  History: None noted  Social History: Currently homeless, unemployed and uninsured.  Principal Problem: Bipolar I disorder, most recent episode depressed, severe  without psychotic features [facility]) Discharge Diagnoses: Principal Problem:   Bipolar I disorder, most recent episode depressed, severe without psychotic features ([place]) Active Problems:   Diabetes mellitus without complication ([place])   Cocaine use disorder, moderate, dependence ([place])   Tobacco use disorder   Suicidal ideation    Past Medical History:  Past Medical History:  Diagnosis Date  . Bipolar 2 disorder ([place])   . Diabetes mellitus without complication ([place])   . Diverticulitis   . Diverticulosis   . Uterine fibroid     Past Surgical History:  Procedure Laterality Date  . BACK SURGERY    . CHOLECYSTECTOMY    . LUMBAR SPINE SURGERY     Family History:  Family History  Problem Relation Age of Onset  . CAD Mother   . Diabetes Father    Social History:  Social History   Substance and Sexual Activity  Alcohol Use Yes   Comment: last use 12/03/2017     Social History   Substance and Sexual Activity  Drug Use Yes  . Types: Cocaine, Marijuana    Social History   Socioeconomic History  . Marital status: Divorced    Spouse name: Not on file  . Number of children: Not on file  . Years of education: Not on file  . Highest education level: Not on file  Occupational History  . Not on file  Social Needs  . Financial resource strain: Not on file  . Food insecurity:    Worry: Not on file    Inability: Not on file  . Transportation needs:    Medical: Not on file    Non-medical: Not on file  Tobacco  Use  . Smoking status: Current Every Day Smoker    Packs/day: 0.50    Years: 35.00    Pack years: 17.50    Types: Cigarettes  . Smokeless tobacco: Never Used  Substance and Sexual Activity  . Alcohol use: Yes    Comment: last use 12/03/2017  . Drug use: Yes    Types: Cocaine, Marijuana  . Sexual activity: Yes  Lifestyle  . Physical activity:    Days per week: Not on file    Minutes per session: Not on file  . Stress: Not on file  Relationships  . Social  connections:    Talks on phone: Not on file    Gets together: Not on file    Attends religious service: Not on file    Active member of club or organization: Not on file    Attends meetings of clubs or organizations: Not on file    Relationship status: Not on file  Other Topics Concern  . Not on file  Social History Narrative  . Not on file    Hospital Course:    Julie Lambert is a 52 year old female with a history of cocaine addiction and bipolardisorderadmitted for suicidal ideation in the context of relapse on cocaine.Pt reports motivation to remain abstinent and is seeking inpatient treatment for substance abuse.She was restarted on medication which she tolerated well. At the time of discharge, the patient is no longer suicidal or homicidal. She is able to contract for safety. She is forward thinking and optimistic about the future.   #Mood(History of Bipolar disorder), improved -continue Seroquel 100 mg nightly -continue Celexa 20 mg daily  -Trazodone 150 mg nightly  #Cocaine addiction -desireslong-term esidential substance abuse treatment  #Abdominal pain, resolved -medicine input is greatly appreciated -[place] studies negative  #DM, stable -Novolog 70/30 10 units BID -Glipizide 2.5 mg BID -Metformin 1000 mg BID -ADA diet -Neurontin 300 mg TID  #Sciatica -Voltaren 50 mg BID  #Smoking cessation -nicotine patch is available  #Disposition -discharge to [facility] program next week -follow up with the local provider  Physical Findings: AIMS: Facial and Oral Movements Muscles of Facial Expression: None, normal Lips and Perioral Area: None, normal Jaw: None, normal Tongue: None, normal,Extremity Movements Upper (arms, wrists, hands, fingers): None, normal Lower (legs, knees, ankles, toes): None, normal, Trunk Movements Neck, shoulders, hips: None, normal, Overall Severity Severity of abnormal movements (highest score from questions above): None,  normal Incapacitation due to abnormal movements: None, normal Patient's awareness of abnormal movements (rate only patient's report): No Awareness, Dental Status Current problems with teeth and/or dentures?: No Does patient usually wear dentures?: No  CIWA:    COWS:     Musculoskeletal: Strength & Muscle Tone: within normal limits Gait & Station: normal Patient leans: N/A  Psychiatric Specialty Exam: Physical Exam  Nursing note and vitals reviewed. Psychiatric: She has a normal mood and affect. Her speech is normal and behavior is normal. Thought content normal. Cognition and memory are normal. She expresses impulsivity.    Review of Systems  Neurological: Negative.   Psychiatric/Behavioral: Positive for substance abuse.  All other systems reviewed and are negative.   Blood pressure (!) 142/94, pulse 91, temperature 97.8 F (36.6 C), temperature source Oral, resp. rate 18, height 5' 7.01" (1.702 m), weight 70.8 kg, SpO2 99 %.Body mass index is 24.44 kg/m.  General Appearance: Casual  Eye Contact:  Good  Speech:  Clear and Coherent  Volume:  Normal  Mood:  Euthymic  Affect:  Appropriate  Thought Process:  Goal Directed and Descriptions of Associations: Intact  Orientation:  Full (Time, Place, and Person)  Thought Content:  WDL  Suicidal Thoughts:  No  Homicidal Thoughts:  No  Memory:  Immediate;   Fair Recent;   Fair Remote;   Fair  Judgement:  Poor  Insight:  Lacking  Psychomotor Activity:  Normal  Concentration:  Concentration: Fair and Attention Span: Fair  Recall:  [employer] of Knowledge:  Fair  Language:  Fair  Akathisia:  No  Handed:  Right  AIMS (if indicated):     Assets:  Communication Skills Desire for Improvement Housing Physical Health Resilience Social Support  ADL's:  Intact  Cognition:  WNL  Sleep:  Number of Hours: 5.45        Has this patient used any form of tobacco in the last 30 days? (Cigarettes, Smokeless Tobacco, Cigars, and/or  Pipes) Yes, Yes, A prescription for an FDA-approved tobacco cessation medication was offered at discharge and the patient refused  Blood Alcohol level:  Lab Results  Component Value Date   [facility] <10 01/02/2018   ETH <10 32/99/2426    Metabolic Disorder Labs:  Lab Results  Component Value Date   HGBA1C 12.5 (H) 12/26/2017   MPG 312.05 12/26/2017   No results found for: PROLACTIN Lab Results  Component Value Date   CHOL 215 (H) 12/26/2017   TRIG 179 (H) 12/26/2017   HDL 65 12/26/2017   CHOLHDL 3.3 12/26/2017   VLDL 36 12/26/2017   LDLCALC 114 (H) 12/26/2017   LDLCALC 79 10/09/2011    See Psychiatric Specialty Exam and Suicide Risk Assessment completed by Attending Physician prior to discharge.  Discharge destination:  Other:  [facility]  Is patient on multiple antipsychotic therapies at discharge:  No   Has Patient had three or more failed trials of antipsychotic monotherapy by history:  No  Recommended Plan for Multiple Antipsychotic Therapies: NA  Discharge Instructions    Diet - low sodium heart healthy   Complete by:  As directed    Diet - low sodium heart healthy   Complete by:  As directed    Increase activity slowly   Complete by:  As directed    Increase activity slowly   Complete by:  As directed      Allergies as of 01/11/2018      Reactions   Iodinated Diagnostic Agents Itching   Patient stated she had itching on her back after IV injection      Medication List    STOP taking these medications   albuterol 108 (90 Base) MCG/ACT inhaler Commonly known as:  PROVENTIL HFA;VENTOLIN HFA     TAKE these medications     Indication  blood glucose meter kit and supplies Dispense based on patient and insurance preference. Use up to four times daily as directed. (FOR ICD-10 E10.9, E11.9).  Indication:  Diabetes   citalopram 20 MG tablet Commonly known as:  CELEXA Take 1 tablet (20 mg total) by mouth daily.  Indication:  Depression   diclofenac 50 MG EC  tablet Commonly known as:  VOLTAREN Take 1 tablet (50 mg total) by mouth 2 (two) times daily.  Indication:  Backache   gabapentin 600 MG tablet Commonly known as:  NEURONTIN Take 0.5 tablets (300 mg total) by mouth 3 (three) times daily.  Indication:  Diabetes with Nerve Disease   glipiZIDE 5 MG tablet Commonly known as:  GLUCOTROL Take 0.5 tablets (2.5 mg total) by mouth 2 (two)  times daily before a meal.  Indication:  Type 2 Diabetes   hydrOXYzine 25 MG tablet Commonly known as:  ATARAX/VISTARIL Take 1 tablet (25 mg total) by mouth 3 (three) times daily as needed for anxiety.  Indication:  Feeling Anxious   insulin aspart protamine- aspart (70-30) 100 UNIT/ML injection Commonly known as:  NOVOLOG MIX 70/30 Inject 0.1 mLs (10 Units total) into the skin 2 (two) times daily with a meal.  Indication:  Type 2 Diabetes   metFORMIN 1000 MG tablet Commonly known as:  GLUCOPHAGE Take 1 tablet (1,000 mg total) by mouth 2 (two) times daily with a meal.  Indication:  Type 2 Diabetes   QUEtiapine 100 MG tablet Commonly known as:  SEROQUEL Take 1 tablet (100 mg total) by mouth at bedtime. What changed:    medication strength  how much to take  Indication:  Depressive Phase of Manic-Depression   traZODone 150 MG tablet Commonly known as:  DESYREL Take 1 tablet (150 mg total) by mouth at bedtime as needed for sleep.  Indication:  [place] Follow up.   Specialty:  Addiction Medicine Contact information: [address] [place] [address] [phone]           Follow-up recommendations:  Activity:  as tolerated Diet:  low sodium heart healthy ADA diet Other:  keep follow up appointments  Comments:    Signed: Orson Slick, MD 01/11/2018, 9:53 AM

## 2018-01-11 NOTE — Progress Notes (Signed)
Recreation Therapy Notes  Date: 01/11/2018  Time: 9:30 am  Location: Craft Room  Behavioral response: Appropriate   Intervention Topic: Values  Discussion/Intervention:  Group content today was focused on values. The group identified what values are and where they come from. Individuals expressed some values and how many they have. Patients described how they go about add or removing values. The group described the importance of having values and how they go about using them in daily life. Patient participated in the intervention "My Values" where they were able to pick out values that were important to them and make a visual aide.  Clinical Observations/Feedback:  Patient came to group and expressed that self-respect and tradition are values that are important to her. She stated that values come from how you were raised.Participant a value she would like to add is going back to school. Patient described that values keeps her positive and that she is learning more about herself day by day. Individual was social with peers and staff while participating in the intervention. Ordell Prichett LRT/CTRS           Julie Lambert 01/11/2018 10:24 AM

## 2018-01-11 NOTE — Progress Notes (Signed)
D:Patient denies SI/HI at this time, contracts for safety. Pt appears calm and cooperative, and no distress noted.  A: All Personal items in locker returned to pt. Pt escorted out of the building by staff for safety. Pt. Given extensive discharge education.   R:  Pt States she will comply with outpatient services, and take MEDS as prescribed.

## 2018-01-12 ENCOUNTER — Telehealth: Payer: Self-pay | Admitting: Psychiatry

## 2018-01-12 NOTE — Telephone Encounter (Signed)
Received a request from [facility] for a physician's order for pt to have simethicone 80 mg QID PRN for gas and omeprazole 20 mg qd for GERD.   I spoke with Lorenza Evangelist, the Executive and Clinical Director, regarding the request.  Pt has Rx for the above medications but can not take them unless a doctor's order or physician statement has been provided.   I completed the physician's statement and provided it to the [facility] social worker who will fax the form to [facility].    Tennis Ship, M.D.  Attending Psychiatrist

## 2018-01-13 DIAGNOSIS — E119 Type 2 diabetes mellitus without complications: Secondary | ICD-10-CM

## 2018-01-13 LAB — GLUCOSE, POCT (MANUAL RESULT ENTRY): POC Glucose: 150 mg/dl — AB (ref 70–99)

## 2018-01-13 NOTE — Congregational Nurse Program (Signed)
S= Pt presents to clinic to link with PCP in order to assist with continuing to receive diabetes medications and for maintenace purpose. States she was referred by [facility] (substance abuse recovery facility)  O= Vitals signs checked, Resp 18; %Spo2=96%; Random Blood Glucose taken (less than 45 mins after eating snack)  A= Pt concerned about how she will continue to get diabetes meds after her 30 day refill has becomes exhausted  P=Referred to [facility](RN Case           Manager, Elfredia Nevins and Eligibility Worker)    -Educated on how to stay healthy as a    diabeteic  --Chief Strategy Officer, Elfredia Nevins counseled pt on increased H2O, eat fresh fruits like apples and leafy vegetables.    -RN Case Manager, Elfredia Nevins made referral and [facility]19[facility]18 at 1:15pm

## 2018-01-19 ENCOUNTER — Encounter: Payer: Self-pay | Admitting: Physician Assistant

## 2018-01-19 ENCOUNTER — Ambulatory Visit: Payer: Self-pay | Admitting: Physician Assistant

## 2018-01-19 VITALS — BP 144/91 | HR 93 | Temp 98.1°F | Ht 66.75 in | Wt 186.0 lb

## 2018-01-19 DIAGNOSIS — F17200 Nicotine dependence, unspecified, uncomplicated: Secondary | ICD-10-CM

## 2018-01-19 DIAGNOSIS — F1011 Alcohol abuse, in remission: Secondary | ICD-10-CM

## 2018-01-19 DIAGNOSIS — Z7689 Persons encountering health services in other specified circumstances: Secondary | ICD-10-CM

## 2018-01-19 DIAGNOSIS — E1140 Type 2 diabetes mellitus with diabetic neuropathy, unspecified: Secondary | ICD-10-CM

## 2018-01-19 DIAGNOSIS — B353 Tinea pedis: Secondary | ICD-10-CM

## 2018-01-19 DIAGNOSIS — E1165 Type 2 diabetes mellitus with hyperglycemia: Secondary | ICD-10-CM

## 2018-01-19 DIAGNOSIS — F39 Unspecified mood [affective] disorder: Secondary | ICD-10-CM

## 2018-01-19 DIAGNOSIS — F1411 Cocaine abuse, in remission: Secondary | ICD-10-CM

## 2018-01-19 DIAGNOSIS — K219 Gastro-esophageal reflux disease without esophagitis: Secondary | ICD-10-CM

## 2018-01-19 DIAGNOSIS — R030 Elevated blood-pressure reading, without diagnosis of hypertension: Secondary | ICD-10-CM

## 2018-01-19 DIAGNOSIS — Z1239 Encounter for other screening for malignant neoplasm of breast: Secondary | ICD-10-CM

## 2018-01-19 DIAGNOSIS — Z1211 Encounter for screening for malignant neoplasm of colon: Secondary | ICD-10-CM

## 2018-01-19 MED ORDER — METFORMIN HCL 1000 MG PO TABS: 1000.0000 mg | ORAL_TABLET | Freq: Two times a day (BID) | ORAL | 1 refills | Status: DC

## 2018-01-19 MED ORDER — GABAPENTIN 300 MG PO CAPS: 300.0000 mg | ORAL_CAPSULE | Freq: Three times a day (TID) | ORAL | 1 refills | Status: DC

## 2018-01-19 MED ORDER — INSULIN GLARGINE 100 UNIT/ML SOLOSTAR PEN: 10.0000 [IU] | PEN_INJECTOR | Freq: Every day | SUBCUTANEOUS | 99 refills | Status: DC

## 2018-01-19 MED ORDER — OMEPRAZOLE 20 MG PO CPDR: 20.0000 mg | DELAYED_RELEASE_CAPSULE | Freq: Every day | ORAL | 1 refills | Status: DC

## 2018-01-19 NOTE — Patient Instructions (Signed)
Insulin Injection Instructions, Using Insulin Pens, Adult A subcutaneous injection is a shot of medicine that is injected into the layer of fat between skin and muscle. People with type 1 diabetes must take insulin because their bodies do not make it. People with type 2 diabetes may need to take insulin. There are many different types of insulin. The type of insulin that you take may determine how many injections you give yourself and when you need to take the injections. Choosing a site for injection Insulin absorption varies from site to site. It is best to inject insulin within the same body area, using a different spot in that area for each injection. Do not inject the insulin in the same spot for each injection. There are five main areas that can be used for injecting. These areas include:  Abdomen. This is the preferred area.  Front of thigh.  Upper, outer side of thigh.  Back of upper arm.  Buttocks.  Using an insulin pen First, follow the steps for Getting Ready, then continue with the steps for Injecting the Insulin. Getting Ready 1. Wash your hands with soap and water. If soap and water are not available, use hand sanitizer. 2. Check the expiration date and type of insulin in the pen. 3. If you are using CLEAR insulin, check to see that it is clear and free of clumps. 4. If you are using CLOUDY insulin, gently roll the pen between your palms several times, or tip the pen up and down several times to mix up the medicine. Do not shake the pen. 5. Remove the cap from the insulin pen. 6. Use an alcohol wipe to clean the rubber stopper of the pen cartridge. 7. Remove the protective paper tab from the disposable needle. Do not let the needle touch anything. 8. Screw the needle onto the pen. 9. Remove the outer and inner plastic covers from the needle. Do not throw away the outer plastic cover yet. 10. Prime the insulin pen by turning the button (dial) to 2 units. Hold the pen with the  needle pointing up, and push the button on the opposite end of the pen until a drop of insulin appears at the needle tip. If no insulin appears, repeat this step. 11. Dial the number of units of insulin that you will be injecting. Injecting the Insulin  1. Use an alcohol wipe to clean the site where you will be injecting the needle. Let the site air-dry. 2. Hold the pen in the palm of your writing hand with your thumb on the top. 3. If directed by your health care provider, use your other hand to pinch and hold about an inch of skin at the injection site. Do not directly touch the cleaned part of the skin. 4. Gently but quickly, put the needle straight into the skin. The needle should be at a 90-degree angle (perpendicular) to the skin, as if to form the letter "L." ? For example, if you are giving an injection in the abdomen, the abdomen forms one "leg" of the "L" and the needle forms the other "leg" of the "L." 5. For adults who have a small amount of body fat, the needle may need to be injected at a 45-degree angle instead. Your health care provider will tell you if this is necessary. ? A 45-degree angle looks like the letter "V." 6. When the needle is completely inserted into the skin, use the thumb of your writing hand to push the top   button of the pen down all the way to inject the insulin. 7. Let go of the skin that you are pinching. Continue to hold the pen in place with your writing hand. 8. Wait five seconds, then pull the needle straight out of the skin. 9. Carefully put the larger (outer) plastic cover of the needle back over the needle, then unscrew the capped needle and discard it in a sharps container, such as an empty plastic bottle with a cover. 10. Put the plastic cap back on the insulin pen. Throwing away supplies  Discard all used needles in a puncture-proof sharps disposal container. You can ask your local pharmacy about where you can get this kind of disposal container, or you  can use an empty liquid laundry detergent bottle that has a cover.  Follow the disposal regulations for the area where you live. Do not use any needle more than one time.  Throw away empty disposable pens in the regular trash. What questions should I ask my health care provider?  How often should I be taking insulin?  How often should I check my blood glucose?  What amount of insulin should I be taking at each time?  What are the side effects?  What should I do if my blood glucose is too high?  What should I do if my blood glucose is too low?  What should I do if I forget to take my insulin?  What number should I call if I have questions? Where can I get more information?  American Diabetes Association (ADA): www.diabetes.org  American Association of Diabetes Educators (AADE) Patient Resources: https://www.diabeteseducator.org/patient-resources This information is not intended to replace advice given to you by your health care provider. Make sure you discuss any questions you have with your health care provider. Document Released: 03/02/2015 Document Revised: 07/05/2015 Document Reviewed: 03/02/2015 Elsevier Interactive Patient Education  2018 [employer]  

## 2018-01-19 NOTE — Progress Notes (Signed)
 BP (!) 144/91 (BP Location: Right Arm, Patient Position: Sitting, Cuff Size: Normal)   Pulse 93   Temp 98.1 F (36.7 C)   Ht 5' 6.75" (1.695 m)   Wt 186 lb (84.4 kg)   SpO2 99%   BMI 29.35 kg/m    Subjective:    Patient ID: Julie Lambert, female    DOB: 12/29/1965, 52 y.o.   MRN: [id number]  HPI: Julie Lambert is a 52 y.o. female presenting on 01/19/2018 for New Patient (Initial Visit)   HPI   Pt with multiple trips to the ER this year.   Per recent admission (discharge note 01/11/18) , pt was d/c from psychiatric ward with plans to go to alcohol and drug treatment center but she felt uncomfortable at the center and the food wasn't good the the people weren't nice so she impulsively left.  Upon leaving, she returned to crack use.  Pt was also having SI.     Pt has recently moved into [facility] house for treatment of addiction.  She says it has been challenging getting adjusted to living in the group home but she is doing okay.    a1c was 12.5 during admission 3 wk ago.  Pt diagnosed with DM age 50.    She has appt next week with [facility] for MH services  Pt says she is taking the gabapentin 300mg tid for neuropathy.   She says she has Never had a mammogram  Pt has bs log with high 251 and low 66.    Relevant past medical, surgical, family and social history reviewed and updated as indicated. Interim medical history since our last visit reviewed. Allergies and medications reviewed and updated.   Current Outpatient Medications:  .  blood glucose meter kit and supplies, Dispense based on patient and insurance preference. Use up to four times daily as directed. (FOR ICD-10 E10.9, E11.9)., Disp: 1 each, Rfl: 0 .  citalopram (CELEXA) 20 MG tablet, Take 1 tablet (20 mg total) by mouth daily., Disp: 30 tablet, Rfl: 1 .  diclofenac (VOLTAREN) 50 MG EC tablet, Take 1 tablet (50 mg total) by mouth 2 (two) times daily., Disp: 60 tablet, Rfl: 1 .  gabapentin  (NEURONTIN) 600 MG tablet, Take 0.5 tablets (300 mg total) by mouth 3 (three) times daily., Disp: 45 tablet, Rfl: 1 .  glipiZIDE (GLUCOTROL) 5 MG tablet, Take 0.5 tablets (2.5 mg total) by mouth 2 (two) times daily before a meal., Disp: 30 tablet, Rfl: 1 .  hydrOXYzine (ATARAX/VISTARIL) 25 MG tablet, Take 1 tablet (25 mg total) by mouth 3 (three) times daily as needed for anxiety., Disp: 90 tablet, Rfl: 1 .  insulin aspart protamine- aspart (NOVOLOG MIX 70/30) (70-30) 100 UNIT/ML injection, Inject 0.1 mLs (10 Units total) into the skin 2 (two) times daily with a meal., Disp: 10 mL, Rfl: 1 .  metFORMIN (GLUCOPHAGE) 1000 MG tablet, Take 1 tablet (1,000 mg total) by mouth 2 (two) times daily with a meal., Disp: 60 tablet, Rfl: 1 .  QUEtiapine (SEROQUEL) 50 MG tablet, Take 50 mg by mouth at bedtime., Disp: , Rfl:  .  traZODone (DESYREL) 150 MG tablet, Take 1 tablet (150 mg total) by mouth at bedtime as needed for sleep., Disp: 30 tablet, Rfl: 1  Review of Systems  Constitutional: Positive for appetite change and unexpected weight change. Negative for chills, diaphoresis, fatigue and fever.  HENT: Positive for dental problem, ear pain, facial swelling, hearing loss and sore throat.   Negative for congestion, drooling, mouth sores, sneezing, trouble swallowing and voice change.   Eyes: Positive for visual disturbance. Negative for pain, discharge, redness and itching.  Respiratory: Positive for cough and shortness of breath. Negative for choking and wheezing.   Cardiovascular: Positive for leg swelling. Negative for chest pain and palpitations.  Gastrointestinal: Positive for abdominal pain and constipation. Negative for blood in stool, diarrhea and vomiting.  Endocrine: Negative for cold intolerance, heat intolerance and polydipsia.  Genitourinary: Negative for decreased urine volume, dysuria and hematuria.  Musculoskeletal: Negative for arthralgias, back pain and gait problem.  Skin: Negative for rash.   Allergic/Immunologic: Negative for environmental allergies.  Neurological: Negative for seizures, syncope, light-headedness and headaches.  Hematological: Negative for adenopathy.  Psychiatric/Behavioral: Negative for agitation, dysphoric mood and suicidal ideas. The patient is not nervous/anxious.     Per HPI unless specifically indicated above     Objective:    BP (!) 144/91 (BP Location: Right Arm, Patient Position: Sitting, Cuff Size: Normal)   Pulse 93   Temp 98.1 F (36.7 C)   Ht 5' 6.75" (1.695 m)   Wt 186 lb (84.4 kg)   SpO2 99%   BMI 29.35 kg/m   Wt Readings from Last 3 Encounters:  01/19/18 186 lb (84.4 kg)  01/13/18 178 lb 9.6 oz (81 kg)  01/03/18 156 lb 1.4 oz (70.8 kg)    Physical Exam  Constitutional: She is oriented to person, place, and time. She appears well-developed and well-nourished.  HENT:  Head: Normocephalic and atraumatic.  Mouth/Throat: Oropharynx is clear and moist. No oropharyngeal exudate.  Eyes: Pupils are equal, round, and reactive to light. Conjunctivae and EOM are normal.  Neck: Neck supple. No thyromegaly present.  Cardiovascular: Normal rate and regular rhythm.  Pulmonary/Chest: Effort normal and breath sounds normal.  Abdominal: Soft. Bowel sounds are normal. She exhibits no mass. There is no hepatosplenomegaly. There is no tenderness.  Musculoskeletal: She exhibits no edema.  Lymphadenopathy:    She has no cervical adenopathy.  Neurological: She is alert and oriented to person, place, and time. Gait normal.  Skin: Skin is warm and dry.  Psychiatric: She has a normal mood and affect. Her behavior is normal.  Vitals reviewed.       Assessment & Plan:   Encounter Diagnoses  Name Primary?  . Encounter to establish care Yes  . Uncontrolled type 2 diabetes mellitus with hyperglycemia (HCC)   . Type 2 diabetes mellitus with diabetic neuropathy, unspecified whether long term insulin use (HCC)   . Screening for breast cancer   .  Screening for colon cancer   . Tinea pedis of both feet   . Gastroesophageal reflux disease, esophagitis presence not specified   . Elevated blood pressure reading   . Mood disorder (HCC)   . Tobacco use disorder   . Cocaine abuse in remission (HCC)   . Alcohol abuse, in remission      -recent labs reviewed.  Only labs needed is microalbumin.  Will update that with next labs -will start lantus 10 u qhs and discontinue novolog 70/30 and glipizide.  Will Continue metformin.    Pt to follow diabetic diet.  Pt to monitor blood sugars.  She is to call office for fbs < 70 or > 300.   -ordered screening Mammogram -pt was given ifobt for colon cancer screening -will plan to update Pap soon (next several months) -pt is signed up for [facility] -counseled pt to us OTC Antifungal to feet -Add omeprazol   per pt req -Will monitor the bp- no rx today (it has been running good lately) -lipids mildly elevated.  Will consider addition statin at next OV -pt to follow up with bs log 4 weeks.  RTO sooner prn 

## 2018-01-25 ENCOUNTER — Telehealth: Payer: Self-pay

## 2018-01-25 NOTE — Telephone Encounter (Signed)
Attempted to call to follow up with client after [facility] appointment. No answer. Will attempt at a later time.    Debria Garret RN

## 2018-01-26 ENCOUNTER — Telehealth (HOSPITAL_COMMUNITY): Payer: Self-pay | Admitting: Obstetrics and Gynecology

## 2018-01-26 NOTE — Telephone Encounter (Signed)
Called patient regarding scheduling screening mammogram.  Not able to leave message.

## 2018-01-28 ENCOUNTER — Emergency Department (HOSPITAL_COMMUNITY): Payer: Self-pay

## 2018-01-28 ENCOUNTER — Emergency Department (HOSPITAL_COMMUNITY)
Admission: EM | Admit: 2018-01-28 | Discharge: 2018-01-28 | Disposition: A | Discharge status: Home / Self Care | Payer: Self-pay | Attending: Emergency Medicine | Admitting: Emergency Medicine

## 2018-01-28 ENCOUNTER — Other Ambulatory Visit: Payer: Self-pay

## 2018-01-28 ENCOUNTER — Encounter (HOSPITAL_COMMUNITY): Payer: Self-pay | Admitting: Emergency Medicine

## 2018-01-28 DIAGNOSIS — E119 Type 2 diabetes mellitus without complications: Secondary | ICD-10-CM | POA: Insufficient documentation

## 2018-01-28 DIAGNOSIS — F17210 Nicotine dependence, cigarettes, uncomplicated: Secondary | ICD-10-CM | POA: Insufficient documentation

## 2018-01-28 DIAGNOSIS — Z794 Long term (current) use of insulin: Secondary | ICD-10-CM | POA: Insufficient documentation

## 2018-01-28 DIAGNOSIS — R0789 Other chest pain: Secondary | ICD-10-CM | POA: Insufficient documentation

## 2018-01-28 DIAGNOSIS — M79602 Pain in left arm: Secondary | ICD-10-CM | POA: Insufficient documentation

## 2018-01-28 DIAGNOSIS — Z79899 Other long term (current) drug therapy: Secondary | ICD-10-CM | POA: Insufficient documentation

## 2018-01-28 DIAGNOSIS — E041 Nontoxic single thyroid nodule: Secondary | ICD-10-CM | POA: Insufficient documentation

## 2018-01-28 LAB — BASIC METABOLIC PANEL
Anion gap: 8 (ref 5–15)
BUN: 17 mg/dL (ref 6–20)
CO2: 21 mmol/L — ABNORMAL LOW (ref 22–32)
Calcium: 8.8 mg/dL — ABNORMAL LOW (ref 8.9–10.3)
Chloride: 109 mmol/L (ref 98–111)
Creatinine, Ser: 0.53 mg/dL (ref 0.44–1.00)
GFR calc Af Amer: >60 mL/min (ref >60)
GFR calc non Af Amer: >60 mL/min (ref >60)
Glucose, Bld: 167 mg/dL — ABNORMAL HIGH (ref 70–99)
Potassium: 3.9 mmol/L (ref 3.5–5.1)
Sodium: 138 mmol/L (ref 135–145)

## 2018-01-28 LAB — CBC
HCT: 37.5 % (ref 36.0–46.0)
HEMOGLOBIN: 12.3 g/dL (ref 12.0–15.0)
MCH: 29.9 pg (ref 26.0–34.0)
MCHC: 32.8 g/dL (ref 30.0–36.0)
MCV: 91 fL (ref 80.0–100.0)
Platelets: 279 10*3/uL (ref 150–400)
RBC: 4.12 MIL/uL (ref 3.87–5.11)
RDW: 12.1 % (ref 11.5–15.5)
WBC: 9.8 10*3/uL (ref 4.0–10.5)
nRBC: 0 % (ref 0.0–0.2)

## 2018-01-28 LAB — HCG, QUANTITATIVE, PREGNANCY: hCG, Beta Chain, Quant, S: 1 m[IU]/mL (ref <5)

## 2018-01-28 LAB — TROPONIN I
Troponin I: <0.03 ng/mL (ref <0.03)
Troponin I: <0.03 ng/mL (ref <0.03)

## 2018-01-28 LAB — D-DIMER, QUANTITATIVE: D-Dimer, Quant: 0.32 ug/mL-FEU (ref 0.00–0.50)

## 2018-01-28 MED ORDER — HYDROXYZINE HCL 25 MG PO TABS
25.0000 mg | ORAL_TABLET | Freq: Once | ORAL | Status: AC
  Administered 2018-01-28: 25 mg via ORAL
  Filled 2018-01-28: qty 1

## 2018-01-28 MED ORDER — METHOCARBAMOL 500 MG PO TABS
1000.0000 mg | ORAL_TABLET | Freq: Once | ORAL | Status: AC
  Administered 2018-01-28: 1000 mg via ORAL
  Filled 2018-01-28: qty 2

## 2018-01-28 MED ORDER — METHOCARBAMOL 500 MG PO TABS: 1000.0000 mg | ORAL_TABLET | Freq: Four times a day (QID) | ORAL | 0 refills | Status: DC | PRN

## 2018-01-28 MED ORDER — ACETAMINOPHEN 325 MG PO TABS
650.0000 mg | ORAL_TABLET | Freq: Once | ORAL | Status: AC
  Administered 2018-01-28: 650 mg via ORAL
  Filled 2018-01-28: qty 2

## 2018-01-28 MED ORDER — IBUPROFEN 400 MG PO TABS
400.0000 mg | ORAL_TABLET | Freq: Once | ORAL | Status: AC
  Administered 2018-01-28: 400 mg via ORAL
  Filled 2018-01-28: qty 1

## 2018-01-28 MED ORDER — GABAPENTIN 300 MG PO CAPS
300.0000 mg | ORAL_CAPSULE | Freq: Once | ORAL | Status: AC
  Administered 2018-01-28: 300 mg via ORAL
  Filled 2018-01-28: qty 1

## 2018-01-28 MED ORDER — NAPROXEN 250 MG PO TABS: 250.0000 mg | ORAL_TABLET | Freq: Two times a day (BID) | ORAL | 0 refills | Status: DC | PRN

## 2018-01-28 NOTE — ED Provider Notes (Signed)
[facility] EMERGENCY DEPARTMENT Provider Note   CSN: [id number] Arrival date & time: 01/28/18  1807     History   Chief Complaint Chief Complaint  Patient presents with  . Chest Pain    HPI Julie Lambert is a 52 y.o. female.  HPI  Pt was seen at [facility]. Per pt, c/o gradual onset and persistence of constant left sided chest "pains" that began while cooking at 1630 this afternoon. Pt describes the CP as "sharp," worsens with palpation of the area. CP has been constant since onset. Pt also c/o generalized left arm "pains" for the past 1 week, associated with left sided neck "pains." Pt endorses hx of DDD. Pt states she is currently at a detox/rehab facility and has been taking tylenol and motrin without improvement. Denies focal motor weakness, no tingling/numbness in extremities, no ataxia, no slurred speech, no facial droop, no palpitations, no SOB/cough, no abd pain, no N/V/D, no rash, no fevers, no injury.      Past Medical History:  Diagnosis Date  . Bipolar 2 disorder ([place])   . Diabetes mellitus without complication ([place])   . Diverticulitis   . Diverticulosis   . GERD (gastroesophageal reflux disease)   . Sciatica   . Uterine fibroid     Patient Active Problem List   Diagnosis Date Noted  . Tobacco use disorder 01/04/2018  . Suicidal ideation 01/04/2018  . Bipolar I disorder, most recent episode depressed, severe without psychotic features ([place]) 01/02/2018  . Bipolar 2 disorder ([place]) 12/25/2017  . Diabetes mellitus without complication ([place]) 40/98/1191  . Severe alcohol use disorder ([place]) 12/25/2017  . Cocaine use disorder, moderate, dependence ([place]) 12/25/2017    Past Surgical History:  Procedure Laterality Date  . BACK SURGERY    . CHOLECYSTECTOMY    . LUMBAR SPINE SURGERY       OB History   No obstetric history on file.      Home Medications    Prior to Admission medications   Medication Sig Start Date End Date Taking? Authorizing Provider    citalopram (CELEXA) 20 MG tablet Take 1 tablet (20 mg total) by mouth daily. 01/11/18   [name], Herma Ard B, MD  diclofenac (VOLTAREN) 50 MG EC tablet Take 1 tablet (50 mg total) by mouth 2 (two) times daily. 01/06/18   [name], Herma Ard B, MD  gabapentin (NEURONTIN) 300 MG capsule Take 1 capsule (300 mg total) by mouth 3 (three) times daily. 01/19/18   Soyla Dryer, PA-C  hydrOXYzine (ATARAX/VISTARIL) 25 MG tablet Take 1 tablet (25 mg total) by mouth 3 (three) times daily as needed for anxiety. 01/06/18   [name], Herma Ard B, MD  Insulin Glargine (LANTUS SOLOSTAR) 100 UNIT/ML Solostar Pen Inject 10 Units into the skin at bedtime. 01/19/18   Soyla Dryer, PA-C  metFORMIN (GLUCOPHAGE) 1000 MG tablet Take 1 tablet (1,000 mg total) by mouth 2 (two) times daily with a meal. 01/19/18   Soyla Dryer, PA-C  omeprazole (PRILOSEC) 20 MG capsule Take 1 capsule (20 mg total) by mouth daily. 01/19/18   Soyla Dryer, PA-C  QUEtiapine (SEROQUEL) 50 MG tablet Take 50 mg by mouth at bedtime.    [provider]  traZODone (DESYREL) 150 MG tablet Take 1 tablet (150 mg total) by mouth at bedtime as needed for sleep. 01/11/18   Clovis Fredrickson, MD    Family History Family History  Problem Relation Age of Onset  . CAD Mother   . Diabetes Mother   . Heart disease  Mother   . Parkinson's disease Mother   . Kidney disease Mother   . Cancer Father        prostate cancer    Social History Social History   Tobacco Use  . Smoking status: Current Every Day Smoker    Packs/day: 0.25    Years: 40.00    Pack years: 10.00    Types: Cigarettes  . Smokeless tobacco: Never Used  Substance Use Topics  . Alcohol use: Not Currently    Comment: none since 12-25-2017. previous heavy drinker  . Drug use: Not Currently    Types: "Crack" cocaine    Comment: LAST USED NOV. 15, 2019     Allergies   Iodinated diagnostic agents   Review of Systems Review of Systems ROS: Statement:  All systems negative except as marked or noted in the HPI; Constitutional: Negative for fever and chills. ; ; Eyes: Negative for eye pain, redness and discharge. ; ; ENMT: Negative for ear pain, hoarseness, nasal congestion, sinus pressure and sore throat. ; ; Cardiovascular: Negative for palpitations, diaphoresis, dyspnea and peripheral edema. ; ; Respiratory: Negative for cough, wheezing and stridor. ; ; Gastrointestinal: Negative for nausea, vomiting, diarrhea, abdominal pain, blood in stool, hematemesis, jaundice and rectal bleeding. . ; ; Genitourinary: Negative for dysuria, flank pain and hematuria. ; ; Musculoskeletal: +chest pain, neck/arm pain. Negative for back pain. Negative for swelling and trauma.; ; Skin: Negative for pruritus, rash, abrasions, blisters, bruising and skin lesion.; ; Neuro: Negative for headache, lightheadedness and neck stiffness. Negative for weakness, altered level of consciousness, altered mental status, extremity weakness, paresthesias, involuntary movement, seizure and syncope.       Physical Exam Updated Vital Signs BP (!) 154/100   Resp 13   Physical Exam 1845: Physical examination:  Nursing notes reviewed; Vital signs and O2 SAT reviewed;  Constitutional: Well developed, Well nourished, Well hydrated, Crying and moaning; Head:  Normocephalic, atraumatic; Eyes: EOMI, PERRL, No scleral icterus; ENMT: Mouth and pharynx normal, Mucous membranes moist; Neck: Supple, Full range of motion, No lymphadenopathy; Cardiovascular: Regular rate and rhythm, No gallop; Respiratory: Breath sounds clear & equal bilaterally, No wheezes.  Speaking full sentences with ease, Normal respiratory effort/excursion; Chest: +left anterior-lateral chest wall tender to palp which reproduces pt's symptoms. No soft tissue crepitus, no deformity. Movement normal; Abdomen: Soft, Nontender, Nondistended, Normal bowel sounds; Genitourinary: No CVA tenderness; Spine:  No midline CS, TS, LS tenderness.  +TTP left hypertonic trapezius muscle. No rash.;; Extremities: Peripheral pulses normal, NT left shoulder/elbow/wrist/hand, NMS intact with strong radial pulse. No tenderness, No edema, No calf edema or asymmetry.; Neuro: AA&Ox3, Major CN grossly intact.  Speech clear. No gross focal motor or sensory deficits in extremities.; Skin: Color normal, Warm, Dry.   ED Treatments / Results  Labs (all labs ordered are listed, but only abnormal results are displayed)   EKG EKG Interpretation  Date/Time:  Thursday January 28 2018 18:22:36 EST Ventricular Rate:  98 PR Interval:  138 QRS Duration: 84 QT Interval:  368 QTC Calculation: 469 R Axis:   -52 Text Interpretation:  Normal sinus rhythm Left axis deviation Possible Left atrial enlargement Left anterior fascicular block Possible Anterior infarct , age undetermined When compared with ECG of 11/25/2017 and 05/28/2017 No significant change was found Confirmed by Francine Graven [phone]) on 01/28/2018 6:46:06 PM   Radiology   Procedures Procedures (including critical care time)  Medications Ordered in ED Medications  acetaminophen (TYLENOL) tablet 650 mg (650 mg Oral Given 01/28/18 1902)  ibuprofen (ADVIL,MOTRIN) tablet 400 mg (400 mg Oral Given 01/28/18 1902)  methocarbamol (ROBAXIN) tablet 1,000 mg (1,000 mg Oral Given 01/28/18 1902)  gabapentin (NEURONTIN) capsule 300 mg (300 mg Oral Given 01/28/18 2028)  hydrOXYzine (ATARAX/VISTARIL) tablet 25 mg (25 mg Oral Given 01/28/18 2029)     Initial Impression / Assessment and Plan / ED Course  I have reviewed the triage vital signs and the nursing notes.  Pertinent labs & imaging results that were available during my care of the patient were reviewed by me and considered in my medical decision making (see chart for details).  MDM Reviewed: previous chart, nursing note and vitals Reviewed previous: labs and ECG Interpretation: labs, ECG, x-ray and CT scan   Results for orders placed  or performed during the hospital encounter of 63/01/60  Basic metabolic panel  Result Value Ref Range   Sodium 138 135 - 145 mmol/L   Potassium 3.9 3.5 - 5.1 mmol/L   Chloride 109 98 - 111 mmol/L   CO2 21 (L) 22 - 32 mmol/L   Glucose, Bld 167 (H) 70 - 99 mg/dL   BUN 17 6 - 20 mg/dL   Creatinine, Ser 0.53 0.44 - 1.00 mg/dL   Calcium 8.8 (L) 8.9 - 10.3 mg/dL   GFR calc non Af Amer >60 >60 mL/min   GFR calc Af Amer >60 >60 mL/min   Anion gap 8 5 - 15  CBC  Result Value Ref Range   WBC 9.8 4.0 - 10.5 K/uL   RBC 4.12 3.87 - 5.11 MIL/uL   Hemoglobin 12.3 12.0 - 15.0 g/dL   HCT 37.5 36.0 - 46.0 %   MCV 91.0 80.0 - 100.0 fL   MCH 29.9 26.0 - 34.0 pg   MCHC 32.8 30.0 - 36.0 g/dL   RDW 12.1 11.5 - 15.5 %   Platelets 279 150 - 400 K/uL   nRBC 0.0 0.0 - 0.2 %  Troponin I - ONCE - STAT  Result Value Ref Range   Troponin I <0.03 <0.03 ng/mL  hCG, quantitative, pregnancy  Result Value Ref Range   hCG, Beta Chain, Quant, S 1 <5 mIU/mL  D-dimer, quantitative  Result Value Ref Range   D-Dimer, Quant 0.32 0.00 - 0.50 ug/mL-FEU  Troponin I - Once-Timed  Result Value Ref Range   Troponin I <0.03 <0.03 ng/mL   Dg Chest 2 View Result Date: 01/28/2018 CLINICAL DATA:  Chest and back pain radiating to the left arm today. EXAM: CHEST - 2 VIEW COMPARISON:  None. FINDINGS: Poor inspiration. Grossly normal sized heart. Possible small hiatal hernia. Right diaphragmatic eventration. IMPRESSION: No acute findings. Possible small hiatal hernia. Electronically Signed   By: Claudie Revering M.D.   On: 01/28/2018 19:49   Ct Cervical Spine Wo Contrast Result Date: 01/28/2018 CLINICAL DATA:  Patient with left arm pain.  No known injury. EXAM: CT CERVICAL SPINE WITHOUT CONTRAST TECHNIQUE: Multidetector CT imaging of the cervical spine was performed without intravenous contrast. Multiplanar CT image reconstructions were also generated. COMPARISON:  None. FINDINGS: Alignment: Straightening of the normal cervical  lordosis. Skull base and vertebrae: No acute fracture. No primary bone lesion or focal pathologic process. Soft tissues and spinal canal: No prevertebral fluid or swelling. No visible canal hematoma. Disc levels:  Mild degenerative disc disease C4-5 and C5-6. Upper chest: Unremarkable Other: There is a 1.9 cm nodule within the right thyroid lobe. IMPRESSION: No acute process involving the cervical spine. Nodule within the right lobe of the thyroid. If not  previously performed, recommend further evaluation with thyroid ultrasound in the non acute setting. Electronically Signed   By: Lovey Newcomer M.D.   On: 01/28/2018 20:41    2235:  Workup reassuring. Doubt PE as cause for symptoms with normal d-dimer and low risk [place].  Doubt ACS as cause for symptoms with normal troponin x2 and unchanged EKG from previous after 6 hours of constant atypical symptoms. Tx symptomatically, f/u PMD. Dx and testing d/w pt.  Questions answered.  Verb understanding, agreeable to d/c home with outpt f/u.      Final Clinical Impressions(s) / ED Diagnoses   Final diagnoses:  None    ED Discharge Orders    None       Francine Graven, DO 02/01/18 8301

## 2018-01-28 NOTE — ED Notes (Signed)
EKG handed off to Dr. Thurnell Garbe

## 2018-01-28 NOTE — Discharge Instructions (Addendum)
Your CT scan showed an incidental finding: "Nodule within the right lobe of the thyroid. If not previously performed, recommend further evaluation with thyroid ultrasound in the non acute setting."  Your regular medical doctor can follow up this finding. Take the prescriptions as directed.  Apply moist heat or ice to the area(s) of discomfort, for 15 minutes at a time, several times per day for the next few days.  Do not fall asleep on a heating or ice pack.  Call your regular medical doctor tomorrow to schedule a follow up appointment in the next 2 days.  Return to the Emergency Department immediately if worsening.

## 2018-01-28 NOTE — ED Triage Notes (Signed)
Patient complains of chest pain that radiates to back and left arm that started today. Patient also states nausea.   States left arm has been hurting for about a week.

## 2018-01-28 NOTE — ED Notes (Signed)
Pt to ct 

## 2018-01-31 ENCOUNTER — Emergency Department (HOSPITAL_COMMUNITY)
Admission: EM | Admit: 2018-01-31 | Discharge: 2018-01-31 | Disposition: A | Discharge status: Home / Self Care | Payer: Self-pay | Attending: Emergency Medicine | Admitting: Emergency Medicine

## 2018-01-31 ENCOUNTER — Encounter (HOSPITAL_COMMUNITY): Payer: Self-pay | Admitting: Emergency Medicine

## 2018-01-31 ENCOUNTER — Other Ambulatory Visit: Payer: Self-pay

## 2018-01-31 ENCOUNTER — Emergency Department (HOSPITAL_COMMUNITY): Payer: Self-pay

## 2018-01-31 DIAGNOSIS — Z9049 Acquired absence of other specified parts of digestive tract: Secondary | ICD-10-CM | POA: Insufficient documentation

## 2018-01-31 DIAGNOSIS — S39012A Strain of muscle, fascia and tendon of lower back, initial encounter: Secondary | ICD-10-CM | POA: Insufficient documentation

## 2018-01-31 DIAGNOSIS — F319 Bipolar disorder, unspecified: Secondary | ICD-10-CM | POA: Insufficient documentation

## 2018-01-31 DIAGNOSIS — Y929 Unspecified place or not applicable: Secondary | ICD-10-CM | POA: Insufficient documentation

## 2018-01-31 DIAGNOSIS — F1020 Alcohol dependence, uncomplicated: Secondary | ICD-10-CM | POA: Insufficient documentation

## 2018-01-31 DIAGNOSIS — F17210 Nicotine dependence, cigarettes, uncomplicated: Secondary | ICD-10-CM | POA: Insufficient documentation

## 2018-01-31 DIAGNOSIS — E119 Type 2 diabetes mellitus without complications: Secondary | ICD-10-CM | POA: Insufficient documentation

## 2018-01-31 DIAGNOSIS — Z79899 Other long term (current) drug therapy: Secondary | ICD-10-CM | POA: Insufficient documentation

## 2018-01-31 DIAGNOSIS — Y999 Unspecified external cause status: Secondary | ICD-10-CM | POA: Insufficient documentation

## 2018-01-31 DIAGNOSIS — Y939 Activity, unspecified: Secondary | ICD-10-CM | POA: Insufficient documentation

## 2018-01-31 DIAGNOSIS — F1420 Cocaine dependence, uncomplicated: Secondary | ICD-10-CM | POA: Insufficient documentation

## 2018-01-31 DIAGNOSIS — Z794 Long term (current) use of insulin: Secondary | ICD-10-CM | POA: Insufficient documentation

## 2018-01-31 DIAGNOSIS — X58XXXA Exposure to other specified factors, initial encounter: Secondary | ICD-10-CM | POA: Insufficient documentation

## 2018-01-31 HISTORY — DX: Other psychoactive substance abuse, uncomplicated: F19.10

## 2018-01-31 LAB — CBC
HCT: 39.1 % (ref 36.0–46.0)
Hemoglobin: 12.9 g/dL (ref 12.0–15.0)
MCH: 29.7 pg (ref 26.0–34.0)
MCHC: 33 g/dL (ref 30.0–36.0)
MCV: 89.9 fL (ref 80.0–100.0)
Platelets: 298 10*3/uL (ref 150–400)
RBC: 4.35 MIL/uL (ref 3.87–5.11)
RDW: 11.9 % (ref 11.5–15.5)
WBC: 10.1 10*3/uL (ref 4.0–10.5)
nRBC: 0 % (ref 0.0–0.2)

## 2018-01-31 LAB — BASIC METABOLIC PANEL
Anion gap: 7 (ref 5–15)
BUN: 14 mg/dL (ref 6–20)
CHLORIDE: 110 mmol/L (ref 98–111)
CO2: 23 mmol/L (ref 22–32)
CREATININE: 0.62 mg/dL (ref 0.44–1.00)
Calcium: 9.1 mg/dL (ref 8.9–10.3)
GFR calc Af Amer: >60 mL/min (ref >60)
GFR calc non Af Amer: >60 mL/min (ref >60)
Glucose, Bld: 118 mg/dL — ABNORMAL HIGH (ref 70–99)
Potassium: 3.9 mmol/L (ref 3.5–5.1)
SODIUM: 140 mmol/L (ref 135–145)

## 2018-01-31 LAB — URINALYSIS, ROUTINE W REFLEX MICROSCOPIC
Bilirubin Urine: NEGATIVE
Glucose, UA: NEGATIVE mg/dL
Hgb urine dipstick: NEGATIVE
Ketones, ur: NEGATIVE mg/dL
Leukocytes, UA: NEGATIVE
Nitrite: NEGATIVE
Protein, ur: NEGATIVE mg/dL
Specific Gravity, Urine: 1.017 (ref 1.005–1.030)
pH: 5 (ref 5.0–8.0)

## 2018-01-31 LAB — CBG MONITORING, ED: Glucose-Capillary: 130 mg/dL — ABNORMAL HIGH (ref 70–99)

## 2018-01-31 MED ORDER — ONDANSETRON HCL 4 MG/2ML IJ SOLN
4.0000 mg | Freq: Once | INTRAMUSCULAR | Status: AC
  Administered 2018-01-31: 4 mg via INTRAVENOUS
  Filled 2018-01-31: qty 2

## 2018-01-31 MED ORDER — HYDROMORPHONE HCL 1 MG/ML IJ SOLN
0.5000 mg | Freq: Once | INTRAMUSCULAR | Status: DC
  Filled 2018-01-31: qty 1

## 2018-01-31 MED ORDER — IBUPROFEN 800 MG PO TABS: 800.0000 mg | ORAL_TABLET | Freq: Three times a day (TID) | ORAL | 0 refills | Status: DC | PRN

## 2018-01-31 MED ORDER — TRAMADOL HCL 50 MG PO TABS
50.0000 mg | ORAL_TABLET | Freq: Once | ORAL | Status: AC
  Administered 2018-01-31: 50 mg via ORAL
  Filled 2018-01-31: qty 1

## 2018-01-31 MED ORDER — SODIUM CHLORIDE 0.9 % IV BOLUS
1000.0000 mL | Freq: Once | INTRAVENOUS | Status: AC
  Administered 2018-01-31: 1000 mL via INTRAVENOUS

## 2018-01-31 NOTE — ED Triage Notes (Signed)
Pt here for lower back pain and possible blood in urine.

## 2018-01-31 NOTE — ED Provider Notes (Signed)
[facility] EMERGENCY DEPARTMENT Provider Note   CSN: [id number] Arrival date & time: 01/31/18  1409     History   Chief Complaint Chief Complaint  Patient presents with  . Back Pain    HPI Julie Lambert is a 52 y.o. female.  Patient complains of lower back pain.  Patient not having any pain on urination.  Patient also has some suprapubic discomfort  The history is provided by the patient. No language interpreter was used.  Back Pain   This is a new problem. The current episode started more than 2 days ago. The problem occurs constantly. The problem has not changed since onset.The pain is associated with no known injury. The pain is present in the lumbar spine. The quality of the pain is described as aching. The pain does not radiate. Pertinent negatives include no chest pain, no headaches and no abdominal pain.    Past Medical History:  Diagnosis Date  . Bipolar 2 disorder ([place])   . Diabetes mellitus without complication ([place])   . Diverticulitis   . Diverticulosis   . GERD (gastroesophageal reflux disease)   . Sciatica   . Substance abuse ([place])   . Uterine fibroid     Patient Active Problem List   Diagnosis Date Noted  . Tobacco use disorder 01/04/2018  . Suicidal ideation 01/04/2018  . Bipolar I disorder, most recent episode depressed, severe without psychotic features ([place]) 01/02/2018  . Bipolar 2 disorder ([place]) 12/25/2017  . Diabetes mellitus without complication ([place]) 40/98/1191  . Severe alcohol use disorder ([place]) 12/25/2017  . Cocaine use disorder, moderate, dependence ([place]) 12/25/2017    Past Surgical History:  Procedure Laterality Date  . BACK SURGERY    . CHOLECYSTECTOMY    . LUMBAR SPINE SURGERY       OB History   No obstetric history on file.      Home Medications    Prior to Admission medications   Medication Sig Start Date End Date Taking? Authorizing Provider  citalopram (CELEXA) 20 MG tablet Take 1 tablet (20 mg total) by mouth  daily. 01/11/18  Yes [name], [name], MD  diclofenac (VOLTAREN) 50 MG EC tablet Take 1 tablet (50 mg total) by mouth 2 (two) times daily. 01/06/18  Yes [name], [name], MD  gabapentin (NEURONTIN) 300 MG capsule Take 1 capsule (300 mg total) by mouth 3 (three) times daily. 01/19/18  Yes Soyla Dryer, PA-C  hydrOXYzine (ATARAX/VISTARIL) 25 MG tablet Take 1 tablet (25 mg total) by mouth 3 (three) times daily as needed for anxiety. 01/06/18  Yes [name], [name], MD  Insulin Glargine (LANTUS SOLOSTAR) 100 UNIT/ML Solostar Pen Inject 10 Units into the skin at bedtime. 01/19/18  Yes Soyla Dryer, PA-C  metFORMIN (GLUCOPHAGE) 1000 MG tablet Take 1 tablet (1,000 mg total) by mouth 2 (two) times daily with a meal. 01/19/18  Yes Soyla Dryer, PA-C  omeprazole (PRILOSEC) 20 MG capsule Take 1 capsule (20 mg total) by mouth daily. 01/19/18  Yes Soyla Dryer, PA-C  traZODone (DESYREL) 150 MG tablet Take 1 tablet (150 mg total) by mouth at bedtime as needed for sleep. 01/11/18  Yes [name], [name], MD  ibuprofen (ADVIL,MOTRIN) 800 MG tablet Take 1 tablet (800 mg total) by mouth every 8 (eight) hours as needed for moderate pain. 01/31/18   [name] Ferguson, MD  methocarbamol (ROBAXIN) 500 MG tablet Take 2 tablets (1,000 mg total) by mouth 4 (four) times daily as needed for muscle spasms (muscle spasm/pain). Patient not  taking: Reported on 01/31/2018 01/28/18   Francine Graven, DO  naproxen (NAPROSYN) 250 MG tablet Take 1 tablet (250 mg total) by mouth 2 (two) times daily as needed for mild pain or moderate pain (take with food). Patient not taking: Reported on 01/31/2018 01/28/18   Francine Graven, DO    Family History Family History  Problem Relation Age of Onset  . CAD Mother   . Diabetes Mother   . Heart disease Mother   . Parkinson's disease Mother   . Kidney disease Mother   . Cancer Father        prostate cancer    Social History Social History   Tobacco Use   . Smoking status: Current Every Day Smoker    Packs/day: 0.25    Years: 40.00    Pack years: 10.00    Types: Cigarettes  . Smokeless tobacco: Never Used  Substance Use Topics  . Alcohol use: Not Currently    Comment: none since 12-25-2017. previous heavy drinker  . Drug use: Not Currently    Types: "Crack" cocaine    Comment: LAST USED NOV. 15, 2019     Allergies   Ivp dye [iodinated diagnostic agents]   Review of Systems Review of Systems  Constitutional: Negative for appetite change and fatigue.  HENT: Negative for congestion, ear discharge and sinus pressure.   Eyes: Negative for discharge.  Respiratory: Negative for cough.   Cardiovascular: Negative for chest pain.  Gastrointestinal: Negative for abdominal pain and diarrhea.  Genitourinary: Negative for frequency and hematuria.  Musculoskeletal: Positive for back pain.  Skin: Negative for rash.  Neurological: Negative for seizures and headaches.  Psychiatric/Behavioral: Negative for hallucinations.     Physical Exam Updated Vital Signs BP (!) 135/96 (BP Location: Right Arm)   Pulse 95   Temp 98.4 F (36.9 C) (Oral)   Resp 18   Ht 5' 6.5" (1.689 m)   Wt 81.6 kg   SpO2 99%   BMI 28.62 kg/m   Physical Exam Constitutional:      Appearance: She is well-developed.  HENT:     Head: Normocephalic.     Nose: Nose normal.  Eyes:     General: No scleral icterus.    Conjunctiva/sclera: Conjunctivae normal.  Neck:     Musculoskeletal: Neck supple.     Thyroid: No thyromegaly.  Cardiovascular:     Rate and Rhythm: Normal rate and regular rhythm.     Heart sounds: No murmur. No friction rub. No gallop.   Pulmonary:     Breath sounds: No stridor. No wheezing or rales.  Chest:     Chest wall: No tenderness.  Abdominal:     General: There is no distension.     Tenderness: There is abdominal tenderness. There is no rebound.  Genitourinary:    Comments: Tender lower lumbar spine Musculoskeletal: Normal range  of motion.  Lymphadenopathy:     Cervical: No cervical adenopathy.  Skin:    Findings: No erythema or rash.  Neurological:     Mental Status: She is oriented to person, place, and time.     Motor: No abnormal muscle tone.     Coordination: Coordination normal.  Psychiatric:        Behavior: Behavior normal.      ED Treatments / Results  Labs (all labs ordered are listed, but only abnormal results are displayed) Labs Reviewed  URINALYSIS, ROUTINE W REFLEX MICROSCOPIC - Abnormal; Notable for the following components:      Result  Value   APPearance HAZY (*)    All other components within normal limits  BASIC METABOLIC PANEL - Abnormal; Notable for the following components:   Glucose, Bld 118 (*)    All other components within normal limits  CBG MONITORING, ED - Abnormal; Notable for the following components:   Glucose-Capillary 130 (*)    All other components within normal limits  CBC    EKG None  Radiology Ct Renal Stone Study  Result Date: 01/31/2018 CLINICAL DATA:  Right-sided flank and back pain. EXAM: CT ABDOMEN AND PELVIS WITHOUT CONTRAST TECHNIQUE: Multidetector CT imaging of the abdomen and pelvis was performed following the standard protocol without IV contrast. COMPARISON:  None. FINDINGS: Lower chest: No acute findings. Hepatobiliary: A low-attenuation mass is seen involving segment 2 of the left lobe which measures 7.3 x 5.5 cm. This can be characterized on this unenhanced exam. No other definite liver lesions are identified. Prior cholecystectomy. No evidence of biliary obstruction. Pancreas: No mass or inflammatory process visualized on this unenhanced exam. Spleen:  Within normal limits in size. Adrenals/Urinary tract: No evidence of urolithiasis or hydronephrosis. Unremarkable unopacified urinary bladder. Stomach/Bowel: No evidence of obstruction, inflammatory process, or abnormal fluid collections. Normal appendix visualized. Vascular/Lymphatic: No pathologically  enlarged lymph nodes identified. No evidence of abdominal aortic aneurysm. Aortic atherosclerosis. Reproductive: A 4.3 cm subserosal fibroid is seen arising from the left uterine fundus. A 3.0 cm benign-appearing cyst is also seen in the left adnexa. No evidence of inflammatory process or abnormal fluid collections. Other:  None. Musculoskeletal:  No suspicious bone lesions identified. IMPRESSION: No evidence of urolithiasis or hydronephrosis. 4.3 cm left fundal uterine fibroid. 3.0 cm benign-appearing left adnexal/ovarian cyst. 7.3 cm indeterminate low-attenuation mass in the left hepatic lobe, which cannot be characterized on this unenhanced exam. Recommend further characterization with abdomen MRI without and with contrast. Electronically Signed   By: Earle Gell M.D.   On: 01/31/2018 17:39    Procedures Procedures (including critical care time)  Medications Ordered in ED Medications  HYDROmorphone (DILAUDID) injection 0.5 mg (0.5 mg Intravenous Refused 01/31/18 1654)  sodium chloride 0.9 % bolus 1,000 mL (1,000 mLs Intravenous New Bag/Given 01/31/18 1637)  ondansetron (ZOFRAN) injection 4 mg (4 mg Intravenous Given 01/31/18 1644)  traMADol (ULTRAM) tablet 50 mg (50 mg Oral Given 01/31/18 1700)     Initial Impression / Assessment and Plan / ED Course  I have reviewed the triage vital signs and the nursing notes.  Pertinent labs & imaging results that were available during my care of the patient were reviewed by me and considered in my medical decision making (see chart for details).     Labs unremarkable.  CT scan shows fibroids and uterus and mass in her liver.  Patient is referred to GI and OB/GYN and is given Motrin for discomfort Final Clinical Impressions(s) / ED Diagnoses   Final diagnoses:  Strain of lumbar region, initial encounter    ED Discharge Orders         Ordered    ibuprofen (ADVIL,MOTRIN) 800 MG tablet  Every 8 hours PRN     01/31/18 1819           [name] Ferguson, MD 01/31/18 1826

## 2018-01-31 NOTE — Discharge Instructions (Signed)
Follow-up with Dr. Glo Herring for your fibroids.  Also follow-up with either Dr. Melony Overly or your family doctor to get the MRI of your liver

## 2018-02-08 ENCOUNTER — Ambulatory Visit: Payer: Self-pay | Admitting: Physician Assistant

## 2018-02-08 ENCOUNTER — Encounter: Payer: Self-pay | Admitting: Physician Assistant

## 2018-02-08 VITALS — BP 130/78 | HR 95 | Temp 97.7°F | Ht 66.75 in | Wt 183.5 lb

## 2018-02-08 DIAGNOSIS — E785 Hyperlipidemia, unspecified: Secondary | ICD-10-CM

## 2018-02-08 DIAGNOSIS — R160 Hepatomegaly, not elsewhere classified: Secondary | ICD-10-CM

## 2018-02-08 DIAGNOSIS — E1165 Type 2 diabetes mellitus with hyperglycemia: Secondary | ICD-10-CM

## 2018-02-08 DIAGNOSIS — K769 Liver disease, unspecified: Secondary | ICD-10-CM

## 2018-02-08 DIAGNOSIS — E041 Nontoxic single thyroid nodule: Secondary | ICD-10-CM

## 2018-02-08 MED ORDER — INSULIN GLARGINE 100 UNIT/ML SOLOSTAR PEN: 15.0000 [IU] | PEN_INJECTOR | Freq: Every day | SUBCUTANEOUS | 99 refills | Status: DC

## 2018-02-08 MED ORDER — GABAPENTIN 300 MG PO CAPS: 300.0000 mg | ORAL_CAPSULE | Freq: Three times a day (TID) | ORAL | 1 refills | Status: DC

## 2018-02-08 MED ORDER — ATORVASTATIN CALCIUM 10 MG PO TABS: 10.0000 mg | ORAL_TABLET | Freq: Every day | ORAL | 1 refills | Status: DC

## 2018-02-08 NOTE — Patient Instructions (Addendum)
Financial Counselor- [phone]   DO NOT TAKE IBUPROFEN WHILE TAKING DICLOFENAC

## 2018-02-08 NOTE — Progress Notes (Signed)
BP 130/78 (BP Location: Left Arm, Patient Position: Sitting, Cuff Size: Normal)   Pulse 95   Temp 97.7 F (36.5 C)   Ht 5' 6.75" (1.695 m)   Wt 183 lb 8 oz (83.2 kg)   SpO2 97%   BMI 28.96 kg/m    Subjective:    Patient ID: Julie Lambert, female    DOB: 1966-01-04, 52 y.o.   MRN: [id number]  HPI: Julie Lambert is a 52 y.o. female presenting on 02/08/2018 for Follow-up   HPI   Pt is in for follow up on new pt appointment several weeks ago and to follow up on recent visit to ER which found several issues on imaging studies.  Pt is pretty feeling well today other than her back pain.  She says she is settling in to things at [place] and is working hard on sobriety.   She is going to [facility] care.     Reviewed bs log which pt brings in.  She is currently using 10 units of lantus.  Am fasting bs running 143-255.  Reviewed imaging studies done in the ER with pt   Relevant past medical, surgical, family and social history reviewed and updated as indicated. Interim medical history since our last visit reviewed. Allergies and medications reviewed and updated.   Current Outpatient Medications:  .  citalopram (CELEXA) 20 MG tablet, Take 1 tablet (20 mg total) by mouth daily., Disp: 30 tablet, Rfl: 1 .  diclofenac (VOLTAREN) 50 MG EC tablet, Take 1 tablet (50 mg total) by mouth 2 (two) times daily., Disp: 60 tablet, Rfl: 1 .  gabapentin (NEURONTIN) 300 MG capsule, Take 1 capsule (300 mg total) by mouth 3 (three) times daily., Disp: 270 capsule, Rfl: 1 .  hydrOXYzine (ATARAX/VISTARIL) 25 MG tablet, Take 1 tablet (25 mg total) by mouth 3 (three) times daily as needed for anxiety., Disp: 90 tablet, Rfl: 1 .  ibuprofen (ADVIL,MOTRIN) 800 MG tablet, Take 1 tablet (800 mg total) by mouth every 8 (eight) hours as needed for moderate pain., Disp: 30 tablet, Rfl: 0 .  Insulin Glargine (LANTUS SOLOSTAR) 100 UNIT/ML Solostar Pen, Inject 10 Units into the skin at bedtime., Disp:  5 pen, Rfl: PRN .  metFORMIN (GLUCOPHAGE) 1000 MG tablet, Take 1 tablet (1,000 mg total) by mouth 2 (two) times daily with a meal., Disp: 180 tablet, Rfl: 1 .  omeprazole (PRILOSEC) 20 MG capsule, Take 1 capsule (20 mg total) by mouth daily., Disp: 90 capsule, Rfl: 1 .  traZODone (DESYREL) 150 MG tablet, Take 1 tablet (150 mg total) by mouth at bedtime as needed for sleep., Disp: 30 tablet, Rfl: 1 .  methocarbamol (ROBAXIN) 500 MG tablet, Take 2 tablets (1,000 mg total) by mouth 4 (four) times daily as needed for muscle spasms (muscle spasm/pain). (Patient not taking: Reported on 01/31/2018), Disp: 25 tablet, Rfl: 0 .  naproxen (NAPROSYN) 250 MG tablet, Take 1 tablet (250 mg total) by mouth 2 (two) times daily as needed for mild pain or moderate pain (take with food). (Patient not taking: Reported on 01/31/2018), Disp: 14 tablet, Rfl: 0     Review of Systems  Constitutional: Positive for appetite change and fatigue. Negative for chills, diaphoresis, fever and unexpected weight change.  HENT: Positive for dental problem, ear pain, hearing loss and sore throat. Negative for congestion, drooling, facial swelling, mouth sores, sneezing, trouble swallowing and voice change.   Eyes: Positive for visual disturbance. Negative for pain, discharge, redness and  itching.  Respiratory: Positive for shortness of breath. Negative for cough, choking and wheezing.   Cardiovascular: Positive for chest pain and palpitations. Negative for leg swelling.  Gastrointestinal: Positive for abdominal pain and constipation. Negative for blood in stool, diarrhea and vomiting.  Endocrine: Negative for cold intolerance, heat intolerance and polydipsia.  Genitourinary: Positive for decreased urine volume. Negative for dysuria and hematuria.  Musculoskeletal: Positive for arthralgias, back pain and gait problem.  Skin: Negative for rash.  Allergic/Immunologic: Negative for environmental allergies.  Neurological: Positive for  light-headedness and headaches. Negative for seizures and syncope.  Hematological: Negative for adenopathy.  Psychiatric/Behavioral: Positive for agitation and dysphoric mood. Negative for suicidal ideas. The patient is nervous/anxious.     Per HPI unless specifically indicated above     Objective:    BP 130/78 (BP Location: Left Arm, Patient Position: Sitting, Cuff Size: Normal)   Pulse 95   Temp 97.7 F (36.5 C)   Ht 5' 6.75" (1.695 m)   Wt 183 lb 8 oz (83.2 kg)   SpO2 97%   BMI 28.96 kg/m   Wt Readings from Last 3 Encounters:  02/08/18 183 lb 8 oz (83.2 kg)  01/31/18 180 lb (81.6 kg)  01/19/18 186 lb (84.4 kg)    Physical Exam Vitals signs reviewed.  Constitutional:      Appearance: She is well-developed.  HENT:     Head: Normocephalic and atraumatic.  Neck:     Musculoskeletal: Neck supple.  Cardiovascular:     Rate and Rhythm: Normal rate and regular rhythm.  Pulmonary:     Effort: Pulmonary effort is normal.     Breath sounds: Normal breath sounds.  Abdominal:     General: Bowel sounds are normal.     Palpations: Abdomen is soft. There is no mass.     Tenderness: There is no abdominal tenderness.  Lymphadenopathy:     Cervical: No cervical adenopathy.  Skin:    General: Skin is warm and dry.  Neurological:     Mental Status: She is alert and oriented to person, place, and time.  Psychiatric:        Behavior: Behavior normal.         Assessment & Plan:   Encounter Diagnoses  Name Primary?  Marland Kitchen Uncontrolled type 2 diabetes mellitus with hyperglycemia ([place]) Yes  . Thyroid nodule   . Liver mass   . Liver disease   . Hyperlipidemia, unspecified hyperlipidemia type     -pt to Increase lantus to 15u qhs.  She is reminded to avoid skipping meals, monitor her fbs each morning and to call office for fbs < 70 or > 300 -will Add atorvastatin 10mg  -imaging findings:  - ovarian cyst-  Nothing further needed at this time -uterine fibroid- no further testing  needed at this time -thyroid nodule- will order [place] to evaluate further.  If needed, pt will be referred for FNA -will order MRI for further evaluation of liver mass (will not do with contrast due to allergy) -will refer for annual diabetic eye exam -pt was given iFOBT at 12/10 OV.  She says she messed it up and will plan to get another in the near future -recommended pt use Heat or ice for LBP as given in ER -pt is counseled to avoid using diclofenac simultaneously with IBU -pt was Given [facility] application -pt to follow up with bs log in 1 month.   RTO sooner prn

## 2018-02-11 ENCOUNTER — Other Ambulatory Visit: Payer: Self-pay | Admitting: Physician Assistant

## 2018-02-11 DIAGNOSIS — K769 Liver disease, unspecified: Secondary | ICD-10-CM

## 2018-02-16 ENCOUNTER — Ambulatory Visit: Payer: Self-pay | Admitting: Physician Assistant

## 2018-02-17 ENCOUNTER — Ambulatory Visit (HOSPITAL_COMMUNITY)
Admission: RE | Admit: 2018-02-17 | Discharge: 2018-02-17 | Disposition: A | Discharge status: Home / Self Care | Payer: Self-pay | Source: Ambulatory Visit | Attending: Physician Assistant | Admitting: Physician Assistant

## 2018-02-17 DIAGNOSIS — E041 Nontoxic single thyroid nodule: Secondary | ICD-10-CM | POA: Insufficient documentation

## 2018-02-17 DIAGNOSIS — K769 Liver disease, unspecified: Secondary | ICD-10-CM | POA: Insufficient documentation

## 2018-02-17 MED ORDER — GADOBUTROL 1 MMOL/ML IV SOLN
8.0000 mL | Freq: Once | INTRAVENOUS | Status: AC | PRN
  Administered 2018-02-17: 8 mL via INTRAVENOUS

## 2018-02-18 ENCOUNTER — Ambulatory Visit: Payer: Self-pay | Admitting: Physician Assistant

## 2018-02-18 ENCOUNTER — Encounter: Payer: Self-pay | Admitting: Physician Assistant

## 2018-02-18 VITALS — BP 132/86 | HR 94 | Temp 97.3°F | Ht 66.75 in | Wt 184.0 lb

## 2018-02-18 DIAGNOSIS — R300 Dysuria: Secondary | ICD-10-CM

## 2018-02-18 LAB — POCT URINALYSIS DIPSTICK
BILIRUBIN UA: NEGATIVE
Blood, UA: NEGATIVE
Glucose, UA: NEGATIVE
Ketones, UA: NEGATIVE
Leukocytes, UA: NEGATIVE
Nitrite, UA: NEGATIVE
PH UA: 5 (ref 5.0–8.0)
Protein, UA: POSITIVE — AB
Spec Grav, UA: >=1.03 — AB (ref 1.010–1.025)
UROBILINOGEN UA: 0.2 U/dL

## 2018-02-18 NOTE — Progress Notes (Signed)
   BP 132/86   Pulse 94   Temp (!) 97.3 F (36.3 C)   Ht 5' 6.75" (1.695 m)   Wt 184 lb (83.5 kg)   SpO2 98%   BMI 29.03 kg/m    Subjective:    Patient ID: Julie Lambert, female    DOB: [birth year], 53 y.o.   MRN: [id number]  HPI: Julie Lambert is a 53 y.o. female presenting on 02/18/2018 for Dysuria   HPI   No fever  Pt just started today when she stopped by office to pick up her insulin  Relevant past medical, surgical, family and social history reviewed and updated as indicated. Interim medical history since our last visit reviewed. Allergies and medications reviewed and updated.  Review of Systems  Per HPI unless specifically indicated above     Objective:    BP 132/86   Pulse 94   Temp (!) 97.3 F (36.3 C)   Ht 5' 6.75" (1.695 m)   Wt 184 lb (83.5 kg)   SpO2 98%   BMI 29.03 kg/m   Wt Readings from Last 3 Encounters:  02/18/18 184 lb (83.5 kg)  02/08/18 183 lb 8 oz (83.2 kg)  01/31/18 180 lb (81.6 kg)    Physical Exam Vitals signs and nursing note reviewed.  HENT:     Head: Normocephalic and atraumatic.  Pulmonary:     Effort: Pulmonary effort is normal. No respiratory distress.  Neurological:     Mental Status: She is alert and oriented to person, place, and time.  Psychiatric:        Mood and Affect: Mood normal.        Behavior: Behavior normal.     Results for orders placed or performed in visit on 02/18/18  POCT Urinalysis Dipstick  Result Value Ref Range   Color, UA yellow    Clarity, UA clear    Glucose, UA Negative Negative   Bilirubin, UA n    Ketones, UA n    Spec Grav, UA >=1.030 (A) 1.010 - 1.025   Blood, UA n    pH, UA 5.0 5.0 - 8.0   Protein, UA Positive (A) Negative   Urobilinogen, UA 0.2 0.2 or 1.0 E.U./dL   Nitrite, UA n    Leukocytes, UA Negative Negative   Appearance     Odor        Assessment & Plan:   Encounter Diagnosis  Name Primary?  . Dysuria Yes     cousneld pt to drink plenty of water and  avoid sodas Pt to follow up as scheduled

## 2018-03-01 ENCOUNTER — Ambulatory Visit: Payer: Self-pay | Admitting: Physician Assistant

## 2018-03-01 ENCOUNTER — Encounter: Payer: Self-pay | Admitting: Physician Assistant

## 2018-03-01 VITALS — BP 149/92 | HR 90 | Temp 97.9°F | Ht 66.75 in | Wt 183.5 lb

## 2018-03-01 DIAGNOSIS — R1032 Left lower quadrant pain: Secondary | ICD-10-CM

## 2018-03-01 DIAGNOSIS — F17200 Nicotine dependence, unspecified, uncomplicated: Secondary | ICD-10-CM

## 2018-03-01 DIAGNOSIS — F1411 Cocaine abuse, in remission: Secondary | ICD-10-CM

## 2018-03-01 DIAGNOSIS — E041 Nontoxic single thyroid nodule: Secondary | ICD-10-CM

## 2018-03-01 DIAGNOSIS — D1803 Hemangioma of intra-abdominal structures: Secondary | ICD-10-CM | POA: Insufficient documentation

## 2018-03-01 DIAGNOSIS — F1011 Alcohol abuse, in remission: Secondary | ICD-10-CM

## 2018-03-01 DIAGNOSIS — E785 Hyperlipidemia, unspecified: Secondary | ICD-10-CM

## 2018-03-01 DIAGNOSIS — E1165 Type 2 diabetes mellitus with hyperglycemia: Secondary | ICD-10-CM | POA: Insufficient documentation

## 2018-03-01 DIAGNOSIS — R109 Unspecified abdominal pain: Secondary | ICD-10-CM

## 2018-03-01 LAB — POCT URINALYSIS DIPSTICK
Bilirubin, UA: NEGATIVE
Blood, UA: NEGATIVE
Glucose, UA: NEGATIVE
KETONES UA: NEGATIVE
Leukocytes, UA: NEGATIVE
Nitrite, UA: NEGATIVE
Protein, UA: POSITIVE — AB
Spec Grav, UA: >=1.03 — AB (ref 1.010–1.025)
Urobilinogen, UA: 0.2 E.U./dL
pH, UA: 5 (ref 5.0–8.0)

## 2018-03-01 MED ORDER — OMEPRAZOLE 20 MG PO CPDR: 40.0000 mg | DELAYED_RELEASE_CAPSULE | Freq: Every day | ORAL | 1 refills | Status: DC

## 2018-03-01 NOTE — Patient Instructions (Signed)
Financial Counselor- [phone]    --------------------------------------------   Diabetes Mellitus and Nutrition, Adult When you have diabetes (diabetes mellitus), it is very important to have healthy eating habits because your blood sugar (glucose) levels are greatly affected by what you eat and drink. Eating healthy foods in the appropriate amounts, at about the same times every day, can help you:  Control your blood glucose.  Lower your risk of heart disease.  Improve your blood pressure.  Reach or maintain a healthy weight. Every person with diabetes is different, and each person has different needs for a meal plan. Your health care provider may recommend that you work with a diet and nutrition specialist (dietitian) to make a meal plan that is best for you. Your meal plan may vary depending on factors such as:  The calories you need.  The medicines you take.  Your weight.  Your blood glucose, blood pressure, and cholesterol levels.  Your activity level.  Other health conditions you have, such as heart or kidney disease. How do carbohydrates affect me? Carbohydrates, also called carbs, affect your blood glucose level more than any other type of food. Eating carbs naturally raises the amount of glucose in your blood. Carb counting is a method for keeping track of how many carbs you eat. Counting carbs is important to keep your blood glucose at a healthy level, especially if you use insulin or take certain oral diabetes medicines. It is important to know how many carbs you can safely have in each meal. This is different for every person. Your dietitian can help you calculate how many carbs you should have at each meal and for each snack. Foods that contain carbs include:  Bread, cereal, rice, pasta, and crackers.  Potatoes and corn.  Peas, beans, and lentils.  Milk and yogurt.  Fruit and juice.  Desserts, such as cakes, cookies, ice cream, and candy. How does  alcohol affect me? Alcohol can cause a sudden decrease in blood glucose (hypoglycemia), especially if you use insulin or take certain oral diabetes medicines. Hypoglycemia can be a life-threatening condition. Symptoms of hypoglycemia (sleepiness, dizziness, and confusion) are similar to symptoms of having too much alcohol. If your health care provider says that alcohol is safe for you, follow these guidelines:  Limit alcohol intake to no more than 1 drink per day for nonpregnant women and 2 drinks per day for men. One drink equals 12 oz of beer, 5 oz of wine, or 1 oz of hard liquor.  Do not drink on an empty stomach.  Keep yourself hydrated with water, diet soda, or unsweetened iced tea.  Keep in mind that regular soda, juice, and other mixers may contain a lot of sugar and must be counted as carbs. What are tips for following this plan?  Reading food labels  Start by checking the serving size on the "Nutrition Facts" label of packaged foods and drinks. The amount of calories, carbs, fats, and other nutrients listed on the label is based on one serving of the item. Many items contain more than one serving per package.  Check the total grams (g) of carbs in one serving. You can calculate the number of servings of carbs in one serving by dividing the total carbs by 15. For example, if a food has 30 g of total carbs, it would be equal to 2 servings of carbs.  Check the number of grams (g) of saturated and trans fats in one serving. Choose foods that have low or no  amount of these fats.  Check the number of milligrams (mg) of salt (sodium) in one serving. Most people should limit total sodium intake to less than 2,300 mg per day.  Always check the nutrition information of foods labeled as "low-fat" or "nonfat". These foods may be higher in added sugar or refined carbs and should be avoided.  Talk to your dietitian to identify your daily goals for nutrients listed on the  label. Shopping  Avoid buying canned, premade, or processed foods. These foods tend to be high in fat, sodium, and added sugar.  Shop around the outside edge of the grocery store. This includes fresh fruits and vegetables, bulk grains, fresh meats, and fresh dairy. Cooking  Use low-heat cooking methods, such as baking, instead of high-heat cooking methods like deep frying.  Cook using healthy oils, such as olive, canola, or sunflower oil.  Avoid cooking with butter, cream, or high-fat meats. Meal planning  Eat meals and snacks regularly, preferably at the same times every day. Avoid going long periods of time without eating.  Eat foods high in fiber, such as fresh fruits, vegetables, beans, and whole grains. Talk to your dietitian about how many servings of carbs you can eat at each meal.  Eat 4-6 ounces (oz) of lean protein each day, such as lean meat, chicken, fish, eggs, or tofu. One oz of lean protein is equal to: ? 1 oz of meat, chicken, or fish. ? 1 egg. ?  cup of tofu.  Eat some foods each day that contain healthy fats, such as avocado, nuts, seeds, and fish. Lifestyle  Check your blood glucose regularly.  Exercise regularly as told by your health care provider. This may include: ? 150 minutes of moderate-intensity or vigorous-intensity exercise each week. This could be brisk walking, biking, or water aerobics. ? Stretching and doing strength exercises, such as yoga or weightlifting, at least 2 times a week.  Take medicines as told by your health care provider.  Do not use any products that contain nicotine or tobacco, such as cigarettes and e-cigarettes. If you need help quitting, ask your health care provider.  Work with a Social worker or diabetes educator to identify strategies to manage stress and any emotional and social challenges. Questions to ask a health care provider  Do I need to meet with a diabetes educator?  Do I need to meet with a dietitian?  What  number can I call if I have questions?  When are the best times to check my blood glucose? Where to find more information:  American Diabetes Association: diabetes.org  Academy of Nutrition and Dietetics: www.eatright.[employer] of Diabetes and Digestive and Kidney Diseases (NIH): [employer] Summary  A healthy meal plan will help you control your blood glucose and maintain a healthy lifestyle.  Working with a diet and nutrition specialist (dietitian) can help you make a meal plan that is best for you.  Keep in mind that carbohydrates (carbs) and alcohol have immediate effects on your blood glucose levels. It is important to count carbs and to use alcohol carefully. This information is not intended to replace advice given to you by your health care provider. Make sure you discuss any questions you have with your health care provider. Document Released: 10/24/2004 Document Revised: 08/27/2016 Document Reviewed: 03/03/2016 Elsevier Interactive Patient Education  2019 [employer].

## 2018-03-01 NOTE — Progress Notes (Signed)
BP (!) 149/92 (BP Location: Right Arm, Patient Position: Sitting, Cuff Size: Normal)   Pulse 90   Temp 97.9 F (36.6 C)   Ht 5' 6.75" (1.695 m)   Wt 183 lb 8 oz (83.2 kg)   SpO2 100%   BMI 28.96 kg/m    Subjective:    Patient ID: Julie Lambert, female    DOB: Aug 05, 1965, 53 y.o.   MRN: [id number]  HPI: Julie Lambert is a 53 y.o. female presenting on 03/01/2018 for Diabetes   HPI  Pt is currently living at [facility] where she is getting treatment for addiction  Her a1c was 12.5 during November hospitalization. She is living at [place]- they take turns cooking- this makes it challenging to manage the DM as she has no input into most of her evening meals  Reviewed imaging- US thyroid and MRI liver.   Pt is currently using metformin 1g bid and lantus 15u qhs.   bs log reviewed- She had one low reading of 52 that was  Before supper-other readings 135-217 range.    Pt complains of abdominal pain/R flank pain after eating.   This is same thing she went to ER for in December.  She has had CT done which did not show cause of the pain.    Relevant past medical, surgical, family and social history reviewed and updated as indicated. Interim medical history since our last visit reviewed. Allergies and medications reviewed and updated.   Current Outpatient Medications:  .  atorvastatin (LIPITOR) 10 MG tablet, Take 1 tablet (10 mg total) by mouth daily., Disp: 90 tablet, Rfl: 1 .  citalopram (CELEXA) 20 MG tablet, Take 1 tablet (20 mg total) by mouth daily., Disp: 30 tablet, Rfl: 1 .  gabapentin (NEURONTIN) 300 MG capsule, Take 1 capsule (300 mg total) by mouth 3 (three) times daily., Disp: 90 capsule, Rfl: 1 .  ibuprofen (ADVIL,MOTRIN) 200 MG tablet, Take 800 mg by mouth every 6 (six) hours as needed., Disp: , Rfl:  .  Insulin Glargine (LANTUS SOLOSTAR) 100 UNIT/ML Solostar Pen, Inject 15 Units into the skin at bedtime., Disp: 5 pen, Rfl: PRN .  metFORMIN  (GLUCOPHAGE) 1000 MG tablet, Take 1 tablet (1,000 mg total) by mouth 2 (two) times daily with a meal., Disp: 180 tablet, Rfl: 1 .  omeprazole (PRILOSEC) 20 MG capsule, Take 1 capsule (20 mg total) by mouth daily., Disp: 90 capsule, Rfl: 1 .  traZODone (DESYREL) 150 MG tablet, Take 1 tablet (150 mg total) by mouth at bedtime as needed for sleep., Disp: 30 tablet, Rfl: 1 .  diclofenac (VOLTAREN) 50 MG EC tablet, Take 1 tablet (50 mg total) by mouth 2 (two) times daily. (Patient not taking: Reported on 03/01/2018), Disp: 60 tablet, Rfl: 1 .  hydrOXYzine (ATARAX/VISTARIL) 25 MG tablet, Take 1 tablet (25 mg total) by mouth 3 (three) times daily as needed for anxiety. (Patient not taking: Reported on 02/18/2018), Disp: 90 tablet, Rfl: 1 .  ibuprofen (ADVIL,MOTRIN) 800 MG tablet, Take 1 tablet (800 mg total) by mouth every 8 (eight) hours as needed for moderate pain. (Patient not taking: Reported on 03/01/2018), Disp: 30 tablet, Rfl: 0    Review of Systems  Constitutional: Positive for appetite change, fatigue and unexpected weight change. Negative for chills, diaphoresis and fever.  HENT: Positive for dental problem and hearing loss. Negative for congestion, drooling, ear pain, facial swelling, mouth sores, sneezing, sore throat, trouble swallowing and voice change.   Eyes: Positive  for visual disturbance. Negative for pain, discharge, redness and itching.  Respiratory: Positive for shortness of breath. Negative for cough, choking and wheezing.   Cardiovascular: Positive for chest pain. Negative for palpitations and leg swelling.  Gastrointestinal: Positive for abdominal pain. Negative for blood in stool, constipation, diarrhea and vomiting.  Endocrine: Negative for cold intolerance, heat intolerance and polydipsia.  Genitourinary: Positive for decreased urine volume. Negative for dysuria and hematuria.  Musculoskeletal: Positive for arthralgias, back pain and gait problem.  Skin: Negative for rash.   Allergic/Immunologic: Negative for environmental allergies.  Neurological: Positive for light-headedness and headaches. Negative for seizures and syncope.  Hematological: Negative for adenopathy.  Psychiatric/Behavioral: Positive for agitation and dysphoric mood. Negative for suicidal ideas. The patient is nervous/anxious.     Per HPI unless specifically indicated above     Objective:    BP (!) 149/92 (BP Location: Right Arm, Patient Position: Sitting, Cuff Size: Normal)   Pulse 90   Temp 97.9 F (36.6 C)   Ht 5' 6.75" (1.695 m)   Wt 183 lb 8 oz (83.2 kg)   SpO2 100%   BMI 28.96 kg/m   Wt Readings from Last 3 Encounters:  03/01/18 183 lb 8 oz (83.2 kg)  02/18/18 184 lb (83.5 kg)  02/08/18 183 lb 8 oz (83.2 kg)    Physical Exam Vitals signs reviewed.  Constitutional:      Appearance: She is well-developed.  HENT:     Head: Normocephalic and atraumatic.  Neck:     Musculoskeletal: Neck supple.  Cardiovascular:     Rate and Rhythm: Normal rate and regular rhythm.  Pulmonary:     Effort: Pulmonary effort is normal.     Breath sounds: Normal breath sounds.  Abdominal:     General: Bowel sounds are normal.     Palpations: Abdomen is soft. There is no mass.     Tenderness: There is abdominal tenderness in the epigastric area. There is no guarding or rebound.  Lymphadenopathy:     Cervical: No cervical adenopathy.  Skin:    General: Skin is warm and dry.  Neurological:     Mental Status: She is alert and oriented to person, place, and time.  Psychiatric:        Behavior: Behavior normal.          Assessment & Plan:   Encounter Diagnoses  Name Primary?  Marland Kitchen Uncontrolled type 2 diabetes mellitus with hyperglycemia ([place]) Yes  . Left lower quadrant abdominal pain   . Liver hemangioma   . Thyroid nodule   . Flank pain   . Hyperlipidemia, unspecified hyperlipidemia type   . Tobacco use disorder   . Cocaine abuse in remission ([place])   . Alcohol abuse, in remission      -pt Needs FNA of thyroid nodule.  Will order that to be done by interventional radiology -pt encouraged to Turn in charity care application -pt to RTO 2nd week feb with labs done 1st week of feb  -will not adjust diabetes medications at this time as pt had the 1 episode of low bs.  Pt is encouraged to eat regularly. She is given reading information on diabetic diet - ovarian cyst and fibroids on CT 01/31/18 ulikely to be causeing issues -pt to Stop IBU.  She may be having some gastritis issues causing the epigastric tenderness.  This seems unlikely to be contributing to her L abdominal/flank pain.  Will Increase omeprazole to 40mg  daily and Refer to GI for further evaluation  and treatment -will monitor bp.  It was good at OV 02/18/18.  Likely pt anxious about imaging results today -Pt to RTO in February as above.  RTO sooner prn

## 2018-03-08 ENCOUNTER — Ambulatory Visit: Payer: Self-pay | Admitting: Physician Assistant

## 2018-03-10 ENCOUNTER — Telehealth: Payer: Self-pay | Admitting: *Deleted

## 2018-03-10 ENCOUNTER — Ambulatory Visit (HOSPITAL_COMMUNITY): Admission: RE | Admit: 2018-03-10 | Payer: Self-pay | Source: Ambulatory Visit

## 2018-03-10 NOTE — Telephone Encounter (Signed)
Telephoned  Patient, left a message to return a call to [facility] regarding mammo scholarship.

## 2018-03-11 ENCOUNTER — Ambulatory Visit (HOSPITAL_COMMUNITY)
Admission: RE | Admit: 2018-03-11 | Discharge: 2018-03-11 | Disposition: A | Discharge status: Home / Self Care | Payer: Self-pay | Source: Ambulatory Visit | Attending: Physician Assistant | Admitting: Physician Assistant

## 2018-03-11 ENCOUNTER — Ambulatory Visit (INDEPENDENT_AMBULATORY_CARE_PROVIDER_SITE_OTHER): Payer: Self-pay | Admitting: Internal Medicine

## 2018-03-11 ENCOUNTER — Encounter (HOSPITAL_COMMUNITY): Payer: Self-pay

## 2018-03-11 DIAGNOSIS — E041 Nontoxic single thyroid nodule: Secondary | ICD-10-CM | POA: Insufficient documentation

## 2018-03-11 MED ORDER — LIDOCAINE HCL (PF) 2 % IJ SOLN
INTRAMUSCULAR | Status: AC
  Filled 2018-03-11: qty 10

## 2018-03-12 ENCOUNTER — Telehealth: Payer: Self-pay | Admitting: *Deleted

## 2018-03-12 NOTE — Telephone Encounter (Signed)
Telephoned patient, left a message to return a call to [place] regarding mammo scholarship.

## 2018-03-19 ENCOUNTER — Other Ambulatory Visit (HOSPITAL_COMMUNITY)
Admission: RE | Admit: 2018-03-19 | Discharge: 2018-03-19 | Disposition: A | Discharge status: Home / Self Care | Payer: Self-pay | Source: Ambulatory Visit | Attending: Physician Assistant | Admitting: Physician Assistant

## 2018-03-19 ENCOUNTER — Other Ambulatory Visit (HOSPITAL_COMMUNITY): Payer: Self-pay | Admitting: Physician Assistant

## 2018-03-19 DIAGNOSIS — E1165 Type 2 diabetes mellitus with hyperglycemia: Secondary | ICD-10-CM | POA: Insufficient documentation

## 2018-03-19 DIAGNOSIS — E785 Hyperlipidemia, unspecified: Secondary | ICD-10-CM | POA: Insufficient documentation

## 2018-03-19 DIAGNOSIS — Z1231 Encounter for screening mammogram for malignant neoplasm of breast: Secondary | ICD-10-CM

## 2018-03-19 LAB — LIPID PANEL
Cholesterol: 126 mg/dL (ref 0–200)
HDL: 63 mg/dL (ref >40)
LDL Cholesterol: 39 mg/dL (ref 0–99)
TRIGLYCERIDES: 120 mg/dL (ref <150)
Total CHOL/HDL Ratio: 2 RATIO
VLDL: 24 mg/dL (ref 0–40)

## 2018-03-19 LAB — COMPREHENSIVE METABOLIC PANEL
ALT: 21 U/L (ref 0–44)
AST: 19 U/L (ref 15–41)
Albumin: 4.2 g/dL (ref 3.5–5.0)
Alkaline Phosphatase: 72 U/L (ref 38–126)
Anion gap: 9 (ref 5–15)
BUN: 13 mg/dL (ref 6–20)
CO2: 25 mmol/L (ref 22–32)
Calcium: 9.3 mg/dL (ref 8.9–10.3)
Chloride: 106 mmol/L (ref 98–111)
Creatinine, Ser: 0.63 mg/dL (ref 0.44–1.00)
Glucose, Bld: 140 mg/dL — ABNORMAL HIGH (ref 70–99)
Potassium: 3.9 mmol/L (ref 3.5–5.1)
Sodium: 140 mmol/L (ref 135–145)
Total Bilirubin: 0.2 mg/dL — ABNORMAL LOW (ref 0.3–1.2)
Total Protein: 7.5 g/dL (ref 6.5–8.1)

## 2018-03-19 LAB — HEMOGLOBIN A1C
Hgb A1c MFr Bld: 8.2 % — ABNORMAL HIGH (ref 4.8–5.6)
Mean Plasma Glucose: 188.64 mg/dL

## 2018-03-20 LAB — MICROALBUMIN, URINE: Microalb, Ur: 13.1 ug/mL — ABNORMAL HIGH

## 2018-03-23 ENCOUNTER — Encounter: Payer: Self-pay | Admitting: Physician Assistant

## 2018-03-23 ENCOUNTER — Ambulatory Visit: Payer: Self-pay | Admitting: Physician Assistant

## 2018-03-23 VITALS — BP 120/86 | HR 95 | Temp 98.2°F | Ht 66.75 in | Wt 181.0 lb

## 2018-03-23 DIAGNOSIS — F17200 Nicotine dependence, unspecified, uncomplicated: Secondary | ICD-10-CM

## 2018-03-23 DIAGNOSIS — R109 Unspecified abdominal pain: Secondary | ICD-10-CM

## 2018-03-23 DIAGNOSIS — E1165 Type 2 diabetes mellitus with hyperglycemia: Secondary | ICD-10-CM

## 2018-03-23 DIAGNOSIS — R197 Diarrhea, unspecified: Secondary | ICD-10-CM

## 2018-03-23 DIAGNOSIS — F1011 Alcohol abuse, in remission: Secondary | ICD-10-CM

## 2018-03-23 DIAGNOSIS — E785 Hyperlipidemia, unspecified: Secondary | ICD-10-CM

## 2018-03-23 NOTE — Progress Notes (Signed)
BP 120/86 (BP Location: Left Arm, Patient Position: Sitting, Cuff Size: Normal)   Pulse 95   Temp 98.2 F (36.8 C)   Ht 5' 6.75" (1.695 m)   Wt 181 lb (82.1 kg)   SpO2 98%   BMI 28.56 kg/m    Subjective:    Patient ID: Julie Lambert, female    DOB: April 07, 1965, 53 y.o.   MRN: [id number]  HPI: Julie Lambert is a 52 y.o. female presenting on 03/23/2018 for Diabetes   HPI   Pt is still residing at [facility] for treatment of addiction  Pt Has been having diarrhea for over a week.  Julie Lambert doesn't think anyone else in the home is having it.  Julie Lambert thinks it is related to the foods Julie Lambert is eating.  Julie Lambert didn't have any diarrhea the 1 day that Julie Lambert ate out (and didn't eat at the home)  Pt now with epigastric abd pain in addition to the lower abdominal pain that Julie Lambert is scheduled to see GI for later this week  Pt upset today because her father and her step-grandfather both died recently.    Pt has bs log which is reviewed.  Readings very fluctuating.  Pt says Julie Lambert "just can't" eat some of the cooking at the house.  The residents take turns cooking and Julie Lambert says Julie Lambert is completely grossed out because Julie Lambert knows some of the residents do not wash their hands after going to the bathroom.    Relevant past medical, surgical, family and social history reviewed and updated as indicated. Interim medical history since our last visit reviewed. Allergies and medications reviewed and updated.   Current Outpatient Medications:  .  atorvastatin (LIPITOR) 10 MG tablet, Take 1 tablet (10 mg total) by mouth daily., Disp: 90 tablet, Rfl: 1 .  citalopram (CELEXA) 20 MG tablet, Take 1 tablet (20 mg total) by mouth daily., Disp: 30 tablet, Rfl: 1 .  gabapentin (NEURONTIN) 300 MG capsule, Take 1 capsule (300 mg total) by mouth 3 (three) times daily., Disp: 90 capsule, Rfl: 1 .  Insulin Glargine (LANTUS SOLOSTAR) 100 UNIT/ML Solostar Pen, Inject 15 Units into the skin at bedtime., Disp: 5 pen, Rfl: PRN .   metFORMIN (GLUCOPHAGE) 1000 MG tablet, Take 1 tablet (1,000 mg total) by mouth 2 (two) times daily with a meal., Disp: 180 tablet, Rfl: 1 .  omeprazole (PRILOSEC) 20 MG capsule, Take 2 capsules (40 mg total) by mouth daily., Disp: 90 capsule, Rfl: 1 .  traZODone (DESYREL) 150 MG tablet, Take 1 tablet (150 mg total) by mouth at bedtime as needed for sleep., Disp: 30 tablet, Rfl: 1 .  hydrOXYzine (ATARAX/VISTARIL) 25 MG tablet, Take 1 tablet (25 mg total) by mouth 3 (three) times daily as needed for anxiety. (Patient not taking: Reported on 03/23/2018), Disp: 90 tablet, Rfl: 1    Review of Systems  Constitutional: Positive for appetite change, fatigue and unexpected weight change. Negative for chills, diaphoresis and fever.  HENT: Positive for dental problem and hearing loss. Negative for congestion, drooling, ear pain, facial swelling, mouth sores, sneezing, sore throat, trouble swallowing and voice change.   Eyes: Positive for visual disturbance. Negative for pain, discharge, redness and itching.  Respiratory: Positive for shortness of breath. Negative for cough, choking and wheezing.   Cardiovascular: Positive for palpitations. Negative for chest pain and leg swelling.  Gastrointestinal: Positive for diarrhea. Negative for abdominal pain, blood in stool, constipation and vomiting.  Endocrine: Negative for cold intolerance, heat intolerance and  polydipsia.  Genitourinary: Negative for decreased urine volume, dysuria and hematuria.  Musculoskeletal: Positive for arthralgias, back pain and gait problem.  Skin: Negative for rash.  Allergic/Immunologic: Negative for environmental allergies.  Neurological: Positive for headaches. Negative for seizures, syncope and light-headedness.  Hematological: Negative for adenopathy.  Psychiatric/Behavioral: Positive for agitation and dysphoric mood. Negative for suicidal ideas. The patient is nervous/anxious.     Per HPI unless specifically indicated  above     Objective:    BP 120/86 (BP Location: Left Arm, Patient Position: Sitting, Cuff Size: Normal)   Pulse 95   Temp 98.2 F (36.8 C)   Ht 5' 6.75" (1.695 m)   Wt 181 lb (82.1 kg)   SpO2 98%   BMI 28.56 kg/m   Wt Readings from Last 3 Encounters:  03/23/18 181 lb (82.1 kg)  03/01/18 183 lb 8 oz (83.2 kg)  02/18/18 184 lb (83.5 kg)    Physical Exam Vitals signs reviewed.  Constitutional:      Appearance: Julie Lambert is well-developed.  HENT:     Head: Normocephalic and atraumatic.  Neck:     Musculoskeletal: Neck supple.  Cardiovascular:     Rate and Rhythm: Normal rate and regular rhythm.  Pulmonary:     Effort: Pulmonary effort is normal.     Breath sounds: Normal breath sounds.  Abdominal:     General: Abdomen is flat. Bowel sounds are normal.     Palpations: Abdomen is soft. There is no mass.     Tenderness: There is abdominal tenderness in the epigastric area. There is no guarding or rebound.  Lymphadenopathy:     Cervical: No cervical adenopathy.  Skin:    General: Skin is warm and dry.  Neurological:     Mental Status: Julie Lambert is alert and oriented to person, place, and time.  Psychiatric:        Behavior: Behavior normal.     Results for orders placed or performed during the hospital encounter of 03/19/18  Lipid panel  Result Value Ref Range   Cholesterol 126 0 - 200 mg/dL   Triglycerides 120 <150 mg/dL   HDL 63 >40 mg/dL   Total CHOL/HDL Ratio 2.0 RATIO   VLDL 24 0 - 40 mg/dL   LDL Cholesterol 39 0 - 99 mg/dL  Comprehensive metabolic panel  Result Value Ref Range   Sodium 140 135 - 145 mmol/L   Potassium 3.9 3.5 - 5.1 mmol/L   Chloride 106 98 - 111 mmol/L   CO2 25 22 - 32 mmol/L   Glucose, Bld 140 (H) 70 - 99 mg/dL   BUN 13 6 - 20 mg/dL   Creatinine, Ser 0.63 0.44 - 1.00 mg/dL   Calcium 9.3 8.9 - 10.3 mg/dL   Total Protein 7.5 6.5 - 8.1 g/dL   Albumin 4.2 3.5 - 5.0 g/dL   AST 19 15 - 41 U/L   ALT 21 0 - 44 U/L   Alkaline Phosphatase 72 38 - 126  U/L   Total Bilirubin 0.2 (L) 0.3 - 1.2 mg/dL   GFR calc non Af Amer >60 >60 mL/min   GFR calc Af Amer >60 >60 mL/min   Anion gap 9 5 - 15  Hemoglobin A1c  Result Value Ref Range   Hgb A1c MFr Bld 8.2 (H) 4.8 - 5.6 %   Mean Plasma Glucose 188.64 mg/dL  Microalbumin, urine  Result Value Ref Range   Microalb, Ur 13.1 (H) Not Estab. ug/mL      Assessment &  Plan:   Encounter Diagnoses  Name Primary?  Marland Kitchen Uncontrolled type 2 diabetes mellitus with hyperglycemia ([place]) Yes  . Tobacco use disorder   . Alcohol abuse, in remission   . Abdominal pain, unspecified abdominal location   . Diarrhea, unspecified type   . Hyperlipidemia, unspecified hyperlipidemia type     -reviewed labs with pt -Discussed discontinuing the metformin as a possible cause of the diarrhea.  Pt says Julie Lambert does not want to stop the metformin at this time as Julie Lambert thinks the food Julie Lambert is eating is causing her diarrhea -discussed mealtime insulin to get better control of her diabetes- discussed that increasing the lantus would not be safe as Julie Lambert sometimes eats a lot and other times close to nothing (increasing the lantus could put her at risk of hypoglycemia).  Pt would like to not use mealtime insulin like novolog at this time.  Pt would like to give it another 3 months -reviewed results thyroid aspiration which showed scant material.  Will plan to repeat thyroid US in 1 year. -pt to see GI later this week as scheduled -pt to follow up here 3 months.  RTO sooner prn

## 2018-03-25 ENCOUNTER — Emergency Department (HOSPITAL_COMMUNITY)
Admission: EM | Admit: 2018-03-25 | Discharge: 2018-03-25 | Disposition: A | Discharge status: Home / Self Care | Payer: Self-pay | Attending: Emergency Medicine | Admitting: Emergency Medicine

## 2018-03-25 ENCOUNTER — Encounter (HOSPITAL_COMMUNITY): Payer: Self-pay | Admitting: *Deleted

## 2018-03-25 ENCOUNTER — Other Ambulatory Visit: Payer: Self-pay

## 2018-03-25 ENCOUNTER — Encounter (INDEPENDENT_AMBULATORY_CARE_PROVIDER_SITE_OTHER): Payer: Self-pay | Admitting: Internal Medicine

## 2018-03-25 ENCOUNTER — Ambulatory Visit (INDEPENDENT_AMBULATORY_CARE_PROVIDER_SITE_OTHER): Payer: Self-pay | Admitting: Internal Medicine

## 2018-03-25 ENCOUNTER — Emergency Department (HOSPITAL_COMMUNITY): Payer: Self-pay

## 2018-03-25 ENCOUNTER — Other Ambulatory Visit: Payer: Self-pay | Admitting: Physician Assistant

## 2018-03-25 VITALS — BP 132/85 | HR 109 | Temp 98.5°F | Ht 66.5 in | Wt 180.3 lb

## 2018-03-25 DIAGNOSIS — Z794 Long term (current) use of insulin: Secondary | ICD-10-CM | POA: Insufficient documentation

## 2018-03-25 DIAGNOSIS — R52 Pain, unspecified: Secondary | ICD-10-CM

## 2018-03-25 DIAGNOSIS — F17210 Nicotine dependence, cigarettes, uncomplicated: Secondary | ICD-10-CM | POA: Insufficient documentation

## 2018-03-25 DIAGNOSIS — M436 Torticollis: Secondary | ICD-10-CM | POA: Insufficient documentation

## 2018-03-25 DIAGNOSIS — Z79899 Other long term (current) drug therapy: Secondary | ICD-10-CM | POA: Insufficient documentation

## 2018-03-25 DIAGNOSIS — Z87898 Personal history of other specified conditions: Secondary | ICD-10-CM

## 2018-03-25 DIAGNOSIS — R197 Diarrhea, unspecified: Secondary | ICD-10-CM

## 2018-03-25 DIAGNOSIS — E119 Type 2 diabetes mellitus without complications: Secondary | ICD-10-CM | POA: Insufficient documentation

## 2018-03-25 MED ORDER — CYCLOBENZAPRINE HCL 5 MG PO TABS: 5.0000 mg | ORAL_TABLET | Freq: Three times a day (TID) | ORAL | 0 refills | Status: DC | PRN

## 2018-03-25 MED ORDER — OMEPRAZOLE 40 MG PO CPDR: 40.0000 mg | DELAYED_RELEASE_CAPSULE | Freq: Every day | ORAL | 0 refills | Status: DC

## 2018-03-25 MED ORDER — NAPROXEN 500 MG PO TABS: 500.0000 mg | ORAL_TABLET | Freq: Two times a day (BID) | ORAL | 0 refills | Status: DC

## 2018-03-25 MED ORDER — KETOROLAC TROMETHAMINE 30 MG/ML IJ SOLN
30.0000 mg | Freq: Once | INTRAMUSCULAR | Status: AC
  Administered 2018-03-25: 30 mg via INTRAMUSCULAR
  Filled 2018-03-25: qty 1

## 2018-03-25 MED ORDER — CYCLOBENZAPRINE HCL 10 MG PO TABS
10.0000 mg | ORAL_TABLET | Freq: Once | ORAL | Status: AC
  Administered 2018-03-25: 10 mg via ORAL
  Filled 2018-03-25: qty 1

## 2018-03-25 NOTE — Progress Notes (Signed)
Subjective:    Patient ID: Julie Lambert, female    DOB: 15-May-1965, 53 y.o.   MRN: [id number]  HPI Referred by Soyla Dryer PA-C for RUQ pain. She tells me when she eats, she has pain in her left flank and back. She points to her back as to where the pain is. She has had pain since the 2nd week in December. It hurts to move and bend over. She denies any injury.  Her appetite is good. No weight loss. For the past 2 weeks she has had diarrhea. On average she is having about 4 stools a day. BMs are foul smelling. No recent antibiotics.  She is a resident to the [facility] house for substance abuse.  Recent liver enzymes are normal.   02/17/2018 MR Liver W/WO CM: Liver lesion on CT: PRESSION: Exophytic lesion identified in the lateral segment left liver has characteristic imaging features for benign cavernous hemangioma. No further imaging follow-up recommended.  Otherwise unremarkable abdominal MRI. CBC    Component Value Date/Time   WBC 10.1 01/31/2018 1530   RBC 4.35 01/31/2018 1530   HGB 12.9 01/31/2018 1530   HGB 13.4 06/10/2014 2245   HCT 39.1 01/31/2018 1530   HCT 39.1 06/10/2014 2245   PLT 298 01/31/2018 1530   PLT 177 06/10/2014 2245   MCV 89.9 01/31/2018 1530   MCV 88 06/10/2014 2245   MCH 29.7 01/31/2018 1530   MCHC 33.0 01/31/2018 1530   RDW 11.9 01/31/2018 1530   RDW 13.5 06/10/2014 2245   LYMPHSABS 3.0 11/25/2017 1331   LYMPHSABS 3.1 06/10/2014 2245   MONOABS 0.9 11/25/2017 1331   MONOABS 0.9 06/10/2014 2245   EOSABS 0.1 11/25/2017 1331   EOSABS 0.1 06/10/2014 2245   BASOSABS 0.1 11/25/2017 1331   BASOSABS 0.1 06/10/2014 2245   CMP Latest Ref Rng & Units 03/19/2018 01/31/2018 01/28/2018  Glucose 70 - 99 mg/dL 140(H) 118(H) 167(H)  BUN 6 - 20 mg/dL 13 14 17   Creatinine 0.44 - 1.00 mg/dL 0.63 0.62 0.53  Sodium 135 - 145 mmol/L 140 140 138  Potassium 3.5 - 5.1 mmol/L 3.9 3.9 3.9  Chloride 98 - 111 mmol/L 106 110 109  CO2 22 - 32 mmol/L 25 23 21(L)  Calcium  8.9 - 10.3 mg/dL 9.3 9.1 8.8(L)  Total Protein 6.5 - 8.1 g/dL 7.5 - -  Total Bilirubin 0.3 - 1.2 mg/dL 0.2(L) - -  Alkaline Phos 38 - 126 U/L 72 - -  AST 15 - 41 U/L 19 - -  ALT 0 - 44 U/L 21 - -     Review of Systems  Past Medical History:  Diagnosis Date  . Bipolar 2 disorder ([place])   . Diabetes mellitus without complication ([place])   . Diverticulitis   . Diverticulosis   . GERD (gastroesophageal reflux disease)   . Sciatica   . Substance abuse ([place])   . Uterine fibroid     Past Surgical History:  Procedure Laterality Date  . BACK SURGERY    . CHOLECYSTECTOMY    . LUMBAR SPINE SURGERY      Allergies  Allergen Reactions  . Ivp Dye [Iodinated Diagnostic Agents] Itching    Patient stated she had itching on her back after IV injection    Current Outpatient Medications on File Prior to Visit  Medication Sig Dispense Refill  . atorvastatin (LIPITOR) 10 MG tablet Take 1 tablet (10 mg total) by mouth daily. 90 tablet 1  . citalopram (CELEXA) 20 MG tablet Take  1 tablet (20 mg total) by mouth daily. 30 tablet 1  . gabapentin (NEURONTIN) 300 MG capsule Take 1 capsule (300 mg total) by mouth 3 (three) times daily. 90 capsule 1  . hydrOXYzine (ATARAX/VISTARIL) 25 MG tablet Take 1 tablet (25 mg total) by mouth 3 (three) times daily as needed for anxiety. 90 tablet 1  . Insulin Glargine (LANTUS SOLOSTAR) 100 UNIT/ML Solostar Pen Inject 15 Units into the skin at bedtime. 5 pen PRN  . metFORMIN (GLUCOPHAGE) 1000 MG tablet Take 1 tablet (1,000 mg total) by mouth 2 (two) times daily with a meal. 180 tablet 1  . omeprazole (PRILOSEC) 20 MG capsule Take 2 capsules (40 mg total) by mouth daily. 90 capsule 1  . traZODone (DESYREL) 150 MG tablet Take 1 tablet (150 mg total) by mouth at bedtime as needed for sleep. 30 tablet 1   No current facility-administered medications on file prior to visit.         Objective:   Physical Exam Blood pressure 132/85, pulse (!) 109, temperature 98.5 F  (36.9 C), height 5' 6.5" (1.689 m), weight 180 lb 4.8 oz (81.8 kg). Alert and oriented. Skin warm and dry. Oral mucosa is moist.   . Sclera anicteric, conjunctivae is pink. Thyroid not enlarged. No cervical lymphadenopathy. Lungs clear. Heart regular rate and rhythm.  Abdomen is soft. Bowel sounds are positive. No hepatomegaly. No abdominal masses felt. Rt flank tenderness.  No edema to lower extremities.           Assessment & Plan:  RT flank pain. Am going to get a urinalysis. I do not think the flank pain is GI related. However will consider screening colonoscopy once I have lab back.  Diarrhea: am going to get a GI pathogen. Further recommendations to follow.

## 2018-03-25 NOTE — ED Notes (Signed)
Once pt is placed in room she began calling out for blankets, upon entering pt's room pt began to cry and moan. Upon closing door and leaving pt, pt is no longer moaning or crying.

## 2018-03-25 NOTE — ED Notes (Signed)
Patient transported to X-ray 

## 2018-03-25 NOTE — ED Triage Notes (Signed)
Pt c/o posterior neck pain that radiates to head and arms, denies any injury, denies any previous problems with neck, states that the pain started last night,

## 2018-03-25 NOTE — ED Provider Notes (Signed)
[facility] EMERGENCY DEPARTMENT Provider Note   CSN: [id number] Arrival date & time: 03/25/18  2013     History   Chief Complaint Chief Complaint  Patient presents with  . Neck Pain    HPI Julie Lambert is a 53 y.o. female with past medical history significant for bipolar disorder, well-controlled diabetes, GERD, sciatica, prior lumbar surgical intervention and history of substance abuse, currently in a rehab facility presenting with a 1 day history of posterior neck pain which radiates to her posterior head and to her bilateral shoulders.  She states simply waking last night and had severe pain in her neck which is worsened with movement, describing having to support her head while getting up off her pillow secondary to pain.  She has decreased range of motion which is worsened with attempts to rotate her head to the left.  She denies radiation of pain into her arms but does report chronic pain in her bilateral hands which has been present for months.  She denies any obvious injuries to her neck, but does recall having a near slip and fall about 6 weeks ago which "jarred" her neck.  She denies fevers or chills, no recent infections, no weakness or numbness in her upper or lower extremities.  She has been applying heat to her neck without improvement.  The history is provided by the patient.  Neck Pain  Associated symptoms: no fever, no numbness and no weakness     Past Medical History:  Diagnosis Date  . Bipolar 2 disorder ([place])   . Diabetes mellitus without complication ([place])   . Diverticulitis   . Diverticulosis   . GERD (gastroesophageal reflux disease)   . Sciatica   . Substance abuse ([place])   . Uterine fibroid     Patient Active Problem List   Diagnosis Date Noted  . Liver hemangioma 03/01/2018  . Uncontrolled type 2 diabetes mellitus with hyperglycemia ([place]) 03/01/2018  . Tobacco use disorder 01/04/2018  . Suicidal ideation 01/04/2018  . Bipolar I disorder, most  recent episode depressed, severe without psychotic features ([place]) 01/02/2018  . Bipolar 2 disorder ([place]) 12/25/2017  . Diabetes mellitus without complication ([place]) 23/76/2831  . Severe alcohol use disorder ([place]) 12/25/2017  . Cocaine use disorder, moderate, dependence ([place]) 12/25/2017    Past Surgical History:  Procedure Laterality Date  . BACK SURGERY    . CHOLECYSTECTOMY    . LUMBAR SPINE SURGERY       OB History   No obstetric history on file.      Home Medications    Prior to Admission medications   Medication Sig Start Date End Date Taking? Authorizing Provider  atorvastatin (LIPITOR) 10 MG tablet Take 1 tablet (10 mg total) by mouth daily. 02/08/18   Soyla Dryer, PA-C  citalopram (CELEXA) 20 MG tablet Take 1 tablet (20 mg total) by mouth daily. 01/11/18   [name], Herma Ard B, MD  cyclobenzaprine (FLEXERIL) 5 MG tablet Take 1 tablet (5 mg total) by mouth 3 (three) times daily as needed for muscle spasms. 03/25/18   Evalee Jefferson, PA-C  gabapentin (NEURONTIN) 300 MG capsule Take 1 capsule (300 mg total) by mouth 3 (three) times daily. 02/08/18   Soyla Dryer, PA-C  hydrOXYzine (ATARAX/VISTARIL) 25 MG tablet Take 1 tablet (25 mg total) by mouth 3 (three) times daily as needed for anxiety. 01/06/18   [name], Herma Ard B, MD  Insulin Glargine (LANTUS SOLOSTAR) 100 UNIT/ML Solostar Pen Inject 15 Units into the skin at bedtime. 02/08/18  Soyla Dryer, PA-C  metFORMIN (GLUCOPHAGE) 1000 MG tablet Take 1 tablet (1,000 mg total) by mouth 2 (two) times daily with a meal. 01/19/18   Soyla Dryer, PA-C  naproxen (NAPROSYN) 500 MG tablet Take 1 tablet (500 mg total) by mouth 2 (two) times daily. 03/25/18   Evalee Jefferson, PA-C  omeprazole (PRILOSEC) 20 MG capsule Take 2 capsules (40 mg total) by mouth daily. 03/01/18   Soyla Dryer, PA-C  traZODone (DESYREL) 150 MG tablet Take 1 tablet (150 mg total) by mouth at bedtime as needed for sleep. 01/11/18   Clovis Fredrickson, MD     Family History Family History  Problem Relation Age of Onset  . CAD Mother   . Diabetes Mother   . Heart disease Mother   . Parkinson's disease Mother   . Kidney disease Mother   . Cancer Father        prostate cancer    Social History Social History   Tobacco Use  . Smoking status: Current Every Day Smoker    Packs/day: 0.25    Years: 40.00    Pack years: 10.00    Types: Cigarettes  . Smokeless tobacco: Never Used  Substance Use Topics  . Alcohol use: Not Currently    Comment: none since 12-25-2017. previous heavy drinker  . Drug use: Not Currently    Types: "Crack" cocaine    Comment: LAST USED NOV. 15, 2019     Allergies   Ivp dye [iodinated diagnostic agents]   Review of Systems Review of Systems  Constitutional: Negative for fever.  Musculoskeletal: Positive for arthralgias and neck pain. Negative for joint swelling and myalgias.  Neurological: Negative for weakness and numbness.     Physical Exam Updated Vital Signs BP (!) 143/90 (BP Location: Left Arm)   Pulse 98   Temp 97.8 F (36.6 C) (Oral)   Resp 20   Ht 5' 6.5" (1.689 m)   Wt 81.6 kg   SpO2 100%   BMI 28.62 kg/m   Physical Exam Vitals signs and nursing note reviewed.  Constitutional:      Appearance: She is well-developed.  HENT:     Head: Normocephalic.  Neck:     Musculoskeletal: Decreased range of motion. Spinous process tenderness and muscular tenderness present.  Cardiovascular:     Rate and Rhythm: Normal rate.     Comments: Pedal pulses normal. Pulmonary:     Effort: Pulmonary effort is normal.  Musculoskeletal:     Cervical back: She exhibits decreased range of motion, tenderness and spasm.     Lumbar back: She exhibits no tenderness, no swelling, no edema and no spasm.  Skin:    General: Skin is warm and dry.  Neurological:     Mental Status: She is alert.     Sensory: No sensory deficit.     Motor: No tremor or atrophy.     Gait: Gait normal.     Comments: No  strength deficit noted in hand, wrist or elbow flexor and extensor muscle groups.  Equal grip strength      ED Treatments / Results  Labs (all labs ordered are listed, but only abnormal results are displayed) Labs Reviewed - No data to display  EKG None  Radiology Dg Cervical Spine Complete  Result Date: 03/25/2018 CLINICAL DATA:  Neck pain, no known injury, initial encounter EXAM: CERVICAL SPINE - COMPLETE 4+ VIEW COMPARISON:  None. FINDINGS: Seven cervical segments are well visualized. Vertebral body height is well maintained. Osteophytic changes  are noted from C4-C7. No prevertebral soft tissue changes are noted. The neural foramina are patent bilaterally. The odontoid is within normal limits. Visualized upper ribcage is unremarkable. IMPRESSION: Multilevel degenerative change without acute abnormality. Electronically Signed   By: Inez Catalina M.D.   On: 03/25/2018 21:39    Procedures Procedures (including critical care time)  Medications Ordered in ED Medications  ketorolac (TORADOL) 30 MG/ML injection 30 mg (30 mg Intramuscular Given 03/25/18 2058)  cyclobenzaprine (FLEXERIL) tablet 10 mg (10 mg Oral Given 03/25/18 2059)     Initial Impression / Assessment and Plan / ED Course  I have reviewed the triage vital signs and the nursing notes.  Pertinent labs & imaging results that were available during my care of the patient were reviewed by me and considered in my medical decision making (see chart for details).     No neuro deficit on exam or by history to suggest emergent or surgical presentation.  discussed worsened sx that should prompt immediate re-evaluation including distal weakness or persistent pain.  Discussed heat tx followed by ROM stretches.  Naproxen, flexeril. F/u pcp 1 week if not improved.        Final Clinical Impressions(s) / ED Diagnoses   Final diagnoses:  Pain  Torticollis    ED Discharge Orders         Ordered    naproxen (NAPROSYN) 500 MG  tablet  2 times daily     03/25/18 2149    cyclobenzaprine (FLEXERIL) 5 MG tablet  3 times daily PRN     03/25/18 2149           Evalee Jefferson, Hershal Coria 03/25/18 2335    [name] Ferguson, MD 03/26/18 1529

## 2018-03-25 NOTE — Discharge Instructions (Addendum)
Take the medications prescribed to help you with your neck pain and muscle spasm.  And application of a heating pad applied to your neck and shoulder muscles for 20 minutes 4 times daily can also be very helpful.  After this heat treatment complete the range of motion exercises as demonstrated here.  Expect gradual improvement in the pain and your ability to flex and bend your neck each day.  You do have some degree of arthritis in some vertebrae in your cervical spine, but I suspect today's symptoms are more related to acute muscle spasm.  Plan to follow-up with your primary doctor if your symptoms are not improving with today's treatment plan.

## 2018-03-25 NOTE — Patient Instructions (Signed)
GI pathogen and urinalysis.

## 2018-03-26 ENCOUNTER — Other Ambulatory Visit (HOSPITAL_COMMUNITY)
Admission: RE | Admit: 2018-03-26 | Discharge: 2018-03-26 | Disposition: A | Discharge status: Home / Self Care | Payer: Self-pay | Source: Ambulatory Visit | Attending: Internal Medicine | Admitting: Internal Medicine

## 2018-03-26 DIAGNOSIS — R197 Diarrhea, unspecified: Secondary | ICD-10-CM | POA: Insufficient documentation

## 2018-03-26 DIAGNOSIS — Z87898 Personal history of other specified conditions: Secondary | ICD-10-CM | POA: Insufficient documentation

## 2018-03-26 LAB — URINALYSIS, ROUTINE W REFLEX MICROSCOPIC
Bilirubin Urine: NEGATIVE
Glucose, UA: NEGATIVE mg/dL
Hgb urine dipstick: NEGATIVE
Ketones, ur: NEGATIVE mg/dL
Nitrite: NEGATIVE
PH: 5 (ref 5.0–8.0)
Protein, ur: NEGATIVE mg/dL
SPECIFIC GRAVITY, URINE: 1.021 (ref 1.005–1.030)

## 2018-03-27 LAB — GASTROINTESTINAL PANEL BY PCR, STOOL (REPLACES STOOL CULTURE)

## 2018-03-29 ENCOUNTER — Encounter (HOSPITAL_COMMUNITY): Payer: Self-pay

## 2018-03-29 ENCOUNTER — Ambulatory Visit (HOSPITAL_COMMUNITY): Payer: Self-pay

## 2018-04-01 ENCOUNTER — Telehealth (INDEPENDENT_AMBULATORY_CARE_PROVIDER_SITE_OTHER): Payer: Self-pay | Admitting: Internal Medicine

## 2018-04-01 NOTE — Telephone Encounter (Signed)
Ann, EGD with propofol  

## 2018-04-01 NOTE — Telephone Encounter (Signed)
err

## 2018-04-01 NOTE — Telephone Encounter (Signed)
Ann, I meant she needed a colonoscopy. Does not need and EGD. Will need a colonoscopy with propofol.

## 2018-04-05 ENCOUNTER — Other Ambulatory Visit (INDEPENDENT_AMBULATORY_CARE_PROVIDER_SITE_OTHER): Payer: Self-pay | Admitting: Internal Medicine

## 2018-04-05 ENCOUNTER — Encounter (HOSPITAL_COMMUNITY): Payer: Self-pay

## 2018-04-05 ENCOUNTER — Ambulatory Visit (HOSPITAL_COMMUNITY)
Admission: RE | Admit: 2018-04-05 | Discharge: 2018-04-05 | Disposition: A | Discharge status: Home / Self Care | Payer: Self-pay | Source: Ambulatory Visit | Attending: Physician Assistant | Admitting: Physician Assistant

## 2018-04-05 DIAGNOSIS — Z1211 Encounter for screening for malignant neoplasm of colon: Secondary | ICD-10-CM

## 2018-04-05 DIAGNOSIS — Z1231 Encounter for screening mammogram for malignant neoplasm of breast: Secondary | ICD-10-CM | POA: Insufficient documentation

## 2018-04-05 NOTE — Telephone Encounter (Signed)
Julie Lambert, I meant she needed a colonoscopy. Does not need and EGD. Will need a colonoscopy with propofol.

## 2018-04-06 ENCOUNTER — Encounter (INDEPENDENT_AMBULATORY_CARE_PROVIDER_SITE_OTHER): Payer: Self-pay | Admitting: *Deleted

## 2018-04-06 ENCOUNTER — Other Ambulatory Visit (HOSPITAL_COMMUNITY): Payer: Self-pay | Admitting: Physician Assistant

## 2018-04-06 DIAGNOSIS — Z1211 Encounter for screening for malignant neoplasm of colon: Secondary | ICD-10-CM | POA: Insufficient documentation

## 2018-04-06 DIAGNOSIS — R928 Other abnormal and inconclusive findings on diagnostic imaging of breast: Secondary | ICD-10-CM

## 2018-04-06 NOTE — Telephone Encounter (Signed)
TCS sch'd 04/16/18, preop 3/3 at 145, spoke to lady at [facility], she will give patient message and have her come by office for instructions/prep kit

## 2018-04-06 NOTE — Patient Instructions (Signed)
Julie Lambert  04/06/2018     @PREFPERIOPPHARMACY @   Your procedure is scheduled on  04/16/2018 .  Report to Julie Lambert at  820  A.M.  Call this number if you have problems the morning of surgery:  [phone]   Remember:  Follow the diet and prep instructions given to you by Dr Julie Lambert office.                      Take these medicines the morning of surgery with A SIP OF WATER  Celexa, flexaril, gabapentin, vistaril ( if needed), prilosec. Only take 1/2 of your usual insulin dose the night before your procedure. DO NOT take any medications for diabetes the morning of your procedure.    Do not wear jewelry, make-up or nail polish.  Do not wear lotions, powders, or perfumes, or deodorant.  Do not shave 48 hours prior to surgery.  Men may shave face and neck.  Do not bring valuables to the hospital.  [facility] is not responsible for any belongings or valuables.  Contacts, dentures or bridgework may not be worn into surgery.  Leave your suitcase in the car.  After surgery it may be brought to your room.  For patients admitted to the hospital, discharge time will be determined by your treatment team.  Patients discharged the day of surgery will not be allowed to drive home.   Name and phone number of your driver:   family Special instructions:  None  Please read over the following fact sheets that you were given. Anesthesia Post-op Instructions and Care and Recovery After Surgery       Colonoscopy, Adult, Care After This sheet gives you information about how to care for yourself after your procedure. Your health care provider may also give you more specific instructions. If you have problems or questions, contact your health care provider. What can I expect after the procedure? After the procedure, it is common to have:  A small amount of blood in your stool for 24 hours after the procedure.  Some gas.  Mild abdominal cramping or bloating. Follow these  instructions at home: General instructions  For the first 24 hours after the procedure: ? Do not drive or use machinery. ? Do not sign important documents. ? Do not drink alcohol. ? Do your regular daily activities at a slower pace than normal. ? Eat soft, easy-to-digest foods.  Take over-the-counter or prescription medicines only as told by your health care provider. Relieving cramping and bloating   Try walking around when you have cramps or feel bloated.  Apply heat to your abdomen as told by your health care provider. Use a heat source that your health care provider recommends, such as a moist heat pack or a heating pad. ? Place a towel between your skin and the heat source. ? Leave the heat on for 20-30 minutes. ? Remove the heat if your skin turns bright red. This is especially important if you are unable to feel pain, heat, or cold. You may have a greater risk of getting burned. Eating and drinking   Drink enough fluid to keep your urine pale yellow.  Resume your normal diet as instructed by your health care provider. Avoid heavy or fried foods that are hard to digest.  Avoid drinking alcohol for as long as instructed by your health care provider. Contact a health care provider if:  You have blood in your stool 2-3  days after the procedure. Get help right away if:  You have more than a small spotting of blood in your stool.  You pass large blood clots in your stool.  Your abdomen is swollen.  You have nausea or vomiting.  You have a fever.  You have increasing abdominal pain that is not relieved with medicine. Summary  After the procedure, it is common to have a small amount of blood in your stool. You may also have mild abdominal cramping and bloating.  For the first 24 hours after the procedure, do not drive or use machinery, sign important documents, or drink alcohol.  Contact your health care provider if you have a lot of blood in your stool, nausea or  vomiting, a fever, or increased abdominal pain. This information is not intended to replace advice given to you by your health care provider. Make sure you discuss any questions you have with your health care provider. Document Released: 09/11/2003 Document Revised: 11/19/2016 Document Reviewed: 04/10/2015 Elsevier Interactive Patient Education  2019 Julie Lambert, Care After These instructions provide you with information about caring for yourself after your procedure. Your health care provider may also give you more specific instructions. Your treatment has been planned according to current medical practices, but problems sometimes occur. Call your health care provider if you have any problems or questions after your procedure. What can I expect after the procedure? After your procedure, you may:  Feel sleepy for several hours.  Feel clumsy and have poor balance for several hours.  Feel forgetful about what happened after the procedure.  Have poor judgment for several hours.  Feel nauseous or vomit.  Have a sore throat if you had a breathing tube during the procedure. Follow these instructions at home: For at least 24 hours after the procedure:      Have a responsible adult stay with you. It is important to have someone help care for you until you are awake and alert.  Rest as needed.  Do not: ? Participate in activities in which you could fall or become injured. ? Drive. ? Use heavy machinery. ? Drink alcohol. ? Take sleeping pills or medicines that cause drowsiness. ? Make important decisions or sign legal documents. ? Take care of children on your own. Eating and drinking  Follow the diet that is recommended by your health care provider.  If you vomit, drink water, juice, or soup when you can drink without vomiting.  Make sure you have little or no nausea before eating solid foods. General instructions  Take over-the-counter and  prescription medicines only as told by your health care provider.  If you have sleep apnea, surgery and certain medicines can increase your risk for breathing problems. Follow instructions from your health care provider about wearing your sleep device: ? Anytime you are sleeping, including during daytime naps. ? While taking prescription pain medicines, sleeping medicines, or medicines that make you drowsy.  If you smoke, do not smoke without supervision.  Keep all follow-up visits as told by your health care provider. This is important. Contact a health care provider if:  You keep feeling nauseous or you keep vomiting.  You feel light-headed.  You develop a rash.  You have a fever. Get help right away if:  You have trouble breathing. Summary  For several hours after your procedure, you may feel sleepy and have poor judgment.  Have a responsible adult stay with you for at least 24 hours or until  you are awake and alert. This information is not intended to replace advice given to you by your health care provider. Make sure you discuss any questions you have with your health care provider. Document Released: 05/20/2015 Document Revised: 09/12/2016 Document Reviewed: 05/20/2015 Elsevier Interactive Patient Education  2019 [employer].

## 2018-04-12 ENCOUNTER — Encounter (INDEPENDENT_AMBULATORY_CARE_PROVIDER_SITE_OTHER): Payer: Self-pay | Admitting: *Deleted

## 2018-04-12 NOTE — Telephone Encounter (Signed)
Patient never came to get her prep kit. I tried calling [facility] again today and was told they couldn't deny or confirm patient was there. I have canceled her TCS and preop and will mail her a letter to call me to scheduled

## 2018-04-12 NOTE — Telephone Encounter (Signed)
noted 

## 2018-04-13 ENCOUNTER — Encounter (HOSPITAL_COMMUNITY)
Admission: RE | Admit: 2018-04-13 | Discharge: 2018-04-13 | Disposition: A | Discharge status: Home / Self Care | Payer: Self-pay | Source: Ambulatory Visit | Attending: Internal Medicine | Admitting: Internal Medicine

## 2018-04-13 ENCOUNTER — Encounter (HOSPITAL_COMMUNITY): Payer: Self-pay

## 2018-04-14 ENCOUNTER — Telehealth: Payer: Self-pay | Admitting: Student

## 2018-04-14 NOTE — Telephone Encounter (Signed)
Pt called back regarding message left on son's voicemail. LPN notified pt that her screening mammogram was abnormal and radiology was attempting to contact her to schedule f/u.  At this time patient is no longer considered a patient at [facility] due to needing to do annual screening to be a patient. This was explained to the patient and she verbalizes understanding. LPN explained to patient that she is to establish care as soon as possible to f/u on her abd results. Pt verbalized understanding.

## 2018-04-16 ENCOUNTER — Encounter (HOSPITAL_COMMUNITY): Admission: RE | Payer: Self-pay | Source: Home / Self Care

## 2018-04-16 ENCOUNTER — Ambulatory Visit (HOSPITAL_COMMUNITY): Admission: RE | Admit: 2018-04-16 | Payer: Self-pay | Source: Home / Self Care | Admitting: Internal Medicine

## 2018-04-16 SURGERY — COLONOSCOPY WITH PROPOFOL
Anesthesia: Monitor Anesthesia Care

## 2018-06-21 ENCOUNTER — Ambulatory Visit: Payer: Self-pay | Admitting: Physician Assistant

## 2018-08-10 ENCOUNTER — Telehealth: Payer: Self-pay | Admitting: Physician Assistant

## 2018-08-12 ENCOUNTER — Other Ambulatory Visit: Payer: Self-pay

## 2018-08-12 ENCOUNTER — Encounter: Payer: Self-pay | Admitting: *Deleted

## 2018-08-12 ENCOUNTER — Telehealth: Payer: Self-pay | Admitting: Family Medicine

## 2018-08-12 ENCOUNTER — Emergency Department
Admission: EM | Admit: 2018-08-12 | Discharge: 2018-08-13 | Disposition: A | Discharge status: Home / Self Care | Payer: Self-pay | Attending: Emergency Medicine | Admitting: Emergency Medicine

## 2018-08-12 DIAGNOSIS — R109 Unspecified abdominal pain: Secondary | ICD-10-CM

## 2018-08-12 DIAGNOSIS — E119 Type 2 diabetes mellitus without complications: Secondary | ICD-10-CM | POA: Insufficient documentation

## 2018-08-12 DIAGNOSIS — A5901 Trichomonal vulvovaginitis: Secondary | ICD-10-CM | POA: Insufficient documentation

## 2018-08-12 DIAGNOSIS — F17210 Nicotine dependence, cigarettes, uncomplicated: Secondary | ICD-10-CM | POA: Insufficient documentation

## 2018-08-12 DIAGNOSIS — Z79899 Other long term (current) drug therapy: Secondary | ICD-10-CM | POA: Insufficient documentation

## 2018-08-12 NOTE — Telephone Encounter (Signed)
Patient wants to be seen and says that she only needs medication for trich asap.

## 2018-08-12 NOTE — Telephone Encounter (Signed)
Returned patient phone call. No answer, unable to leave a voicemail due to phone message of "voicemail box is full."

## 2018-08-12 NOTE — ED Triage Notes (Signed)
Pt has low abd pain with vaginal discharge and burning with urination   Sx for 2 days .  Pt alert.

## 2018-08-13 ENCOUNTER — Emergency Department: Payer: Self-pay

## 2018-08-13 LAB — URINALYSIS, COMPLETE (UACMP) WITH MICROSCOPIC
Bacteria, UA: NONE SEEN
Bilirubin Urine: NEGATIVE
Glucose, UA: 50 mg/dL — AB
Ketones, ur: NEGATIVE mg/dL
Nitrite: NEGATIVE
Protein, ur: NEGATIVE mg/dL
RBC / HPF: >50 RBC/hpf — ABNORMAL HIGH (ref 0–5)
Specific Gravity, Urine: 1.025 (ref 1.005–1.030)
WBC, UA: >50 WBC/hpf — ABNORMAL HIGH (ref 0–5)
pH: 5 (ref 5.0–8.0)

## 2018-08-13 LAB — COMPREHENSIVE METABOLIC PANEL WITH GFR
ALT: 16 U/L (ref 0–44)
AST: 15 U/L (ref 15–41)
Albumin: 3.7 g/dL (ref 3.5–5.0)
Alkaline Phosphatase: 87 U/L (ref 38–126)
Anion gap: 9 (ref 5–15)
BUN: 17 mg/dL (ref 6–20)
CO2: 23 mmol/L (ref 22–32)
Calcium: 9.1 mg/dL (ref 8.9–10.3)
Chloride: 109 mmol/L (ref 98–111)
Creatinine, Ser: 0.81 mg/dL (ref 0.44–1.00)
GFR calc Af Amer: >60 mL/min
GFR calc non Af Amer: >60 mL/min
Glucose, Bld: 239 mg/dL — ABNORMAL HIGH (ref 70–99)
Potassium: 3.7 mmol/L (ref 3.5–5.1)
Sodium: 141 mmol/L (ref 135–145)
Total Bilirubin: 0.4 mg/dL (ref 0.3–1.2)
Total Protein: 6.9 g/dL (ref 6.5–8.1)

## 2018-08-13 LAB — WET PREP, GENITAL
Clue Cells Wet Prep HPF POC: NONE SEEN
Sperm: NONE SEEN
Yeast Wet Prep HPF POC: NONE SEEN

## 2018-08-13 LAB — CBC
HCT: 39 % (ref 36.0–46.0)
Hemoglobin: 12.6 g/dL (ref 12.0–15.0)
MCH: 29.6 pg (ref 26.0–34.0)
MCHC: 32.3 g/dL (ref 30.0–36.0)
MCV: 91.8 fL (ref 80.0–100.0)
Platelets: 236 10*3/uL (ref 150–400)
RBC: 4.25 MIL/uL (ref 3.87–5.11)
RDW: 13.2 % (ref 11.5–15.5)
WBC: 12.7 10*3/uL — ABNORMAL HIGH (ref 4.0–10.5)
nRBC: 0 % (ref 0.0–0.2)

## 2018-08-13 LAB — CHLAMYDIA/NGC RT PCR (ARMC ONLY)
Chlamydia Tr: NOT DETECTED
N gonorrhoeae: NOT DETECTED

## 2018-08-13 MED ORDER — KETOROLAC TROMETHAMINE 30 MG/ML IJ SOLN
15.0000 mg | Freq: Once | INTRAMUSCULAR | Status: AC
  Administered 2018-08-13: 15 mg via INTRAVENOUS
  Filled 2018-08-13: qty 1

## 2018-08-13 MED ORDER — METRONIDAZOLE 500 MG PO TABS: 500.0000 mg | ORAL_TABLET | Freq: Two times a day (BID) | ORAL | 0 refills | Status: AC

## 2018-08-13 MED ORDER — METRONIDAZOLE 500 MG PO TABS
500.0000 mg | ORAL_TABLET | Freq: Once | ORAL | Status: AC
  Administered 2018-08-13: 500 mg via ORAL
  Filled 2018-08-13: qty 1

## 2018-08-13 MED ORDER — IBUPROFEN 600 MG PO TABS: 600.0000 mg | ORAL_TABLET | Freq: Four times a day (QID) | ORAL | 0 refills | Status: DC | PRN

## 2018-08-13 NOTE — ED Provider Notes (Signed)
[facility] Emergency Department Provider Note  ____________________________________________  Time seen: Approximately 1:32 AM  I have reviewed the triage vital signs and the nursing notes.   HISTORY  Chief Complaint Abdominal Pain and Vaginal Discharge   HPI Julie Lambert is a 53 y.o. female with history as listed below who presents for evaluation of abdominal pain and vaginal discharge.  Patient reports that she had sexual intercourse without a condom about a week ago and for the last 2 days she has had perfuse vaginal discharge, dysuria, vaginal burning, and lower abdominal pain.  Patient reports having trichomonas in the past and it feels the same.  She reports that the pain is burning, constant, severe and nonradiating.  No nausea, vomiting.  Past Medical History:  Diagnosis Date  . Bipolar 2 disorder ([place])   . Diabetes mellitus without complication ([place])   . Diverticulitis   . Diverticulosis   . GERD (gastroesophageal reflux disease)   . Sciatica   . Substance abuse ([place])   . Uterine fibroid     Patient Active Problem List   Diagnosis Date Noted  . Special screening for malignant neoplasms, colon 04/06/2018  . Liver hemangioma 03/01/2018  . Uncontrolled type 2 diabetes mellitus with hyperglycemia ([place]) 03/01/2018  . Tobacco use disorder 01/04/2018  . Suicidal ideation 01/04/2018  . Bipolar I disorder, most recent episode depressed, severe without psychotic features ([place]) 01/02/2018  . Bipolar 2 disorder ([place]) 12/25/2017  . Diabetes mellitus without complication ([place]) 96/29/5284  . Severe alcohol use disorder ([place]) 12/25/2017  . Cocaine use disorder, moderate, dependence ([place]) 12/25/2017    Past Surgical History:  Procedure Laterality Date  . BACK SURGERY    . CHOLECYSTECTOMY    . LUMBAR SPINE SURGERY      Prior to Admission medications   Medication Sig Start Date End Date Taking? Authorizing Provider  atorvastatin (LIPITOR) 10  MG tablet Take 1 tablet (10 mg total) by mouth daily. 02/08/18   Soyla Dryer, PA-C  citalopram (CELEXA) 20 MG tablet Take 1 tablet (20 mg total) by mouth daily. 01/11/18   [name], Herma Ard B, MD  cyclobenzaprine (FLEXERIL) 5 MG tablet Take 1 tablet (5 mg total) by mouth 3 (three) times daily as needed for muscle spasms. 03/25/18   Evalee Jefferson, PA-C  gabapentin (NEURONTIN) 300 MG capsule Take 1 capsule (300 mg total) by mouth 3 (three) times daily. 02/08/18   Soyla Dryer, PA-C  hydrOXYzine (ATARAX/VISTARIL) 25 MG tablet Take 1 tablet (25 mg total) by mouth 3 (three) times daily as needed for anxiety. 01/06/18   [name], Herma Ard B, MD  ibuprofen (ADVIL) 600 MG tablet Take 1 tablet (600 mg total) by mouth every 6 (six) hours as needed. 08/13/18   Rudene Re, MD  Insulin Glargine (LANTUS SOLOSTAR) 100 UNIT/ML Solostar Pen Inject 15 Units into the skin at bedtime. 02/08/18   Soyla Dryer, PA-C  metFORMIN (GLUCOPHAGE) 1000 MG tablet Take 1 tablet (1,000 mg total) by mouth 2 (two) times daily with a meal. 01/19/18   Soyla Dryer, PA-C  metroNIDAZOLE (FLAGYL) 500 MG tablet Take 1 tablet (500 mg total) by mouth 2 (two) times daily for 7 days. 08/13/18 08/20/18  Rudene Re, MD  naproxen (NAPROSYN) 500 MG tablet Take 1 tablet (500 mg total) by mouth 2 (two) times daily. 03/25/18   Evalee Jefferson, PA-C  omeprazole (PRILOSEC) 20 MG capsule Take 2 capsules (40 mg total) by mouth daily. 03/01/18   Soyla Dryer, PA-C  traZODone (DESYREL) 150  MG tablet Take 1 tablet (150 mg total) by mouth at bedtime as needed for sleep. 01/11/18   [name], Wardell Honour, MD    Allergies Ivp dye [iodinated diagnostic agents]  Family History  Problem Relation Age of Onset  . CAD Mother   . Diabetes Mother   . Heart disease Mother   . Parkinson's disease Mother   . Kidney disease Mother   . Cancer Father        prostate cancer  . Breast cancer Maternal Aunt   . Breast cancer Maternal Grandmother      Social History Social History   Tobacco Use  . Smoking status: Current Every Day Smoker    Packs/day: 0.25    Years: 40.00    Pack years: 10.00    Types: Cigarettes  . Smokeless tobacco: Never Used  Substance Use Topics  . Alcohol use: Not Currently    Comment: none since 12-25-2017. previous heavy drinker  . Drug use: Not Currently    Types: "Crack" cocaine    Comment: LAST USED NOV. 15, 2019    Review of Systems  Constitutional: Negative for fever. Eyes: Negative for visual changes. ENT: Negative for sore throat. Neck: No neck pain  Cardiovascular: Negative for chest pain. Respiratory: Negative for shortness of breath. Gastrointestinal: + abdominal pain. No vomiting or diarrhea. Genitourinary: + vaginal pain, dysuria. Musculoskeletal: Negative for back pain. Skin: Negative for rash. Neurological: Negative for headaches, weakness or numbness. Psych: No SI or HI  ____________________________________________   PHYSICAL EXAM:  VITAL SIGNS: ED Triage Vitals  Enc Vitals Group     BP 08/12/18 2110 (!) 135/100     Pulse Rate 08/12/18 2110 82     Resp 08/12/18 2110 18     Temp 08/12/18 2110 99.8 F (37.7 C)     Temp Source 08/12/18 2110 Oral     SpO2 08/12/18 2110 99 %     Weight --      Height --      Head Circumference --      Peak Flow --      Pain Score 08/12/18 2111 8     Pain Loc --      Pain Edu? --      Excl. in [place]? --     Constitutional: Alert and oriented. Well appearing and in no apparent distress. HEENT:      Head: Normocephalic and atraumatic.         Eyes: Conjunctivae are normal. Sclera is non-icteric.       Mouth/Throat: Mucous membranes are moist.       Neck: Supple with no signs of meningismus. Cardiovascular: Regular rate and rhythm. No murmurs, gallops, or rubs. 2+ symmetrical distal pulses are present in all extremities. No JVD. Respiratory: Normal respiratory effort. Lungs are clear to auscultation bilaterally. No wheezes,  crackles, or rhonchi.  Gastrointestinal: Soft, tender to palpation on the right lower quadrant and suprapubic region, and non distended with positive bowel sounds. No rebound or guarding. Genitourinary: No CVA tenderness. Pelvic exam: Normal external genitalia, no rashes or lesions.  Patient has large amount of yellowish-green watery discharge with erythematous vaginal mucosa and significant tenderness. Os closed. No cervical motion tenderness.  No uterine or adnexal tenderness.   Musculoskeletal: Nontender with normal range of motion in all extremities. No edema, cyanosis, or erythema of extremities. Neurologic: Normal speech and language. Face is symmetric. Moving all extremities. No gross focal neurologic deficits are appreciated. Skin: Skin is warm, dry and intact.  No rash noted. Psychiatric: Mood and affect are normal. Speech and behavior are normal.  ____________________________________________   LABS (all labs ordered are listed, but only abnormal results are displayed)  Labs Reviewed  WET PREP, GENITAL - Abnormal; Notable for the following components:      Result Value   Trich, Wet Prep PRESENT (*)    WBC, Wet Prep HPF POC MANY (*)    All other components within normal limits  COMPREHENSIVE METABOLIC PANEL - Abnormal; Notable for the following components:   Glucose, Bld 239 (*)    All other components within normal limits  CBC - Abnormal; Notable for the following components:   WBC 12.7 (*)    All other components within normal limits  URINALYSIS, COMPLETE (UACMP) WITH MICROSCOPIC - Abnormal; Notable for the following components:   Color, Urine YELLOW (*)    APPearance CLOUDY (*)    Glucose, UA 50 (*)    Hgb urine dipstick SMALL (*)    Leukocytes,Ua LARGE (*)    RBC / HPF >50 (*)    WBC, UA >50 (*)    All other components within normal limits  CHLAMYDIA/NGC RT PCR ([facility] ONLY)   ____________________________________________  EKG  none   ____________________________________________  RADIOLOGY  I have personally reviewed the images performed during this visit and I agree with the Radiologist's read.   Interpretation by Radiologist:  US Pelvis Transvaginal Non-ob (tv Only)  Result Date: 08/13/2018 CLINICAL DATA:  Lower abdominal pain EXAM: TRANSABDOMINAL AND TRANSVAGINAL ULTRASOUND OF PELVIS TECHNIQUE: Both transabdominal and transvaginal ultrasound examinations of the pelvis were performed. Transabdominal technique was performed for global imaging of the pelvis including uterus, ovaries, adnexal regions, and pelvic cul-de-sac. It was necessary to proceed with endovaginal exam following the transabdominal exam to visualize the uterus, endometrium, ovaries and adnexa. COMPARISON:  CT 01/31/2018.  Ultrasound 11/25/2017. FINDINGS: Uterus Measurements: 6.3 x 3.6 x 6.3 cm = volume: 75 mL. Multiple uterine fibroids, the largest an exophytic fundal fibroid measuring 3.9 cm. Endometrium Thickness: 3 mm in thickness.  No focal abnormality visualized. Right ovary Measurements: 3.0 x 1.6 x 1.9 cm = volume: 4.7 mL. Normal appearance/no adnexal mass. Left ovary Measurements: 2.9 x 1.4 x 2.1 cm = volume: 4.4 mL. Normal appearance/no adnexal mass. Other findings No abnormal free fluid. IMPRESSION: Fibroid uterus. No acute findings. Electronically Signed   By: Rolm Baptise M.D.   On: 08/13/2018 03:03   US Pelvis (transabdominal Only)  Result Date: 08/13/2018 CLINICAL DATA:  Lower abdominal pain EXAM: TRANSABDOMINAL AND TRANSVAGINAL ULTRASOUND OF PELVIS TECHNIQUE: Both transabdominal and transvaginal ultrasound examinations of the pelvis were performed. Transabdominal technique was performed for global imaging of the pelvis including uterus, ovaries, adnexal regions, and pelvic cul-de-sac. It was necessary to proceed with endovaginal exam following the transabdominal exam to visualize the uterus, endometrium, ovaries and adnexa. COMPARISON:  CT  01/31/2018.  Ultrasound 11/25/2017. FINDINGS: Uterus Measurements: 6.3 x 3.6 x 6.3 cm = volume: 75 mL. Multiple uterine fibroids, the largest an exophytic fundal fibroid measuring 3.9 cm. Endometrium Thickness: 3 mm in thickness.  No focal abnormality visualized. Right ovary Measurements: 3.0 x 1.6 x 1.9 cm = volume: 4.7 mL. Normal appearance/no adnexal mass. Left ovary Measurements: 2.9 x 1.4 x 2.1 cm = volume: 4.4 mL. Normal appearance/no adnexal mass. Other findings No abnormal free fluid. IMPRESSION: Fibroid uterus. No acute findings. Electronically Signed   By: Rolm Baptise M.D.   On: 08/13/2018 03:03      ____________________________________________   PROCEDURES  Procedure(s) performed: None Procedures Critical Care performed:  None ____________________________________________   INITIAL IMPRESSION / ASSESSMENT AND PLAN / ED COURSE  53 y.o. female with history as listed below who presents for evaluation of abdominal pain and vaginal discharge.  Exam consistent with trach which was positive on wet prep.  Due to tenderness to palpation on the suprapubic and right lower quadrant patient was sent for a transvaginal ultrasound to rule out a tubo-ovarian abscess.  Ultrasound showed no evidence of TOA. Patient started on flagyl for trichomonas. Gc/ chlamydia negative.  Safe sex discussed with patient.  She was started on Flagyl.  Discussed my standard return precautions and close follow-up with OB/GYN.      As part of my medical decision making, I reviewed the following data within the [place] notes reviewed and incorporated, Labs reviewed , Old chart reviewed, Radiograph reviewed , Notes from prior ED visits and [place] Controlled Substance Database    Pertinent labs & imaging results that were available during my care of the patient were reviewed by me and considered in my medical decision making (see chart for details).     ____________________________________________   FINAL CLINICAL IMPRESSION(S) / ED DIAGNOSES  Final diagnoses:  Abdominal pain  Trichomonas vaginalis (TV) infection      NEW MEDICATIONS STARTED DURING THIS VISIT:  ED Discharge Orders         Ordered    metroNIDAZOLE (FLAGYL) 500 MG tablet  2 times daily     08/13/18 0307    ibuprofen (ADVIL) 600 MG tablet  Every 6 hours PRN     08/13/18 0307           Note:  This document was prepared using Dragon voice recognition software and may include unintentional dictation errors.    Alfred Levins, [place], MD 08/13/18 [phone]

## 2018-08-13 NOTE — ED Notes (Signed)
Pt given a [place] sandwich and juice to go

## 2018-08-13 NOTE — ED Notes (Signed)
Pt to US.

## 2018-09-02 ENCOUNTER — Other Ambulatory Visit: Payer: Self-pay

## 2018-09-02 ENCOUNTER — Inpatient Hospital Stay
Admission: RE | Admit: 2018-09-02 | Discharge: 2018-09-06 | DRG: 885 | Disposition: A | Discharge status: Home / Self Care | Payer: No Typology Code available for payment source | Source: Intra-hospital | Attending: Psychiatry | Admitting: Psychiatry

## 2018-09-02 ENCOUNTER — Emergency Department
Admission: EM | Admit: 2018-09-02 | Discharge: 2018-09-02 | Disposition: A | Discharge status: Other Acute Inpatient Hospital | Payer: No Typology Code available for payment source | Attending: Emergency Medicine | Admitting: Emergency Medicine

## 2018-09-02 ENCOUNTER — Encounter: Payer: Self-pay | Admitting: Emergency Medicine

## 2018-09-02 DIAGNOSIS — R45851 Suicidal ideations: Secondary | ICD-10-CM | POA: Diagnosis present

## 2018-09-02 DIAGNOSIS — F332 Major depressive disorder, recurrent severe without psychotic features: Secondary | ICD-10-CM | POA: Diagnosis not present

## 2018-09-02 DIAGNOSIS — F3181 Bipolar II disorder: Principal | ICD-10-CM | POA: Diagnosis present

## 2018-09-02 DIAGNOSIS — F17210 Nicotine dependence, cigarettes, uncomplicated: Secondary | ICD-10-CM | POA: Diagnosis present

## 2018-09-02 DIAGNOSIS — G4700 Insomnia, unspecified: Secondary | ICD-10-CM | POA: Diagnosis present

## 2018-09-02 DIAGNOSIS — Z03818 Encounter for observation for suspected exposure to other biological agents ruled out: Secondary | ICD-10-CM | POA: Insufficient documentation

## 2018-09-02 DIAGNOSIS — K219 Gastro-esophageal reflux disease without esophagitis: Secondary | ICD-10-CM | POA: Diagnosis present

## 2018-09-02 DIAGNOSIS — F3130 Bipolar disorder, current episode depressed, mild or moderate severity, unspecified: Secondary | ICD-10-CM | POA: Diagnosis present

## 2018-09-02 DIAGNOSIS — F339 Major depressive disorder, recurrent, unspecified: Secondary | ICD-10-CM | POA: Diagnosis present

## 2018-09-02 DIAGNOSIS — Z794 Long term (current) use of insulin: Secondary | ICD-10-CM | POA: Insufficient documentation

## 2018-09-02 DIAGNOSIS — E119 Type 2 diabetes mellitus without complications: Secondary | ICD-10-CM

## 2018-09-02 DIAGNOSIS — F1420 Cocaine dependence, uncomplicated: Secondary | ICD-10-CM | POA: Diagnosis present

## 2018-09-02 DIAGNOSIS — Z79899 Other long term (current) drug therapy: Secondary | ICD-10-CM | POA: Insufficient documentation

## 2018-09-02 LAB — URINE DRUG SCREEN, QUALITATIVE (ARMC ONLY)
Amphetamines, Ur Screen: NOT DETECTED
Barbiturates, Ur Screen: NOT DETECTED
Benzodiazepine, Ur Scrn: NOT DETECTED
Cannabinoid 50 Ng, Ur ~~LOC~~: NOT DETECTED
Cocaine Metabolite,Ur ~~LOC~~: POSITIVE — AB
MDMA (Ecstasy)Ur Screen: NOT DETECTED
Methadone Scn, Ur: NOT DETECTED
Opiate, Ur Screen: NOT DETECTED
Phencyclidine (PCP) Ur S: NOT DETECTED
Tricyclic, Ur Screen: NOT DETECTED

## 2018-09-02 LAB — COMPREHENSIVE METABOLIC PANEL
ALT: 21 U/L (ref 0–44)
AST: 16 U/L (ref 15–41)
Albumin: 4 g/dL (ref 3.5–5.0)
Alkaline Phosphatase: 92 U/L (ref 38–126)
Anion gap: 11 (ref 5–15)
BUN: 8 mg/dL (ref 6–20)
CO2: 20 mmol/L — ABNORMAL LOW (ref 22–32)
Calcium: 8.9 mg/dL (ref 8.9–10.3)
Chloride: 108 mmol/L (ref 98–111)
Creatinine, Ser: 0.59 mg/dL (ref 0.44–1.00)
GFR calc Af Amer: >60 mL/min (ref >60)
GFR calc non Af Amer: >60 mL/min (ref >60)
Glucose, Bld: 213 mg/dL — ABNORMAL HIGH (ref 70–99)
Potassium: 3.6 mmol/L (ref 3.5–5.1)
Sodium: 139 mmol/L (ref 135–145)
Total Bilirubin: 1 mg/dL (ref 0.3–1.2)
Total Protein: 7.6 g/dL (ref 6.5–8.1)

## 2018-09-02 LAB — ETHANOL: Alcohol, Ethyl (B): 113 mg/dL — ABNORMAL HIGH (ref <10)

## 2018-09-02 LAB — ACETAMINOPHEN LEVEL: Acetaminophen (Tylenol), Serum: <10 ug/mL — ABNORMAL LOW (ref 10–30)

## 2018-09-02 LAB — GLUCOSE, CAPILLARY: Glucose-Capillary: 139 mg/dL — ABNORMAL HIGH (ref 70–99)

## 2018-09-02 LAB — CBC
HCT: 40.4 % (ref 36.0–46.0)
Hemoglobin: 13.6 g/dL (ref 12.0–15.0)
MCH: 30 pg (ref 26.0–34.0)
MCHC: 33.7 g/dL (ref 30.0–36.0)
MCV: 89.2 fL (ref 80.0–100.0)
Platelets: 298 10*3/uL (ref 150–400)
RBC: 4.53 MIL/uL (ref 3.87–5.11)
RDW: 12.9 % (ref 11.5–15.5)
WBC: 11.5 10*3/uL — ABNORMAL HIGH (ref 4.0–10.5)
nRBC: 0 % (ref 0.0–0.2)

## 2018-09-02 LAB — SALICYLATE LEVEL: Salicylate Lvl: <7 mg/dL (ref 2.8–30.0)

## 2018-09-02 LAB — SARS CORONAVIRUS 2 BY RT PCR (HOSPITAL ORDER, PERFORMED IN ~~LOC~~ HOSPITAL LAB): SARS Coronavirus 2: NEGATIVE

## 2018-09-02 MED ORDER — METFORMIN HCL 500 MG PO TABS
1000.0000 mg | ORAL_TABLET | Freq: Two times a day (BID) | ORAL | Status: DC
  Administered 2018-09-02: 1000 mg via ORAL
  Filled 2018-09-02: qty 2

## 2018-09-02 MED ORDER — INSULIN GLARGINE 100 UNIT/ML ~~LOC~~ SOLN
15.0000 [IU] | Freq: Every day | SUBCUTANEOUS | Status: DC
  Administered 2018-09-02: 22:00:00 15 [IU] via SUBCUTANEOUS
  Filled 2018-09-02: qty 0.15

## 2018-09-02 MED ORDER — LORAZEPAM 1 MG PO TABS
1.0000 mg | ORAL_TABLET | Freq: Once | ORAL | Status: AC
  Administered 2018-09-02: 19:00:00 1 mg via ORAL
  Filled 2018-09-02: qty 1

## 2018-09-02 MED ORDER — ATORVASTATIN CALCIUM 20 MG PO TABS
10.0000 mg | ORAL_TABLET | Freq: Every day | ORAL | Status: DC
  Administered 2018-09-02: 10 mg via ORAL
  Filled 2018-09-02: qty 1

## 2018-09-02 MED ORDER — ACETAMINOPHEN 325 MG PO TABS
650.0000 mg | ORAL_TABLET | Freq: Three times a day (TID) | ORAL | Status: DC | PRN
  Filled 2018-09-02: qty 2

## 2018-09-02 MED ORDER — ONDANSETRON 4 MG PO TBDP
4.0000 mg | ORAL_TABLET | Freq: Once | ORAL | Status: AC
  Administered 2018-09-02: 4 mg via ORAL
  Filled 2018-09-02: qty 1

## 2018-09-02 MED ORDER — LORAZEPAM 0.5 MG PO TABS
0.5000 mg | ORAL_TABLET | Freq: Once | ORAL | Status: AC
  Administered 2018-09-02: 13:00:00 0.5 mg via ORAL
  Filled 2018-09-02: qty 1

## 2018-09-02 NOTE — ED Provider Notes (Signed)
[facility] Emergency Department Provider Note  ____________________________________________   First MD Initiated Contact with Patient 09/02/18 1039     (approximate)  I have reviewed the triage vital signs and the nursing notes.   HISTORY  Chief Complaint Psychiatric Evaluation    HPI Julie Lambert is a 53 y.o. female with diabetes, bipolar who presents with psychiatric evaluation.  Patient reports that she does not want to live.  Patient does have a history of diabetes and has not been eating or drinking for the last day.  Patient also endorses using alcohol and crack last night.  Patient says that she is having SI but does not have a plan.  She is currently homeless.  She denies any HI, auditory visual hallucinations.  She says she has not slept in 2 days.  Her SI is severe, constant, nothing makes it better, nothing makes it worse.  She denies any chest pain, shortness of breath, fevers, urinary symptoms.          Past Medical History:  Diagnosis Date   Bipolar 2 disorder ([place])    Diabetes mellitus without complication ([place])    Diverticulitis    Diverticulosis    GERD (gastroesophageal reflux disease)    Sciatica    Substance abuse ([place])    Uterine fibroid     Patient Active Problem List   Diagnosis Date Noted   Special screening for malignant neoplasms, colon 04/06/2018   Liver hemangioma 03/01/2018   Uncontrolled type 2 diabetes mellitus with hyperglycemia ([place]) 03/01/2018   Tobacco use disorder 01/04/2018   Suicidal ideation 01/04/2018   Bipolar I disorder, most recent episode depressed, severe without psychotic features ([place]) 01/02/2018   Bipolar 2 disorder ([place]) 12/25/2017   Diabetes mellitus without complication ([place]) 17/00/1749   Severe alcohol use disorder ([place]) 12/25/2017   Cocaine use disorder, moderate, dependence ([place]) 12/25/2017    Past Surgical History:  Procedure Laterality Date   BACK SURGERY      CHOLECYSTECTOMY     LUMBAR SPINE SURGERY      Prior to Admission medications   Medication Sig Start Date End Date Taking? Authorizing Provider  atorvastatin (LIPITOR) 10 MG tablet Take 1 tablet (10 mg total) by mouth daily. 02/08/18   Soyla Dryer, PA-C  citalopram (CELEXA) 20 MG tablet Take 1 tablet (20 mg total) by mouth daily. 01/11/18   [name], Herma Ard B, MD  cyclobenzaprine (FLEXERIL) 5 MG tablet Take 1 tablet (5 mg total) by mouth 3 (three) times daily as needed for muscle spasms. 03/25/18   Evalee Jefferson, PA-C  gabapentin (NEURONTIN) 300 MG capsule Take 1 capsule (300 mg total) by mouth 3 (three) times daily. 02/08/18   Soyla Dryer, PA-C  hydrOXYzine (ATARAX/VISTARIL) 25 MG tablet Take 1 tablet (25 mg total) by mouth 3 (three) times daily as needed for anxiety. 01/06/18   [name], Herma Ard B, MD  ibuprofen (ADVIL) 600 MG tablet Take 1 tablet (600 mg total) by mouth every 6 (six) hours as needed. 08/13/18   Rudene Re, MD  Insulin Glargine (LANTUS SOLOSTAR) 100 UNIT/ML Solostar Pen Inject 15 Units into the skin at bedtime. 02/08/18   Soyla Dryer, PA-C  metFORMIN (GLUCOPHAGE) 1000 MG tablet Take 1 tablet (1,000 mg total) by mouth 2 (two) times daily with a meal. 01/19/18   Soyla Dryer, PA-C  naproxen (NAPROSYN) 500 MG tablet Take 1 tablet (500 mg total) by mouth 2 (two) times daily. 03/25/18   Evalee Jefferson, PA-C  omeprazole (PRILOSEC) 20 MG  capsule Take 2 capsules (40 mg total) by mouth daily. 03/01/18   Soyla Dryer, PA-C  traZODone (DESYREL) 150 MG tablet Take 1 tablet (150 mg total) by mouth at bedtime as needed for sleep. 01/11/18   [name], Wardell Honour, MD    Allergies Ivp dye [iodinated diagnostic agents]  Family History  Problem Relation Age of Onset   CAD Mother    Diabetes Mother    Heart disease Mother    Parkinson's disease Mother    Kidney disease Mother    Cancer Father        prostate cancer   Breast cancer Maternal Aunt     Breast cancer Maternal Grandmother     Social History Social History   Tobacco Use   Smoking status: Current Every Day Smoker    Packs/day: 0.25    Years: 40.00    Pack years: 10.00    Types: Cigarettes   Smokeless tobacco: Never Used  Substance Use Topics   Alcohol use: Not Currently    Comment: last use 7/22 3 40oz    Drug use: Not Currently    Types: "Crack" cocaine    Comment: last use 7/22, smoked crack      Review of Systems Constitutional: No fever/chills Eyes: No visual changes. ENT: No sore throat. Cardiovascular: Denies chest pain. Respiratory: Denies shortness of breath. Gastrointestinal: No abdominal pain.  No nausea, no vomiting.  No diarrhea.  No constipation.  Decreased eating Genitourinary: Negative for dysuria. Musculoskeletal: Negative for back pain. Skin: Negative for rash. Neurological: Negative for headaches, focal weakness or numbness. Psych: SI All other ROS negative ____________________________________________   PHYSICAL EXAM:  VITAL SIGNS: ED Triage Vitals [09/02/18 1002]  Enc Vitals Group     BP (!) 149/88     Pulse Rate (!) 106     Resp 20     Temp 98.5 F (36.9 C)     Temp Source Oral     SpO2 94 %     Weight 180 lb (81.6 kg)     Height 5\' 7"  (1.702 m)     Head Circumference      Peak Flow      Pain Score 0     Pain Loc      Pain Edu?      Excl. in [place]?     Constitutional: Alert and oriented. Well appearing and in no acute distress. Eyes: Conjunctivae are normal. EOMI. Head: Atraumatic. Nose: No congestion/rhinnorhea. Mouth/Throat: Mucous membranes are moist.   Neck: No stridor. Trachea Midline. FROM Cardiovascular: Normal rate, regular rhythm. Grossly normal heart sounds.  Good peripheral circulation. Respiratory: Normal respiratory effort.  No retractions. Lungs CTAB. Gastrointestinal: Soft and nontender. No distention. No abdominal bruits.  Musculoskeletal: No lower extremity tenderness nor edema.  No joint  effusions. Neurologic:  Normal speech and language. No gross focal neurologic deficits are appreciated.  Skin:  Skin is warm, dry and intact. No rash noted. Psychiatric: Tearful, endorsing SI GU: Deferred   ____________________________________________   LABS (all labs ordered are listed, but only abnormal results are displayed)  Labs Reviewed  COMPREHENSIVE METABOLIC PANEL - Abnormal; Notable for the following components:      Result Value   CO2 20 (*)    Glucose, Bld 213 (*)    All other components within normal limits  ETHANOL - Abnormal; Notable for the following components:   Alcohol, Ethyl (B) 113 (*)    All other components within normal limits  ACETAMINOPHEN LEVEL - Abnormal;  Notable for the following components:   Acetaminophen (Tylenol), Serum <10 (*)    All other components within normal limits  CBC - Abnormal; Notable for the following components:   WBC 11.5 (*)    All other components within normal limits  URINE DRUG SCREEN, QUALITATIVE ([facility] ONLY) - Abnormal; Notable for the following components:   Cocaine Metabolite,Ur [place] POSITIVE (*)    All other components within normal limits  SALICYLATE LEVEL  POC URINE PREG, ED  CBG MONITORING, ED  CBG MONITORING, ED  CBG MONITORING, ED   ____________________________________________  PROCEDURES  Procedure(s) performed (including Critical Care):  Procedures   ____________________________________________   INITIAL IMPRESSION / ASSESSMENT AND PLAN / ED COURSE  Julie Lambert was evaluated in Emergency Department on 09/02/2018 for the symptoms described in the history of present illness. She was evaluated in the context of the global COVID-19 pandemic, which necessitated consideration that the patient might be at risk for infection with the SARS-CoV-2 virus that causes COVID-19. Institutional protocols and algorithms that pertain to the evaluation of patients at risk for COVID-19 are in a state of rapid change based  on information released by regulatory bodies including the CDC and federal and state organizations. These policies and algorithms were followed during the patient's care in the ED.    Pt is without any acute medical complaints. No exam findings to suggest medical cause of current presentation. Will order psychiatric screening labs and discuss further w/ psychiatric service.  D/d includes but is not limited to psychiatric disease, behavioral/personality disorder, inadequate socioeconomic support, medical.  Based on HPI, exam, unremarkable labs, no concern for acute medical problem at this time. No rigidity, clonus, hyperthermia, focal neurologic deficit, diaphoresis, tachycardia, meningismus, ataxia, gait abnormality or other finding to suggest this visit represents a non-psychiatric problem. Screening labs reviewed.    Given this, pt medically cleared, to be dispositioned per Psych.    Glucose elevated at 213.  Ethanol slightly elevated at 113.  Will restart patient's metformin and insulin.  No evidence of DKA.       ____________________________________________   FINAL CLINICAL IMPRESSION(S) / ED DIAGNOSES   Final diagnoses:  Suicidal ideation      MEDICATIONS GIVEN DURING THIS VISIT:  Medications  atorvastatin (LIPITOR) tablet 10 mg (has no administration in time range)  Insulin Glargine (LANTUS) Solostar Pen 15 Units (has no administration in time range)  metFORMIN (GLUCOPHAGE) tablet 1,000 mg (has no administration in time range)     ED Discharge Orders    None       Note:  This document was prepared using Dragon voice recognition software and may include unintentional dictation errors.   Vanessa [place], MD 09/02/18 1125

## 2018-09-02 NOTE — ED Notes (Signed)
1 gray ring with clear stones, 1 pair gray earrings with clear stones, 1 pair black slip on flats, 1 pair black pants, 1 black t shirt, 1 bra.   Pt changed out by this RN and female Advice worker, BPD.

## 2018-09-02 NOTE — Consult Note (Signed)
[facility] Consult   Reason for Consult:  Suicidal ideations Referring Physician:  EDP Patient Identification: Julie Lambert MRN:  [id number] Principal Diagnosis: Bipolar 2 disorder ([place]) Diagnosis:  Principal Problem:   Bipolar 2 disorder ([place]) Active Problems:   Cocaine use disorder, moderate, dependence ([place])   Total Time spent with patient: 30 minutes  Subjective:   Julie Lambert is a 53 y.o. female patient reports that she came in because she was feeling suicidal and felt like she was going to kill herself.  She reports that she has a history of bipolar disorder as well as anxiety and states that she is only taking Celexa.  She reports that she was previously admitted in November 2019 and was admitted at [facility].  Patient states that her stress level has been severely elevated recently.  She states that her mother was in a nursing facility and had a stroke last Friday and now she is at a another facility.  She states that she has been living out of a hotel room and she is probably going to lose that place as well.  She states that she is having car and financial troubles as well.  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  She stated the stress got to her and she relapsed on crack cocaine yesterday.  She reports that she feels that her mood has been all over the place here recently and that she needs to have some additional help.  She reports that she is concerned because her insurance is not supposed to kick in until August.  When patient was asked if she was currently suicidal patient started crying and becoming very anxious.  HPI:   53 y.o. female with diabetes, bipolar who presents with psychiatric evaluation.  Patient reports that she does not want to live.  Patient does have a history of diabetes and has not been eating or drinking for the last day.  Patient also endorses using alcohol and crack last night.  Patient says that she is having  SI but does not have a plan.  She is currently homeless.  She denies any HI, auditory visual hallucinations.  She says she has not slept in 2 days.  Her SI is severe, constant, nothing makes it better, nothing makes it worse.  She denies any chest pain, shortness of breath, fevers, urinary symptoms.  Patient is seen face-to-face by this provider.  I have consulted with Dr. Mallie Darting.  Patient is continue to endorse suicidal ideations but denies any homicidal ideations and denies any hallucinations.  Due to patient's stressors, history, and current complaints feel that patient meets criteria for inpatient placement.  I have notified Dr. Nickolas Madrid of the recommendations.  We will seek [facility] at [facility].  Past Psychiatric History: Bipolar 2 disorder, bipolar 1 disorder, cocaine dependence, alcohol use disorder  Risk to Self:   Risk to Others:   Prior Inpatient Therapy:   Prior Outpatient Therapy:    Past Medical History:  Past Medical History:  Diagnosis Date  . Bipolar 2 disorder ([place])   . Diabetes mellitus without complication ([place])   . Diverticulitis   . Diverticulosis   . GERD (gastroesophageal reflux disease)   . Sciatica   . Substance abuse ([place])   . Uterine fibroid     Past Surgical History:  Procedure Laterality Date  . BACK SURGERY    . CHOLECYSTECTOMY    . LUMBAR SPINE SURGERY     Family History:  Family History  Problem Relation Age of Onset  . CAD Mother   . Diabetes Mother   . Heart disease Mother   . Parkinson's disease Mother   . Kidney disease Mother   . Cancer Father        prostate cancer  . Breast cancer Maternal Aunt   . Breast cancer Maternal Grandmother    Family Psychiatric  History: See above Social History:  Social History   Substance and Sexual Activity  Alcohol Use Not Currently   Comment: last use 7/22 3 40oz      Social History   Substance and Sexual Activity  Drug Use Not Currently  . Types: "Crack" cocaine   Comment: last use 7/22, smoked  crack    Social History   Socioeconomic History  . Marital status: Divorced    Spouse name: Not on file  . Number of children: Not on file  . Years of education: Not on file  . Highest education level: Not on file  Occupational History  . Not on file  Social Needs  . Financial resource strain: Not on file  . Food insecurity    Worry: Not on file    Inability: Not on file  . Transportation needs    Medical: Not on file    Non-medical: Not on file  Tobacco Use  . Smoking status: Current Every Day Smoker    Packs/day: 0.25    Years: 40.00    Pack years: 10.00    Types: Cigarettes  . Smokeless tobacco: Never Used  Substance and Sexual Activity  . Alcohol use: Not Currently    Comment: last use 7/22 3 40oz   . Drug use: Not Currently    Types: "Crack" cocaine    Comment: last use 7/22, smoked crack  . Sexual activity: Yes  Lifestyle  . Physical activity    Days per week: Not on file    Minutes per session: Not on file  . Stress: Not on file  Relationships  . Social Herbalist on phone: Not on file    Gets together: Not on file    Attends religious service: Not on file    Active member of club or organization: Not on file    Attends meetings of clubs or organizations: Not on file    Relationship status: Not on file  Other Topics Concern  . Not on file  Social History Narrative  . Not on file   Additional Social History:    Allergies:   Allergies  Allergen Reactions  . Ivp Dye [Iodinated Diagnostic Agents] Itching    Patient stated she had itching on her back after IV injection    Labs:  Results for orders placed or performed during the hospital encounter of 09/02/18 (from the past 48 hour(s))  Comprehensive metabolic panel     Status: Abnormal   Collection Time: 09/02/18 10:07 AM  Result Value Ref Range   Sodium 139 135 - 145 mmol/L   Potassium 3.6 3.5 - 5.1 mmol/L   Chloride 108 98 - 111 mmol/L   CO2 20 (L) 22 - 32 mmol/L   Glucose, Bld 213  (H) 70 - 99 mg/dL   BUN 8 6 - 20 mg/dL   Creatinine, Ser 0.59 0.44 - 1.00 mg/dL   Calcium 8.9 8.9 - 10.3 mg/dL   Total Protein 7.6 6.5 - 8.1 g/dL   Albumin 4.0 3.5 - 5.0 g/dL   AST 16 15 - 41 U/L  ALT 21 0 - 44 U/L   Alkaline Phosphatase 92 38 - 126 U/L   Total Bilirubin 1.0 0.3 - 1.2 mg/dL   GFR calc non Af Amer >60 >60 mL/min   GFR calc Af Amer >60 >60 mL/min   Anion gap 11 5 - 15    Comment: Performed at [facility], [address] [place], [place] [address]  Ethanol     Status: Abnormal   Collection Time: 09/02/18 10:07 AM  Result Value Ref Range   Alcohol, Ethyl (B) 113 (H) <10 mg/dL    Comment: (NOTE) Lowest detectable limit for serum alcohol is 10 mg/dL. For medical purposes only. Performed at [facility], [address] [place], [place] [address]   Salicylate level     Status: None   Collection Time: 09/02/18 10:07 AM  Result Value Ref Range   Salicylate Lvl <7.3 2.8 - 30.0 mg/dL    Comment: Performed at [facility], [place], [place] [address]  Acetaminophen level     Status: Abnormal   Collection Time: 09/02/18 10:07 AM  Result Value Ref Range   Acetaminophen (Tylenol), Serum <10 (L) 10 - 30 ug/mL    Comment: (NOTE) Therapeutic concentrations vary significantly. A range of 10-30 ug/mL  may be an effective concentration for many patients. However, some  are best treated at concentrations outside of this range. Acetaminophen concentrations >150 ug/mL at 4 hours after ingestion  and >50 ug/mL at 12 hours after ingestion are often associated with  toxic reactions. Performed at [facility], [address] [place], [place] [address]   cbc     Status: Abnormal   Collection Time: 09/02/18 10:07 AM  Result Value Ref Range   WBC 11.5 (H) 4.0 - 10.5 K/uL   RBC 4.53 3.87 - 5.11 MIL/uL   Hemoglobin 13.6 12.0 - 15.0 g/dL   HCT 40.4 36.0 - 46.0 %   MCV 89.2 80.0 - 100.0 fL   MCH 30.0 26.0 - 34.0 pg   MCHC  33.7 30.0 - 36.0 g/dL   RDW 12.9 11.5 - 15.5 %   Platelets 298 150 - 400 K/uL   nRBC 0.0 0.0 - 0.2 %    Comment: Performed at [facility], [address] [place], [place] [address]  Urine Drug Screen, Qualitative     Status: Abnormal   Collection Time: 09/02/18 10:23 AM  Result Value Ref Range   Tricyclic, Ur Screen NONE DETECTED NONE DETECTED   Amphetamines, Ur Screen NONE DETECTED NONE DETECTED   MDMA (Ecstasy)Ur Screen NONE DETECTED NONE DETECTED   Cocaine Metabolite,Ur [place] POSITIVE (A) NONE DETECTED   Opiate, Ur Screen NONE DETECTED NONE DETECTED   Phencyclidine (PCP) Ur S NONE DETECTED NONE DETECTED   Cannabinoid 50 Ng, Ur  NONE DETECTED NONE DETECTED   Barbiturates, Ur Screen NONE DETECTED NONE DETECTED   Benzodiazepine, Ur Scrn NONE DETECTED NONE DETECTED   Methadone Scn, Ur NONE DETECTED NONE DETECTED    Comment: (NOTE) Tricyclics + metabolites, urine    Cutoff 1000 ng/mL Amphetamines + metabolites, urine  Cutoff 1000 ng/mL MDMA (Ecstasy), urine              Cutoff 500 ng/mL Cocaine Metabolite, urine          Cutoff 300 ng/mL Opiate + metabolites, urine        Cutoff 300 ng/mL Phencyclidine (PCP), urine         Cutoff 25 ng/mL Cannabinoid, urine  Cutoff 50 ng/mL Barbiturates + metabolites, urine  Cutoff 200 ng/mL Benzodiazepine, urine              Cutoff 200 ng/mL Methadone, urine                   Cutoff 300 ng/mL The urine drug screen provides only a preliminary, unconfirmed analytical test result and should not be used for non-medical purposes. Clinical consideration and professional judgment should be applied to any positive drug screen result due to possible interfering substances. A more specific alternate chemical method must be used in order to obtain a confirmed analytical result. Gas chromatography / mass spectrometry (GC/MS) is the preferred confirmat ory method. Performed at [facility], [address]  [place], [place] [address]     Current Facility-Administered Medications  Medication Dose Route Frequency Provider Last Rate Last Dose  . atorvastatin (LIPITOR) tablet 10 mg  10 mg Oral Daily Vanessa Elko, MD   10 mg at 09/02/18 1315  . insulin glargine (LANTUS) injection 15 Units  15 Units Subcutaneous QHS Vanessa Danielson, MD      . metFORMIN (GLUCOPHAGE) tablet 1,000 mg  1,000 mg Oral BID WC Vanessa [place], MD       Current Outpatient Medications  Medication Sig Dispense Refill  . atorvastatin (LIPITOR) 10 MG tablet Take 1 tablet (10 mg total) by mouth daily. 90 tablet 1  . citalopram (CELEXA) 20 MG tablet Take 1 tablet (20 mg total) by mouth daily. 30 tablet 1  . cyclobenzaprine (FLEXERIL) 5 MG tablet Take 1 tablet (5 mg total) by mouth 3 (three) times daily as needed for muscle spasms. 15 tablet 0  . gabapentin (NEURONTIN) 300 MG capsule Take 1 capsule (300 mg total) by mouth 3 (three) times daily. 90 capsule 1  . hydrOXYzine (ATARAX/VISTARIL) 25 MG tablet Take 1 tablet (25 mg total) by mouth 3 (three) times daily as needed for anxiety. 90 tablet 1  . ibuprofen (ADVIL) 600 MG tablet Take 1 tablet (600 mg total) by mouth every 6 (six) hours as needed. 20 tablet 0  . Insulin Glargine (LANTUS SOLOSTAR) 100 UNIT/ML Solostar Pen Inject 15 Units into the skin at bedtime. 5 pen PRN  . metFORMIN (GLUCOPHAGE) 1000 MG tablet Take 1 tablet (1,000 mg total) by mouth 2 (two) times daily with a meal. 180 tablet 1  . naproxen (NAPROSYN) 500 MG tablet Take 1 tablet (500 mg total) by mouth 2 (two) times daily. 20 tablet 0  . omeprazole (PRILOSEC) 20 MG capsule Take 2 capsules (40 mg total) by mouth daily. 90 capsule 1  . traZODone (DESYREL) 150 MG tablet Take 1 tablet (150 mg total) by mouth at bedtime as needed for sleep. 30 tablet 1    Musculoskeletal: Strength & Muscle Tone: within normal limits Gait & Station: normal Patient leans: N/A  Psychiatric Specialty Exam: Physical Exam  Nursing note and  vitals reviewed. Constitutional: She is oriented to person, place, and time. She appears well-developed and well-nourished.  Respiratory: Effort normal.  Musculoskeletal: Normal range of motion.  Neurological: She is alert and oriented to person, place, and time.  Skin: Skin is warm.    Review of Systems  Constitutional: Negative.   HENT: Negative.   Eyes: Negative.   Respiratory: Negative.   Cardiovascular: Negative.   Gastrointestinal: Negative.   Genitourinary: Negative.   Musculoskeletal: Negative.   Skin: Negative.   Neurological: Negative.   Endo/Heme/Allergies: Negative.   Psychiatric/Behavioral: Positive for depression,  substance abuse and suicidal ideas. The patient is nervous/anxious and has insomnia.     Blood pressure (!) 149/88, pulse (!) 106, temperature 98.5 F (36.9 C), temperature source Oral, resp. rate 20, height 5\' 7"  (1.702 m), weight 81.6 kg, last menstrual period 07/22/2016, SpO2 94 %.Body mass index is 28.19 kg/m.  General Appearance: Disheveled  Eye Contact:  Fair  Speech:  Clear and Coherent and Normal Rate  Volume:  Decreased  Mood:  Anxious and Depressed  Affect:  Depressed and Tearful  Thought Process:  Coherent and Descriptions of Associations: Intact  Orientation:  Full (Time, Place, and Person)  Thought Content:  WDL  Suicidal Thoughts:  Yes.  without intent/plan  Homicidal Thoughts:  No  Memory:  Immediate;   Good Recent;   Good Remote;   Good  Judgement:  Fair  Insight:  Good  Psychomotor Activity:  Normal  Concentration:  Concentration: Good and Attention Span: Good  Recall:  Good  Fund of Knowledge:  Good  Language:  Good  Akathisia:  No  Handed:  Right  AIMS (if indicated):     Assets:  Communication Skills Desire for Improvement Resilience  ADL's:  Intact  Cognition:  WNL  Sleep:        Treatment Plan Summary: Daily contact with patient to assess and evaluate symptoms and progress in treatment and Medication management   Seeking placement at [facility]  Disposition: Recommend psychiatric Inpatient admission when medically cleared.  [place], FNP 09/02/2018 1:23 PM

## 2018-09-02 NOTE — ED Notes (Signed)
Patient ask to use the phone to call work, will continue to monitor.

## 2018-09-02 NOTE — ED Notes (Signed)
Pt crying at this time, rocking back and forth in bed. States she is just upset . Darnelle Maffucci, NP at bedside.

## 2018-09-02 NOTE — BH Assessment (Signed)
Assessment Note  Julie Lambert is an 53 y.o. female who presents to ED with suicidal ideations. She reports several stressors including her mother being placed into nursing facility, pt recently loss her housing and has been living in a hotel, and she relapsed using crack cocaine yesterday. Pt reports having an immense amount of guilt with increasing depressive symptoms. Pt reports previous outpatient treatment with [facility] in [place], [place] - her psychiatrist was Dr. Letitia Caul. She reports she has not been seen by this provider since February 2020. Pt was also previously admitted to [facility] in November 2019 for depression and substance use. 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 Pt has been very tearful since being in the ED. She denied HI/AVH. Pt was flat in her affect with poor eye contact. She further reports decreased appetite and poor sleep patterns.   Diagnosis: Bipolar 2 Disorder, by history  Past Medical History:  Past Medical History:  Diagnosis Date  . Bipolar 2 disorder ([place])   . Diabetes mellitus without complication ([place])   . Diverticulitis   . Diverticulosis   . GERD (gastroesophageal reflux disease)   . Sciatica   . Substance abuse ([place])   . Uterine fibroid     Past Surgical History:  Procedure Laterality Date  . BACK SURGERY    . CHOLECYSTECTOMY    . LUMBAR SPINE SURGERY      Family History:  Family History  Problem Relation Age of Onset  . CAD Mother   . Diabetes Mother   . Heart disease Mother   . Parkinson's disease Mother   . Kidney disease Mother   . Cancer Father        prostate cancer  . Breast cancer Maternal Aunt   . Breast cancer Maternal Grandmother     Social History:  reports that she has been smoking cigarettes. She has a 10.00 pack-year smoking history. She has never used smokeless tobacco. She reports previous alcohol use. She reports previous drug use. Drug: "Crack"  cocaine.  Additional Social History:  Alcohol / Drug Use Pain Medications: See MAR Prescriptions: See MAR Over the Counter: See MAR History of alcohol / drug use?: Yes Longest period of sobriety (when/how long): UKN Negative Consequences of Use: Financial, Personal relationships Withdrawal Symptoms: (Denied) Substance #1 Name of Substance 1: Crack Cocaine 1 - Age of First Use: Unable to quantify 1 - Amount (size/oz): Unable to quantify 1 - Frequency: Unable to quantify 1 - Duration: Unable to quantify 1 - Last Use / Amount: 09/01/2018  CIWA: CIWA-Ar BP: (!) 149/88 Pulse Rate: (!) 106 COWS:    Allergies:  Allergies  Allergen Reactions  . Ivp Dye [Iodinated Diagnostic Agents] Itching    Patient stated she had itching on her back after IV injection    Home Medications: (Not in a hospital admission)   OB/GYN Status:  Patient's last menstrual period was 07/22/2016 (approximate).  General Assessment Data Location of Assessment: [facility] ED TTS Assessment: In system Is this a Tele or Face-to-Face Assessment?: Face-to-Face Is this an Initial Assessment or a Re-assessment for this encounter?: Initial Assessment Patient Accompanied by:: N/A Language Other than English: No Living Arrangements: Homeless/Shelter What gender do you identify as?: Female Marital status: Single Maiden name: N/A Pregnancy Status: No Living Arrangements: Other (Comment)(Homeless) Can pt return to current living arrangement?: No Admission Status: Voluntary Is patient capable of signing voluntary admission?: Yes Referral Source: Self/Family/Friend Insurance type: [place] Medicaid  Medical  Screening Exam ([place]) Medical Exam completed: Yes  Crisis Care Plan Living Arrangements: Other (Comment)(Homeless) Legal Guardian: Other:(Self) Name of Psychiatrist: Dr. Hoyle Barr Name of Therapist: None Reported  Education Status Is patient currently in school?: No Is the patient employed, unemployed or  receiving disability?: Employed(Pt is a Training and development officer for [employer])  Risk to self with the past 6 months Suicidal Ideation: Yes-Currently Present Has patient been a risk to self within the past 6 months prior to admission? : Yes Suicidal Intent: Yes-Currently Present Has patient had any suicidal intent within the past 6 months prior to admission? : Yes Is patient at risk for suicide?: Yes Suicidal Plan?: Yes-Currently Present Has patient had any suicidal plan within the past 6 months prior to admission? : Yes Specify Current Suicidal Plan: Pt refused to report Access to Means: No What has been your use of drugs/alcohol within the last 12 months?: Crack Cocaine Previous Attempts/Gestures: No How many times?: 0 Other Self Harm Risks: None Reported Triggers for Past Attempts: None known Intentional Self Injurious Behavior: None Family Suicide History: No Recent stressful life event(s): Conflict (Comment), Loss (Comment), Financial Problems Persecutory voices/beliefs?: No Depression: Yes Depression Symptoms: Despondent, Insomnia, Tearfulness, Isolating, Loss of interest in usual pleasures, Guilt, Fatigue, Feeling worthless/self pity Substance abuse history and/or treatment for substance abuse?: Yes Suicide prevention information given to non-admitted patients: Not applicable  Risk to Others within the past 6 months Homicidal Ideation: No Does patient have any lifetime risk of violence toward others beyond the six months prior to admission? : No Thoughts of Harm to Others: No Current Homicidal Intent: No Current Homicidal Plan: No Access to Homicidal Means: No Identified Victim: None History of harm to others?: No Assessment of Violence: None Noted Violent Behavior Description: None Does patient have access to weapons?: No Criminal Charges Pending?: No Does patient have a court date: No Is patient on probation?: No  Psychosis Hallucinations: None noted Delusions: None  noted  Mental Status Report Appearance/Hygiene: In scrubs Eye Contact: Poor Motor Activity: Freedom of movement Speech: Logical/coherent Level of Consciousness: Quiet/awake Mood: Depressed Affect: Flat, Depressed Anxiety Level: Minimal Thought Processes: Coherent, Relevant Judgement: Unimpaired Orientation: Person, Place, Time, Situation Obsessive Compulsive Thoughts/Behaviors: None  Cognitive Functioning Concentration: Normal Memory: Recent Intact, Remote Intact Is patient IDD: No Insight: Poor Impulse Control: Poor Appetite: Poor Have you had any weight changes? : No Change Sleep: Decreased Total Hours of Sleep: 0 Vegetative Symptoms: None  ADLScreening [facility] Assessment Services) Patient's cognitive ability adequate to safely complete daily activities?: Yes Patient able to express need for assistance with ADLs?: Yes Independently performs ADLs?: Yes (appropriate for developmental age)  Prior Inpatient Therapy Prior Inpatient Therapy: Yes Prior Therapy Dates: 12/2017 Prior Therapy Facilty/Provider(s): [facility] Reason for Treatment: Depression/ SI  Prior Outpatient Therapy Prior Outpatient Therapy: Yes Prior Therapy Dates: Feburary 2020 Prior Therapy Facilty/Provider(s): [facility] ([place] location) Reason for Treatment: Depression/SI Does patient have an ACCT team?: No Does patient have Intensive In-House Services?  : No Does patient have [facility] services? : No Does patient have P4CC services?: No  ADL Screening (condition at time of admission) Patient's cognitive ability adequate to safely complete daily activities?: Yes Patient able to express need for assistance with ADLs?: Yes Independently performs ADLs?: Yes (appropriate for developmental age)       Abuse/Neglect Assessment (Assessment to be complete while patient is alone) Abuse/Neglect Assessment Can Be Completed: Yes Physical Abuse: Denies Verbal Abuse: Denies Sexual Abuse: Denies Exploitation of  patient/patient's resources:  Denies Self-Neglect: Denies Values / Beliefs Cultural Requests During Hospitalization: None Spiritual Requests During Hospitalization: None Consults Spiritual Care Consult Needed: No Social Work Consult Needed: No Regulatory affairs officer (For Healthcare) Does Patient Have a Medical Advance Directive?: No Would patient like information on creating a medical advance directive?: No - Patient declined       Child/Adolescent Assessment Running Away Risk: (Patient is an adult)  Disposition:  Disposition Initial Assessment Completed for this Encounter: Yes Disposition of Patient: Admit Type of inpatient treatment program: Adult Patient refused recommended treatment: No Mode of transportation if patient is discharged/movement?: N/A Patient referred to: Other (Comment)([facility])  On Site Evaluation by:   Reviewed with Physician:    Frederich Cha 09/02/2018 6:09 PM

## 2018-09-02 NOTE — ED Notes (Signed)
Patient ate 50% of supper and beverage.

## 2018-09-02 NOTE — ED Triage Notes (Addendum)
Pt presents to ED via [facility] from [place], pt presents tearful to ED. Pt states "I just don't want to live, I just want to not wake up and stop breathing". Pt sent to ED due to hx of DM and not eating or drinking for the last day. Pt endorses drinking 3 40 oz last night, also endorses smoking crack last night. Pt noted to be tearful stating she is no good to her family. Pt is cooperative but tearful.    Pt presents voluntarily to ED with [facility] from [place].

## 2018-09-02 NOTE — ED Notes (Signed)
Nurse carried out covid 19 swab test as ordered, awaiting results, nurse also called report to nurse in BMU,  Patient to be transferred to their unit as long as covid is negative.

## 2018-09-02 NOTE — ED Notes (Signed)
Patient states that she feels anxious, nervous and needs something to help her to calm down, nurse obtained v/s, notified ED MD.

## 2018-09-02 NOTE — ED Notes (Signed)
Patient ask for snack, Patient is pleasant, she signed consent for treatment, she is voluntary, Patient states that longest she has had clean is 9 months since being in her 75s, she denies Si/hi or avh at this time.

## 2018-09-02 NOTE — ED Notes (Signed)
Patient transferred to room 5, no behavioral issues, she is teary eyed about having to be here, but she states " I do want help: nurse will continue to monitor and camera surveillance for safety.

## 2018-09-02 NOTE — ED Notes (Signed)
Patient talking to TTS at this time, Patient is remaining calm and cooperative. Patient with camera surveillance for safety.

## 2018-09-02 NOTE — ED Notes (Signed)
Pt provided sandwich tray, pt eating, NAD noted

## 2018-09-03 DIAGNOSIS — F339 Major depressive disorder, recurrent, unspecified: Secondary | ICD-10-CM | POA: Diagnosis present

## 2018-09-03 DIAGNOSIS — F3181 Bipolar II disorder: Principal | ICD-10-CM

## 2018-09-03 LAB — GLUCOSE, CAPILLARY
Glucose-Capillary: 168 mg/dL — ABNORMAL HIGH (ref 70–99)
Glucose-Capillary: 187 mg/dL — ABNORMAL HIGH (ref 70–99)
Glucose-Capillary: 120 mg/dL — ABNORMAL HIGH (ref 70–99)

## 2018-09-03 MED ORDER — HYDROXYZINE HCL 25 MG PO TABS
25.0000 mg | ORAL_TABLET | Freq: Three times a day (TID) | ORAL | Status: DC | PRN
  Administered 2018-09-03 – 2018-09-06 (×7): 25 mg via ORAL
  Filled 2018-09-03 (×7): qty 1

## 2018-09-03 MED ORDER — CITALOPRAM HYDROBROMIDE 20 MG PO TABS
10.0000 mg | ORAL_TABLET | ORAL | Status: DC
  Filled 2018-09-03: qty 1

## 2018-09-03 MED ORDER — METFORMIN HCL 500 MG PO TABS
1000.0000 mg | ORAL_TABLET | Freq: Two times a day (BID) | ORAL | Status: DC
  Administered 2018-09-03 – 2018-09-06 (×7): 1000 mg via ORAL
  Filled 2018-09-03 (×7): qty 2

## 2018-09-03 MED ORDER — CITALOPRAM HYDROBROMIDE 20 MG PO TABS: 20.0000 mg | ORAL_TABLET | Freq: Every day | ORAL | Status: DC

## 2018-09-03 MED ORDER — FOLIC ACID 1 MG PO TABS
1.0000 mg | ORAL_TABLET | Freq: Every day | ORAL | Status: DC
  Administered 2018-09-03 – 2018-09-06 (×4): 1 mg via ORAL
  Filled 2018-09-03 (×4): qty 1

## 2018-09-03 MED ORDER — CHLORDIAZEPOXIDE HCL 25 MG PO CAPS: 25.0000 mg | ORAL_CAPSULE | Freq: Three times a day (TID) | ORAL | Status: DC | PRN

## 2018-09-03 MED ORDER — MAGNESIUM HYDROXIDE 400 MG/5ML PO SUSP
30.0000 mL | Freq: Every day | ORAL | Status: DC | PRN
  Administered 2018-09-06: 30 mL via ORAL
  Filled 2018-09-03: qty 30

## 2018-09-03 MED ORDER — TRAZODONE HCL 50 MG PO TABS
150.0000 mg | ORAL_TABLET | Freq: Every day | ORAL | Status: DC
  Administered 2018-09-03 – 2018-09-05 (×3): 150 mg via ORAL
  Filled 2018-09-03 (×3): qty 1

## 2018-09-03 MED ORDER — VITAMIN B-1 100 MG PO TABS
100.0000 mg | ORAL_TABLET | Freq: Every day | ORAL | Status: DC
  Administered 2018-09-03 – 2018-09-06 (×4): 100 mg via ORAL
  Filled 2018-09-03 (×4): qty 1

## 2018-09-03 MED ORDER — PANTOPRAZOLE SODIUM 40 MG PO TBEC
40.0000 mg | DELAYED_RELEASE_TABLET | Freq: Every day | ORAL | Status: DC
  Administered 2018-09-03 – 2018-09-06 (×4): 40 mg via ORAL
  Filled 2018-09-03 (×4): qty 1

## 2018-09-03 MED ORDER — INSULIN GLARGINE 100 UNIT/ML ~~LOC~~ SOLN
15.0000 [IU] | Freq: Every day | SUBCUTANEOUS | Status: DC
  Administered 2018-09-03: 15 [IU] via SUBCUTANEOUS
  Filled 2018-09-03 (×2): qty 0.15

## 2018-09-03 MED ORDER — TRAZODONE HCL 50 MG PO TABS: 50.0000 mg | ORAL_TABLET | Freq: Every evening | ORAL | Status: DC | PRN

## 2018-09-03 MED ORDER — GABAPENTIN 300 MG PO CAPS
300.0000 mg | ORAL_CAPSULE | Freq: Three times a day (TID) | ORAL | Status: DC
  Administered 2018-09-03 – 2018-09-05 (×6): 300 mg via ORAL
  Filled 2018-09-03 (×6): qty 1

## 2018-09-03 MED ORDER — DULOXETINE HCL 30 MG PO CPEP
30.0000 mg | ORAL_CAPSULE | Freq: Every day | ORAL | Status: DC
  Administered 2018-09-03 – 2018-09-04 (×2): 30 mg via ORAL
  Filled 2018-09-03 (×2): qty 1

## 2018-09-03 MED ORDER — IBUPROFEN 600 MG PO TABS
600.0000 mg | ORAL_TABLET | Freq: Four times a day (QID) | ORAL | Status: DC | PRN
  Administered 2018-09-03 – 2018-09-06 (×8): 600 mg via ORAL
  Filled 2018-09-03 (×8): qty 1

## 2018-09-03 MED ORDER — ATORVASTATIN CALCIUM 10 MG PO TABS
10.0000 mg | ORAL_TABLET | Freq: Every day | ORAL | Status: DC
  Administered 2018-09-03 – 2018-09-05 (×3): 10 mg via ORAL
  Filled 2018-09-03 (×4): qty 1

## 2018-09-03 MED ORDER — CYCLOBENZAPRINE HCL 10 MG PO TABS
5.0000 mg | ORAL_TABLET | Freq: Three times a day (TID) | ORAL | Status: DC | PRN
  Administered 2018-09-05 (×2): 5 mg via ORAL
  Filled 2018-09-03 (×2): qty 1

## 2018-09-03 MED ORDER — CITALOPRAM HYDROBROMIDE 10 MG PO TABS
10.0000 mg | ORAL_TABLET | Freq: Every day | ORAL | Status: DC
  Administered 2018-09-03: 10 mg via ORAL
  Filled 2018-09-03: qty 1

## 2018-09-03 MED ORDER — ACETAMINOPHEN 325 MG PO TABS
650.0000 mg | ORAL_TABLET | Freq: Four times a day (QID) | ORAL | Status: DC | PRN
  Administered 2018-09-04 – 2018-09-06 (×2): 650 mg via ORAL
  Filled 2018-09-03 (×2): qty 2

## 2018-09-03 MED ORDER — NICOTINE 21 MG/24HR TD PT24
21.0000 mg | MEDICATED_PATCH | Freq: Every day | TRANSDERMAL | Status: DC
  Administered 2018-09-03 – 2018-09-06 (×4): 21 mg via TRANSDERMAL
  Filled 2018-09-03 (×4): qty 1

## 2018-09-03 MED ORDER — ALUM & MAG HYDROXIDE-SIMETH 200-200-20 MG/5ML PO SUSP: 30.0000 mL | ORAL | Status: DC | PRN
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
## 2018-09-03 NOTE — H&P (Signed)
Psychiatric Admission Assessment Adult  Patient Identification: Julie Lambert MRN:  [id number] Date of Evaluation:  09/03/2018 Chief Complaint:  Bipolar Depression  Principal Diagnosis: Bipolar 2 disorder ([place]) Diagnosis:  Active Problems:   Major depression, recurrent ([place])  History of Present Illness: Patient is seen and examined.  Patient is a 53 year old female with a past psychiatric history significant for bipolar disorder and substance abuse issues who presented to the [facility] emergency department on 09/02/2018 with suicidal ideation.  The patient stated that she recently lost her housing, and has been living in a long-term hotel.  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  She was last psychiatrically hospitalized at our facility in November 2019.  Her diagnosis at that time was bipolar disorder, and she was discharged to go to a rehabilitation facility.  She stated she stayed there until February 2020.  She was released at that time.  She stated she had been sober of substances and alcohol since then.  She stated that she had suffered grief because her father had died while she was at the rehabilitation facility.  She also stated that she was released from the rehabilitation facility because staff felt as though her anger issues were more related to her psychiatric condition and not to her substance abuse issues.  She did not follow-up with psychiatry afterwards.  She stated that she has been having trouble at work because of her anger.  Apparently her mother is a resident at the facility that she works at, and they have become frustrated with the patient over her demands.  She is tearful during the interview today.  She did state at one point that she had continued taking the Celexa on which she had been discharged on, but then also stated she had not had many of her  medications since March or April.  She admitted to helplessness, hopelessness and worthlessness.  She was admitted to the hospital for evaluation and stabilization.  Associated Signs/Symptoms: Depression Symptoms:  depressed mood, anhedonia, insomnia, psychomotor agitation, fatigue, feelings of worthlessness/guilt, difficulty concentrating, hopelessness, suicidal thoughts without plan, anxiety, loss of energy/fatigue, (Hypo) Manic Symptoms:  Impulsivity, Irritable Mood, Labiality of Mood, Anxiety Symptoms:  Excessive Worry, Psychotic Symptoms:  denied PTSD Symptoms: Negative Total Time spent with patient: 30 minutes  Past Psychiatric History: Patient has a history of recurrent depression with suicidal ideation with a diagnosis of bipolar disorder.  Also has a history of cocaine abuse with recurrent relapses.  Has had substance abuse treatment in the past but recently has difficulty maintaining compliance  Is the patient at risk to self? Yes.    Has the patient been a risk to self in the past 6 months? Yes.    Has the patient been a risk to self within the distant past? No.  Is the patient a risk to others? No.  Has the patient been a risk to others in the past 6 months? No.  Has the patient been a risk to others within the distant past? No.   Prior Inpatient Therapy:   Prior Outpatient Therapy:    Alcohol Screening: 1. How often do you have a drink containing alcohol?: 4 or more times a week 2. How many drinks containing alcohol do you have on a typical day when you are drinking?: 10 or more 3. How often do you have six or more drinks  on one occasion?: Daily or almost daily AUDIT-C Score: 12 4. How often during the last year have you found that you were not able to stop drinking once you had started?: Never 5. How often during the last year have you failed to do what was normally expected from you becasue of drinking?: Never 6. How often during the last year have you needed a  first drink in the morning to get yourself going after a heavy drinking session?: Never 7. How often during the last year have you had a feeling of guilt of remorse after drinking?: Never 8. How often during the last year have you been unable to remember what happened the night before because you had been drinking?: Never 9. Have you or someone else been injured as a result of your drinking?: No 10. Has a relative or friend or a doctor or another health worker been concerned about your drinking or suggested you cut down?: No Alcohol Use Disorder Identification Test Final Score (AUDIT): 0 Alcohol Brief Interventions/Follow-up: AUDIT Score <7 follow-up not indicated Substance Abuse History in the last 12 months:  Yes.   Consequences of Substance Abuse: Medical Consequences:  : Patient has been admitted to the hospital on 3 occasions because of substance related issues.  She is also been in substance rehabilitation within the last 8 months, and previously had been admitted for detox and rehabs in the past. Previous Psychotropic Medications: Yes  Psychological Evaluations: Yes  Past Medical History:  Past Medical History:  Diagnosis Date  . Bipolar 2 disorder ([place])   . Diabetes mellitus without complication ([place])   . Diverticulitis   . Diverticulosis   . GERD (gastroesophageal reflux disease)   . Sciatica   . Substance abuse ([place])   . Uterine fibroid     Past Surgical History:  Procedure Laterality Date  . BACK SURGERY    . CHOLECYSTECTOMY    . LUMBAR SPINE SURGERY     Family History:  Family History  Problem Relation Age of Onset  . CAD Mother   . Diabetes Mother   . Heart disease Mother   . Parkinson's disease Mother   . Kidney disease Mother   . Cancer Father        prostate cancer  . Breast cancer Maternal Aunt   . Breast cancer Maternal Grandmother    Family Psychiatric  History: Denied Tobacco Screening:   Social History:  Social History   Substance and Sexual  Activity  Alcohol Use Not Currently   Comment: last use 7/22 3 40oz      Social History   Substance and Sexual Activity  Drug Use Not Currently  . Types: "Crack" cocaine   Comment: last use 7/22, smoked crack    Additional Social History:                           Allergies:   Allergies  Allergen Reactions  . Ivp Dye [Iodinated Diagnostic Agents] Itching    Patient stated she had itching on her back after IV injection   Lab Results:  Results for orders placed or performed during the hospital encounter of 09/02/18 (from the past 48 hour(s))  Glucose, capillary     Status: Abnormal   Collection Time: 09/03/18  7:05 AM  Result Value Ref Range   Glucose-Capillary 168 (H) 70 - 99 mg/dL  Glucose, capillary     Status: Abnormal   Collection Time: 09/03/18 12:03 PM  Result Value Ref Range   Glucose-Capillary 187 (H) 70 - 99 mg/dL    Blood Alcohol level:  Lab Results  Component Value Date   ETH 113 (H) 09/02/2018   ETH <10 40/09/6759    Metabolic Disorder Labs:  Lab Results  Component Value Date   HGBA1C 8.2 (H) 03/19/2018   MPG 188.64 03/19/2018   MPG 312.05 12/26/2017   No results found for: PROLACTIN Lab Results  Component Value Date   CHOL 126 03/19/2018   TRIG 120 03/19/2018   HDL 63 03/19/2018   CHOLHDL 2.0 03/19/2018   VLDL 24 03/19/2018   LDLCALC 39 03/19/2018   LDLCALC 114 (H) 12/26/2017    Current Medications: Current Facility-Administered Medications  Medication Dose Route Frequency Provider Last Rate Last Dose  . acetaminophen (TYLENOL) tablet 650 mg  650 mg Oral Q6H PRN Sharma Covert, MD      . alum & mag hydroxide-simeth (MAALOX/MYLANTA) 200-200-20 MG/5ML suspension 30 mL  30 mL Oral Q4H PRN Sharma Covert, MD      . atorvastatin (LIPITOR) tablet 10 mg  10 mg Oral q1800 Sharma Covert, MD      . citalopram (CELEXA) tablet 10 mg  10 mg Oral Daily Sharma Covert, MD   10 mg at 09/03/18 1045  . cyclobenzaprine (FLEXERIL)  tablet 5 mg  5 mg Oral TID PRN Sharma Covert, MD      . gabapentin (NEURONTIN) capsule 300 mg  300 mg Oral TID Sharma Covert, MD   300 mg at 09/03/18 1045  . hydrOXYzine (ATARAX/VISTARIL) tablet 25 mg  25 mg Oral TID PRN Sharma Covert, MD      . ibuprofen (ADVIL) tablet 600 mg  600 mg Oral Q6H PRN Sharma Covert, MD   600 mg at 09/03/18 1045  . insulin glargine (LANTUS) injection 15 Units  15 Units Subcutaneous QHS Sharma Covert, MD      . magnesium hydroxide (MILK OF MAGNESIA) suspension 30 mL  30 mL Oral Daily PRN Sharma Covert, MD      . metFORMIN (GLUCOPHAGE) tablet 1,000 mg  1,000 mg Oral BID WC Sharma Covert, MD   1,000 mg at 09/03/18 1045  . pantoprazole (PROTONIX) EC tablet 40 mg  40 mg Oral Daily Sharma Covert, MD   40 mg at 09/03/18 1045  . traZODone (DESYREL) tablet 150 mg  150 mg Oral QHS [place], Geni Bers, NP       PTA Medications: Medications Prior to Admission  Medication Sig Dispense Refill Last Dose  . atorvastatin (LIPITOR) 10 MG tablet Take 1 tablet (10 mg total) by mouth daily. 90 tablet 1   . citalopram (CELEXA) 20 MG tablet Take 1 tablet (20 mg total) by mouth daily. 30 tablet 1   . cyclobenzaprine (FLEXERIL) 5 MG tablet Take 1 tablet (5 mg total) by mouth 3 (three) times daily as needed for muscle spasms. (Patient not taking: Reported on 09/02/2018) 15 tablet 0   . gabapentin (NEURONTIN) 300 MG capsule Take 1 capsule (300 mg total) by mouth 3 (three) times daily. 90 capsule 1   . hydrOXYzine (ATARAX/VISTARIL) 25 MG tablet Take 1 tablet (25 mg total) by mouth 3 (three) times daily as needed for anxiety. 90 tablet 1   . ibuprofen (ADVIL) 600 MG tablet Take 1 tablet (600 mg total) by mouth every 6 (six) hours as needed. (Patient not taking: Reported on 09/02/2018) 20 tablet 0   . Insulin Glargine (LANTUS  SOLOSTAR) 100 UNIT/ML Solostar Pen Inject 15 Units into the skin at bedtime. 5 pen PRN   . metFORMIN (GLUCOPHAGE) 1000 MG tablet Take 1  tablet (1,000 mg total) by mouth 2 (two) times daily with a meal. 180 tablet 1   . naproxen (NAPROSYN) 500 MG tablet Take 1 tablet (500 mg total) by mouth 2 (two) times daily. (Patient not taking: Reported on 09/02/2018) 20 tablet 0   . omeprazole (PRILOSEC) 40 MG capsule Take 40 mg by mouth daily.     . traZODone (DESYREL) 150 MG tablet Take 1 tablet (150 mg total) by mouth at bedtime as needed for sleep. 30 tablet 1     Musculoskeletal: Strength & Muscle Tone: within normal limits Gait & Station: normal Patient leans: N/A  Psychiatric Specialty Exam: Physical Exam  Nursing note and vitals reviewed. Constitutional: She is oriented to person, place, and time. She appears well-developed and well-nourished.  HENT:  Head: Normocephalic and atraumatic.  Respiratory: Effort normal.  Neurological: She is alert and oriented to person, place, and time.    ROS  Blood pressure (!) 145/95, pulse 71, temperature 97.6 F (36.4 C), temperature source Oral, resp. rate 16, height 5\' 7"  (1.702 m), weight 81.6 kg, last menstrual period 07/22/2016, SpO2 100 %.Body mass index is 28.18 kg/m.  General Appearance: Disheveled  Eye Contact:  Fair  Speech:  Normal Rate  Volume:  Normal  Mood:  Anxious and Depressed  Affect:  Congruent  Thought Process:  Coherent and Descriptions of Associations: Intact  Orientation:  Full (Time, Place, and Person)  Thought Content:  Logical  Suicidal Thoughts:  Yes.  without intent/plan  Homicidal Thoughts:  No  Memory:  Immediate;   Fair Recent;   Fair Remote;   Fair  Judgement:  Intact  Insight:  Good  Psychomotor Activity:  Increased  Concentration:  Concentration: Fair and Attention Span: Fair  Recall:  [employer] of Knowledge:  Fair  Language:  Fair  Akathisia:  Negative  Handed:  Right  AIMS (if indicated):     Assets:  Desire for Improvement Resilience  ADL's:  Intact  Cognition:  WNL  Sleep:  Number of Hours: 5    Treatment Plan Summary: Daily  contact with patient to assess and evaluate symptoms and progress in treatment, Medication management and Plan : Patient is seen and examined.  Patient is a 53 year old female with a past psychiatric history significant for bipolar disorder, substance abuse issues.  She will be admitted to the hospital.  She will be integrated into the milieu.  She will be encouraged to attend groups.  Her Celexa will be increased to 20 mg p.o. daily.  The rest of her medications will be restarted as previously given.  This includes her Flexeril and gabapentin as well as her previous diabetic dosages.  She is very concerned about getting back to work over the weekend and afraid to lose her job.  We will try and do the best that we can to get her stabilized in the short run.  Additionally we will check her sugars 4 times a day.  Review of her laboratories revealed her blood sugar to be elevated at 187, but then essentially other electrolytes within normal limits.  Her white blood cell count was mildly elevated 11.5 but otherwise normal.  Urinalysis from 7/2 was in the chart, and at that time she had urinary tract infection but was not rechecked.  Her blood alcohol on admission was 113, and her drug  screen was positive for cocaine.  She stated that she had not had any alcohol except during the last week, so her chance of withdrawal should be minimal.  Her blood pressure is mildly elevated, but and I will keep a Librium 25 mg p.o. every 6 hours PRN a CIWA greater than 10.  We will also give her thiamine and folic acid just in case.  Observation Level/Precautions:  15 minute checks  Laboratory:  Chemistry Profile  Psychotherapy:    Medications:    Consultations:    Discharge Concerns:    Estimated LOS:  Other:     Physician Treatment Plan for Primary Diagnosis: Bipolar 2 disorder ([place]) Long Term Goal(s): Improvement in symptoms so as ready for discharge  Short Term Goals: Ability to identify changes in lifestyle to reduce  recurrence of condition will improve, Ability to verbalize feelings will improve, Ability to disclose and discuss suicidal ideas, Ability to demonstrate self-control will improve, Ability to identify and develop effective coping behaviors will improve, Ability to maintain clinical measurements within normal limits will improve, Compliance with prescribed medications will improve and Ability to identify triggers associated with substance abuse/mental health issues will improve  Physician Treatment Plan for Secondary Diagnosis: Active Problems:   Major depression, recurrent ([place])  Long Term Goal(s): Improvement in symptoms so as ready for discharge  Short Term Goals: Ability to identify changes in lifestyle to reduce recurrence of condition will improve, Ability to verbalize feelings will improve, Ability to disclose and discuss suicidal ideas, Ability to demonstrate self-control will improve, Ability to identify and develop effective coping behaviors will improve, Ability to maintain clinical measurements within normal limits will improve, Compliance with prescribed medications will improve and Ability to identify triggers associated with substance abuse/mental health issues will improve  I certify that inpatient services furnished can reasonably be expected to improve the patient's condition.    Sharma Covert, MD 7/24/202012:54 PM

## 2018-09-03 NOTE — BHH Suicide Risk Assessment (Signed)
[facility] Admission Suicide Risk Assessment   Nursing information obtained from:  Patient Demographic factors:  Living alone Current Mental Status:  NA Loss Factors:  NA Historical Factors:  NA Risk Reduction Factors:  NA  Total Time spent with patient: 30 minutes Principal Problem: Bipolar 2 disorder ([place]) Diagnosis:  Active Problems:   Major depression, recurrent ([place])  Subjective Data: Patient is seen and examined.  Patient is a 53 year old female with a past psychiatric history significant for bipolar disorder and substance abuse issues who presented to the [facility] emergency department on 09/02/2018 with suicidal ideation.  The patient stated that she recently lost her housing, and has been living in a long-term hotel.  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  She was last psychiatrically hospitalized at our facility in November 2019.  Her diagnosis at that time was bipolar disorder, and she was discharged to go to a rehabilitation facility.  She stated she stayed there until February 2020.  She was released at that time.  She stated she had been sober of substances and alcohol since then.  She stated that she had suffered grief because her father had died while she was at the rehabilitation facility.  She also stated that she was released from the rehabilitation facility because staff felt as though her anger issues were more related to her psychiatric condition and not to her substance abuse issues.  She did not follow-up with psychiatry afterwards.  She stated that she has been having trouble at work because of her anger.  Apparently her mother is a resident at the facility that she works at, and they have become frustrated with the patient over her demands.  She is tearful during the interview today.  She did state at one point that she had continued taking the Celexa on  which she had been discharged on, but then also stated she had not had many of her medications since March or April.  She admitted to helplessness, hopelessness and worthlessness.  She was admitted to the hospital for evaluation and stabilization.  Continued Clinical Symptoms:  Alcohol Use Disorder Identification Test Final Score (AUDIT): 0 The "Alcohol Use Disorders Identification Test", Guidelines for Use in Primary Care, Second Edition.  World Pharmacologist [facility]). Score between 0-7:  no or low risk or alcohol related problems. Score between 8-15:  moderate risk of alcohol related problems. Score between 16-19:  high risk of alcohol related problems. Score 20 or above:  warrants further diagnostic evaluation for alcohol dependence and treatment.   CLINICAL FACTORS:   Bipolar Disorder:   Depressive phase Alcohol/Substance Abuse/Dependencies   Musculoskeletal: Strength & Muscle Tone: within normal limits Gait & Station: normal Patient leans: N/A  Psychiatric Specialty Exam: Physical Exam  Nursing note and vitals reviewed. Constitutional: She is oriented to person, place, and time. She appears well-developed and well-nourished.  HENT:  Head: Normocephalic and atraumatic.  Respiratory: Effort normal.  Neurological: She is alert and oriented to person, place, and time.    ROS  Blood pressure (!) 145/95, pulse 71, temperature 97.6 F (36.4 C), temperature source Oral, resp. rate 16, height 5\' 7"  (1.702 m), weight 81.6 kg, last menstrual period 07/22/2016, SpO2 100 %.Body mass index is 28.18 kg/m.  General Appearance: Disheveled  Eye Contact:  Fair  Speech:  Normal Rate  Volume:  Normal  Mood:  Anxious and Depressed  Affect:  Congruent  Thought Process:  Coherent and Descriptions of Associations: Intact  Orientation:  Full (Time, Place, and Person)  Thought Content:  Logical  Suicidal Thoughts:  Yes.  without intent/plan  Homicidal Thoughts:  No  Memory:  Immediate;    Fair Recent;   Fair Remote;   Fair  Judgement:  Intact  Insight:  Fair  Psychomotor Activity:  Increased  Concentration:  Concentration: Fair and Attention Span: Fair  Recall:  [employer] of Knowledge:  Fair  Language:  Good  Akathisia:  Negative  Handed:  Right  AIMS (if indicated):     Assets:  Desire for Improvement Resilience  ADL's:  Intact  Cognition:  WNL  Sleep:  Number of Hours: 5      COGNITIVE FEATURES THAT CONTRIBUTE TO RISK:  None    SUICIDE RISK:   Minimal: No identifiable suicidal ideation.  Patients presenting with no risk factors but with morbid ruminations; may be classified as minimal risk based on the severity of the depressive symptoms  PLAN OF CARE: Patient is seen and examined.  Patient is a 53 year old female with a past psychiatric history significant for bipolar disorder, substance abuse issues.  She will be admitted to the hospital.  She will be integrated into the milieu.  She will be encouraged to attend groups.  Her Celexa will be increased to 20 mg p.o. daily.  The rest of her medications will be restarted as previously given.  This includes her Flexeril and gabapentin as well as her previous diabetic dosages.  She is very concerned about getting back to work over the weekend and afraid to lose her job.  We will try and do the best that we can to get her stabilized in the short run.  I certify that inpatient services furnished can reasonably be expected to improve the patient's condition.   Sharma Covert, MD 09/03/2018, 12:47 PM

## 2018-09-03 NOTE — Progress Notes (Signed)
Admission Note:  53 yr female who presents Voluntary commitment in no acute distress for the treatment of SI, Substance Abuse and Depression. Patientn appears flat, sad and  Depressed, tearful at times, she looked restless, tired and anxious. Patient was reassured and she later became calm and cooperative with admission process. Patient denies SI and contracts for safety upon admission. Patient expressed she has been doing well just relapsed on cocaine just recently after been sober for 9 months. Patient expressed depression come from shame and guilt. Patient denies SI/HI/AVH, he has past medical Hx of  Depression and DM without complication, Bipolar,and substance Abuse. Skin was assessed and found to be clear of any abnormal marks, she was also searched and no contraband found, POC and unit policies explained and understanding verbalized, Food and fluids offered, and fluids accepted. 15 minutes safety checks maintained will continue to monitor.

## 2018-09-03 NOTE — Plan of Care (Signed)
D- Patient alert and oriented. Patient presents in a pleasant mood on assessment stating that ok last night and had complaints of a headache, in which she rated it a "9/10" and requested pain medication from this Probation officer. Patient endorsed depression and anxiety, reporting that "being back here, knowing I need to be at work, I just crashed yesterday" is why she's feeling this way. Patient also stated that she had been clean for 9 months and relapsed a few days ago. Patient denied SI, HI, AVH at this time. Patient's goal for today is to become "emotionally stable", in which she will "talk with doctor" in order to accomplish her goal.  A- Scheduled medications administered to patient, per MD orders. Support and encouragement provided.  Routine safety checks conducted every 15 minutes.  Patient informed to notify staff with problems or concerns.  R- No adverse drug reactions noted. Patient contracts for safety at this time. Patient compliant with medications and treatment plan. Patient receptive, calm, and cooperative. Patient interacts well with others on the unit.  Patient remains safe at this time.  Problem: Education: Goal: Knowledge of [place] [facility] Education information/materials will improve Outcome: Progressing Goal: Emotional status will improve Outcome: Progressing Goal: Mental status will improve Outcome: Progressing Goal: Verbalization of understanding the information provided will improve Outcome: Progressing   Problem: Safety: Goal: Periods of time without injury will increase Outcome: Progressing   Problem: Education: Goal: Utilization of techniques to improve thought processes will improve Outcome: Progressing Goal: Knowledge of the prescribed therapeutic regimen will improve Outcome: Progressing   Problem: Safety: Goal: Ability to disclose and discuss suicidal ideas will improve Outcome: Progressing

## 2018-09-03 NOTE — Progress Notes (Signed)
Patient is cooperating and resting quietly in her room , minimal socialization , no distress, takes her medications  With out any noticeable side effects and tolerating care well and has no complains at this time only requiring 15 minutes safety checks.

## 2018-09-03 NOTE — Tx Team (Signed)
Initial Treatment Plan 09/03/2018 1:39 AM [name] MSX:[id number]    PATIENT STRESSORS: Loss of home Substance abuse   PATIENT STRENGTHS: Motivation for treatment/growth Supportive family/friends   PATIENT IDENTIFIED PROBLEMS: Substance Abuse  Depression  Anxiety                 DISCHARGE CRITERIA:  Motivation to continue treatment in a less acute level of care Verbal commitment to aftercare and medication compliance  PRELIMINARY DISCHARGE PLAN: Attend 12-step recovery group Placement in alternative living arrangements  PATIENT/FAMILY INVOLVEMENT: This treatment plan has been presented to and reviewed with the patient, Julie Lambert. The patient has been given the opportunity to ask questions and make suggestions.  Mallie Darting, RN 09/03/2018, 1:39 AM

## 2018-09-03 NOTE — Plan of Care (Signed)
Patient newly admitted, hasn't had time to progress.   Problem: Education: Goal: Knowledge of [place] [facility] Education information/materials will improve Outcome: Not Progressing Goal: Emotional status will improve Outcome: Not Progressing Goal: Mental status will improve Outcome: Not Progressing Goal: Verbalization of understanding the information provided will improve Outcome: Not Progressing   Problem: Safety: Goal: Periods of time without injury will increase Outcome: Not Progressing   Problem: Education: Goal: Utilization of techniques to improve thought processes will improve Outcome: Not Progressing Goal: Knowledge of the prescribed therapeutic regimen will improve Outcome: Not Progressing

## 2018-09-03 NOTE — Tx Team (Signed)
Interdisciplinary Treatment and Diagnostic Plan Update  09/03/2018 Time of Session: 10am Julie Lambert MRN: [id number]  Principal Diagnosis: Bipolar 2 disorder ([place])  Secondary Diagnoses: Active Problems:   Major depression, recurrent ([facility])   Current Medications:  Current Facility-Administered Medications  Medication Dose Route Frequency Provider Last Rate Last Dose  . acetaminophen (TYLENOL) tablet 650 mg  650 mg Oral Q6H PRN Sharma Covert, MD      . alum & mag hydroxide-simeth (MAALOX/MYLANTA) 200-200-20 MG/5ML suspension 30 mL  30 mL Oral Q4H PRN Sharma Covert, MD      . atorvastatin (LIPITOR) tablet 10 mg  10 mg Oral q1800 Sharma Covert, MD      . citalopram (CELEXA) tablet 10 mg  10 mg Oral Daily Sharma Covert, MD      . cyclobenzaprine (FLEXERIL) tablet 5 mg  5 mg Oral TID PRN Sharma Covert, MD      . gabapentin (NEURONTIN) capsule 300 mg  300 mg Oral TID Sharma Covert, MD      . hydrOXYzine (ATARAX/VISTARIL) tablet 25 mg  25 mg Oral TID PRN Sharma Covert, MD      . ibuprofen (ADVIL) tablet 600 mg  600 mg Oral Q6H PRN Sharma Covert, MD      . insulin glargine (LANTUS) injection 15 Units  15 Units Subcutaneous QHS Sharma Covert, MD      . magnesium hydroxide (MILK OF MAGNESIA) suspension 30 mL  30 mL Oral Daily PRN Sharma Covert, MD      . metFORMIN (GLUCOPHAGE) tablet 1,000 mg  1,000 mg Oral BID WC Sharma Covert, MD      . pantoprazole (PROTONIX) EC tablet 40 mg  40 mg Oral Daily Sharma Covert, MD      . traZODone (DESYREL) tablet 150 mg  150 mg Oral QHS Lamont Dowdy, NP       PTA Medications: Medications Prior to Admission  Medication Sig Dispense Refill Last Dose  . atorvastatin (LIPITOR) 10 MG tablet Take 1 tablet (10 mg total) by mouth daily. 90 tablet 1   . citalopram (CELEXA) 20 MG tablet Take 1 tablet (20 mg total) by mouth daily. 30 tablet 1   . cyclobenzaprine (FLEXERIL) 5 MG tablet Take 1 tablet  (5 mg total) by mouth 3 (three) times daily as needed for muscle spasms. (Patient not taking: Reported on 09/02/2018) 15 tablet 0   . gabapentin (NEURONTIN) 300 MG capsule Take 1 capsule (300 mg total) by mouth 3 (three) times daily. 90 capsule 1   . hydrOXYzine (ATARAX/VISTARIL) 25 MG tablet Take 1 tablet (25 mg total) by mouth 3 (three) times daily as needed for anxiety. 90 tablet 1   . ibuprofen (ADVIL) 600 MG tablet Take 1 tablet (600 mg total) by mouth every 6 (six) hours as needed. (Patient not taking: Reported on 09/02/2018) 20 tablet 0   . Insulin Glargine (LANTUS SOLOSTAR) 100 UNIT/ML Solostar Pen Inject 15 Units into the skin at bedtime. 5 pen PRN   . metFORMIN (GLUCOPHAGE) 1000 MG tablet Take 1 tablet (1,000 mg total) by mouth 2 (two) times daily with a meal. 180 tablet 1   . naproxen (NAPROSYN) 500 MG tablet Take 1 tablet (500 mg total) by mouth 2 (two) times daily. (Patient not taking: Reported on 09/02/2018) 20 tablet 0   . omeprazole (PRILOSEC) 40 MG capsule Take 40 mg by mouth daily.     . traZODone (DESYREL) 150 MG tablet  Take 1 tablet (150 mg total) by mouth at bedtime as needed for sleep. 30 tablet 1     Patient Stressors: Loss of home Substance abuse  Patient Strengths: Motivation for treatment/growth Supportive family/friends  Treatment Modalities: Medication Management, Group therapy, Case management,  1 to 1 session with clinician, Psychoeducation, Recreational therapy.   Physician Treatment Plan for Primary Diagnosis: Bipolar 2 disorder ([place]) Long Term Goal(s):     Short Term Goals:    Medication Management: Evaluate patient's response, side effects, and tolerance of medication regimen.  Therapeutic Interventions: 1 to 1 sessions, Unit Group sessions and Medication administration.  Evaluation of Outcomes: Not Met  Physician Treatment Plan for Secondary Diagnosis: Active Problems:   Major depression, recurrent ([place])  Long Term Goal(s):     Short Term Goals:        Medication Management: Evaluate patient's response, side effects, and tolerance of medication regimen.  Therapeutic Interventions: 1 to 1 sessions, Unit Group sessions and Medication administration.  Evaluation of Outcomes: Not Met   RN Treatment Plan for Primary Diagnosis: Bipolar 2 disorder ([place]) Long Term Goal(s): Knowledge of disease and therapeutic regimen to maintain health will improve  Short Term Goals: Ability to participate in decision making will improve, Ability to verbalize feelings will improve, Ability to disclose and discuss suicidal ideas, Ability to identify and develop effective coping behaviors will improve and Compliance with prescribed medications will improve  Medication Management: RN will administer medications as ordered by provider, will assess and evaluate patient's response and provide education to patient for prescribed medication. RN will report any adverse and/or side effects to prescribing provider.  Therapeutic Interventions: 1 on 1 counseling sessions, Psychoeducation, Medication administration, Evaluate responses to treatment, Monitor vital signs and CBGs as ordered, Perform/monitor CIWA, COWS, AIMS and Fall Risk screenings as ordered, Perform wound care treatments as ordered.  Evaluation of Outcomes: Not Met   LCSW Treatment Plan for Primary Diagnosis: Bipolar 2 disorder ([place]) Long Term Goal(s): Safe transition to appropriate next level of care at discharge, Engage patient in therapeutic group addressing interpersonal concerns.  Short Term Goals: Engage patient in aftercare planning with referrals and resources  Therapeutic Interventions: Assess for all discharge needs, 1 to 1 time with Social worker, Explore available resources and support systems, Assess for adequacy in community support network, Educate family and significant other(s) on suicide prevention, Complete Psychosocial Assessment, Interpersonal group therapy.  Evaluation of Outcomes:  Not Met   Progress in Treatment: Attending groups: No. Participating in groups: No. Taking medication as prescribed: Yes. Toleration medication: Yes. Family/Significant other contact made: No, will contact:  pt declined family contact Patient understands diagnosis: Yes. Discussing patient identified problems/goals with staff: Yes. Medical problems stabilized or resolved: No. Denies suicidal/homicidal ideation: Yes. Issues/concerns per patient self-inventory: No. Other: NA  New problem(s) identified: No, Describe:  none reported  New Short Term/Long Term Goal(s):Attend outpatient treatment, take medication as prescribed, develop and implement healthy coping methods to manage stress  Patient Goals:  "I need my medication and get out of here as quickly as possible"  Discharge Plan or Barriers: Pt will return home and follow up at [facility]  Reason for Continuation of Hospitalization: Medication stabilization  Estimated Length of Stay:3-5 days  Attendees: Patient:Julie Lambert 09/03/2018 10:36 AM  Physician: Myles Lipps 09/03/2018 10:36 AM  Nursing: Polly Cobia 09/03/2018 10:36 AM  RN Care Manager: 09/03/2018 10:36 AM  Social Worker: Danuel Felicetti Zen[place]ia Jarred Moton Assunta Curtis 09/03/2018 10:36 AM  Recreational Therapist:  09/03/2018 10:36 AM  Other:  09/03/2018 10:36 AM  Other:  09/03/2018 10:36 AM  Other: 09/03/2018 10:36 AM    Scribe for Treatment Team: Yvette Rack, LCSW 09/03/2018 10:36 AM

## 2018-09-03 NOTE — BHH Group Notes (Signed)
LCSW Group Therapy Note  09/03/2018 12:42 PM  Type of Therapy and Topic:  Group Therapy:  Feelings around Relapse and Recovery  Participation Level:  Did Not Attend   Description of Group:    Patients in this group will discuss emotions they experience before and after a relapse. They will process how experiencing these feelings, or avoidance of experiencing them, relates to having a relapse. Facilitator will guide patients to explore emotions they have related to recovery. Patients will be encouraged to process which emotions are more powerful. They will be guided to discuss the emotional reaction significant others in their lives may have to their relapse or recovery. Patients will be assisted in exploring ways to respond to the emotions of others without this contributing to a relapse.  Therapeutic Goals: 1. Patient will identify two or more emotions that lead to a relapse for them 2. Patient will identify two emotions that result when they relapse 3. Patient will identify two emotions related to recovery 4. Patient will demonstrate ability to communicate their needs through discussion and/or role plays   Summary of Patient Progress: x    Therapeutic Modalities:   Cognitive Behavioral Therapy Solution-Focused Therapy Assertiveness Training Relapse Prevention Therapy   Evalina Field, MSW, LCSW Clinical Social Work 09/03/2018 12:42 PM

## 2018-09-03 NOTE — BHH Suicide Risk Assessment (Signed)
[place] INPATIENT:  Family/Significant Other Suicide Prevention Education  Suicide Prevention Education:  Patient Refusal for Family/Significant Other Suicide Prevention Education: The patient Julie Lambert has refused to provide written consent for family/significant other to be provided Family/Significant Other Suicide Prevention Education during admission and/or prior to discharge.  Physician notified.  Govanni Plemons T Carnita Golob 09/03/2018, 10:36 AM

## 2018-09-04 LAB — GLUCOSE, CAPILLARY
Glucose-Capillary: 222 mg/dL — ABNORMAL HIGH (ref 70–99)
Glucose-Capillary: 138 mg/dL — ABNORMAL HIGH (ref 70–99)
Glucose-Capillary: 134 mg/dL — ABNORMAL HIGH (ref 70–99)
Glucose-Capillary: 134 mg/dL — ABNORMAL HIGH (ref 70–99)

## 2018-09-04 MED ORDER — INSULIN GLARGINE 100 UNIT/ML ~~LOC~~ SOLN
20.0000 [IU] | Freq: Every day | SUBCUTANEOUS | Status: DC
  Administered 2018-09-04 – 2018-09-05 (×2): 20 [IU] via SUBCUTANEOUS
  Filled 2018-09-04 (×3): qty 0.2

## 2018-09-04 MED ORDER — DULOXETINE HCL 20 MG PO CPEP
40.0000 mg | ORAL_CAPSULE | Freq: Every day | ORAL | Status: DC
  Administered 2018-09-05: 40 mg via ORAL
  Filled 2018-09-04: qty 2

## 2018-09-04 NOTE — Progress Notes (Signed)
[facility] MD Progress Note  09/04/2018 9:12 AM Julie Lambert  MRN:  [id number] Subjective: Patient is a 53 year old female with a past psychiatric history significant for bipolar disorder as well as substance abuse who presented to the [facility] emergency department on 09/02/2018 with suicidal ideation.  Objective: Patient is seen and examined.  Patient is a 53 year old female with the above-stated past psychiatric history who is seen in follow-up.  She stated she felt better today.  Originally we were going to increase her Celexa, but she did not feel as though it was beneficial.  Because of her chronic pain issues I decided to switch her to Cymbalta.  She stated she is feeling a bit better today.  She stated she is not suicidal.  She stated that she needs to get out of the hospital to be able to go to work on Monday.  She stated that she did not have as many crying spells yesterday as previously.  She denied any suicidal ideation.  She denied any side effects to her current medications.  Her vital signs are stable, she is afebrile.  She slept 7.5 hours last night.  She was started on duloxetine 30 mg p.o. daily.  Her blood sugar this morning was 222.  Principal Problem: Bipolar 2 disorder ([place]) Diagnosis: Active Problems:   Major depression, recurrent ([place])  Total Time spent with patient: 15 minutes  Past Psychiatric History: See admission H&P  Past Medical History:  Past Medical History:  Diagnosis Date  . Bipolar 2 disorder ([place])   . Diabetes mellitus without complication ([place])   . Diverticulitis   . Diverticulosis   . GERD (gastroesophageal reflux disease)   . Sciatica   . Substance abuse ([place])   . Uterine fibroid     Past Surgical History:  Procedure Laterality Date  . BACK SURGERY    . CHOLECYSTECTOMY    . LUMBAR SPINE SURGERY     Family History:  Family History  Problem Relation Age of Onset  . CAD Mother   . Diabetes Mother   . Heart disease Mother    . Parkinson's disease Mother   . Kidney disease Mother   . Cancer Father        prostate cancer  . Breast cancer Maternal Aunt   . Breast cancer Maternal Grandmother    Family Psychiatric  History: See admission H&P Social History:  Social History   Substance and Sexual Activity  Alcohol Use Not Currently   Comment: last use 7/22 3 40oz      Social History   Substance and Sexual Activity  Drug Use Not Currently  . Types: "Crack" cocaine   Comment: last use 7/22, smoked crack    Social History   Socioeconomic History  . Marital status: Divorced    Spouse name: Not on file  . Number of children: Not on file  . Years of education: Not on file  . Highest education level: Not on file  Occupational History  . Not on file  Social Needs  . Financial resource strain: Not on file  . Food insecurity    Worry: Not on file    Inability: Not on file  . Transportation needs    Medical: Not on file    Non-medical: Not on file  Tobacco Use  . Smoking status: Current Every Day Smoker    Packs/day: 0.25    Years: 40.00    Pack years: 10.00    Types: Cigarettes  .  Smokeless tobacco: Never Used  Substance and Sexual Activity  . Alcohol use: Not Currently    Comment: last use 7/22 3 40oz   . Drug use: Not Currently    Types: "Crack" cocaine    Comment: last use 7/22, smoked crack  . Sexual activity: Yes  Lifestyle  . Physical activity    Days per week: Not on file    Minutes per session: Not on file  . Stress: Not on file  Relationships  . Social Herbalist on phone: Not on file    Gets together: Not on file    Attends religious service: Not on file    Active member of club or organization: Not on file    Attends meetings of clubs or organizations: Not on file    Relationship status: Not on file  Other Topics Concern  . Not on file  Social History Narrative  . Not on file   Additional Social History:                         Sleep:  Good  Appetite:  Good  Current Medications: Current Facility-Administered Medications  Medication Dose Route Frequency Provider Last Rate Last Dose  . acetaminophen (TYLENOL) tablet 650 mg  650 mg Oral Q6H PRN Sharma Covert, MD      . alum & mag hydroxide-simeth (MAALOX/MYLANTA) 200-200-20 MG/5ML suspension 30 mL  30 mL Oral Q4H PRN Sharma Covert, MD      . atorvastatin (LIPITOR) tablet 10 mg  10 mg Oral q1800 Sharma Covert, MD   10 mg at 09/03/18 1704  . chlordiazePOXIDE (LIBRIUM) capsule 25 mg  25 mg Oral TID PRN Sharma Covert, MD      . cyclobenzaprine (FLEXERIL) tablet 5 mg  5 mg Oral TID PRN Sharma Covert, MD      . DULoxetine (CYMBALTA) DR capsule 30 mg  30 mg Oral Daily Sharma Covert, MD   30 mg at 09/04/18 0755  . folic acid (FOLVITE) tablet 1 mg  1 mg Oral Daily Sharma Covert, MD   1 mg at 09/04/18 0754  . gabapentin (NEURONTIN) capsule 300 mg  300 mg Oral TID Sharma Covert, MD   300 mg at 09/04/18 0755  . hydrOXYzine (ATARAX/VISTARIL) tablet 25 mg  25 mg Oral TID PRN Sharma Covert, MD   25 mg at 09/03/18 2123  . ibuprofen (ADVIL) tablet 600 mg  600 mg Oral Q6H PRN Sharma Covert, MD   600 mg at 09/04/18 0759  . insulin glargine (LANTUS) injection 15 Units  15 Units Subcutaneous QHS Sharma Covert, MD   15 Units at 09/03/18 2141  . magnesium hydroxide (MILK OF MAGNESIA) suspension 30 mL  30 mL Oral Daily PRN Sharma Covert, MD      . metFORMIN (GLUCOPHAGE) tablet 1,000 mg  1,000 mg Oral BID WC Sharma Covert, MD   1,000 mg at 09/04/18 0754  . nicotine (NICODERM CQ - dosed in mg/24 hours) patch 21 mg  21 mg Transdermal Daily Sharma Covert, MD   21 mg at 09/04/18 0801  . pantoprazole (PROTONIX) EC tablet 40 mg  40 mg Oral Daily Sharma Covert, MD   40 mg at 09/04/18 0757  . thiamine (VITAMIN B-1) tablet 100 mg  100 mg Oral Daily Sharma Covert, MD   100 mg at 09/04/18 0754  . traZODone (DESYREL) tablet  150 mg  150  mg Oral QHS Lamont Dowdy, NP   150 mg at 09/03/18 2118    Lab Results:  Results for orders placed or performed during the hospital encounter of 09/02/18 (from the past 48 hour(s))  Glucose, capillary     Status: Abnormal   Collection Time: 09/03/18  7:05 AM  Result Value Ref Range   Glucose-Capillary 168 (H) 70 - 99 mg/dL  Glucose, capillary     Status: Abnormal   Collection Time: 09/03/18 12:03 PM  Result Value Ref Range   Glucose-Capillary 187 (H) 70 - 99 mg/dL  Glucose, capillary     Status: Abnormal   Collection Time: 09/03/18  4:36 PM  Result Value Ref Range   Glucose-Capillary 120 (H) 70 - 99 mg/dL  Glucose, capillary     Status: Abnormal   Collection Time: 09/04/18  7:15 AM  Result Value Ref Range   Glucose-Capillary 222 (H) 70 - 99 mg/dL    Blood Alcohol level:  Lab Results  Component Value Date   ETH 113 (H) 09/02/2018   ETH <10 09/23/4816    Metabolic Disorder Labs: Lab Results  Component Value Date   HGBA1C 8.2 (H) 03/19/2018   MPG 188.64 03/19/2018   MPG 312.05 12/26/2017   No results found for: PROLACTIN Lab Results  Component Value Date   CHOL 126 03/19/2018   TRIG 120 03/19/2018   HDL 63 03/19/2018   CHOLHDL 2.0 03/19/2018   VLDL 24 03/19/2018   LDLCALC 39 03/19/2018   LDLCALC 114 (H) 12/26/2017    Physical Findings: AIMS: Facial and Oral Movements Muscles of Facial Expression: None, normal Lips and Perioral Area: None, normal Jaw: None, normal Tongue: None, normal,Extremity Movements Upper (arms, wrists, hands, fingers): None, normal Lower (legs, knees, ankles, toes): None, normal, Trunk Movements Neck, shoulders, hips: None, normal, Overall Severity Severity of abnormal movements (highest score from questions above): None, normal Incapacitation due to abnormal movements: None, normal Patient's awareness of abnormal movements (rate only patient's report): No Awareness, Dental Status Current problems with teeth and/or dentures?:  No Does patient usually wear dentures?: No  CIWA:  CIWA-Ar Total: 14 COWS:     Musculoskeletal: Strength & Muscle Tone: within normal limits Gait & Station: normal Patient leans: N/A  Psychiatric Specialty Exam: Physical Exam  Nursing note and vitals reviewed. Constitutional: She is oriented to person, place, and time. She appears well-developed and well-nourished.  HENT:  Head: Normocephalic and atraumatic.  Respiratory: Effort normal.  Neurological: She is alert and oriented to person, place, and time.    ROS  Blood pressure 122/84, pulse 87, temperature 97.9 F (36.6 C), temperature source Oral, resp. rate 18, height 5\' 7"  (1.702 m), weight 81.6 kg, last menstrual period 07/22/2016, SpO2 100 %.Body mass index is 28.18 kg/m.  General Appearance: Casual  Eye Contact:  Good  Speech:  Normal Rate  Volume:  Normal  Mood:  Depressed  Affect:  Congruent  Thought Process:  Coherent and Descriptions of Associations: Intact  Orientation:  Full (Time, Place, and Person)  Thought Content:  Logical  Suicidal Thoughts:  No  Homicidal Thoughts:  No  Memory:  Immediate;   Fair Recent;   Fair Remote;   Fair  Judgement:  Intact  Insight:  Fair  Psychomotor Activity:  Normal  Concentration:  Concentration: Fair and Attention Span: Fair  Recall:  [employer] of Knowledge:  Fair  Language:  Fair  Akathisia:  Negative  Handed:  Right  AIMS (if indicated):  Assets:  Desire for Improvement Resilience  ADL's:  Intact  Cognition:  WNL  Sleep:  Number of Hours: 7.5     Treatment Plan Summary: Daily contact with patient to assess and evaluate symptoms and progress in treatment, Medication management and Plan : Patient is seen and examined.  Patient is a 53 year old female with the above-stated past psychiatric history who is seen in follow-up.   Diagnosis: #1 bipolar disorder; most recently depressed versus major depression; recurrent, #2 cocaine dependence, #3 chronic pain, #4  alcohol dependence, #5 diabetes mellitus type 2, #6 GERD  Patient is seen in follow-up.  She is doing better today.  She is less tearful.  She denied any suicidal ideation.  She was started on Cymbalta 30 mg a day yesterday.  I will increase her dosage up to 40 mg p.o. daily for tomorrow.  Her blood sugar is still elevated, and I am going to increase her Lantus insulin 20 units subcu nightly.  No other changes in her current medications. 1.  Continue Lipitor 10 mg p.o. daily for hyperlipidemia. 2.  Continue Librium 25 mg p.o. 3 times daily as needed anxiety or withdrawal with a CIWA greater than 10. 3.  Continue Flexeril 5 mg p.o. 3 times daily as needed muscle spasms or pain. 4.  Increase duloxetine to 40 mg p.o. daily for depression and anxiety. 5.  Continue folic acid 1 mg p.o. daily for nutritional supplementation. 6.  Continue gabapentin 300 mg p.o. 3 times daily for mood stability as well as chronic pain. 7.  Continue hydroxyzine 25 mg p.o. 3 times daily as needed anxiety. 8.  Continue ibuprofen 600 mg p.o. every 6 hours as needed pain or headache. 9.  Increase Lantus insulin to 20 units subcu nightly for diabetes. 10.  Continue Metformin at thousand milligrams p.o. twice daily for diabetes. 11.  Continue Protonix 40 mg p.o. daily for GERD. 12.  Continue thiamine 100 mg p.o. daily for nutritional supplementation. 13.  Continue trazodone 150 mg p.o. nightly for insomnia. 14.  Disposition planning-in progress.  Sharma Covert, MD 09/04/2018, 9:12 AM

## 2018-09-04 NOTE — Plan of Care (Signed)
Pt rates depression 7/10 and anxiety 10/10. Pt denies SI, HI and AVH. Pt has a goal to comply with her medication regimen. Pt was educated on care plan and verbalizes understanding. Collier Bullock RN Problem: Education: Goal: Knowledge of [place] [facility] Education information/materials will improve Outcome: Progressing Goal: Verbalization of understanding the information provided will improve Outcome: Progressing   Problem: Safety: Goal: Periods of time without injury will increase Outcome: Progressing   Problem: Education: Goal: Utilization of techniques to improve thought processes will improve Outcome: Progressing Goal: Knowledge of the prescribed therapeutic regimen will improve Outcome: Progressing   Problem: Safety: Goal: Ability to disclose and discuss suicidal ideas will improve Outcome: Progressing

## 2018-09-04 NOTE — [facility] Group Notes (Signed)
LCSW Group Therapy Note   09/04/2018 1:15pm   Type of Therapy and Topic:  Group Therapy:  Trust and Honesty  Participation Level:  Active  Description of Group:    In this group patients will be asked to explore the value of being honest.  Patients will be guided to discuss their thoughts, feelings, and behaviors related to honesty and trusting in others. Patients will process together how trust and honesty relate to forming relationships with peers, family members, and self. Each patient will be challenged to identify and express feelings of being vulnerable. Patients will discuss reasons why people are dishonest and identify alternative outcomes if one was truthful (to self or others). This group will be process-oriented, with patients participating in exploration of their own experiences, giving and receiving support, and processing challenge from other group members.   Therapeutic Goals: 1. Patient will identify why honesty is important to relationships and how honesty overall affects relationships.  2. Patient will identify a situation where they lied or were lied too and the  feelings, thought process, and behaviors surrounding the situation 3. Patient will identify the meaning of being vulnerable, how that feels, and how that correlates to being honest with self and others. 4. Patient will identify situations where they could have told the truth, but instead lied and explain reasons of dishonesty.   Summary of Patient Progress The patient was able to explore the value of being honest.  Patient discussed thoughts, feelings, and behaviors related to honesty and trusting in others. The patient processed together with other group members how trust and honesty relate to forming relationships with peers, family members, and self. Pt actively and appropriately engaged in the group. Patient was able to provide support and validation to other group members. Patient practiced active listening when  interacting with the facilitator and other group members.    Therapeutic Modalities:   Cognitive Behavioral Therapy Solution Focused Therapy Motivational Interviewing Brief Therapy  Julie Lambert  CUEBAS-COLON, LCSW 09/04/2018 3:57 PM

## 2018-09-04 NOTE — Plan of Care (Signed)
Stayed in the milieu until bedtime. Cooperative and compliant with treatment. Worried about her job and asking if she can be discharged. Was encouraged to discuss it with MD. CBG 134 at bedtime.

## 2018-09-05 LAB — GLUCOSE, CAPILLARY
Glucose-Capillary: 125 mg/dL — ABNORMAL HIGH (ref 70–99)
Glucose-Capillary: 239 mg/dL — ABNORMAL HIGH (ref 70–99)
Glucose-Capillary: 113 mg/dL — ABNORMAL HIGH (ref 70–99)
Glucose-Capillary: 199 mg/dL — ABNORMAL HIGH (ref 70–99)

## 2018-09-05 MED ORDER — GABAPENTIN 400 MG PO CAPS
400.0000 mg | ORAL_CAPSULE | Freq: Three times a day (TID) | ORAL | Status: DC
  Administered 2018-09-05 – 2018-09-06 (×4): 400 mg via ORAL
  Filled 2018-09-05 (×4): qty 1

## 2018-09-05 MED ORDER — DULOXETINE HCL 30 MG PO CPEP
60.0000 mg | ORAL_CAPSULE | Freq: Every day | ORAL | Status: DC
  Administered 2018-09-06: 60 mg via ORAL
  Filled 2018-09-05: qty 2

## 2018-09-05 NOTE — Plan of Care (Signed)
In and out of room. Cooperative and compliant with treatment. Continues to complain of lower back pain and taking available medications. Frequently talking about her employment saying that she wants to be discharged so that she wouldn't lose the job. Pleasant during conversation. No additional concern.

## 2018-09-05 NOTE — Progress Notes (Signed)
Patient slept all night. Got up for vital signs and returned to her room. Currently in bed, awake, pleasant upon approach. Staff continue to monitor for safety and other needs.

## 2018-09-05 NOTE — Plan of Care (Signed)
D- Patient alert and oriented. Patient presents in a pleasant mood on assessment stating that she didn't sleep well last night because she was "restless". Patient also endorsed lower back pain, rating it a "10/10", and she did request medication from this Probation officer. Patient denies any signs/symptoms of depression/anxiety. Patient also denies SI, HI, AVH at this time. Patient's goal for today is "being able to get refills on all meds", in which she will "speak to doctor" in order to accomplish her goal.  A- Scheduled medications administered to patient, per MD orders. Support and encouragement provided.  Routine safety checks conducted every 15 minutes.  Patient informed to notify staff with problems or concerns.  R- No adverse drug reactions noted. Patient contracts for safety at this time. Patient compliant with medications and treatment plan. Patient receptive, calm, and cooperative. Patient interacts well with others on the unit.  Patient remains safe at this time.  Problem: Education: Goal: Knowledge of [place] [facility] Education information/materials will improve Outcome: Progressing Goal: Emotional status will improve Outcome: Progressing Goal: Mental status will improve Outcome: Progressing Goal: Verbalization of understanding the information provided will improve Outcome: Progressing   Problem: Safety: Goal: Periods of time without injury will increase Outcome: Progressing   Problem: Education: Goal: Utilization of techniques to improve thought processes will improve Outcome: Progressing Goal: Knowledge of the prescribed therapeutic regimen will improve Outcome: Progressing   Problem: Safety: Goal: Ability to disclose and discuss suicidal ideas will improve Outcome: Progressing

## 2018-09-05 NOTE — Progress Notes (Signed)
Patient is continuing to complain of back pain with no resolve from medication. This Probation officer used the package for an ice pack and put warm water inside for patient to use at her discretion, to try and get some relief.

## 2018-09-05 NOTE — [facility] Group Notes (Signed)
LCSW Group Therapy Note 09/05/2018 1:15pm  Type of Therapy and Topic: Group Therapy: Feelings Around Returning Home & Establishing a Supportive Framework and Supporting Oneself When Supports Not Available  Participation Level: Did Not Attend  Description of Group:  Patients first processed thoughts and feelings about upcoming discharge. These included fears of upcoming changes, lack of change, new living environments, judgements and expectations from others and overall stigma of mental health issues. The group then discussed the definition of a supportive framework, what that looks and feels like, and how do to discern it from an unhealthy non-supportive network. The group identified different types of supports as well as what to do when your family/friends are less than helpful or unavailable  Therapeutic Goals  1. Patient will identify one healthy supportive network that they can use at discharge. 2. Patient will identify one factor of a supportive framework and how to tell it from an unhealthy network. 3. Patient able to identify one coping skill to use when they do not have positive supports from others. 4. Patient will demonstrate ability to communicate their needs through discussion and/or role plays.  Summary of Patient Progress:  Pt was invited to attend group but chose not to attend. CSW will continue to encourage pt to attend group throughout their admission.   Therapeutic Modalities Cognitive Behavioral Therapy Motivational Interviewing   Julie Lambert  CUEBAS-COLON, LCSW 09/05/2018 9:11 AM

## 2018-09-05 NOTE — Progress Notes (Signed)
[facility] MD Progress Note  09/05/2018 10:07 AM Julie Lambert  MRN:  [id number] Subjective:  Patient is a 53 year old female with a past psychiatric history significant for bipolar disorder as well as substance abuse who presented to the [facility] emergency department on 09/02/2018 with suicidal ideation.  Objective: Patient is seen and examined.  Patient is a 53 year old female with the above-stated past psychiatric history who is seen in follow-up.  Her only complaint today is that her back is bothering her.  She does not like the hospital bed.  She stated her mood was improving, and she did not have any problems with increased dose of the duloxetine to 40 mg a day.  She denied any side effects to the duloxetine.  She denied any suicidal ideation.  Her vital signs are stable although her diastolic pressures a mildly elevated today at 99.  She is afebrile.  She slept 6.75 hours last night.  Her blood sugar this morning is improved at 125.  Principal Problem: Bipolar 2 disorder ([place]) Diagnosis: Active Problems:   Major depression, recurrent ([place])  Total Time spent with patient: 15 minutes  Past Psychiatric History: See admission H&P  Past Medical History:  Past Medical History:  Diagnosis Date  . Bipolar 2 disorder ([place])   . Diabetes mellitus without complication ([place])   . Diverticulitis   . Diverticulosis   . GERD (gastroesophageal reflux disease)   . Sciatica   . Substance abuse ([place])   . Uterine fibroid     Past Surgical History:  Procedure Laterality Date  . BACK SURGERY    . CHOLECYSTECTOMY    . LUMBAR SPINE SURGERY     Family History:  Family History  Problem Relation Age of Onset  . CAD Mother   . Diabetes Mother   . Heart disease Mother   . Parkinson's disease Mother   . Kidney disease Mother   . Cancer Father        prostate cancer  . Breast cancer Maternal Aunt   . Breast cancer Maternal Grandmother    Family Psychiatric  History: See  admission H&P Social History:  Social History   Substance and Sexual Activity  Alcohol Use Not Currently   Comment: last use 7/22 3 40oz      Social History   Substance and Sexual Activity  Drug Use Not Currently  . Types: "Crack" cocaine   Comment: last use 7/22, smoked crack    Social History   Socioeconomic History  . Marital status: Divorced    Spouse name: Not on file  . Number of children: Not on file  . Years of education: Not on file  . Highest education level: Not on file  Occupational History  . Not on file  Social Needs  . Financial resource strain: Not on file  . Food insecurity    Worry: Not on file    Inability: Not on file  . Transportation needs    Medical: Not on file    Non-medical: Not on file  Tobacco Use  . Smoking status: Current Every Day Smoker    Packs/day: 0.25    Years: 40.00    Pack years: 10.00    Types: Cigarettes  . Smokeless tobacco: Never Used  Substance and Sexual Activity  . Alcohol use: Not Currently    Comment: last use 7/22 3 40oz   . Drug use: Not Currently    Types: "Crack" cocaine    Comment: last use 7/22, smoked  crack  . Sexual activity: Yes  Lifestyle  . Physical activity    Days per week: Not on file    Minutes per session: Not on file  . Stress: Not on file  Relationships  . Social Herbalist on phone: Not on file    Gets together: Not on file    Attends religious service: Not on file    Active member of club or organization: Not on file    Attends meetings of clubs or organizations: Not on file    Relationship status: Not on file  Other Topics Concern  . Not on file  Social History Narrative  . Not on file   Additional Social History:                         Sleep: Good  Appetite:  Good  Current Medications: Current Facility-Administered Medications  Medication Dose Route Frequency Provider Last Rate Last Dose  . acetaminophen (TYLENOL) tablet 650 mg  650 mg Oral Q6H PRN  Sharma Covert, MD   650 mg at 09/04/18 1616  . alum & mag hydroxide-simeth (MAALOX/MYLANTA) 200-200-20 MG/5ML suspension 30 mL  30 mL Oral Q4H PRN Sharma Covert, MD      . atorvastatin (LIPITOR) tablet 10 mg  10 mg Oral q1800 Sharma Covert, MD   10 mg at 09/04/18 1615  . chlordiazePOXIDE (LIBRIUM) capsule 25 mg  25 mg Oral TID PRN Sharma Covert, MD      . cyclobenzaprine (FLEXERIL) tablet 5 mg  5 mg Oral TID PRN Sharma Covert, MD   5 mg at 09/05/18 0858  . DULoxetine (CYMBALTA) DR capsule 40 mg  40 mg Oral Daily Sharma Covert, MD   40 mg at 09/05/18 0858  . folic acid (FOLVITE) tablet 1 mg  1 mg Oral Daily Sharma Covert, MD   1 mg at 09/05/18 0858  . gabapentin (NEURONTIN) capsule 300 mg  300 mg Oral TID Sharma Covert, MD   300 mg at 09/05/18 0858  . hydrOXYzine (ATARAX/VISTARIL) tablet 25 mg  25 mg Oral TID PRN Sharma Covert, MD   25 mg at 09/05/18 0858  . ibuprofen (ADVIL) tablet 600 mg  600 mg Oral Q6H PRN Sharma Covert, MD   600 mg at 09/04/18 2139  . insulin glargine (LANTUS) injection 20 Units  20 Units Subcutaneous QHS Sharma Covert, MD   20 Units at 09/04/18 2143  . magnesium hydroxide (MILK OF MAGNESIA) suspension 30 mL  30 mL Oral Daily PRN Sharma Covert, MD      . metFORMIN (GLUCOPHAGE) tablet 1,000 mg  1,000 mg Oral BID WC Sharma Covert, MD   1,000 mg at 09/05/18 0858  . nicotine (NICODERM CQ - dosed in mg/24 hours) patch 21 mg  21 mg Transdermal Daily Sharma Covert, MD   21 mg at 09/05/18 0857  . pantoprazole (PROTONIX) EC tablet 40 mg  40 mg Oral Daily Sharma Covert, MD   40 mg at 09/05/18 0858  . thiamine (VITAMIN B-1) tablet 100 mg  100 mg Oral Daily Sharma Covert, MD   100 mg at 09/05/18 0858  . traZODone (DESYREL) tablet 150 mg  150 mg Oral QHS Lamont Dowdy, NP   150 mg at 09/04/18 2139    Lab Results:  Results for orders placed or performed during the hospital encounter of 09/02/18 (from the  past  48 hour(s))  Glucose, capillary     Status: Abnormal   Collection Time: 09/03/18 12:03 PM  Result Value Ref Range   Glucose-Capillary 187 (H) 70 - 99 mg/dL  Glucose, capillary     Status: Abnormal   Collection Time: 09/03/18  4:36 PM  Result Value Ref Range   Glucose-Capillary 120 (H) 70 - 99 mg/dL  Glucose, capillary     Status: Abnormal   Collection Time: 09/04/18  7:15 AM  Result Value Ref Range   Glucose-Capillary 222 (H) 70 - 99 mg/dL  Glucose, capillary     Status: Abnormal   Collection Time: 09/04/18 11:37 AM  Result Value Ref Range   Glucose-Capillary 138 (H) 70 - 99 mg/dL  Glucose, capillary     Status: Abnormal   Collection Time: 09/04/18  4:13 PM  Result Value Ref Range   Glucose-Capillary 134 (H) 70 - 99 mg/dL  Glucose, capillary     Status: Abnormal   Collection Time: 09/04/18  9:42 PM  Result Value Ref Range   Glucose-Capillary 134 (H) 70 - 99 mg/dL  Glucose, capillary     Status: Abnormal   Collection Time: 09/05/18  7:02 AM  Result Value Ref Range   Glucose-Capillary 125 (H) 70 - 99 mg/dL   Comment 1 Notify RN     Blood Alcohol level:  Lab Results  Component Value Date   ETH 113 (H) 09/02/2018   ETH <10 73/53/2992    Metabolic Disorder Labs: Lab Results  Component Value Date   HGBA1C 8.2 (H) 03/19/2018   MPG 188.64 03/19/2018   MPG 312.05 12/26/2017   No results found for: PROLACTIN Lab Results  Component Value Date   CHOL 126 03/19/2018   TRIG 120 03/19/2018   HDL 63 03/19/2018   CHOLHDL 2.0 03/19/2018   VLDL 24 03/19/2018   LDLCALC 39 03/19/2018   LDLCALC 114 (H) 12/26/2017    Physical Findings: AIMS: Facial and Oral Movements Muscles of Facial Expression: None, normal Lips and Perioral Area: None, normal Jaw: None, normal Tongue: None, normal,Extremity Movements Upper (arms, wrists, hands, fingers): None, normal Lower (legs, knees, ankles, toes): None, normal, Trunk Movements Neck, shoulders, hips: None, normal, Overall  Severity Severity of abnormal movements (highest score from questions above): None, normal Incapacitation due to abnormal movements: None, normal Patient's awareness of abnormal movements (rate only patient's report): No Awareness, Dental Status Current problems with teeth and/or dentures?: No Does patient usually wear dentures?: No  CIWA:  CIWA-Ar Total: 14 COWS:     Musculoskeletal: Strength & Muscle Tone: within normal limits Gait & Station: normal Patient leans: N/A  Psychiatric Specialty Exam: Physical Exam  Nursing note and vitals reviewed. Constitutional: She is oriented to person, place, and time. She appears well-developed and well-nourished.  HENT:  Head: Normocephalic and atraumatic.  Respiratory: Effort normal.  Neurological: She is alert and oriented to person, place, and time.    ROS  Blood pressure (!) 115/99, pulse 86, temperature 98.5 F (36.9 C), temperature source Oral, resp. rate 18, height 5\' 7"  (1.702 m), weight 81.6 kg, last menstrual period 07/22/2016, SpO2 100 %.Body mass index is 28.18 kg/m.  General Appearance: Casual  Eye Contact:  Fair  Speech:  Normal Rate  Volume:  Normal  Mood:  Anxious  Affect:  Congruent  Thought Process:  Coherent and Descriptions of Associations: Intact  Orientation:  Full (Time, Place, and Person)  Thought Content:  Logical  Suicidal Thoughts:  No  Homicidal Thoughts:  No  Memory:  Immediate;   Fair Recent;   Fair Remote;   Fair  Judgement:  Fair  Insight:  Fair  Psychomotor Activity:  Normal  Concentration:  Concentration: Fair and Attention Span: Fair  Recall:  [employer] of Knowledge:  Fair  Language:  Fair  Akathisia:  Negative  Handed:  Right  AIMS (if indicated):     Assets:  Desire for Improvement Resilience  ADL's:  Intact  Cognition:  WNL  Sleep:  Number of Hours: 6.75     Treatment Plan Summary: Daily contact with patient to assess and evaluate symptoms and progress in treatment, Medication  management and Plan : Patient is seen and examined.  Patient is a 53 year old female with the above-stated past psychiatric history who is seen in follow-up.  Diagnosis: #1 bipolar disorder; most recently depressed versus major depression; recurrent, #2 cocaine dependence, #3 chronic pain, #4 alcohol dependence, #5 diabetes mellitus type 2, #6 GERD  Patient is seen in follow-up.  She continues to slowly improve.  She is less tearful.  Her main complaint today is her back pain.  I am going to increase her duloxetine to 60 mg p.o. daily.  I am going to increase her gabapentin to 400 mg p.o. 3 times daily for mood stability as well as chronic pain.  Her blood sugar is doing much better.  Her diastolic pressure is a bit up but systolic pressure is fine.  No other changes in her medications at this time.  She is hoping to be able to be discharged tomorrow so she can get back to work on Tuesday. 1.  Continue Lipitor 10 mg p.o. daily for hyperlipidemia. 2.  Continue Librium 25 mg p.o. 3 times daily as needed anxiety or withdrawal with a CIWA greater than 10. 3.  Continue Flexeril 5 mg p.o. 3 times daily as needed muscle spasms or pain. 4.  Increase duloxetine to 60 mg p.o. daily for depression and anxiety. 5.  Continue folic acid 1 mg p.o. daily for nutritional supplementation. 6.  Increase gabapentin to 400 mg p.o. 3 times daily for mood stability as well as chronic pain. 7.  Continue hydroxyzine 25 mg p.o. 3 times daily as needed anxiety. 8.  Continue ibuprofen 600 mg p.o. every 6 hours as needed pain or headache. 9.  Continue Lantus insulin to 20 units subcu nightly for diabetes. 10.  Continue Metformin at thousand milligrams p.o. twice daily for diabetes. 11.  Continue Protonix 40 mg p.o. daily for GERD. 12.  Continue thiamine 100 mg p.o. daily for nutritional supplementation. 13.  Continue trazodone 150 mg p.o. nightly for insomnia. 14.  Disposition planning-in progress. Sharma Covert,  MD 09/05/2018, 10:07 AM

## 2018-09-06 DIAGNOSIS — F332 Major depressive disorder, recurrent severe without psychotic features: Secondary | ICD-10-CM

## 2018-09-06 LAB — GLUCOSE, CAPILLARY
Glucose-Capillary: 169 mg/dL — ABNORMAL HIGH (ref 70–99)
Glucose-Capillary: 129 mg/dL — ABNORMAL HIGH (ref 70–99)

## 2018-09-06 MED ORDER — GABAPENTIN 400 MG PO CAPS: 400.0000 mg | ORAL_CAPSULE | Freq: Three times a day (TID) | ORAL | 1 refills | Status: DC

## 2018-09-06 MED ORDER — IBUPROFEN 600 MG PO TABS: 600.0000 mg | ORAL_TABLET | Freq: Four times a day (QID) | ORAL | 1 refills | Status: DC | PRN

## 2018-09-06 MED ORDER — ATORVASTATIN CALCIUM 10 MG PO TABS: 10.0000 mg | ORAL_TABLET | Freq: Every day | ORAL | 1 refills | Status: DC

## 2018-09-06 MED ORDER — METFORMIN HCL 1000 MG PO TABS: 1000.0000 mg | ORAL_TABLET | Freq: Two times a day (BID) | ORAL | 1 refills | Status: DC

## 2018-09-06 MED ORDER — INSULIN GLARGINE 100 UNIT/ML ~~LOC~~ SOLN: 20.0000 [IU] | Freq: Every day | SUBCUTANEOUS | 1 refills | Status: DC

## 2018-09-06 MED ORDER — CYCLOBENZAPRINE HCL 10 MG PO TABS: 5.0000 mg | ORAL_TABLET | Freq: Three times a day (TID) | ORAL | Status: DC | PRN

## 2018-09-06 MED ORDER — DULOXETINE HCL 60 MG PO CPEP: 60.0000 mg | ORAL_CAPSULE | Freq: Every day | ORAL | 1 refills | Status: DC

## 2018-09-06 MED ORDER — TRAZODONE HCL 150 MG PO TABS: 150.0000 mg | ORAL_TABLET | Freq: Every day | ORAL | 1 refills | Status: DC

## 2018-09-06 MED ORDER — CYCLOBENZAPRINE HCL 5 MG PO TABS: 5.0000 mg | ORAL_TABLET | Freq: Three times a day (TID) | ORAL | 1 refills | Status: DC | PRN

## 2018-09-06 MED ORDER — PANTOPRAZOLE SODIUM 40 MG PO TBEC: 40.0000 mg | DELAYED_RELEASE_TABLET | Freq: Every day | ORAL | 1 refills | Status: DC

## 2018-09-06 NOTE — Discharge Summary (Signed)
Physician Discharge Summary Note  Patient:  Julie Lambert. [place] is an 53 y.o., female MRN:  [id number] DOB:  09/23/1965 Patient phone:  [phone] (home)  Patient address:   [address] [place] [address],  Total Time spent with patient: 45 minutes  Date of Admission:  09/02/2018 Date of Discharge: September 06, 2018  Reason for Admission: Admitted because of depressed mood substance abuse return of some suicidal ideation.  Principal Problem: Major depression, recurrent ([place]) Discharge Diagnoses: Principal Problem:   Major depression, recurrent ([place]) Active Problems:   Diabetes mellitus without complication ([place])   Cocaine use disorder, moderate, dependence ([place])   Suicidal ideation   Past Psychiatric History: Past history of depression and substance use problems  Past Medical History:  Past Medical History:  Diagnosis Date  . Bipolar 2 disorder ([place])   . Diabetes mellitus without complication ([place])   . Diverticulitis   . Diverticulosis   . GERD (gastroesophageal reflux disease)   . Sciatica   . Substance abuse ([place])   . Uterine fibroid     Past Surgical History:  Procedure Laterality Date  . BACK SURGERY    . CHOLECYSTECTOMY    . LUMBAR SPINE SURGERY     Family History:  Family History  Problem Relation Age of Onset  . CAD Mother   . Diabetes Mother   . Heart disease Mother   . Parkinson's disease Mother   . Kidney disease Mother   . Cancer Father        prostate cancer  . Breast cancer Maternal Aunt   . Breast cancer Maternal Grandmother    Family Psychiatric  History: See previous Social History:  Social History   Substance and Sexual Activity  Alcohol Use Not Currently   Comment: last use 7/22 3 40oz      Social History   Substance and Sexual Activity  Drug Use Not Currently  . Types: "Crack" cocaine   Comment: last use 7/22, smoked crack    Social History   Socioeconomic History  . Marital status: Divorced    Spouse name: Not on file  .  Number of children: Not on file  . Years of education: Not on file  . Highest education level: Not on file  Occupational History  . Not on file  Social Needs  . Financial resource strain: Not on file  . Food insecurity    Worry: Not on file    Inability: Not on file  . Transportation needs    Medical: Not on file    Non-medical: Not on file  Tobacco Use  . Smoking status: Current Every Day Smoker    Packs/day: 0.25    Years: 40.00    Pack years: 10.00    Types: Cigarettes  . Smokeless tobacco: Never Used  Substance and Sexual Activity  . Alcohol use: Not Currently    Comment: last use 7/22 3 40oz   . Drug use: Not Currently    Types: "Crack" cocaine    Comment: last use 7/22, smoked crack  . Sexual activity: Yes  Lifestyle  . Physical activity    Days per week: Not on file    Minutes per session: Not on file  . Stress: Not on file  Relationships  . Social Herbalist on phone: Not on file    Gets together: Not on file    Attends religious service: Not on file    Active member of club or organization: Not on  file    Attends meetings of clubs or organizations: Not on file    Relationship status: Not on file  Other Topics Concern  . Not on file  Social History Narrative  . Not on file    Hospital Course: Patient admitted to the psychiatric hospital.  15-minute checks maintained.  Engaged in appropriate groups and individual therapy.  Showed no dangerous suicidal or violent behavior in the hospital.  Antidepressant medication was changed slightly and was started on Cymbalta.  Some adjustment to treatment of diabetes.  Patient was counseled about the dangers of ongoing substance abuse.  She was given psychoeducation about treatment of depression and encouraged to make sure she has appropriate follow-up after discharge  Physical Findings: AIMS: Facial and Oral Movements Muscles of Facial Expression: None, normal Lips and Perioral Area: None, normal Jaw: None,  normal Tongue: None, normal,Extremity Movements Upper (arms, wrists, hands, fingers): None, normal Lower (legs, knees, ankles, toes): None, normal, Trunk Movements Neck, shoulders, hips: None, normal, Overall Severity Severity of abnormal movements (highest score from questions above): None, normal Incapacitation due to abnormal movements: None, normal Patient's awareness of abnormal movements (rate only patient's report): No Awareness, Dental Status Current problems with teeth and/or dentures?: No Does patient usually wear dentures?: No  CIWA:  CIWA-Ar Total: 14 COWS:     Musculoskeletal: Strength & Muscle Tone: within normal limits Gait & Station: normal Patient leans: N/A  Psychiatric Specialty Exam: Physical Exam  Nursing note and vitals reviewed. Constitutional: She appears well-developed and well-nourished.  HENT:  Head: Normocephalic and atraumatic.  Eyes: Pupils are equal, round, and reactive to light. Conjunctivae are normal.  Neck: Normal range of motion.  Cardiovascular: Regular rhythm and normal heart sounds.  Respiratory: Effort normal.  GI: Soft.  Musculoskeletal: Normal range of motion.  Neurological: She is alert.  Skin: Skin is warm and dry.  Psychiatric: She has a normal mood and affect. Her behavior is normal. Judgment and thought content normal.    Review of Systems  Constitutional: Negative.   HENT: Negative.   Eyes: Negative.   Respiratory: Negative.   Cardiovascular: Negative.   Gastrointestinal: Negative.   Musculoskeletal: Negative.   Skin: Negative.   Neurological: Negative.   Psychiatric/Behavioral: Negative.     Blood pressure 124/79, pulse 95, temperature 97.9 F (36.6 C), temperature source Oral, resp. rate 18, height 5\' 7"  (1.702 m), weight 81.6 kg, last menstrual period 07/22/2016, SpO2 100 %.Body mass index is 28.18 kg/m.  General Appearance: Casual  Eye Contact:  Good  Speech:  Clear and Coherent  Volume:  Normal  Mood:  Euthymic   Affect:  Congruent  Thought Process:  Goal Directed  Orientation:  Full (Time, Place, and Person)  Thought Content:  Logical  Suicidal Thoughts:  No  Homicidal Thoughts:  No  Memory:  Immediate;   Fair Recent;   Fair Remote;   Fair  Judgement:  Fair  Insight:  Fair  Psychomotor Activity:  Normal  Concentration:  Concentration: Fair  Recall:  [employer] of Knowledge:  Fair  Language:  Fair  Akathisia:  No  Handed:  Right  AIMS (if indicated):     Assets:  Desire for Improvement Housing Physical Health  ADL's:  Intact  Cognition:  WNL  Sleep:  Number of Hours: 7        Has this patient used any form of tobacco in the last 30 days? (Cigarettes, Smokeless Tobacco, Cigars, and/or Pipes) Yes, No  Blood Alcohol level:  Lab  Results  Component Value Date   ETH 113 (H) 09/02/2018   ETH <10 85/27/7824    Metabolic Disorder Labs:  Lab Results  Component Value Date   HGBA1C 8.2 (H) 03/19/2018   MPG 188.64 03/19/2018   MPG 312.05 12/26/2017   No results found for: PROLACTIN Lab Results  Component Value Date   CHOL 126 03/19/2018   TRIG 120 03/19/2018   HDL 63 03/19/2018   CHOLHDL 2.0 03/19/2018   VLDL 24 03/19/2018   LDLCALC 39 03/19/2018   LDLCALC 114 (H) 12/26/2017    See Psychiatric Specialty Exam and Suicide Risk Assessment completed by Attending Physician prior to discharge.  Discharge destination:  Home  Is patient on multiple antipsychotic therapies at discharge:  no   Do you recommend tapering to monotherapy for antipsychotics?  No   Has Patient had three or more failed trials of antipsychotic monotherapy by history:  No  Recommended Plan for Multiple Antipsychotic Therapies: NA  Discharge Instructions    Diet - low sodium heart healthy   Complete by: As directed    Increase activity slowly   Complete by: As directed      Allergies as of 09/06/2018      Reactions   Ivp Dye [iodinated Diagnostic Agents] Itching   Patient stated she had itching  on her back after IV injection      Medication List    STOP taking these medications   citalopram 20 MG tablet Commonly known as: CELEXA   hydrOXYzine 25 MG tablet Commonly known as: ATARAX/VISTARIL   Insulin Glargine 100 UNIT/ML Solostar Pen Commonly known as: Lantus SoloStar Replaced by: insulin glargine 100 UNIT/ML injection   naproxen 500 MG tablet Commonly known as: NAPROSYN   omeprazole 40 MG capsule Commonly known as: PRILOSEC     TAKE these medications     Indication  atorvastatin 10 MG tablet Commonly known as: LIPITOR Take 1 tablet (10 mg total) by mouth daily at 6 PM. What changed: when to take this  Indication: High Amount of Fats in the Blood   cyclobenzaprine 5 MG tablet Commonly known as: FLEXERIL Take 1 tablet (5 mg total) by mouth 3 (three) times daily as needed for muscle spasms.  Indication: Muscle Spasm   DULoxetine 60 MG capsule Commonly known as: CYMBALTA Take 1 capsule (60 mg total) by mouth daily. Start taking on: September 07, 2018  Indication: Major Depressive Disorder   gabapentin 400 MG capsule Commonly known as: NEURONTIN Take 1 capsule (400 mg total) by mouth 3 (three) times daily. What changed:   medication strength  how much to take  Indication: Fibromyalgia Syndrome   ibuprofen 600 MG tablet Commonly known as: ADVIL Take 1 tablet (600 mg total) by mouth every 6 (six) hours as needed.  Indication: Pain   insulin glargine 100 UNIT/ML injection Commonly known as: LANTUS Inject 0.2 mLs (20 Units total) into the skin at bedtime. Replaces: Insulin Glargine 100 UNIT/ML Solostar Pen  Indication: Type 2 Diabetes   metFORMIN 1000 MG tablet Commonly known as: GLUCOPHAGE Take 1 tablet (1,000 mg total) by mouth 2 (two) times daily with a meal.  Indication: Type 2 Diabetes   pantoprazole 40 MG tablet Commonly known as: PROTONIX Take 1 tablet (40 mg total) by mouth daily. Start taking on: September 07, 2018  Indication: Gastroesophageal  Reflux Disease   traZODone 150 MG tablet Commonly known as: DESYREL Take 1 tablet (150 mg total) by mouth at bedtime. What changed:   when to  take this  reasons to take this  Indication: [place] on 09/10/2018.   Why: You are scheduled to [facility] at [facility] on Friday, September 10, 2018 at 230pm. Please bring photo ID and hospital discharge paperwork. Thank you. Contact information: [place] [address] [phone]           Follow-up recommendations:  Activity:  Activity as tolerated Diet:  Diabetic diet Other:  Follow-up with [facility] for substance abuse and mental health treatment  Comments: Prescriptions for medications with a refill given at discharge  Signed: Alethia Berthold, MD 09/06/2018, 5:48 PM

## 2018-09-06 NOTE — BHH Suicide Risk Assessment (Signed)
[facility] Discharge Suicide Risk Assessment   Principal Problem: Bipolar 2 disorder [facility]) Discharge Diagnoses: Active Problems:   Major depression, recurrent ([place])   Total Time spent with patient: 30 minutes  Musculoskeletal: Strength & Muscle Tone: within normal limits Gait & Station: normal Patient leans: N/A  Psychiatric Specialty Exam: Review of Systems  Constitutional: Negative.   HENT: Negative.   Eyes: Negative.   Respiratory: Negative.   Cardiovascular: Negative.   Gastrointestinal: Negative.   Musculoskeletal: Negative.   Skin: Negative.   Neurological: Negative.   Psychiatric/Behavioral: Negative for depression, hallucinations, memory loss, substance abuse and suicidal ideas. The patient is not nervous/anxious and does not have insomnia.     Blood pressure 124/79, pulse 95, temperature 97.9 F (36.6 C), temperature source Oral, resp. rate 18, height 5\' 7"  (1.702 m), weight 81.6 kg, last menstrual period 07/22/2016, SpO2 100 %.Body mass index is 28.18 kg/m.  General Appearance: Casual  Eye Contact::  Good  Speech:  Normal Rate409  Volume:  Normal  Mood:  Anxious  Affect:  Appropriate  Thought Process:  Goal Directed  Orientation:  Full (Time, Place, and Person)  Thought Content:  Logical  Suicidal Thoughts:  No  Homicidal Thoughts:  No  Memory:  Immediate;   Fair Recent;   Fair Remote;   Fair  Judgement:  Fair  Insight:  Fair  Psychomotor Activity:  Normal  Concentration:  Fair  Recall:  [employer] of Knowledge:Fair  Language: Fair  Akathisia:  No  Handed:  Right  AIMS (if indicated):     Assets:  Desire for Improvement Housing Physical Health Social Support  Sleep:  Number of Hours: 7  Cognition: WNL  ADL's:  Intact   Mental Status Per Nursing Assessment::   On Admission:  NA  Demographic Factors:  NA  Loss Factors: Financial problems/change in socioeconomic status  Historical Factors: Impulsivity  Risk Reduction Factors:   Religious  beliefs about death  Continued Clinical Symptoms:  Depression:   Impulsivity Alcohol/Substance Abuse/Dependencies  Cognitive Features That Contribute To Risk:  None    Suicide Risk:  Minimal: No identifiable suicidal ideation.  Patients presenting with no risk factors but with morbid ruminations; may be classified as minimal risk based on the severity of the depressive symptoms  Follow-up Information    Rafael Capo on 09/10/2018.   Why: You are scheduled to follow up at [facility] on Friday, September 10, 2018 at 230pm. Please bring photo ID and hospital discharge paperwork. Thank you. Contact information: [place] [address] [phone]           Plan Of Care/Follow-up recommendations:  Activity:  Activity as tolerated Diet:  Regular diet Other:  Follow-up with outpatient treatment through [facility] for mental health and substance abuse treatment  Alethia Berthold, MD 09/06/2018, 12:56 PM

## 2018-09-06 NOTE — Progress Notes (Signed)
  [facility] Adult Case Management Discharge Plan :  Will you be returning to the same living situation after discharge:  Yes,  home At discharge, do you have transportation home?: Yes,  taxi Do you have the ability to pay for your medications: Yes,  mental health  Release of information consent forms completed and in the chart;    Patient to Follow up at: Follow-up Information    [place] on 09/10/2018.   Why: You are scheduled to follow up at [facility] on Friday, September 10, 2018 at 230pm. Please bring photo ID and hospital discharge paperwork. Thank you. Contact information: [place] [address] [phone]           Next level of care provider has access to [place] and Suicide Prevention discussed: Yes,  SPE completed with pt as pt declined collateral contact     Has patient been referred to the Quitline?: Patient refused referral  Patient has been referred for addiction treatment: Pt. refused referral  Delfin Edis, LCSW 09/06/2018, 1:13 PM

## 2018-09-06 NOTE — Progress Notes (Signed)
Patient ID: Julie Lambert, female   DOB: 1965/07/01, 53 y.o.   MRN: [id number]   Discharge Note:  Patient denies SI/HI/AVH at this time. Discharge instructions, AVS, prescriptions, and transition record gone over with patient. Patient agrees to comply with medication management, follow-up visit, and outpatient therapy. Patient belongings returned to patient. Patient questions and concerns addressed and answered. Patient ambulatory off unit. Patient discharged to home via [facility] services.

## 2018-09-06 NOTE — [facility] Group Notes (Signed)

## 2018-09-06 NOTE — Progress Notes (Signed)
Patient slept throughout he night. Got up in the morning for vital signs then stayed in the [place]. Alert and oriented. Denying hallucinations and suicidal thoughts and reports that the only concern is "that pain". Currently in the dayroom with peers, cooperative. Safety monitored

## 2018-09-09 ENCOUNTER — Other Ambulatory Visit: Payer: Self-pay | Admitting: Psychiatry

## 2018-09-09 MED ORDER — INSULIN GLARGINE 100 UNIT/ML ~~LOC~~ SOLN: 20.0000 [IU] | Freq: Every day | SUBCUTANEOUS | 1 refills | Status: DC

## 2018-09-13 ENCOUNTER — Other Ambulatory Visit: Payer: Self-pay

## 2018-09-13 ENCOUNTER — Ambulatory Visit: Payer: Self-pay | Admitting: Pharmacy Technician

## 2018-09-13 ENCOUNTER — Telehealth: Payer: Self-pay | Admitting: Pharmacy Technician

## 2018-09-13 DIAGNOSIS — Z79899 Other long term (current) drug therapy: Secondary | ICD-10-CM

## 2018-09-13 NOTE — Telephone Encounter (Signed)
Discovered after eligibility appointment that patient has active policy with [employer] that will become effective 09/14/18.  Contacted patient explained that she no longer met [facility]'s eligibility criteria.  [facility]

## 2018-09-13 NOTE — Progress Notes (Signed)
Completed financial assistance application for [place] due to recent hospital visit. Patient agreed to be responsible for gathering financial information and forwarding to appropriate department in [facility].    Patient verbally agreed to all terms of the [facility] contract.   Patient approved to receive medication assistance at [facility] as long as eligibility criteria continues to be met.    Provided patient with community resource material based on her particular needs.    Referring patient for [facility].  Referring patient for [facility].  [facility]

## 2018-10-10 ENCOUNTER — Emergency Department
Admission: EM | Admit: 2018-10-10 | Discharge: 2018-10-11 | Disposition: A | Discharge status: Home / Self Care | Payer: BC Managed Care – PPO | Attending: Emergency Medicine | Admitting: Emergency Medicine

## 2018-10-10 ENCOUNTER — Encounter: Payer: Self-pay | Admitting: Emergency Medicine

## 2018-10-10 ENCOUNTER — Other Ambulatory Visit: Payer: Self-pay

## 2018-10-10 DIAGNOSIS — F329 Major depressive disorder, single episode, unspecified: Secondary | ICD-10-CM | POA: Diagnosis present

## 2018-10-10 DIAGNOSIS — Z794 Long term (current) use of insulin: Secondary | ICD-10-CM | POA: Insufficient documentation

## 2018-10-10 DIAGNOSIS — E119 Type 2 diabetes mellitus without complications: Secondary | ICD-10-CM | POA: Diagnosis not present

## 2018-10-10 DIAGNOSIS — F1414 Cocaine abuse with cocaine-induced mood disorder: Secondary | ICD-10-CM | POA: Diagnosis not present

## 2018-10-10 DIAGNOSIS — F17210 Nicotine dependence, cigarettes, uncomplicated: Secondary | ICD-10-CM | POA: Diagnosis not present

## 2018-10-10 DIAGNOSIS — R45851 Suicidal ideations: Secondary | ICD-10-CM | POA: Diagnosis not present

## 2018-10-10 DIAGNOSIS — F1910 Other psychoactive substance abuse, uncomplicated: Secondary | ICD-10-CM | POA: Diagnosis not present

## 2018-10-10 DIAGNOSIS — Z79899 Other long term (current) drug therapy: Secondary | ICD-10-CM | POA: Diagnosis not present

## 2018-10-10 LAB — COMPREHENSIVE METABOLIC PANEL
ALT: 19 U/L (ref 0–44)
AST: 22 U/L (ref 15–41)
Albumin: 3.9 g/dL (ref 3.5–5.0)
Alkaline Phosphatase: 95 U/L (ref 38–126)
Anion gap: 12 (ref 5–15)
BUN: 10 mg/dL (ref 6–20)
CO2: 20 mmol/L — ABNORMAL LOW (ref 22–32)
Calcium: 8.8 mg/dL — ABNORMAL LOW (ref 8.9–10.3)
Chloride: 108 mmol/L (ref 98–111)
Creatinine, Ser: 0.62 mg/dL (ref 0.44–1.00)
GFR calc Af Amer: >60 mL/min (ref >60)
GFR calc non Af Amer: >60 mL/min (ref >60)
Glucose, Bld: 188 mg/dL — ABNORMAL HIGH (ref 70–99)
Potassium: 3.5 mmol/L (ref 3.5–5.1)
Sodium: 140 mmol/L (ref 135–145)
Total Bilirubin: 0.6 mg/dL (ref 0.3–1.2)
Total Protein: 7.7 g/dL (ref 6.5–8.1)

## 2018-10-10 LAB — CBC
HCT: 38.2 % (ref 36.0–46.0)
Hemoglobin: 13.1 g/dL (ref 12.0–15.0)
MCH: 29.8 pg (ref 26.0–34.0)
MCHC: 34.3 g/dL (ref 30.0–36.0)
MCV: 86.8 fL (ref 80.0–100.0)
Platelets: 258 10*3/uL (ref 150–400)
RBC: 4.4 MIL/uL (ref 3.87–5.11)
RDW: 12.5 % (ref 11.5–15.5)
WBC: 8.3 10*3/uL (ref 4.0–10.5)
nRBC: 0 % (ref 0.0–0.2)

## 2018-10-10 LAB — ETHANOL: Alcohol, Ethyl (B): 84 mg/dL — ABNORMAL HIGH (ref <10)

## 2018-10-10 LAB — GLUCOSE, CAPILLARY: Glucose-Capillary: 125 mg/dL — ABNORMAL HIGH (ref 70–99)

## 2018-10-10 LAB — SALICYLATE LEVEL: Salicylate Lvl: <7 mg/dL (ref 2.8–30.0)

## 2018-10-10 LAB — ACETAMINOPHEN LEVEL: Acetaminophen (Tylenol), Serum: <10 ug/mL — ABNORMAL LOW (ref 10–30)

## 2018-10-10 MED ORDER — INSULIN GLARGINE 100 UNIT/ML ~~LOC~~ SOLN
20.0000 [IU] | Freq: Every day | SUBCUTANEOUS | Status: DC
  Administered 2018-10-10: 20 [IU] via SUBCUTANEOUS
  Filled 2018-10-10 (×2): qty 0.2

## 2018-10-10 MED ORDER — ACETAMINOPHEN 325 MG PO TABS
650.0000 mg | ORAL_TABLET | Freq: Once | ORAL | Status: AC
  Administered 2018-10-10: 16:00:00 650 mg via ORAL

## 2018-10-10 MED ORDER — ATORVASTATIN CALCIUM 10 MG PO TABS: 10.0000 mg | ORAL_TABLET | Freq: Every day | ORAL | Status: DC

## 2018-10-10 MED ORDER — GABAPENTIN 300 MG PO CAPS
300.0000 mg | ORAL_CAPSULE | Freq: Three times a day (TID) | ORAL | Status: DC
  Administered 2018-10-10: 16:00:00 300 mg via ORAL
  Filled 2018-10-10: qty 1

## 2018-10-10 MED ORDER — TRAZODONE HCL 50 MG PO TABS
150.0000 mg | ORAL_TABLET | Freq: Every day | ORAL | Status: DC
  Administered 2018-10-10: 150 mg via ORAL
  Filled 2018-10-10: qty 1

## 2018-10-10 MED ORDER — LORAZEPAM 1 MG PO TABS
1.0000 mg | ORAL_TABLET | Freq: Once | ORAL | Status: AC
  Administered 2018-10-10: 14:00:00 1 mg via ORAL
  Filled 2018-10-10: qty 1

## 2018-10-10 MED ORDER — METFORMIN HCL 500 MG PO TABS: 1000.0000 mg | ORAL_TABLET | Freq: Two times a day (BID) | ORAL | Status: DC

## 2018-10-10 MED ORDER — PANTOPRAZOLE SODIUM 40 MG PO TBEC
40.0000 mg | DELAYED_RELEASE_TABLET | Freq: Every day | ORAL | Status: DC
  Administered 2018-10-10: 40 mg via ORAL
  Filled 2018-10-10: qty 1

## 2018-10-10 MED ORDER — DULOXETINE HCL 60 MG PO CPEP
60.0000 mg | ORAL_CAPSULE | Freq: Every day | ORAL | Status: DC
  Administered 2018-10-10: 20:00:00 60 mg via ORAL
  Filled 2018-10-10: qty 1

## 2018-10-10 MED ORDER — GABAPENTIN 300 MG PO CAPS
400.0000 mg | ORAL_CAPSULE | Freq: Three times a day (TID) | ORAL | Status: DC
  Administered 2018-10-10: 400 mg via ORAL
  Filled 2018-10-10: qty 1

## 2018-10-10 MED ORDER — ACETAMINOPHEN 325 MG PO TABS
ORAL_TABLET | ORAL | Status: AC
  Administered 2018-10-10: 650 mg via ORAL
  Filled 2018-10-10: qty 2

## 2018-10-10 MED ORDER — IBUPROFEN 600 MG PO TABS
600.0000 mg | ORAL_TABLET | Freq: Four times a day (QID) | ORAL | Status: DC | PRN
  Administered 2018-10-10: 600 mg via ORAL
  Filled 2018-10-10: qty 1

## 2018-10-10 NOTE — ED Triage Notes (Signed)
Pt via pov from home; states "someone gave me something." Pt is rocking back and forth and crying in triage, stating she relapsed and "there is something wrong with me." Pt reports use of crack this 3 hours PTA, thinks it was laced with something. PT alert & oriented.

## 2018-10-10 NOTE — ED Notes (Signed)
This tech gave pt 2 cranberry juice.

## 2018-10-10 NOTE — ED Notes (Signed)
Pt asleep at this time

## 2018-10-10 NOTE — ED Notes (Signed)
Report to include Situation, Background, Assessment, and Recommendations received from Jadeka RN. Patient alert and oriented, warm and dry, in no acute distress. Patient denies SI, HI, AVH and pain. Patient made aware of Q15 minute rounds and security cameras for their safety. Patient instructed to come to me with needs or concerns. 

## 2018-10-10 NOTE — ED Notes (Signed)
Patient talking to psych NP Reita Cliche and TTS Kerry Dory

## 2018-10-10 NOTE — ED Notes (Signed)
Hourly rounding reveals patient sleeping in room. No complaints, stable, in no acute distress. Q15 minute rounds and monitoring via Security Cameras to continue. 

## 2018-10-10 NOTE — ED Notes (Signed)
Patient talking with MD KP

## 2018-10-10 NOTE — ED Notes (Signed)
Pt resting in room in bed.

## 2018-10-10 NOTE — ED Notes (Signed)
Transferred to room BHU 5 by this tech and security guard [place].

## 2018-10-10 NOTE — Consult Note (Signed)
Julie Lambert   Reason for Lambert:  Cocaine abuse and depression/anxiety Referring Physician:  EDP Patient Identification: Julie Lambert MRN:  [id number] Principal Diagnosis: Cocaine abuse with cocaine-induced mood disorder ([place]) Diagnosis:  Principal Problem:   Cocaine abuse with cocaine-induced mood disorder ([place])   Total Time spent with patient: 1 hour  Subjective:   Julie Lambert is a 53 y.o. female patient presented to the ED thinking she got some bad drugs.  On assessment, she reports she was using drugs until about 6 am and started not feeling right, came to the ED.  No suicidal/homicidal ideations, hallucinations, or withdrawal symptoms.    Patient seen and evaluated in the ED by this provider in person.  She was sleepy as she reports she was drinking and using drugs until early this morning, 6 am.  Recently left the inpatient admission and reports taking her medications but has not followed up at [facility].  She is worried about the drugs she got, anxiety high.  Agreed to restart her medications and follow up with [facility] tomorrow when they are open, feels better being monitored at this time.  HPI per TTS:  53 y.o. female who presents to the ER because, "she wasn't feeling right." Patient admits to using cocaine prior to coming to the ER and not taking her medications. Patient reports of currently having SI with no plan, intentions or gestures.  Patient was recently inpatient with [facility] and discharged 09/06/2018. She was to follow up with [facility] for outpatient treatment.  Past Psychiatric History:   Risk to Self: Suicidal Ideation: Yes-Currently Present Suicidal Intent: No Is patient at risk for suicide?: No Suicidal Plan?: No Specify Current Suicidal Plan: Reports of none Access to Means: No What has been your use of drugs/alcohol within the last 12 months?: Cocaine How many times?: 0 Other Self Harm Risks: Active Drug use Triggers for Past  Attempts: None known Intentional Self Injurious Behavior: None Risk to Others: Homicidal Ideation: No Thoughts of Harm to Others: No Current Homicidal Intent: No Current Homicidal Plan: No Access to Homicidal Means: No Identified Victim: Reports of none History of harm to others?: No Assessment of Violence: None Noted Violent Behavior Description: Reports of none Does patient have access to weapons?: No Criminal Charges Pending?: No Does patient have a court date: No Prior Inpatient Therapy: Prior Inpatient Therapy: Yes Prior Therapy Dates: Multiple Hospitalizations  Prior Therapy Facilty/Provider(s): [facility] Reason for Treatment: Depression/ SI Prior Outpatient Therapy: Prior Outpatient Therapy: Yes Prior Therapy Dates: Current Prior Therapy Facilty/Provider(s): [facility] Reason for Treatment: Depression/SI Does patient have an ACCT team?: No Does patient have Intensive In-House Services?  : No Does patient have [facility] services? : No Does patient have P4CC services?: No  Past Medical History:  Past Medical History:  Diagnosis Date  . Bipolar 2 disorder ([place])   . Diabetes mellitus without complication ([place])   . Diverticulitis   . Diverticulosis   . GERD (gastroesophageal reflux disease)   . Sciatica   . Substance abuse ([place])   . Uterine fibroid     Past Surgical History:  Procedure Laterality Date  . BACK SURGERY    . CHOLECYSTECTOMY    . LUMBAR SPINE SURGERY     Family History:  Family History  Problem Relation Age of Onset  . CAD Mother   . Diabetes Mother   . Heart disease Mother   . Parkinson's disease Mother   . Kidney disease Mother   . Cancer  Father        prostate cancer  . Breast cancer Maternal Aunt   . Breast cancer Maternal Grandmother    Family Psychiatric  History: see above  Social History:  Social History   Substance and Sexual Activity  Alcohol Use Not Currently   Comment: last use 7/22 3 40oz      Social History   Substance and Sexual  Activity  Drug Use Not Currently  . Types: "Crack" cocaine   Comment: last use 7/22, smoked crack    Social History   Socioeconomic History  . Marital status: Divorced    Spouse name: Not on file  . Number of children: Not on file  . Years of education: Not on file  . Highest education level: Not on file  Occupational History  . Not on file  Social Needs  . Financial resource strain: Not on file  . Food insecurity    Worry: Not on file    Inability: Not on file  . Transportation needs    Medical: Not on file    Non-medical: Not on file  Tobacco Use  . Smoking status: Current Every Day Smoker    Packs/day: 0.25    Years: 40.00    Pack years: 10.00    Types: Cigarettes  . Smokeless tobacco: Never Used  Substance and Sexual Activity  . Alcohol use: Not Currently    Comment: last use 7/22 3 40oz   . Drug use: Not Currently    Types: "Crack" cocaine    Comment: last use 7/22, smoked crack  . Sexual activity: Yes  Lifestyle  . Physical activity    Days per week: Not on file    Minutes per session: Not on file  . Stress: Not on file  Relationships  . Social Herbalist on phone: Not on file    Gets together: Not on file    Attends religious service: Not on file    Active member of club or organization: Not on file    Attends meetings of clubs or organizations: Not on file    Relationship status: Not on file  Other Topics Concern  . Not on file  Social History Narrative  . Not on file   Additional Social History:    Allergies:   Allergies  Allergen Reactions  . Ivp Dye [Iodinated Diagnostic Agents] Itching    Patient stated she had itching on her back after IV injection    Labs:  Results for orders placed or performed during the hospital encounter of 10/10/18 (from the past 48 hour(s))  Comprehensive metabolic panel     Status: Abnormal   Collection Time: 10/10/18 11:43 AM  Result Value Ref Range   Sodium 140 135 - 145 mmol/L   Potassium 3.5 3.5  - 5.1 mmol/L   Chloride 108 98 - 111 mmol/L   CO2 20 (L) 22 - 32 mmol/L   Glucose, Bld 188 (H) 70 - 99 mg/dL   BUN 10 6 - 20 mg/dL   Creatinine, Ser 0.62 0.44 - 1.00 mg/dL   Calcium 8.8 (L) 8.9 - 10.3 mg/dL   Total Protein 7.7 6.5 - 8.1 g/dL   Albumin 3.9 3.5 - 5.0 g/dL   AST 22 15 - 41 U/L   ALT 19 0 - 44 U/L   Alkaline Phosphatase 95 38 - 126 U/L   Total Bilirubin 0.6 0.3 - 1.2 mg/dL   GFR calc non Af Amer >60 >60 mL/min  GFR calc Af Amer >60 >60 mL/min   Anion gap 12 5 - 15    Comment: Performed at [facility], [address] [place], [place] [address]  Ethanol     Status: Abnormal   Collection Time: 10/10/18 11:43 AM  Result Value Ref Range   Alcohol, Ethyl (B) 84 (H) <10 mg/dL    Comment: (NOTE) Lowest detectable limit for serum alcohol is 10 mg/dL. For medical purposes only. Performed at [facility], [address] [place], [place] [id number]   Salicylate level     Status: None   Collection Time: 10/10/18 11:43 AM  Result Value Ref Range   Salicylate Lvl [id number] 2.8 - 30.0 mg/dL    Comment: Performed at [facility], [place], [place] [address]  Acetaminophen level     Status: Abnormal   Collection Time: 10/10/18 11:43 AM  Result Value Ref Range   Acetaminophen (Tylenol), Serum <10 (L) 10 - 30 ug/mL    Comment: (NOTE) Therapeutic concentrations vary significantly. A range of 10-30 ug/mL  may be an effective concentration for many patients. However, some  are best treated at concentrations outside of this range. Acetaminophen concentrations >150 ug/mL at 4 hours after ingestion  and >50 ug/mL at 12 hours after ingestion are often associated with  toxic reactions. Performed at [facility] [place], [address] [place], [place] [address]   cbc     Status: None   Collection Time: 10/10/18 11:43 AM  Result Value Ref Range   WBC 8.3 4.0 - 10.5 K/uL   RBC 4.40 3.87 - 5.11 MIL/uL   Hemoglobin 13.1 12.0 - 15.0 g/dL    HCT 38.2 36.0 - 46.0 %   MCV 86.8 80.0 - 100.0 fL   MCH 29.8 26.0 - 34.0 pg   MCHC 34.3 30.0 - 36.0 g/dL   RDW 12.5 11.5 - 15.5 %   Platelets 258 150 - 400 K/uL   nRBC 0.0 0.0 - 0.2 %    Comment: Performed at [facility], [address] [place], [place] [address]    Current Facility-Administered Medications  Medication Dose Route Frequency Provider Last Rate Last Dose  . gabapentin (NEURONTIN) capsule 300 mg  300 mg Oral TID Patrecia Pour, NP   300 mg at 10/10/18 1615   Current Outpatient Medications  Medication Sig Dispense Refill  . atorvastatin (LIPITOR) 10 MG tablet Take 1 tablet (10 mg total) by mouth daily at 6 PM. 30 tablet 1  . cyclobenzaprine (FLEXERIL) 5 MG tablet Take 1 tablet (5 mg total) by mouth 3 (three) times daily as needed for muscle spasms. 60 tablet 1  . DULoxetine (CYMBALTA) 60 MG capsule Take 1 capsule (60 mg total) by mouth daily. 30 capsule 1  . gabapentin (NEURONTIN) 400 MG capsule Take 1 capsule (400 mg total) by mouth 3 (three) times daily. 90 capsule 1  . ibuprofen (ADVIL) 600 MG tablet Take 1 tablet (600 mg total) by mouth every 6 (six) hours as needed. 60 tablet 1  . insulin glargine (LANTUS) 100 UNIT/ML injection Inject 0.2 mLs (20 Units total) into the skin at bedtime. 10 mL 1  . metFORMIN (GLUCOPHAGE) 1000 MG tablet Take 1 tablet (1,000 mg total) by mouth 2 (two) times daily with a meal. 60 tablet 1  . pantoprazole (PROTONIX) 40 MG tablet Take 1 tablet (40 mg total) by mouth daily. 30 tablet 1  . traZODone (DESYREL) 150 MG tablet Take 1 tablet (150 mg  total) by mouth at bedtime. 30 tablet 1    Musculoskeletal: Strength & Muscle Tone: within normal limits Gait & Station: normal Patient leans: N/A  Psychiatric Specialty Exam: Physical Exam  Nursing note reviewed. Constitutional: She is oriented to person, place, and time. She appears well-developed and well-nourished.  HENT:  Head: Normocephalic.  Neck: Normal range of motion.   Respiratory: Effort normal.  Musculoskeletal: Normal range of motion.  Neurological: She is alert and oriented to person, place, and time.  Psychiatric: Her speech is normal and behavior is normal. Judgment and thought content normal. Her mood appears anxious. Cognition and memory are impaired.    Review of Systems  Psychiatric/Behavioral: Positive for depression and substance abuse. The patient is nervous/anxious.   All other systems reviewed and are negative.   Blood pressure (!) 158/99, pulse 95, temperature 98.5 F (36.9 C), temperature source Oral, resp. rate 20, height 5\' 7"  (1.702 m), weight 81.6 kg, last menstrual period 07/22/2016, SpO2 99 %.Body mass index is 28.19 kg/m.  General Appearance: Disheveled  Eye Contact:  Fair  Speech:  Normal Rate  Volume:  Decreased  Mood:  Anxious and Depressed  Affect:  Congruent  Thought Process:  Coherent and Descriptions of Associations: Intact  Orientation:  Full (Time, Place, and Person)  Thought Content:  Rumination  Suicidal Thoughts:  No  Homicidal Thoughts:  No  Memory:  Immediate;   Fair Recent;   Fair Remote;   Fair  Judgement:  Impaired  Insight:  Fair  Psychomotor Activity:  Decreased  Concentration:  Concentration: Fair and Attention Span: Fair  Recall:  [employer] of Knowledge:  Fair  Language:  Good  Akathisia:  No  Handed:  Right  AIMS (if indicated):     Assets:  Leisure Time Physical Health Resilience  ADL's:  Intact  Cognition:  WNL  Sleep:        Treatment Plan Summary: Daily contact with patient to assess and evaluate symptoms and progress in treatment, Medication management and Plan cocaine abuse with cocaine induced mood disorder:  -Start gabapentin 400 mg TID -Follow up with [facility] tomorrow  Depression: -Restart Cymbalta 60 mg daily  Insomnia: -Restart Trazodone 150 mg at bedtime  Disposition: Supportive therapy provided about ongoing stressors.  Waylan Boga, NP 10/10/2018 6:50 PM

## 2018-10-10 NOTE — ED Notes (Signed)
Patient assigned to appropriate care area   Introduced self to pt  Patient oriented to unit/care area: Informed that, for their safety, care areas are designed for safety and visiting and phone hours explained to patient. Patient verbalizes understanding, and verbal contract for safety obtained  Environment secured  Patient said she's not feeling well, she relapsed on crack and has been having SI thoughts, just thoughts. Patient is very tearful and asked to have some medications to help her calm down

## 2018-10-10 NOTE — ED Provider Notes (Signed)
[facility] Emergency Department Provider Note  Time seen: 12:36 PM  I have reviewed the triage vital signs and the nursing notes.   HISTORY  Chief Complaint Suicidal   HPI Julie Lambert is a 53 y.o. female with a past medical history of bipolar, diabetes, gastric reflux, substance use, presents to the emergency department tearful, depression with SI.  According to the patient she is very tearful, she says she is having worsening depression and thoughts of hurting herself.  She thinks she "got bad drugs."  Patient admits to crack cocaine and alcohol use stopping around 6 AM this morning.  Denies any chest pain or abdominal pain cough congestion or fever.   Past Medical History:  Diagnosis Date  . Bipolar 2 disorder ([place])   . Diabetes mellitus without complication ([place])   . Diverticulitis   . Diverticulosis   . GERD (gastroesophageal reflux disease)   . Sciatica   . Substance abuse ([place])   . Uterine fibroid     Patient Active Problem List   Diagnosis Date Noted  . Major depression, recurrent ([place]) 09/03/2018  . Special screening for malignant neoplasms, colon 04/06/2018  . Liver hemangioma 03/01/2018  . Uncontrolled type 2 diabetes mellitus with hyperglycemia ([place]) 03/01/2018  . Tobacco use disorder 01/04/2018  . Suicidal ideation 01/04/2018  . Bipolar I disorder, most recent episode depressed, severe without psychotic features ([place]) 01/02/2018  . Diabetes mellitus without complication ([place]) 0000000  . Severe alcohol use disorder ([place]) 12/25/2017  . Cocaine use disorder, moderate, dependence ([place]) 12/25/2017    Past Surgical History:  Procedure Laterality Date  . BACK SURGERY    . CHOLECYSTECTOMY    . LUMBAR SPINE SURGERY      Prior to Admission medications   Medication Sig Start Date End Date Taking? Authorizing Provider  atorvastatin (LIPITOR) 10 MG tablet Take 1 tablet (10 mg total) by mouth daily at 6 PM. 09/06/18   Clapacs, [name],  MD  cyclobenzaprine (FLEXERIL) 5 MG tablet Take 1 tablet (5 mg total) by mouth 3 (three) times daily as needed for muscle spasms. 09/06/18   Clapacs, [name], MD  DULoxetine (CYMBALTA) 60 MG capsule Take 1 capsule (60 mg total) by mouth daily. 09/07/18   Clapacs, [name], MD  gabapentin (NEURONTIN) 400 MG capsule Take 1 capsule (400 mg total) by mouth 3 (three) times daily. 09/06/18   Clapacs, [name], MD  ibuprofen (ADVIL) 600 MG tablet Take 1 tablet (600 mg total) by mouth every 6 (six) hours as needed. 09/06/18   Clapacs, [name], MD  insulin glargine (LANTUS) 100 UNIT/ML injection Inject 0.2 mLs (20 Units total) into the skin at bedtime. 09/09/18   Clapacs, [name], MD  metFORMIN (GLUCOPHAGE) 1000 MG tablet Take 1 tablet (1,000 mg total) by mouth 2 (two) times daily with a meal. 09/06/18   Clapacs, [name], MD  pantoprazole (PROTONIX) 40 MG tablet Take 1 tablet (40 mg total) by mouth daily. 09/07/18   Clapacs, [name], MD  traZODone (DESYREL) 150 MG tablet Take 1 tablet (150 mg total) by mouth at bedtime. 09/06/18   Clapacs, [name], MD    Allergies  Allergen Reactions  . Ivp Dye [Iodinated Diagnostic Agents] Itching    Patient stated she had itching on her back after IV injection    Family History  Problem Relation Age of Onset  . CAD Mother   . Diabetes Mother   . Heart disease Mother   . Parkinson's  disease Mother   . Kidney disease Mother   . Cancer Father        prostate cancer  . Breast cancer Maternal Aunt   . Breast cancer Maternal Grandmother     Social History Social History   Tobacco Use  . Smoking status: Current Every Day Smoker    Packs/day: 0.25    Years: 40.00    Pack years: 10.00    Types: Cigarettes  . Smokeless tobacco: Never Used  Substance Use Topics  . Alcohol use: Not Currently    Comment: last use 7/22 3 40oz   . Drug use: Not Currently    Types: "Crack" cocaine    Comment: last use 7/22, smoked crack    Review of Systems Constitutional: Negative for  fever. Cardiovascular: Negative for chest pain. Respiratory: Negative for shortness of breath. Gastrointestinal: Negative for abdominal pain, vomiting Musculoskeletal: Negative for musculoskeletal complaints Neurological: Negative for headache All other ROS negative  ____________________________________________   PHYSICAL EXAM:  VITAL SIGNS: ED Triage Vitals [10/10/18 1137]  Enc Vitals Group     BP (!) 158/99     Pulse Rate 95     Resp 20     Temp 98.5 F (36.9 C)     Temp Source Oral     SpO2 99 %     Weight 180 lb (81.6 kg)     Height 5\' 7"  (1.702 m)     Head Circumference      Peak Flow      Pain Score 0     Pain Loc      Pain Edu?      Excl. in [place]?    Constitutional: Alert and oriented.  Tearful. Eyes: Normal exam ENT      Head: Normocephalic and atraumatic.      Mouth/Throat: Mucous membranes are moist. Cardiovascular: Normal rate, regular rhythm. No murmur Respiratory: Normal respiratory effort without tachypnea nor retractions. Breath sounds are clear  Gastrointestinal: Soft and nontender. No distention.   Musculoskeletal: Nontender with normal range of motion in all extremities. Neurologic:  Normal speech and language. No gross focal neurologic deficits  Skin:  Skin is warm, dry and intact.  Psychiatric: Tearful, SI ____________________________________________   INITIAL IMPRESSION / ASSESSMENT AND PLAN / ED COURSE  Pertinent labs & imaging results that were available during my care of the patient were reviewed by me and considered in my medical decision making (see chart for details).   Patient presents emergency department with recent substance use with worsening depression and suicidal ideation.  Given the patient suicidal ideation we will place under an involuntary commitment until the patient can be seen evaluate by psychiatry.  I discussed this with the patient she is agreeable.  Lab work shows an alcohol level of 84 otherwise nonrevealing.  Julie Lambert was evaluated in Emergency Department on 10/10/2018 for the symptoms described in the history of present illness. She was evaluated in the context of the global COVID-19 pandemic, which necessitated consideration that the patient might be at risk for infection with the SARS-CoV-2 virus that causes COVID-19. Institutional protocols and algorithms that pertain to the evaluation of patients at risk for COVID-19 are in a state of rapid change based on information released by regulatory bodies including the CDC and federal and state organizations. These policies and algorithms were followed during the patient's care in the ED.  ____________________________________________   FINAL CLINICAL IMPRESSION(S) / ED DIAGNOSES  Suicidal ideation Depression Substance abuse  Harvest Dark, MD 10/10/18 1244

## 2018-10-10 NOTE — ED Triage Notes (Signed)
Arrives tearful.  Placed in the center of triage for safety.

## 2018-10-10 NOTE — ED Notes (Addendum)
Pt changed out into burgundy scrubs; 2 belongings bags:   1of 2 includes flip flop sandals brown, black leggings, pink tshirt, pinkish bra, mint green colored barrel bag with toiletries  2 of 2 contains brown backpack style purse, which was not opened by this nurse. Pt states there is no money or anything of value in the purse. Pt added cell phone to purse as well as sterile cup containing 3 silver colored earrings, bracelet, silver colored cross necklace.
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
## 2018-10-10 NOTE — ED Notes (Signed)
IVC/Consult Pending/Moved to BHU-5 @ 1500

## 2018-10-10 NOTE — ED Notes (Signed)
Patient complained of a headache 7/10. Given Tylenol 650 mg PO

## 2018-10-10 NOTE — ED Notes (Signed)
This tech gave pt dinner tray.

## 2018-10-11 ENCOUNTER — Other Ambulatory Visit: Payer: Self-pay | Admitting: Pharmacist

## 2018-10-11 NOTE — ED Notes (Signed)
Hourly rounding reveals patient sleeping in room. No complaints, stable, in no acute distress. Q15 minute rounds and monitoring via Security Cameras to continue. 

## 2018-10-11 NOTE — ED Notes (Signed)
BEHAVIORAL HEALTH ROUNDING Patient sleeping: Yes.   Patient alert and oriented: eyes closed  Appears asleep Behavior appropriate: Yes.  ; If no, describe:  Nutrition and fluids offered: Yes  Toileting and hygiene offered: sleeping Sitter present: q 15 minute observations and security camera monitoring  

## 2018-10-11 NOTE — ED Notes (Signed)
Medications offered  - pt states  "I'll take them when I get home"

## 2018-10-11 NOTE — Discharge Instructions (Signed)
[facility] (Mental Health & Substance Use Services) & [facility]  Mental health service in [place], [place] Address: [address], [place],  [address] Hours:  Open ? Closes 5PM Phone: [phone] Substance Use Disorder and Mental Illness Substance use disorder is a condition in which a person is dependent on a substance, such as drugs or alcohol. A mental illness is a condition that occurs when someone experiences changes in mood, behavior, or thinking. Sometimes, these two conditions can occur at the same time (co-occurring disorders) and may be diagnosed together (dual diagnosis). What is the relationship between substance use disorder and mental illness? Substance use disorder and mental illness can share symptoms and can have similar causes, such as exposure to stress or changes in brain chemicals. The risk for developing both of these conditions can be passed from parent to child (inherited). People with mental illnesses sometimes use drugs to try and relieve symptoms, and this can lead to substance use disorder. Substance use disorders occur more often in people who have depression, schizophrenia, anxiety, or personality disorders. When some drugs are used regularly, they can cause people to have symptoms of mental illness. What are the signs or symptoms? Symptoms vary widely for substance use disorder and mental illness, especially because these conditions can be present at the same time. Signs of a substance use disorder may include:  Failure to meet responsibilities at home, work, or school.  Using substances in risky situation, such as while driving or using machinery.  Taking serious risks to get drugs or alcohol.  Spending less time on activities or hobbies that used to be important.  Changes in personality or attitude for no reason, such as angry outbursts, symptoms of anxiety, or  unusual giddiness.  Trying to hide the amount of drugs or alcohol used.  Increased substance use over time, or needing to use more of a substance to feel the same effects (developing a tolerance).  Sudden weight loss or gain.  Sleeping too much or too little.  Uncontrolled trembling or shaking (tremors), slurred speech, or lack of coordination.  Continuing to use alcohol or a drug even though using it has led to bad outcomes or consequences, such as losing a job or ending a relationship. Signs of mental illness may include:  Extreme mood changes (mood swings).  Sleeping too much or too little.  Weight loss or weight gain.  Being easily distracted.  Confused thinking or trouble concentrating.  Expressing thoughts of suicide.  Withdrawing from friends and family, or sudden changes in social behaviors or hobbies.  Aggression toward people and animals.  Repeatedly breaking serious rules or breaking the law.  Having persistent thoughts or urges that are unpleasant or feel out of control (involuntary). The person may feel the need to act on the urges in order to reduce anxiety.  Persistent feelings of sadness or hopelessness.  False beliefs (delusions).  Seeing, hearing, tasting, smelling, or feeling things that are not real (hallucinations). How is this diagnosed? Substance use disorder and mental illnesses can be difficult to diagnose at the same time because of how varied and complex the symptoms are. Because these conditions can interact, one condition may be missed. This is why it is important to be completely honest with your health care provider about substance use and your other symptoms. Diagnosing your condition may include:  A physical exam.  A review of your medical history and your symptoms. Your  health care provider may refer you to a mental health professional for a psychiatric evaluation. This may include assessments of:  Your use of substances.  Your risk  of suicide.  Your risk of aggressive behaviors.  Your lifestyle, environment, and social situations.  Your medical health.  Your mental health and behavioral history. How is this treated? It is best to treat substance use disorder and mental illness at the same time (integrated treatment approach). Treatment usually involves more than one of the following methods:  Detox. This refers to stopping substance abuse while being monitored by trained medical staff. This is usually the first step in treatment. Detox can last for up to 7 days.  Rehabilitation. This involves staying in a treatment center where you can have medical and mental health support all the time.  Medicines to relieve symptoms of mental illness and to control symptoms that are caused by stopping substance abuse (withdrawal symptoms).  Support groups. These groups encourage you to talk about your fears, frustrations, and anxieties with others who have the same condition.  Talk therapy. This is one-on-one therapy that can help you learn about your illness and learn ways to cope with symptoms or side effects. Follow these instructions at home: Lifestyle  Exercise regularly. Aim for 150 minutes of moderate exercise (such as walking or biking) or 75 minutes of vigorous exercise (such as running) each week.  Eat a healthy diet with plenty of fruits and vegetables, whole grains, and lean proteins.  Avoid caffeine and tobacco. These can worsen symptoms and anxiety.  Do not drink alcohol or use drugs.  Try to get 7-9 hours of sleep each night. To do this: ? Keep your bedroom cool and dark. ? Do not eat a heavy meal during the hour before you go to bed. ? Do not have caffeine before bedtime. ? Avoid screen time during the few hours before bedtime. This means not watching TV and not using a computer, cell phone, or tablet. Medicines  Take over-the-counter and prescription medicines only as told by your health care  provider.  Do not stop taking medicines unless you ask your health care provider if it is safe to do that.  Tell your health care provider about any medicine side effects that you experience. General instructions  Follow your treatment plan as directed. Work with your health care provider to adjust your treatment plan as needed.  Attend support group or therapy sessions as directed.  Explain your diagnosis to your friends and family. Let them know what your symptoms are and what things cause your symptoms to start (triggers).  Avoid triggers or stressors that may worsen your symptoms or cause you to use alcohol or drugs. Spend time with family and friends who do not use substances.  Make time to relax and do self-soothing activities, such as meditating or listening to music.  Keep all follow-up visits as told by your health care provider and therapist. This is important. Where to find support You may find support for coping with substance use disorder and mental illness from:  Your health care providers or your therapist. These providers can treat you or they can help you find services to treat your condition.  Local support groups for people with your condition. Your health care provider or therapist may be able to recommend a support group. This may be a hospital support group, a [employer] on [place] support group, or a 12-step group such as Alcoholics Anonymous (AA) or Narcotics  Anonymous (NA).  Family and friends. Let them know what they can do to best support you through your recovery process. Where to find more information You may find more information about substance use disorder and mental illness from:  Substance Abuse and [facility]): ? Online: ktimeonline.com ? [employer], to talk with a person who can help you find information about treatment services in your area: [phone])  [place].S[place]  Department of Health and Financial controller mental health services: http://greene.com/  National Alliance on Mental Illness (NAMI): www.nami.Accord on Alcoholism and Drug Dependence: www.ncadd.org Contact a health care provider if:  Your symptoms get worse or they do not get better.  You have negative side effects from taking medicines.  You want to discuss stopping medicines or treatment.  You start using a substance again (have a relapse). Get help right away if:  You have thoughts about harming yourself or others. If you ever feel like you may hurt yourself or others, or have thoughts about taking your own life, get help right away. You can go to your nearest emergency department or call:  Your local emergency services (911 in the [place]  A suicide crisis helpline, such as the [place] at 219 075 1277. This is open 24 hours a day. Summary  Substance use disorder and mental illness can occur at the same time (co-occurring disorders), and they may be diagnosed together (dual diagnosis).  These conditions can be difficult to diagnose at the same time. It is important to be completely honest with your health care provider about your substance use and your other symptoms.  It is best to treat substance use disorder and mental illness at the same time (integrated treatment approach).  Identifying new ways to deal with triggers and difficult situations is an important part of recovery.  Keep all follow-up visits as told by your health care provider and therapist. Be consistent when attending support groups or treatment programs. This information is not intended to replace advice given to you by your health care provider. Make sure you discuss any questions you have with your health care provider. Document Released: 06/13/2016 Document Revised: 03/25/2018 Document Reviewed: 06/13/2016 Elsevier Patient Education  [place].

## 2018-10-11 NOTE — ED Notes (Signed)

## 2018-10-11 NOTE — ED Provider Notes (Signed)
-----------------------------------------   6:14 AM on 10/11/2018 -----------------------------------------   Blood pressure (!) 172/85, pulse 72, temperature 98.5 F (36.9 C), temperature source Oral, resp. rate 18, height 5\' 7"  (1.702 m), weight 81.6 kg, last menstrual period 07/22/2016, SpO2 100 %.  The patient is calm and cooperative at this time.  There have been no acute events since the last update.  Awaiting disposition plan from Behavioral Medicine and/or Social Work team(s).   Paulette Blanch, MD 10/11/18 [phone]

## 2018-10-11 NOTE — ED Notes (Signed)
ED BHU [place] Is the patient under IVC or is there intent for IVC:   - rescinded by NP Is the patient medically cleared: Yes.   Is there vacancy in the ED BHU: Yes.   Is the population mix appropriate for patient: Yes.   Is the patient awaiting placement in inpatient or outpatient setting: Yes.   Has the patient had a psychiatric consult: Yes.   Survey of unit performed for contraband, proper placement and condition of furniture, tampering with fixtures in bathroom, shower, and each patient room: Yes.  ; Findings:  APPEARANCE/BEHAVIOR Calm and cooperative  tearful NEURO ASSESSMENT Orientation: oriented x3  Denies pain Hallucinations: No.None noted (Hallucinations) denies Speech: Normal Gait: normal RESPIRATORY ASSESSMENT Even  Unlabored respirations  CARDIOVASCULAR ASSESSMENT Pulses equal   regular rate  Skin warm and dry   GASTROINTESTINAL ASSESSMENT no GI complaint EXTREMITIES Full ROM  PLAN OF CARE Provide calm/safe environment. Vital signs assessed twice daily. ED BHU Assessment once each 12-hour shift. Assure the ED provider has rounded once each shift. Provide and encourage hygiene. Provide redirection as needed. Assess for escalating behavior; address immediately and inform ED provider.  Assess family dynamic and appropriateness for visitation as needed: Yes.  ; If necessary, describe findings:  Educate the patient/family about BHU procedures/visitation: Yes.  ; If necessary, describe findings:

## 2018-10-11 NOTE — Consult Note (Signed)
[facility] Psych ED Discharge  10/11/2018 10:38 AM Julie Lambert  MRN:  [id number] Principal Problem: Cocaine abuse with cocaine-induced mood disorder [facility]) Discharge Diagnoses: Principal Problem:   Cocaine abuse with cocaine-induced mood disorder ([place])  Subjective: "I'm better today."  Denies suicidal/homicidal ideations, hallucinations, or withdrawal symptoms.  Requests a letter to return to work tomorrow, completed.  Patient seen and evaluated by this provider in person. States she feels better and plans to follow up with [facility] for substance abuse and her depression/anxiety.  No concerns at this time except a letter for work.  Stable for discharge.  10/10/18: Julie Lambert is a 53 y.o. female patient presented to the ED thinking she got some bad drugs.  On assessment, she reports she was using drugs until about 6 am and started not feeling right, came to the ED.  No suicidal/homicidal ideations, hallucinations, or withdrawal symptoms.    Patient seen and evaluated in the ED by this provider in person.  She was sleepy as she reports she was drinking and using drugs until early this morning, 6 am.  Recently left the inpatient admission and reports taking her medications but has not followed up at [facility].  She is worried about the drugs she got, anxiety high.  Agreed to restart her medications and follow up with [facility] tomorrow when they are open, feels better being monitored at this time.  HPI per TTS:  53 y.o.femalewho presents to the ER because, "she wasn't feeling right." Patient admits to using cocaine prior to coming to the ER and not taking her medications. Patient reports of currently having SI with no plan, intentions or gestures.  Patient was recently inpatient with [facility] and discharged 09/06/2018. She was to follow up with [facility] for outpatient treatment.  Total Time spent with patient: 30 minutes  Past Psychiatric History: substance abuse, bipolar d/o  Past Medical History:   Past Medical History:  Diagnosis Date  . Bipolar 2 disorder ([place])   . Diabetes mellitus without complication ([place])   . Diverticulitis   . Diverticulosis   . GERD (gastroesophageal reflux disease)   . Sciatica   . Substance abuse ([place])   . Uterine fibroid     Past Surgical History:  Procedure Laterality Date  . BACK SURGERY    . CHOLECYSTECTOMY    . LUMBAR SPINE SURGERY     Family History:  Family History  Problem Relation Age of Onset  . CAD Mother   . Diabetes Mother   . Heart disease Mother   . Parkinson's disease Mother   . Kidney disease Mother   . Cancer Father        prostate cancer  . Breast cancer Maternal Aunt   . Breast cancer Maternal Grandmother    Family Psychiatric  History: see above Social History:  Social History   Substance and Sexual Activity  Alcohol Use Not Currently   Comment: last use 7/22 3 40oz      Social History   Substance and Sexual Activity  Drug Use Not Currently  . Types: "Crack" cocaine   Comment: last use 7/22, smoked crack    Social History   Socioeconomic History  . Marital status: Divorced    Spouse name: Not on file  . Number of children: Not on file  . Years of education: Not on file  . Highest education level: Not on file  Occupational History  . Not on file  Social Needs  . Financial resource strain: Not on  file  . Food insecurity    Worry: Not on file    Inability: Not on file  . Transportation needs    Medical: Not on file    Non-medical: Not on file  Tobacco Use  . Smoking status: Current Every Day Smoker    Packs/day: 0.25    Years: 40.00    Pack years: 10.00    Types: Cigarettes  . Smokeless tobacco: Never Used  Substance and Sexual Activity  . Alcohol use: Not Currently    Comment: last use 7/22 3 40oz   . Drug use: Not Currently    Types: "Crack" cocaine    Comment: last use 7/22, smoked crack  . Sexual activity: Yes  Lifestyle  . Physical activity    Days per week: Not on file    Minutes  per session: Not on file  . Stress: Not on file  Relationships  . Social Herbalist on phone: Not on file    Gets together: Not on file    Attends religious service: Not on file    Active member of club or organization: Not on file    Attends meetings of clubs or organizations: Not on file    Relationship status: Not on file  Other Topics Concern  . Not on file  Social History Narrative  . Not on file    Has this patient used any form of tobacco in the last 30 days? (Cigarettes, Smokeless Tobacco, Cigars, and/or Pipes) A prescription for an FDA-approved tobacco cessation medication was offered at discharge and the patient refused  Current Medications: Current Facility-Administered Medications  Medication Dose Route Frequency Provider Last Rate Last Dose  . atorvastatin (LIPITOR) tablet 10 mg  10 mg Oral q1800 Patrecia Pour, NP      . DULoxetine (CYMBALTA) DR capsule 60 mg  60 mg Oral Daily Patrecia Pour, NP   60 mg at 10/10/18 1957  . gabapentin (NEURONTIN) capsule 400 mg  400 mg Oral TID Patrecia Pour, NP   400 mg at 10/10/18 2212  . ibuprofen (ADVIL) tablet 600 mg  600 mg Oral Q6H PRN Patrecia Pour, NP   600 mg at 10/10/18 1957  . insulin glargine (LANTUS) injection 20 Units  20 Units Subcutaneous QHS Patrecia Pour, NP   20 Units at 10/10/18 2212  . metFORMIN (GLUCOPHAGE) tablet 1,000 mg  1,000 mg Oral BID WC Patrecia Pour, NP      . pantoprazole (PROTONIX) EC tablet 40 mg  40 mg Oral Daily Patrecia Pour, NP   40 mg at 10/10/18 1957  . traZODone (DESYREL) tablet 150 mg  150 mg Oral QHS Patrecia Pour, NP   150 mg at 10/10/18 2212   Current Outpatient Medications  Medication Sig Dispense Refill  . atorvastatin (LIPITOR) 10 MG tablet Take 1 tablet (10 mg total) by mouth daily at 6 PM. 30 tablet 1  . cyclobenzaprine (FLEXERIL) 5 MG tablet Take 1 tablet (5 mg total) by mouth 3 (three) times daily as needed for muscle spasms. 60 tablet 1  . DULoxetine  (CYMBALTA) 60 MG capsule Take 1 capsule (60 mg total) by mouth daily. 30 capsule 1  . gabapentin (NEURONTIN) 400 MG capsule Take 1 capsule (400 mg total) by mouth 3 (three) times daily. 90 capsule 1  . ibuprofen (ADVIL) 600 MG tablet Take 1 tablet (600 mg total) by mouth every 6 (six) hours as needed. 60 tablet 1  . insulin  glargine (LANTUS) 100 UNIT/ML injection Inject 0.2 mLs (20 Units total) into the skin at bedtime. 10 mL 1  . metFORMIN (GLUCOPHAGE) 1000 MG tablet Take 1 tablet (1,000 mg total) by mouth 2 (two) times daily with a meal. 60 tablet 1  . pantoprazole (PROTONIX) 40 MG tablet Take 1 tablet (40 mg total) by mouth daily. 30 tablet 1  . traZODone (DESYREL) 150 MG tablet Take 1 tablet (150 mg total) by mouth at bedtime. 30 tablet 1   PTA Medications: (Not in a hospital admission)   Musculoskeletal: Strength & Muscle Tone: within normal limits Gait & Station: normal Patient leans: N/A  Psychiatric Specialty Exam: Physical Exam  Nursing note and vitals reviewed. Constitutional: She is oriented to person, place, and time. She appears well-developed and well-nourished.  HENT:  Head: Normocephalic.  Neck: Normal range of motion.  Respiratory: Effort normal.  Musculoskeletal: Normal range of motion.  Neurological: She is alert and oriented to person, place, and time.  Psychiatric: Her speech is normal and behavior is normal. Judgment and thought content normal. Her mood appears anxious. Cognition and memory are normal.    Review of Systems  Psychiatric/Behavioral: Positive for substance abuse. The patient is nervous/anxious.   All other systems reviewed and are negative.   Blood pressure (!) 172/85, pulse 72, temperature 98.5 F (36.9 C), temperature source Oral, resp. rate 18, height 5\' 7"  (1.702 m), weight 81.6 kg, last menstrual period 07/22/2016, SpO2 100 %.Body mass index is 28.19 kg/m.  General Appearance: Casual  Eye Contact:  Good  Speech:  Normal Rate  Volume:   Normal  Mood:  Anxious, mild  Affect:  Congruent  Thought Process:  Coherent and Descriptions of Associations: Intact  Orientation:  Full (Time, Place, and Person)  Thought Content:  WDL and Logical  Suicidal Thoughts:  No  Homicidal Thoughts:  No  Memory:  Immediate;   Good Recent;   Good Remote;   Good  Judgement:  Fair  Insight:  Fair  Psychomotor Activity:  Normal  Concentration:  Concentration: Good and Attention Span: Good  Recall:  Good  Fund of Knowledge:  Fair  Language:  Good  Akathisia:  No  Handed:  Right  AIMS (if indicated):     Assets:  Leisure Time Physical Health Resilience  ADL's:  Intact  Cognition:  WNL  Sleep:        Demographic Factors:  Living alone  Loss Factors: NA  Historical Factors: NA  Risk Reduction Factors:   Sense of responsibility to family, Positive social support and Positive therapeutic relationship  Continued Clinical Symptoms:  Anxiety, mild  Cognitive Features That Contribute To Risk:  None    Suicide Risk:  Minimal: No identifiable suicidal ideation.  Patients presenting with no risk factors but with morbid ruminations; may be classified as minimal risk based on the severity of the depressive symptoms   Plan Of Care/Follow-up recommendations:  Cocaine abuse with cocaine induced mood disorder: -Continue gabapentin 400 mg TID -Follow up with [facility]   Depression: -Continue Cymbalta 60 mg daily  Insomnia: -Continue Trazodone 150 mg at bedtime Activity:  as tolerated Diet:  heart healthy diet  Disposition: discharge home Waylan Boga, NP 10/11/2018, 10:38 AM

## 2018-10-11 NOTE — ED Notes (Signed)
Patient observed lying in bed with eyes closed  Even, unlabored respirations observed   NAD pt appears to be sleeping  I will continue to monitor along with every 15 minute visual observations and ongoing security camera monitoring    

## 2018-11-19 ENCOUNTER — Other Ambulatory Visit: Payer: Self-pay | Admitting: Psychiatry

## 2018-12-08 ENCOUNTER — Emergency Department
Admission: EM | Admit: 2018-12-08 | Discharge: 2018-12-08 | Disposition: A | Discharge status: Home / Self Care | Payer: BC Managed Care – PPO | Attending: Student in an Organized Health Care Education/Training Program | Admitting: Student in an Organized Health Care Education/Training Program

## 2018-12-08 ENCOUNTER — Emergency Department: Payer: BC Managed Care – PPO

## 2018-12-08 ENCOUNTER — Other Ambulatory Visit: Payer: Self-pay

## 2018-12-08 DIAGNOSIS — Z794 Long term (current) use of insulin: Secondary | ICD-10-CM | POA: Diagnosis not present

## 2018-12-08 DIAGNOSIS — F17210 Nicotine dependence, cigarettes, uncomplicated: Secondary | ICD-10-CM | POA: Diagnosis not present

## 2018-12-08 DIAGNOSIS — E119 Type 2 diabetes mellitus without complications: Secondary | ICD-10-CM | POA: Diagnosis not present

## 2018-12-08 DIAGNOSIS — Z79899 Other long term (current) drug therapy: Secondary | ICD-10-CM | POA: Diagnosis not present

## 2018-12-08 DIAGNOSIS — M25512 Pain in left shoulder: Secondary | ICD-10-CM | POA: Diagnosis present

## 2018-12-08 LAB — CBC
HCT: 36.9 % (ref 36.0–46.0)
Hemoglobin: 12.4 g/dL (ref 12.0–15.0)
MCH: 30 pg (ref 26.0–34.0)
MCHC: 33.6 g/dL (ref 30.0–36.0)
MCV: 89.3 fL (ref 80.0–100.0)
Platelets: 229 10*3/uL (ref 150–400)
RBC: 4.13 MIL/uL (ref 3.87–5.11)
RDW: 12.4 % (ref 11.5–15.5)
WBC: 11 10*3/uL — ABNORMAL HIGH (ref 4.0–10.5)
nRBC: 0 % (ref 0.0–0.2)

## 2018-12-08 LAB — BASIC METABOLIC PANEL
Anion gap: 10 (ref 5–15)
BUN: 14 mg/dL (ref 6–20)
CO2: 22 mmol/L (ref 22–32)
Calcium: 8.8 mg/dL — ABNORMAL LOW (ref 8.9–10.3)
Chloride: 105 mmol/L (ref 98–111)
Creatinine, Ser: 0.74 mg/dL (ref 0.44–1.00)
GFR calc Af Amer: >60 mL/min (ref >60)
GFR calc non Af Amer: >60 mL/min (ref >60)
Glucose, Bld: 268 mg/dL — ABNORMAL HIGH (ref 70–99)
Potassium: 3.4 mmol/L — ABNORMAL LOW (ref 3.5–5.1)
Sodium: 137 mmol/L (ref 135–145)

## 2018-12-08 LAB — TROPONIN I (HIGH SENSITIVITY)
Troponin I (High Sensitivity): 11 ng/L (ref <18)
Troponin I (High Sensitivity): 8 ng/L (ref <18)

## 2018-12-08 MED ORDER — GABAPENTIN 400 MG PO CAPS: 400.0000 mg | ORAL_CAPSULE | Freq: Three times a day (TID) | ORAL | 0 refills | Status: DC

## 2018-12-08 MED ORDER — CYCLOBENZAPRINE HCL 10 MG PO TABS
10.0000 mg | ORAL_TABLET | Freq: Once | ORAL | Status: AC
  Administered 2018-12-08: 18:00:00 10 mg via ORAL
  Filled 2018-12-08: qty 1

## 2018-12-08 MED ORDER — IBUPROFEN 600 MG PO TABS: 600.0000 mg | ORAL_TABLET | Freq: Four times a day (QID) | ORAL | 0 refills | Status: DC | PRN

## 2018-12-08 MED ORDER — KETOROLAC TROMETHAMINE 30 MG/ML IJ SOLN
30.0000 mg | Freq: Once | INTRAMUSCULAR | Status: AC
  Administered 2018-12-08: 18:00:00 30 mg via INTRAMUSCULAR
  Filled 2018-12-08: qty 1

## 2018-12-08 MED ORDER — HYDROCODONE-ACETAMINOPHEN 5-325 MG PO TABS
2.0000 | ORAL_TABLET | Freq: Once | ORAL | Status: AC
  Administered 2018-12-08: 2 via ORAL
  Filled 2018-12-08: qty 2

## 2018-12-08 MED ORDER — HYDROCODONE-ACETAMINOPHEN 5-325 MG PO TABS: 1.0000 | ORAL_TABLET | ORAL | 0 refills | Status: DC | PRN

## 2018-12-08 NOTE — ED Triage Notes (Signed)
Left shoulder pain that radiates up into back and neck. Pt reports pain with movement. Pt alert and oriented X4, cooperative, RR even and unlabored, color WNL. Pt in NAD.

## 2018-12-08 NOTE — ED Provider Notes (Signed)
[facility] Emergency Department Provider Note    First MD Initiated Contact with Patient 12/08/18 1620     (approximate)  I have reviewed the triage vital signs and the nursing notes.   HISTORY  Chief Complaint Shoulder Pain    HPI Julie Lambert is a 53 y.o. female close past medical history presents the ER for 1 day of left-sided neck and shoulder pain as well as some chest discomfort.  States that she woke up with this pain.  States the pain is worse with movement.  Denies any trauma.  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  Denies any fevers.  No diaphoresis.  No pain shooting or tearing through to her back.  Denies any numbness or tingling.  Had pain like this before.  Has not taken anything for the pain.    Past Medical History:  Diagnosis Date  . Bipolar 2 disorder ([place])   . Diabetes mellitus without complication ([place])   . Diverticulitis   . Diverticulosis   . GERD (gastroesophageal reflux disease)   . Sciatica   . Substance abuse ([place])   . Uterine fibroid    Family History  Problem Relation Age of Onset  . CAD Mother   . Diabetes Mother   . Heart disease Mother   . Parkinson's disease Mother   . Kidney disease Mother   . Cancer Father        prostate cancer  . Breast cancer Maternal Aunt   . Breast cancer Maternal Grandmother    Past Surgical History:  Procedure Laterality Date  . BACK SURGERY    . CHOLECYSTECTOMY    . LUMBAR SPINE SURGERY     Patient Active Problem List   Diagnosis Date Noted  . Cocaine abuse with cocaine-induced mood disorder ([place]) 10/10/2018  . Major depression, recurrent ([place]) 09/03/2018  . Special screening for malignant neoplasms, colon 04/06/2018  . Liver hemangioma 03/01/2018  . Uncontrolled type 2 diabetes mellitus with hyperglycemia ([place]) 03/01/2018  . Tobacco use disorder 01/04/2018  . Suicidal ideation 01/04/2018  . Bipolar I disorder, most recent episode  depressed, severe without psychotic features ([place]) 01/02/2018  . Diabetes mellitus without complication ([place]) 0000000  . Severe alcohol use disorder ([place]) 12/25/2017  . Cocaine use disorder, moderate, dependence ([place]) 12/25/2017      Prior to Admission medications   Medication Sig Start Date End Date Taking? Authorizing Provider  atorvastatin (LIPITOR) 10 MG tablet Take 1 tablet (10 mg total) by mouth daily at 6 PM. 09/06/18   Clapacs, [name], MD  cyclobenzaprine (FLEXERIL) 5 MG tablet Take 1 tablet (5 mg total) by mouth 3 (three) times daily as needed for muscle spasms. 09/06/18   Clapacs, [name], MD  DULoxetine (CYMBALTA) 60 MG capsule Take 1 capsule by mouth once daily 11/29/18   Clapacs, [name], MD  gabapentin (NEURONTIN) 400 MG capsule Take 1 capsule (400 mg total) by mouth 3 (three) times daily. 12/08/18   Merlyn Lot, MD  HYDROcodone-acetaminophen (NORCO) 5-325 MG tablet Take 1 tablet by mouth every 4 (four) hours as needed for moderate pain. 12/08/18   Merlyn Lot, MD  ibuprofen (ADVIL) 600 MG tablet Take 1 tablet (600 mg total) by mouth every 6 (six) hours as needed. 12/08/18   Merlyn Lot, MD  insulin glargine (LANTUS) 100 UNIT/ML injection Inject 0.2 mLs (20 Units total) into the skin at bedtime. 09/09/18   Clapacs, [name], MD  metFORMIN (GLUCOPHAGE) 1000 MG tablet Take 1 tablet (1,000 mg total) by mouth 2 (two) times daily with a meal. 09/06/18   Clapacs, [name], MD  pantoprazole (PROTONIX) 40 MG tablet Take 1 tablet (40 mg total) by mouth daily. 09/07/18   Clapacs, [name], MD  traZODone (DESYREL) 150 MG tablet Take 1 tablet (150 mg total) by mouth at bedtime. 09/06/18   Clapacs, [name], MD    Allergies Ivp dye [iodinated diagnostic agents]    Social History Social History   Tobacco Use  . Smoking status: Current Every Day Smoker    Packs/day: 0.25    Years: 40.00    Pack years: 10.00    Types: Cigarettes  . Smokeless tobacco: Never Used  Substance Use  Topics  . Alcohol use: Not Currently    Comment: last use 7/22 3 40oz   . Drug use: Not Currently    Types: "Crack" cocaine    Comment: last use 7/22, smoked crack    Review of Systems Patient denies headaches, rhinorrhea, blurry vision, numbness, shortness of breath, chest pain, edema, cough, abdominal pain, nausea, vomiting, diarrhea, dysuria, fevers, rashes or hallucinations unless otherwise stated above in HPI. ____________________________________________   PHYSICAL EXAM:  VITAL SIGNS: Vitals:   12/08/18 1356 12/08/18 1640  BP: (!) 124/101 (!) 132/98  Pulse: 84 88  Resp: 18 17  Temp: 98.6 F (37 C)   SpO2: 100% 100%    Constitutional: Alert and oriented.  Eyes: Conjunctivae are normal.  Head: Atraumatic. Nose: No congestion/rhinnorhea. Mouth/Throat: Mucous membranes are moist.   Neck: No stridor. Painless ROM.  Cardiovascular: Normal rate, regular rhythm. Grossly normal heart sounds.  Good peripheral circulation. Respiratory: Normal respiratory effort.  No retractions. Lungs CTAB. Gastrointestinal: Soft and nontender. No distention. No abdominal bruits. No CVA tenderness. Genitourinary:  Musculoskeletal: No lower extremity tenderness nor edema.  No joint effusions. Neurologic:  Normal speech and language. No gross focal neurologic deficits are appreciated. No facial droop Skin:  Skin is warm, dry and intact. No rash noted. Psychiatric: Mood and affect are normal. Speech and behavior are normal.  ____________________________________________   LABS (all labs ordered are listed, but only abnormal results are displayed)  Results for orders placed or performed during the hospital encounter of 12/08/18 (from the past 24 hour(s))  Basic metabolic panel     Status: Abnormal   Collection Time: 12/08/18  1:58 PM  Result Value Ref Range   Sodium 137 135 - 145 mmol/L   Potassium 3.4 (L) 3.5 - 5.1 mmol/L   Chloride 105 98 - 111 mmol/L   CO2 22 22 - 32 mmol/L   Glucose,  Bld 268 (H) 70 - 99 mg/dL   BUN 14 6 - 20 mg/dL   Creatinine, Ser 0.74 0.44 - 1.00 mg/dL   Calcium 8.8 (L) 8.9 - 10.3 mg/dL   GFR calc non Af Amer >60 >60 mL/min   GFR calc Af Amer >60 >60 mL/min   Anion gap 10 5 - 15  CBC     Status: Abnormal   Collection Time: 12/08/18  1:58 PM  Result Value Ref Range   WBC 11.0 (H) 4.0 - 10.5 K/uL   RBC 4.13 3.87 - 5.11 MIL/uL   Hemoglobin 12.4 12.0 - 15.0 g/dL   HCT 36.9 36.0 - 46.0 %   MCV 89.3 80.0 - 100.0 fL   MCH 30.0 26.0 - 34.0 pg   MCHC 33.6 30.0 - 36.0 g/dL   RDW 12.4 11.5 - 15.5 %  Platelets 229 150 - 400 K/uL   nRBC 0.0 0.0 - 0.2 %  Troponin I (High Sensitivity)     Status: None   Collection Time: 12/08/18  1:58 PM  Result Value Ref Range   Troponin I (High Sensitivity) 11 <18 ng/L  Troponin I (High Sensitivity)     Status: None   Collection Time: 12/08/18  3:58 PM  Result Value Ref Range   Troponin I (High Sensitivity) 8 <18 ng/L   ____________________________________________  EKG My review and personal interpretation at Time: 13:54   Indication: shoulder pain  Rate: 75  Rhythm: sinus Axis: left Other: poor r wave progression, no stemi or depressions ____________________________________________  RADIOLOGY  I personally reviewed all radiographic images ordered to evaluate for the above acute complaints and reviewed radiology reports and findings.  These findings were personally discussed with the patient.  Please see medical record for radiology report.  ____________________________________________   PROCEDURES  Procedure(s) performed:  Procedures    Critical Care performed: no ____________________________________________   INITIAL IMPRESSION / ASSESSMENT AND PLAN / ED COURSE  Pertinent labs & imaging results that were available during my care of the patient were reviewed by me and considered in my medical decision making (see chart for details).   DDX: ACS, pericarditis, cervical strain, torticollis  dissection, pna, bronchitis, costochondritis   Julie Lambert is a 53 y.o. who presents to the ED with left shoulder pain and neck pain as described above.  Pain reproduced with palpation.  Seems less consistent with ACS.  Will give pain medication and reassess.  Have a low suspicion for dissection or PE.  Doubt infectious process. No evidence of septic arthritis or cellulitis.  Neuro intact.  Clinical Course as of Dec 07 1898  Wed Dec 08, 2018  1858 Patient reassessed with significant improvement in pain.  Repeat enzyme negative.  Is not consistent with ACS dissection or PE.  Most likely musculoskeletal strain or torticollis.  Patient also requesting refill for fever chronic medications which I will give her a short refill until she can see her PCP.  Given reassuring work-up I do believe she is appropriate for outpatient follow-up.   [PR]    Clinical Course User Index [PR] Merlyn Lot, MD    The patient was evaluated in Emergency Department today for the symptoms described in the history of present illness. He/she was evaluated in the context of the global COVID-19 pandemic, which necessitated consideration that the patient might be at risk for infection with the SARS-CoV-2 virus that causes COVID-19. Institutional protocols and algorithms that pertain to the evaluation of patients at risk for COVID-19 are in a state of rapid change based on information released by regulatory bodies including the CDC and federal and state organizations. These policies and algorithms were followed during the patient's care in the ED.  As part of my medical decision making, I reviewed the following data within the [place] notes reviewed and incorporated, Labs reviewed, notes from prior ED visits and [place] Controlled Substance Database   ____________________________________________   FINAL CLINICAL IMPRESSION(S) / ED DIAGNOSES  Final diagnoses:  Acute pain of left shoulder       NEW MEDICATIONS STARTED DURING THIS VISIT:  New Prescriptions   HYDROCODONE-ACETAMINOPHEN (NORCO) 5-325 MG TABLET    Take 1 tablet by mouth every 4 (four) hours as needed for moderate pain.     Note:  This document was prepared using Dragon voice recognition software and may include unintentional  dictation errors.    Merlyn Lot, MD 12/08/18 1900

## 2019-01-13 ENCOUNTER — Other Ambulatory Visit: Payer: Self-pay | Admitting: Psychiatry

## 2019-02-02 ENCOUNTER — Other Ambulatory Visit: Payer: Self-pay

## 2019-02-02 ENCOUNTER — Encounter: Payer: Self-pay | Admitting: Physician Assistant

## 2019-02-02 ENCOUNTER — Ambulatory Visit: Payer: Self-pay | Admitting: Physician Assistant

## 2019-02-02 DIAGNOSIS — A5901 Trichomonal vulvovaginitis: Secondary | ICD-10-CM

## 2019-02-02 DIAGNOSIS — Z113 Encounter for screening for infections with a predominantly sexual mode of transmission: Secondary | ICD-10-CM

## 2019-02-02 LAB — WET PREP FOR TRICH, YEAST, CLUE
Trichomonas Exam: POSITIVE — AB
Yeast Exam: NEGATIVE

## 2019-02-02 MED ORDER — METRONIDAZOLE 500 MG PO TABS: 500.0000 mg | ORAL_TABLET | Freq: Two times a day (BID) | ORAL | 0 refills | Status: AC

## 2019-02-02 NOTE — Progress Notes (Signed)
[facility] Department STI clinic/screening visit  Subjective:  Julie Lambert is a 53 y.o. female being seen today for an STI screening visit. The patient reports they do have symptoms.  Patient reports that they do not desire a pregnancy in the next year.   They reported they are not interested in discussing contraception today.  Patient's last menstrual period was 07/22/2016 (approximate).   Patient has the following medical conditions:   Patient Active Problem List   Diagnosis Date Noted  . Cocaine abuse with cocaine-induced mood disorder ([place]) 10/10/2018  . Major depression, recurrent ([place]) 09/03/2018  . Special screening for malignant neoplasms, colon 04/06/2018  . Liver hemangioma 03/01/2018  . Uncontrolled type 2 diabetes mellitus with hyperglycemia ([place]) 03/01/2018  . Tobacco use disorder 01/04/2018  . Suicidal ideation 01/04/2018  . Bipolar I disorder, most recent episode depressed, severe without psychotic features ([place]) 01/02/2018  . Diabetes mellitus without complication ([place]) 0000000  . Severe alcohol use disorder ([place]) 12/25/2017  . Cocaine use disorder, moderate, dependence ([place]) 12/25/2017    Chief Complaint  Patient presents with  . SEXUALLY TRANSMITTED DISEASE    HPI  Patient reports that she started having "bad" discharge and irritation 2 days ago.  LMP was 5 years ago and uses condoms most times for STD prevention.    See flowsheet for further details and programmatic requirements.    The following portions of the patient's history were reviewed and updated as appropriate: allergies, current medications, past medical history, past social history, past surgical history and problem list.  Objective:  There were no vitals filed for this visit.  Physical Exam Constitutional:      General: She is not in acute distress.    Appearance: Normal appearance. She is normal weight.  HENT:     Head: Normocephalic and atraumatic.     Comments:  No nits, lice, or hair loss. No cervical, supraclavicular or axillary adenopathy.    Mouth/Throat:     Mouth: Mucous membranes are moist.     Pharynx: Oropharynx is clear. No oropharyngeal exudate or posterior oropharyngeal erythema.  Eyes:     Conjunctiva/sclera: Conjunctivae normal.  Pulmonary:     Effort: Pulmonary effort is normal.  Abdominal:     Palpations: Abdomen is soft. There is no mass.     Tenderness: There is no abdominal tenderness. There is no guarding or rebound.  Genitourinary:    General: Normal vulva.     Rectum: Normal.     Comments: External genitalia/pubic area without nits, lice, edema, erythema, lesions and inguinal adenopathy. Speculum exam not done due to patient extremely uncomfortable, Q-tips introduced to vagina for wet mount and GC/Chlamydia testing that was very uncomfortable for patient so bimanual not performed due to patient declining. Musculoskeletal:     Cervical back: Neck supple. No tenderness.  Skin:    General: Skin is warm and dry.     Findings: No bruising, lesion or rash.  Neurological:     Mental Status: She is alert and oriented to person, place, and time.  Psychiatric:        Mood and Affect: Mood normal.        Behavior: Behavior normal.        Thought Content: Thought content normal.        Judgment: Judgment normal.      Assessment and Plan:  Julie Lambert is a 53 y.o. female presenting to the [facility] Department for STI screening  1. Screening  for STD (sexually transmitted disease) Patient into clinic with symptoms.  Declines all blood work today. Rec condoms with all sex. Await test results.  Counseled that RN will call if needs to RTC for further treatment once results are back. - WET PREP FOR [place], YEAST, [facility]  2. Trichomonal vulvovaginitis Will treat with Metronidazole 500mg  #14 1 po BID for 7 days with food, no EtOH for 24 hr before and 72 hr after completing  medicine. No sex for 7 days and until after partner completes treatment. Rec using OTC antifungal cream if needed for itching during or just after taking antibiotic. - metroNIDAZOLE (FLAGYL) 500 MG tablet; Take 1 tablet (500 mg total) by mouth 2 (two) times daily for 7 days.  Dispense: 14 tablet; Refill: 0  3.  Preventive Health Enc patient in continued efforts at recovery from substance abuse. Enc patient to continue seeing Psych/counselor for anxiety and depression. Continue follow up with PCP for age appropriate screenings.     No follow-ups on file.  No future appointments.  Jerene Dilling, PA

## 2019-03-17 ENCOUNTER — Other Ambulatory Visit: Payer: Self-pay

## 2019-03-17 ENCOUNTER — Encounter: Payer: Self-pay | Admitting: Emergency Medicine

## 2019-03-17 ENCOUNTER — Emergency Department
Admission: EM | Admit: 2019-03-17 | Discharge: 2019-03-18 | Disposition: A | Discharge status: Other Acute Inpatient Hospital | Payer: BC Managed Care – PPO | Attending: Student | Admitting: Student

## 2019-03-17 DIAGNOSIS — F419 Anxiety disorder, unspecified: Secondary | ICD-10-CM | POA: Insufficient documentation

## 2019-03-17 DIAGNOSIS — F17210 Nicotine dependence, cigarettes, uncomplicated: Secondary | ICD-10-CM | POA: Insufficient documentation

## 2019-03-17 DIAGNOSIS — Z20822 Contact with and (suspected) exposure to covid-19: Secondary | ICD-10-CM | POA: Insufficient documentation

## 2019-03-17 DIAGNOSIS — F3160 Bipolar disorder, current episode mixed, unspecified: Secondary | ICD-10-CM | POA: Insufficient documentation

## 2019-03-17 DIAGNOSIS — F329 Major depressive disorder, single episode, unspecified: Secondary | ICD-10-CM | POA: Insufficient documentation

## 2019-03-17 DIAGNOSIS — F1420 Cocaine dependence, uncomplicated: Secondary | ICD-10-CM | POA: Insufficient documentation

## 2019-03-17 DIAGNOSIS — Z79899 Other long term (current) drug therapy: Secondary | ICD-10-CM | POA: Insufficient documentation

## 2019-03-17 DIAGNOSIS — Z794 Long term (current) use of insulin: Secondary | ICD-10-CM | POA: Insufficient documentation

## 2019-03-17 DIAGNOSIS — F1990 Other psychoactive substance use, unspecified, uncomplicated: Secondary | ICD-10-CM

## 2019-03-17 DIAGNOSIS — E119 Type 2 diabetes mellitus without complications: Secondary | ICD-10-CM | POA: Insufficient documentation

## 2019-03-17 LAB — CBC
HCT: 40.2 % (ref 36.0–46.0)
Hemoglobin: 13.6 g/dL (ref 12.0–15.0)
MCH: 29.8 pg (ref 26.0–34.0)
MCHC: 33.8 g/dL (ref 30.0–36.0)
MCV: 88.2 fL (ref 80.0–100.0)
Platelets: 248 10*3/uL (ref 150–400)
RBC: 4.56 MIL/uL (ref 3.87–5.11)
RDW: 12.8 % (ref 11.5–15.5)
WBC: 10.6 10*3/uL — ABNORMAL HIGH (ref 4.0–10.5)
nRBC: 0 % (ref 0.0–0.2)

## 2019-03-17 LAB — COMPREHENSIVE METABOLIC PANEL
ALT: 15 U/L (ref 0–44)
AST: 15 U/L (ref 15–41)
Albumin: 3.7 g/dL (ref 3.5–5.0)
Alkaline Phosphatase: 129 U/L — ABNORMAL HIGH (ref 38–126)
Anion gap: 11 (ref 5–15)
BUN: 13 mg/dL (ref 6–20)
CO2: 20 mmol/L — ABNORMAL LOW (ref 22–32)
Calcium: 9.3 mg/dL (ref 8.9–10.3)
Chloride: 105 mmol/L (ref 98–111)
Creatinine, Ser: 0.7 mg/dL (ref 0.44–1.00)
GFR calc Af Amer: >60 mL/min (ref >60)
GFR calc non Af Amer: >60 mL/min (ref >60)
Glucose, Bld: 375 mg/dL — ABNORMAL HIGH (ref 70–99)
Potassium: 4.3 mmol/L (ref 3.5–5.1)
Sodium: 136 mmol/L (ref 135–145)
Total Bilirubin: 0.4 mg/dL (ref 0.3–1.2)
Total Protein: 7.4 g/dL (ref 6.5–8.1)

## 2019-03-17 LAB — ETHANOL: Alcohol, Ethyl (B): <10 mg/dL (ref <10)

## 2019-03-17 LAB — RESPIRATORY PANEL BY RT PCR (FLU A&B, COVID)
Influenza A by PCR: NEGATIVE
Influenza B by PCR: NEGATIVE
SARS Coronavirus 2 by RT PCR: NEGATIVE

## 2019-03-17 LAB — URINE DRUG SCREEN, QUALITATIVE (ARMC ONLY)
Amphetamines, Ur Screen: NOT DETECTED
Barbiturates, Ur Screen: NOT DETECTED
Benzodiazepine, Ur Scrn: NOT DETECTED
Cannabinoid 50 Ng, Ur ~~LOC~~: NOT DETECTED
Cocaine Metabolite,Ur ~~LOC~~: NOT DETECTED
MDMA (Ecstasy)Ur Screen: NOT DETECTED
Methadone Scn, Ur: NOT DETECTED
Opiate, Ur Screen: NOT DETECTED
Phencyclidine (PCP) Ur S: NOT DETECTED
Tricyclic, Ur Screen: NOT DETECTED

## 2019-03-17 LAB — GLUCOSE, CAPILLARY
Glucose-Capillary: 349 mg/dL — ABNORMAL HIGH (ref 70–99)
Glucose-Capillary: 358 mg/dL — ABNORMAL HIGH (ref 70–99)
Glucose-Capillary: 287 mg/dL — ABNORMAL HIGH (ref 70–99)

## 2019-03-17 LAB — ACETAMINOPHEN LEVEL: Acetaminophen (Tylenol), Serum: <10 ug/mL — ABNORMAL LOW (ref 10–30)

## 2019-03-17 LAB — SALICYLATE LEVEL: Salicylate Lvl: <7 mg/dL — ABNORMAL LOW (ref 7.0–30.0)

## 2019-03-17 MED ORDER — INSULIN ASPART 100 UNIT/ML ~~LOC~~ SOLN: 0.0000 [IU] | Freq: Every day | SUBCUTANEOUS | Status: DC

## 2019-03-17 MED ORDER — LORAZEPAM 2 MG PO TABS
2.0000 mg | ORAL_TABLET | Freq: Once | ORAL | Status: AC
  Administered 2019-03-17: 15:00:00 2 mg via ORAL
  Filled 2019-03-17: qty 1

## 2019-03-17 MED ORDER — LORAZEPAM 1 MG PO TABS: 1.0000 mg | ORAL_TABLET | Freq: Once | ORAL | Status: DC

## 2019-03-17 MED ORDER — INSULIN ASPART 100 UNIT/ML ~~LOC~~ SOLN
0.0000 [IU] | Freq: Every day | SUBCUTANEOUS | Status: DC
  Administered 2019-03-17: 21:00:00 5 [IU] via SUBCUTANEOUS
  Filled 2019-03-17: qty 1

## 2019-03-17 MED ORDER — LORAZEPAM 1 MG PO TABS
1.0000 mg | ORAL_TABLET | Freq: Once | ORAL | Status: AC
  Administered 2019-03-17: 1 mg via ORAL
  Filled 2019-03-17: qty 1

## 2019-03-17 MED ORDER — ACETAMINOPHEN 500 MG PO TABS
1000.0000 mg | ORAL_TABLET | Freq: Once | ORAL | Status: AC
  Administered 2019-03-17: 15:00:00 1000 mg via ORAL
  Filled 2019-03-17: qty 2

## 2019-03-17 MED ORDER — INSULIN ASPART 100 UNIT/ML ~~LOC~~ SOLN
0.0000 [IU] | Freq: Three times a day (TID) | SUBCUTANEOUS | Status: DC
  Administered 2019-03-17: 18:00:00 11 [IU] via SUBCUTANEOUS
  Filled 2019-03-17: qty 1

## 2019-03-17 MED ORDER — GABAPENTIN 600 MG PO TABS
300.0000 mg | ORAL_TABLET | Freq: Once | ORAL | Status: AC
  Administered 2019-03-17: 15:00:00 300 mg via ORAL
  Filled 2019-03-17: qty 1

## 2019-03-17 MED ORDER — INSULIN DETEMIR 100 UNIT/ML ~~LOC~~ SOLN
10.0000 [IU] | Freq: Every day | SUBCUTANEOUS | Status: DC
  Administered 2019-03-17: 10 [IU] via SUBCUTANEOUS
  Filled 2019-03-17 (×2): qty 0.1

## 2019-03-17 MED ORDER — INSULIN ASPART 100 UNIT/ML ~~LOC~~ SOLN
0.0000 [IU] | Freq: Three times a day (TID) | SUBCUTANEOUS | Status: DC
  Administered 2019-03-18: 09:00:00 15 [IU] via SUBCUTANEOUS
  Administered 2019-03-18: 19:00:00 4 [IU] via SUBCUTANEOUS
  Administered 2019-03-18: 13:00:00 7 [IU] via SUBCUTANEOUS
  Filled 2019-03-17 (×3): qty 1

## 2019-03-17 NOTE — BH Assessment (Signed)
Assessment Note  Julie Lambert is an 54 y.o. female who presents to the ER due to having thoughts of ending her life. She reports of having changes in her moods. "One minute I'm happy, the next I'm crying. I want to live and the next I'm ready to die." She states she was doing well since she was last inpatient. However, due to her lack of finances, she had to "spread my medicine out." She wasn't taking it as prescribed, skipping doses to make it last longer. She also reports of having decrease sleep, feelings of hopelessness and helplessness. "I should be happy about the things that are good in my life but I just can't get it together and I'm tired. I'm just tired of it. I want to end it all." Patient also states she had two recent losses of close family members. As well as February 8th, is the one-year anniversary of the passing of her father.  During the interview, the patient was tearful and at times difficult to understand. She was able to provide appropriate answers to the questions. She admits to the use of cocaine and having a recent relapse. Patient denies any involvement with the legal system and no history of violence or aggression. During the interview the patient was   Diagnosis: Major Depression  Past Medical History:  Past Medical History:  Diagnosis Date  . Bipolar 2 disorder ([place])   . Diabetes mellitus without complication ([place])   . Diverticulitis   . Diverticulosis   . GERD (gastroesophageal reflux disease)   . Sciatica   . Substance abuse ([place])   . Uterine fibroid     Past Surgical History:  Procedure Laterality Date  . BACK SURGERY    . CHOLECYSTECTOMY    . LUMBAR SPINE SURGERY      Family History:  Family History  Problem Relation Age of Onset  . CAD Mother   . Diabetes Mother   . Heart disease Mother   . Parkinson's disease Mother   . Kidney disease Mother   . Cancer Father        prostate cancer  . Breast cancer Maternal Aunt   . Breast cancer  Maternal Grandmother     Social History:  reports that she has been smoking cigarettes. She has a 10.00 pack-year smoking history. She has never used smokeless tobacco. She reports previous alcohol use. She reports previous drug use. Drug: "Crack" cocaine.  Additional Social History:  Alcohol / Drug Use Pain Medications: See PTA Prescriptions: See PTA Over the Counter: See PTA History of alcohol / drug use?: Yes Longest period of sobriety (when/how long): Unable to quantify Negative Consequences of Use: Personal relationships, Financial, Work / School Substance #1 Name of Substance 1: Cocaine 1 - Last Use / Amount: Unable to quantify Substance #2 Name of Substance 2: Cannabis 2 - Last Use / Amount: Unable to quantify Substance #3 Name of Substance 3: Alcohol 3 - Last Use / Amount: Unable to quantify  CIWA: CIWA-Ar BP: (!) 194/106 Pulse Rate: 98 COWS:    Allergies:  Allergies  Allergen Reactions  . Ivp Dye [Iodinated Diagnostic Agents] Itching    Patient stated she had itching on her back after IV injection    Home Medications: (Not in a hospital admission)   OB/GYN Status:  Patient's last menstrual period was 07/22/2016 (approximate).  General Assessment Data [facility] ED TTS Assessment: In system Is this a Tele or Face-to-Face Assessment?: Face-to-Face Is this an Initial  Assessment or a Re-assessment for this encounter?: Initial Assessment Patient Accompanied by:: N/A Language Other than English: No Living Arrangements: Other (Comment) What gender do you identify as?: Female Marital status: Single Pregnancy Status: No Living Arrangements: Alone Can pt return to current living arrangement?: Yes Admission Status: Voluntary Is patient capable of signing voluntary admission?: Yes Referral Source: Self/Family/Friend Insurance type: None  Medical Screening Exam ([place]) Medical Exam completed: Yes  Crisis Care Plan Living  Arrangements: Alone Legal Guardian: Other:(Self) Name of Psychiatrist: Reports of none Name of Therapist: Reports of none  Education Status Is patient currently in school?: No Is the patient employed, unemployed or receiving disability?: Employed  Risk to self with the past 6 months Suicidal Ideation: Yes-Currently Present Has patient been a risk to self within the past 6 months prior to admission? : Yes Suicidal Intent: No Has patient had any suicidal intent within the past 6 months prior to admission? : No Is patient at risk for suicide?: Yes Suicidal Plan?: No Has patient had any suicidal plan within the past 6 months prior to admission? : No Access to Means: Yes What has been your use of drugs/alcohol within the last 12 months?: Cocaine Previous Attempts/Gestures: No How many times?: 0 Other Self Harm Risks: Reports of none Triggers for Past Attempts: Other personal contacts Intentional Self Injurious Behavior: None Family Suicide History: No Recent stressful life event(s): Conflict (Comment), Loss (Comment), Financial Problems, Other (Comment) Persecutory voices/beliefs?: No Depression: Yes Depression Symptoms: Insomnia, Tearfulness, Isolating, Fatigue, Guilt, Loss of interest in usual pleasures, Feeling worthless/self pity Substance abuse history and/or treatment for substance abuse?: Yes Suicide prevention information given to non-admitted patients: Not applicable  Risk to Others within the past 6 months Homicidal Ideation: No Does patient have any lifetime risk of violence toward others beyond the six months prior to admission? : Unknown Thoughts of Harm to Others: No Current Homicidal Intent: No Current Homicidal Plan: No Access to Homicidal Means: No Identified Victim: Reports of none History of harm to others?: No Assessment of Violence: None Noted Violent Behavior Description: Reports of none Does patient have access to weapons?: No Criminal Charges Pending?:  No Does patient have a court date: No Is patient on probation?: No  Psychosis Hallucinations: None noted Delusions: None noted  Mental Status Report Appearance/Hygiene: Unremarkable, In scrubs Eye Contact: Fair Motor Activity: Freedom of movement, Unremarkable Speech: Logical/coherent Level of Consciousness: Alert Mood: Anxious, Helpless, Depressed, Sad, Pleasant Affect: Anxious, Appropriate to circumstance, Depressed, Sad Anxiety Level: Minimal Thought Processes: Coherent, Relevant Judgement: Unimpaired Orientation: Person, Place, Time, Situation, Appropriate for developmental age Obsessive Compulsive Thoughts/Behaviors: Minimal  Cognitive Functioning Concentration: Normal Memory: Recent Intact, Remote Intact Is patient IDD: No Insight: Fair Impulse Control: Fair Appetite: Fair Have you had any weight changes? : No Change Sleep: Decreased Total Hours of Sleep: 2 Vegetative Symptoms: None  ADLScreening [facility] Assessment Services) Patient's cognitive ability adequate to safely complete daily activities?: Yes Patient able to express need for assistance with ADLs?: Yes Independently performs ADLs?: Yes (appropriate for developmental age)  Prior Inpatient Therapy Prior Inpatient Therapy: Yes Prior Therapy Dates: 08/2018 & 12/2017 (2x) Prior Therapy Facilty/Provider(s): [facility] Reason for Treatment: Depression  Prior Outpatient Therapy Prior Outpatient Therapy: No  ADL Screening (condition at time of admission) Patient's cognitive ability adequate to safely complete daily activities?: Yes Is the patient deaf or have difficulty hearing?: No Does the patient have difficulty seeing, even when wearing glasses/contacts?: No Does the patient have difficulty concentrating, remembering,  or making decisions?: No Patient able to express need for assistance with ADLs?: Yes Does the patient have difficulty dressing or bathing?: No Independently performs ADLs?: Yes (appropriate  for developmental age) Does the patient have difficulty walking or climbing stairs?: No Weakness of Legs: None Weakness of Arms/Hands: None  Home Assistive Devices/Equipment Home Assistive Devices/Equipment: None  Therapy Consults (therapy consults require a physician order) PT Evaluation Needed: No OT Evalulation Needed: No SLP Evaluation Needed: No Abuse/Neglect Assessment (Assessment to be complete while patient is alone) Abuse/Neglect Assessment Can Be Completed: Yes Physical Abuse: Denies Verbal Abuse: Denies Sexual Abuse: Denies Exploitation of patient/patient's resources: Denies Self-Neglect: Denies Values / Beliefs Cultural Requests During Hospitalization: None Spiritual Requests During Hospitalization: None Consults Spiritual Care Consult Needed: No Transition of Care Team Consult Needed: No Advance Directives (For Healthcare) Does Patient Have a Medical Advance Directive?: No Would patient like information on creating a medical advance directive?: No - Patient declined  Child/Adolescent Assessment Running Away Risk: Denies(Patient is an adult)  Disposition:  Disposition Initial Assessment Completed for this Encounter: Yes  On Site Evaluation by:   Reviewed with Physician:    Gunnar Fusi MS, LCAS, [facility], [place] Therapeutic Triage Specialist 03/17/2019 4:01 PM

## 2019-03-17 NOTE — ED Notes (Signed)
Introduced self to pt. Pt with tears, seems depressed, sad. Denies SI at this time but states that at times she thinks what is the point of living. Pt denies plan. Pt also upset that she relapsed recently. Pt states that she has been stretching out her meds and insulin due to insurance issues. Pt also says that she has trouble getting up in morning and going to work because she is so depressed. EDP updated.

## 2019-03-17 NOTE — Progress Notes (Signed)
Inpatient Diabetes Program Recommendations  AACE/ADA: New Consensus Statement on Inpatient Glycemic Control (2015)  Target Ranges:  Prepandial:   less than 140 mg/dL      Peak postprandial:   less than 180 mg/dL (1-2 hours)      Critically ill patients:  140 - 180 mg/dL   Lab Results  Component Value Date   GLUCAP 125 (H) 10/10/2018   HGBA1C 8.2 (H) 03/19/2018    Review of Glycemic Control Results for Julie Lambert, Julie Lambert (MRN [id number]) as of 03/17/2019 13:45  Ref. Range 03/17/2019 12:03  Glucose Latest Ref Range: 70 - 99 mg/dL 375 (H)   Diabetes history: DM 2 Outpatient Diabetes medications: Lantus 20 units q HS, Metformin 1000 mg bid Current orders for Inpatient glycemic control:  Novolog moderate tid with meals and HS  Inpatient Diabetes Program Recommendations:    Referral received. Please consider adding home dose of Lantus 20 units daily as well.   Thanks  Adah Perl, RN, BC-ADM Inpatient Diabetes Coordinator Pager [phone] (8a-5p)

## 2019-03-17 NOTE — ED Notes (Signed)
Pt brought into ED BHU via sally port and wand with metal detector for safety by ODS officer. Patient oriented to unit/care area: Pt informed of unit policies and procedures.  Informed that, for their safety, care areas are designed for safety and monitored by security cameras at all times; and visiting hours explained to patient. Patient verbalizes understanding, and verbal contract for safety obtained.Pt shown to their room.

## 2019-03-17 NOTE — ED Notes (Signed)
Pt. Resting on bed in quad #22.  Pt. Waiting admission to BMU.  Pt. Requested and was given a cup of ice water.

## 2019-03-17 NOTE — ED Triage Notes (Addendum)
Pt presents to ED via POV. Pt states "I'm out of all my stuff, all my psych meds, I'm just not doing good, I'm just not doing good". Pt states has been stretching out her meds to make them last. Pt noted to be tearful upon arrival to ED and noted to be anxious. Pt states "there's something wrong with me, there's something wrong, I can't remember stuff, I can't sleep".   Pt states she relapsed with using crack a few days ago, states last use was Monday.

## 2019-03-17 NOTE — Consult Note (Signed)
[facility] Consult   Reason for Consult: Depression and SI Referring Physician: Dr. Joni Fears Patient Identification: Julie Lambert MRN:  [id number] Principal Diagnosis: <principal problem not specified> Diagnosis:  Active Problems:   * No active hospital problems. *   Total Time spent with patient: 30 minutes  Subjective:   Julie Lambert is a 54 y.o. female patient admitted with depression and SI.  HPI: Patient is a 54 year old female who presents today with a history of depression and suicidal ideation.  Patient states that she has not been taking her medications as prescribed recently due to financial concerns and lack of a primary care provider.  She states that she is slowly been regressing and now feels that she is in a constant state of fluctuation between highs and lows.  She says that it has greatly impacted her social and normal life and that she cannot take it anymore.  Patient states that she was in inpatient psychiatry sometime last year and has been using the meds prescribed to her since then.  Patient stated that she has been "stretching out her meds to try to make them last as long as possible."  Patient also states that she has a history of drug abuse.  She has recently relapsed in the past few days and is frustrated due to her failed attempt to quit.  She is in the hospital today to receive some medication assistance and said that she was fine with being admitted to inpatient psychiatry if it would help her.  Patient has no current insurance or primary care provider. Past Psychiatric History: Patient has at least 1 admittance to the psychiatry hospital approximately sometime late last year.  Patient stated that she felt this was helpful.  Patient has not seen an outpatient psychiatrist in the interim due to financial concerns.  Risk to Self: yes Risk to Others:  No Prior Inpatient Therapy:  Yes Prior Outpatient Therapy:  No  Past Medical History:   Past Medical History:  Diagnosis Date  . Bipolar 2 disorder ([place])   . Diabetes mellitus without complication ([place])   . Diverticulitis   . Diverticulosis   . GERD (gastroesophageal reflux disease)   . Sciatica   . Substance abuse ([place])   . Uterine fibroid     Past Surgical History:  Procedure Laterality Date  . BACK SURGERY    . CHOLECYSTECTOMY    . LUMBAR SPINE SURGERY     Family History:  Family History  Problem Relation Age of Onset  . CAD Mother   . Diabetes Mother   . Heart disease Mother   . Parkinson's disease Mother   . Kidney disease Mother   . Cancer Father        prostate cancer  . Breast cancer Maternal Aunt   . Breast cancer Maternal Grandmother    Family Psychiatric  History: Unknown Social History:  Social History   Substance and Sexual Activity  Alcohol Use Not Currently   Comment: last use 7/22 3 40oz      Social History   Substance and Sexual Activity  Drug Use Not Currently  . Types: "Crack" cocaine   Comment: last use 7/22, smoked crack    Social History   Socioeconomic History  . Marital status: Divorced    Spouse name: Not on file  . Number of children: Not on file  . Years of education: Not on file  . Highest education level: Not on file  Occupational History  .  Not on file  Tobacco Use  . Smoking status: Current Every Day Smoker    Packs/day: 0.25    Years: 40.00    Pack years: 10.00    Types: Cigarettes  . Smokeless tobacco: Never Used  Substance and Sexual Activity  . Alcohol use: Not Currently    Comment: last use 7/22 3 40oz   . Drug use: Not Currently    Types: "Crack" cocaine    Comment: last use 7/22, smoked crack  . Sexual activity: Yes    Birth control/protection: Condom, Post-menopausal  Other Topics Concern  . Not on file  Social History Narrative  . Not on file   Social Determinants of Health   Financial Resource Strain:   . Difficulty of Paying Living Expenses: Not on file  Food Insecurity:   .  Worried About Charity fundraiser in the Last Year: Not on file  . Ran Out of Food in the Last Year: Not on file  Transportation Needs:   . Lack of Transportation (Medical): Not on file  . Lack of Transportation (Non-Medical): Not on file  Physical Activity:   . Days of Exercise per Week: Not on file  . Minutes of Exercise per Session: Not on file  Stress:   . Feeling of Stress : Not on file  Social Connections:   . Frequency of Communication with Friends and Family: Not on file  . Frequency of Social Gatherings with Friends and Family: Not on file  . Attends Religious Services: Not on file  . Active Member of Clubs or Organizations: Not on file  . Attends Archivist Meetings: Not on file  . Marital Status: Not on file   Additional Social History:  Patient is a newfound grandmother and really wants to fix her health due to her new grandbaby's.  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  Patiently recently relapsed on crack cocaine.  Allergies:   Allergies  Allergen Reactions  . Ivp Dye [Iodinated Diagnostic Agents] Itching    Patient stated she had itching on her back after IV injection    Labs:  Results for orders placed or performed during the hospital encounter of 03/17/19 (from the past 48 hour(s))  Comprehensive metabolic panel     Status: Abnormal   Collection Time: 03/17/19 12:03 PM  Result Value Ref Range   Sodium 136 135 - 145 mmol/L   Potassium 4.3 3.5 - 5.1 mmol/L   Chloride 105 98 - 111 mmol/L   CO2 20 (L) 22 - 32 mmol/L   Glucose, Bld 375 (H) 70 - 99 mg/dL   BUN 13 6 - 20 mg/dL   Creatinine, Ser 0.70 0.44 - 1.00 mg/dL   Calcium 9.3 8.9 - 10.3 mg/dL   Total Protein 7.4 6.5 - 8.1 g/dL   Albumin 3.7 3.5 - 5.0 g/dL   AST 15 15 - 41 U/L   ALT 15 0 - 44 U/L   Alkaline Phosphatase 129 (H) 38 - 126 U/L   Total Bilirubin 0.4 0.3 - 1.2 mg/dL   GFR calc non Af Amer >60 >60 mL/min    GFR calc Af Amer >60 >60 mL/min   Anion gap 11 5 - 15    Comment: Performed at [facility], [address] [place], [place] [address]  Ethanol     Status: None   Collection Time: 03/17/19 12:03 PM  Result Value Ref  Range   Alcohol, Ethyl (B) <10 <10 mg/dL    Comment: (NOTE) Lowest detectable limit for serum alcohol is 10 mg/dL. For medical purposes only. Performed at [facility], [address] [place], [place] [id number]   Salicylate level     Status: Abnormal   Collection Time: 03/17/19 12:03 PM  Result Value Ref Range   Salicylate Lvl [id number] (L) 7.0 - 30.0 mg/dL    Comment: Performed at [facility], [address] [place], [place] [address]  Acetaminophen level     Status: Abnormal   Collection Time: 03/17/19 12:03 PM  Result Value Ref Range   Acetaminophen (Tylenol), Serum <10 (L) 10 - 30 ug/mL    Comment: (NOTE) Therapeutic concentrations vary significantly. A range of 10-30 ug/mL  may be an effective concentration for many patients. However, some  are best treated at concentrations outside of this range. Acetaminophen concentrations >150 ug/mL at 4 hours after ingestion  and >50 ug/mL at 12 hours after ingestion are often associated with  toxic reactions. Performed at [facility], [address] [place], [place] [address]   cbc     Status: Abnormal   Collection Time: 03/17/19 12:03 PM  Result Value Ref Range   WBC 10.6 (H) 4.0 - 10.5 K/uL   RBC 4.56 3.87 - 5.11 MIL/uL   Hemoglobin 13.6 12.0 - 15.0 g/dL   HCT 40.2 36.0 - 46.0 %   MCV 88.2 80.0 - 100.0 fL   MCH 29.8 26.0 - 34.0 pg   MCHC 33.8 30.0 - 36.0 g/dL   RDW 12.8 11.5 - 15.5 %   Platelets 248 150 - 400 K/uL   nRBC 0.0 0.0 - 0.2 %    Comment: Performed at [facility], [address], [place],  [address]  Urine Drug Screen, Qualitative     Status: None   Collection Time: 03/17/19 12:09 PM  Result Value Ref Range   Tricyclic, Ur Screen NONE  DETECTED NONE DETECTED   Amphetamines, Ur Screen NONE DETECTED NONE DETECTED   MDMA (Ecstasy)Ur Screen NONE DETECTED NONE DETECTED   Cocaine Metabolite,Ur [place] NONE DETECTED NONE DETECTED   Opiate, Ur Screen NONE DETECTED NONE DETECTED   Phencyclidine (PCP) Ur S NONE DETECTED NONE DETECTED   Cannabinoid 50 Ng, Ur [place] NONE DETECTED NONE DETECTED   Barbiturates, Ur Screen NONE DETECTED NONE DETECTED   Benzodiazepine, Ur Scrn NONE DETECTED NONE DETECTED   Methadone Scn, Ur NONE DETECTED NONE DETECTED    Comment: (NOTE) Tricyclics + metabolites, urine    Cutoff 1000 ng/mL Amphetamines + metabolites, urine  Cutoff 1000 ng/mL MDMA (Ecstasy), urine              Cutoff 500 ng/mL Cocaine Metabolite, urine          Cutoff 300 ng/mL Opiate + metabolites, urine        Cutoff 300 ng/mL Phencyclidine (PCP), urine         Cutoff 25 ng/mL Cannabinoid, urine                 Cutoff 50 ng/mL Barbiturates + metabolites, urine  Cutoff 200 ng/mL Benzodiazepine, urine              Cutoff 200 ng/mL Methadone, urine                   Cutoff 300 ng/mL The urine drug screen provides only a preliminary, unconfirmed analytical test result and should not be used for non-medical  purposes. Clinical consideration and professional judgment should be applied to any positive drug screen result due to possible interfering substances. A more specific alternate chemical method must be used in order to obtain a confirmed analytical result. Gas chromatography / mass spectrometry (GC/MS) is the preferred confirmat ory method. Performed at [facility], [address] [place], [place] [address]     Current Facility-Administered Medications  Medication Dose Route Frequency Provider Last Rate Last Admin  . insulin aspart (novoLOG) injection 0-15 Units  0-15 Units Subcutaneous TID WC Lilia Pro., MD      . insulin aspart (novoLOG) injection 0-5 Units  0-5 Units Subcutaneous QHS Lilia Pro., MD      .  LORazepam (ATIVAN) tablet 1 mg  1 mg Oral Once Lilia Pro., MD       Current Outpatient Medications  Medication Sig Dispense Refill  . atorvastatin (LIPITOR) 10 MG tablet Take 1 tablet (10 mg total) by mouth daily at 6 PM. 30 tablet 1  . cyclobenzaprine (FLEXERIL) 5 MG tablet Take 1 tablet (5 mg total) by mouth 3 (three) times daily as needed for muscle spasms. 60 tablet 1  . DULoxetine (CYMBALTA) 60 MG capsule Take 1 capsule by mouth once daily 30 capsule 0  . gabapentin (NEURONTIN) 400 MG capsule Take 1 capsule (400 mg total) by mouth 3 (three) times daily. 30 capsule 0  . HYDROcodone-acetaminophen (NORCO) 5-325 MG tablet Take 1 tablet by mouth every 4 (four) hours as needed for moderate pain. 6 tablet 0  . ibuprofen (ADVIL) 600 MG tablet Take 1 tablet (600 mg total) by mouth every 6 (six) hours as needed. 60 tablet 0  . insulin glargine (LANTUS) 100 UNIT/ML injection Inject 0.2 mLs (20 Units total) into the skin at bedtime. 10 mL 1  . metFORMIN (GLUCOPHAGE) 1000 MG tablet Take 1 tablet (1,000 mg total) by mouth 2 (two) times daily with a meal. 60 tablet 1  . pantoprazole (PROTONIX) 40 MG tablet Take 1 tablet (40 mg total) by mouth daily. 30 tablet 1  . traZODone (DESYREL) 150 MG tablet TAKE 1 TABLET BY MOUTH AT BEDTIME 30 tablet 0    Musculoskeletal: Strength & Muscle Tone: within normal limits Gait & Station: normal Patient leans: N/A  Psychiatric Specialty Exam: Physical Exam  Review of Systems  Psychiatric/Behavioral: Positive for decreased concentration, dysphoric mood, sleep disturbance and suicidal ideas. Negative for agitation, behavioral problems, confusion, hallucinations and self-injury. The patient is nervous/anxious and is hyperactive.     Blood pressure (!) 194/106, pulse 98, temperature 98.7 F (37.1 C), temperature source Oral, resp. rate 20, height 5\' 7"  (1.702 m), weight 81.6 kg, last menstrual period 07/22/2016, SpO2 99 %.Body mass index is 28.19 kg/m.  General  Appearance: Fairly Groomed  Eye Contact:  Good  Speech:  Pressured  Volume:  Increased  Mood:  Anxious, Depressed, Dysphoric and Hopeless  Affect:  Congruent and Depressed  Thought Process:  Linear  Orientation:  Full (Time, Place, and Person)  Thought Content:  Logical and Rumination  Suicidal Thoughts:  Yes.  without intent/plan  Homicidal Thoughts:  No  Memory:  Recent;   Poor  Judgement:  Poor  Insight:  Good  Psychomotor Activity:  Restlessness  Concentration:  Concentration: Poor  Recall:  [employer] of Knowledge:  Fair  Language:  Fair  Akathisia:  No  Handed:  Right  AIMS (if indicated):     Assets:  Desire for Improvement Housing Leisure Time Social Support  Vocational/Educational  ADL's:  Intact  Cognition:  WNL  Sleep:        Treatment Plan Summary: Patient is a 54 year old female with a history of bipolar 1 disorder and presents with an mixed manic episode with depression and SI.  Patient has a history of medical noncompliance and substance abuse addiction with recent relapse.  Patient will benefit from inpatient admittance for monitoring, medication management, and safety.  Diagnosis: Bipolar 1 current episode mixed.  Medications: Ativan 2 mg administered in the ED. Will refer medication management to unit provider. Disposition: Recommend psychiatric Inpatient admission when medically cleared. Supportive therapy provided about ongoing stressors.  Dixie Dials, MD 03/17/2019 3:32 PM

## 2019-03-17 NOTE — ED Provider Notes (Addendum)
[facility] Emergency Department Provider Note  ____________________________________________   First MD Initiated Contact with Patient 03/17/19 1209     (approximate)  I have reviewed the triage vital signs and the nursing notes.  History  Chief Complaint Psychiatric Evaluation    HPI Julie Lambert is a 54 y.o. female with history of DM, GERD, bipolar, cocaine use, who presents emergency department for psychiatric evaluation, detox from crack cocaine use.   Patient states she was previously on medications for her bipolar disorder, but didn't feel like it was helping - still felt like she fluctuated with highs/lows. She is reportedly out of all her psychiatric medications, and was trying to "stretch them out."   With regards to her substance use - she states she can do well for a few months at a time with regards to her sobriety but then always seems to relapse. Last relapsed a few days ago, admits to use on Monday. Feels like she is detoxing - feels anxious, jittery. Denies any other substance use.   She states "there's something wrong, I'm not doing good". Tearful, but denies SI or HI. She is interested in receiving help and presents voluntarily.     Past Medical Hx Past Medical History:  Diagnosis Date  . Bipolar 2 disorder ([place])   . Diabetes mellitus without complication ([place])   . Diverticulitis   . Diverticulosis   . GERD (gastroesophageal reflux disease)   . Sciatica   . Substance abuse ([place])   . Uterine fibroid     Problem List Patient Active Problem List   Diagnosis Date Noted  . Cocaine abuse with cocaine-induced mood disorder ([place]) 10/10/2018  . Major depression, recurrent ([place]) 09/03/2018  . Special screening for malignant neoplasms, colon 04/06/2018  . Liver hemangioma 03/01/2018  . Uncontrolled type 2 diabetes mellitus with hyperglycemia ([place]) 03/01/2018  . Tobacco use disorder 01/04/2018  . Suicidal ideation 01/04/2018  .  Bipolar I disorder, most recent episode depressed, severe without psychotic features ([place]) 01/02/2018  . Diabetes mellitus without complication ([place]) 0000000  . Severe alcohol use disorder ([place]) 12/25/2017  . Cocaine use disorder, moderate, dependence ([place]) 12/25/2017    Past Surgical Hx Past Surgical History:  Procedure Laterality Date  . BACK SURGERY    . CHOLECYSTECTOMY    . LUMBAR SPINE SURGERY      Medications Prior to Admission medications   Medication Sig Start Date End Date Taking? Authorizing Provider  atorvastatin (LIPITOR) 10 MG tablet Take 1 tablet (10 mg total) by mouth daily at 6 PM. 09/06/18   Clapacs, [name], MD  cyclobenzaprine (FLEXERIL) 5 MG tablet Take 1 tablet (5 mg total) by mouth 3 (three) times daily as needed for muscle spasms. 09/06/18   Clapacs, [name], MD  DULoxetine (CYMBALTA) 60 MG capsule Take 1 capsule by mouth once daily 01/21/19   Clapacs, [name], MD  gabapentin (NEURONTIN) 400 MG capsule Take 1 capsule (400 mg total) by mouth 3 (three) times daily. 12/08/18   Merlyn Lot, MD  HYDROcodone-acetaminophen (NORCO) 5-325 MG tablet Take 1 tablet by mouth every 4 (four) hours as needed for moderate pain. 12/08/18   Merlyn Lot, MD  ibuprofen (ADVIL) 600 MG tablet Take 1 tablet (600 mg total) by mouth every 6 (six) hours as needed. 12/08/18   Merlyn Lot, MD  insulin glargine (LANTUS) 100 UNIT/ML injection Inject 0.2 mLs (20 Units total) into the skin at bedtime. 09/09/18   Clapacs, [name], MD  metFORMIN (GLUCOPHAGE)  1000 MG tablet Take 1 tablet (1,000 mg total) by mouth 2 (two) times daily with a meal. 09/06/18   Clapacs, [name], MD  pantoprazole (PROTONIX) 40 MG tablet Take 1 tablet (40 mg total) by mouth daily. 09/07/18   Clapacs, [name], MD  traZODone (DESYREL) 150 MG tablet TAKE 1 TABLET BY MOUTH AT BEDTIME 01/21/19   Clapacs, [name], MD    Allergies Ivp dye [iodinated diagnostic agents]  Family Hx Family History  Problem Relation Age  of Onset  . CAD Mother   . Diabetes Mother   . Heart disease Mother   . Parkinson's disease Mother   . Kidney disease Mother   . Cancer Father        prostate cancer  . Breast cancer Maternal Aunt   . Breast cancer Maternal Grandmother     Social Hx Social History   Tobacco Use  . Smoking status: Current Every Day Smoker    Packs/day: 0.25    Years: 40.00    Pack years: 10.00    Types: Cigarettes  . Smokeless tobacco: Never Used  Substance Use Topics  . Alcohol use: Not Currently    Comment: last use 7/22 3 40oz   . Drug use: Not Currently    Types: "Crack" cocaine    Comment: last use 7/22, smoked crack     Review of Systems  Constitutional: Negative for fever, chills. Eyes: Negative for visual changes. ENT: Negative for sore throat. Cardiovascular: Negative for chest pain. Respiratory: Negative for shortness of breath. Gastrointestinal: Negative for nausea, vomiting.  Genitourinary: Negative for dysuria. Musculoskeletal: Negative for leg swelling. Skin: Negative for rash. Neurological: Negative for for headaches.   Physical Exam  Vital Signs: ED Triage Vitals  Enc Vitals Group     BP 03/17/19 1154 (!) 194/106     Pulse Rate 03/17/19 1154 98     Resp 03/17/19 1154 20     Temp 03/17/19 1154 98.7 F (37.1 C)     Temp Source 03/17/19 1154 Oral     SpO2 03/17/19 1154 99 %     Weight 03/17/19 1155 180 lb (81.6 kg)     Height 03/17/19 1155 5\' 7"  (1.702 m)     Head Circumference --      Peak Flow --      Pain Score 03/17/19 1155 9     Pain Loc --      Pain Edu? --      Excl. in [place]? --     Constitutional: Alert and oriented.  Head: Normocephalic. Atraumatic. Eyes: Conjunctivae clear. Sclera anicteric. Nose: No congestion. No rhinorrhea. Mouth/Throat: Mucous membranes are moist.  Neck: No stridor.   Cardiovascular: Normal rate. Extremities well perfused. Respiratory: Normal respiratory effort.   Gastrointestinal: Non-distended.  Musculoskeletal: No  deformities. Neurologic:  Normal speech and language. No gross focal neurologic deficits are appreciated.  Skin: Skin is warm, dry and intact.  Psychiatric: Tearful, somewhat anxious.  EKG  N/A    Radiology  N/A   Procedures  Procedure(s) performed (including critical care):  Procedures   Initial Impression / Assessment and Plan / ED Course  54 y.o. female who presents to the ED for cocaine detox, anxiety, psychiatric evaluation.  DDx: cocaine induced mood disorder, bipolar disorder, depression, anxiety.  Will obtain basic screening labs and consult psychiatry and TTS.   Labs w/o actionable derangements. No evidence of underlying acute metabolic or infectious etiology. Awaiting psychiatry/TTS evaluation and recommendations.  Psychiatry recommending admission here. Will  obtain screening COVID swab. Patient aware of plan and remains agreeable and voluntary.    Final Clinical Impression(s) / ED Diagnosis  Final diagnoses:  Anxiety  Substance use       Note:  This document was prepared using Dragon voice recognition software and may include unintentional dictation errors.     Lilia Pro., MD 03/17/19 [phone]

## 2019-03-17 NOTE — ED Notes (Signed)
1 yellow ankle bracelet, 1 gray necklace, 1 pair earrings with white stones, 1 hair tie,  1 pair tennis shoes, 1 pair black leggings, 1 pair underwear, 1 white long sleeve t-shirt, 1 bra. 1 black and pink print bag.  Pt dressed into burgundy scrubs by this RN and Mickel Baas, EDT.

## 2019-03-18 ENCOUNTER — Encounter: Payer: Self-pay | Admitting: Psychiatry

## 2019-03-18 ENCOUNTER — Other Ambulatory Visit: Payer: Self-pay

## 2019-03-18 ENCOUNTER — Inpatient Hospital Stay
Admission: RE | Admit: 2019-03-18 | Discharge: 2019-03-21 | DRG: 885 | Disposition: A | Discharge status: Home / Self Care | Payer: Self-pay | Source: Intra-hospital | Attending: Psychiatry | Admitting: Psychiatry

## 2019-03-18 DIAGNOSIS — G4700 Insomnia, unspecified: Secondary | ICD-10-CM | POA: Diagnosis present

## 2019-03-18 DIAGNOSIS — Z794 Long term (current) use of insulin: Secondary | ICD-10-CM

## 2019-03-18 DIAGNOSIS — R45851 Suicidal ideations: Secondary | ICD-10-CM | POA: Diagnosis present

## 2019-03-18 DIAGNOSIS — E119 Type 2 diabetes mellitus without complications: Secondary | ICD-10-CM | POA: Diagnosis present

## 2019-03-18 DIAGNOSIS — K219 Gastro-esophageal reflux disease without esophagitis: Secondary | ICD-10-CM | POA: Diagnosis present

## 2019-03-18 DIAGNOSIS — F419 Anxiety disorder, unspecified: Secondary | ICD-10-CM | POA: Diagnosis present

## 2019-03-18 DIAGNOSIS — F3163 Bipolar disorder, current episode mixed, severe, without psychotic features: Principal | ICD-10-CM | POA: Diagnosis present

## 2019-03-18 DIAGNOSIS — M797 Fibromyalgia: Secondary | ICD-10-CM | POA: Diagnosis present

## 2019-03-18 DIAGNOSIS — F17210 Nicotine dependence, cigarettes, uncomplicated: Secondary | ICD-10-CM | POA: Diagnosis present

## 2019-03-18 LAB — GLUCOSE, CAPILLARY
Glucose-Capillary: 307 mg/dL — ABNORMAL HIGH (ref 70–99)
Glucose-Capillary: 247 mg/dL — ABNORMAL HIGH (ref 70–99)
Glucose-Capillary: 183 mg/dL — ABNORMAL HIGH (ref 70–99)
Glucose-Capillary: 189 mg/dL — ABNORMAL HIGH (ref 70–99)

## 2019-03-18 LAB — HEMOGLOBIN A1C
Hgb A1c MFr Bld: 10 % — ABNORMAL HIGH (ref 4.8–5.6)
Mean Plasma Glucose: 240.3 mg/dL

## 2019-03-18 MED ORDER — ACETAMINOPHEN 500 MG PO TABS
ORAL_TABLET | ORAL | Status: AC
  Filled 2019-03-18: qty 2

## 2019-03-18 MED ORDER — ONDANSETRON HCL 4 MG PO TABS
4.0000 mg | ORAL_TABLET | Freq: Once | ORAL | Status: AC
  Administered 2019-03-18: 14:00:00 4 mg via ORAL
  Filled 2019-03-18: qty 1

## 2019-03-18 MED ORDER — METFORMIN HCL 500 MG PO TABS
1000.0000 mg | ORAL_TABLET | Freq: Two times a day (BID) | ORAL | Status: DC
  Administered 2019-03-19 – 2019-03-21 (×5): 1000 mg via ORAL
  Filled 2019-03-18 (×5): qty 2

## 2019-03-18 MED ORDER — ACETAMINOPHEN 325 MG PO TABS: 650.0000 mg | ORAL_TABLET | Freq: Four times a day (QID) | ORAL | Status: DC | PRN

## 2019-03-18 MED ORDER — ATORVASTATIN CALCIUM 20 MG PO TABS
10.0000 mg | ORAL_TABLET | Freq: Every day | ORAL | Status: DC
  Administered 2019-03-19 – 2019-03-20 (×2): 10 mg via ORAL
  Filled 2019-03-18 (×2): qty 1

## 2019-03-18 MED ORDER — LORAZEPAM 2 MG PO TABS
2.0000 mg | ORAL_TABLET | Freq: Once | ORAL | Status: AC
  Administered 2019-03-18: 15:00:00 2 mg via ORAL
  Filled 2019-03-18: qty 1

## 2019-03-18 MED ORDER — PROMETHAZINE HCL 25 MG PO TABS
25.0000 mg | ORAL_TABLET | Freq: Four times a day (QID) | ORAL | Status: DC | PRN
  Administered 2019-03-18: 25 mg via ORAL
  Filled 2019-03-18: qty 1

## 2019-03-18 MED ORDER — TRAZODONE HCL 50 MG PO TABS
150.0000 mg | ORAL_TABLET | Freq: Every day | ORAL | Status: DC
  Administered 2019-03-18 – 2019-03-20 (×3): 150 mg via ORAL
  Filled 2019-03-18 (×3): qty 1

## 2019-03-18 MED ORDER — IBUPROFEN 600 MG PO TABS: 600.0000 mg | ORAL_TABLET | Freq: Four times a day (QID) | ORAL | Status: DC | PRN

## 2019-03-18 MED ORDER — ONDANSETRON 4 MG PO TBDP
4.0000 mg | ORAL_TABLET | Freq: Once | ORAL | Status: AC
  Administered 2019-03-18: 21:00:00 4 mg via ORAL
  Filled 2019-03-18: qty 1

## 2019-03-18 MED ORDER — GABAPENTIN 600 MG PO TABS
300.0000 mg | ORAL_TABLET | Freq: Once | ORAL | Status: AC
  Administered 2019-03-18: 21:00:00 300 mg via ORAL
  Filled 2019-03-18: qty 1

## 2019-03-18 MED ORDER — NICOTINE 21 MG/24HR TD PT24
21.0000 mg | MEDICATED_PATCH | Freq: Once | TRANSDERMAL | Status: DC
  Administered 2019-03-18: 14:00:00 21 mg via TRANSDERMAL
  Filled 2019-03-18: qty 1

## 2019-03-18 MED ORDER — ACETAMINOPHEN 500 MG PO TABS
1000.0000 mg | ORAL_TABLET | Freq: Once | ORAL | Status: AC
  Administered 2019-03-18: 21:00:00 1000 mg via ORAL
  Filled 2019-03-18: qty 2

## 2019-03-18 MED ORDER — ALUM & MAG HYDROXIDE-SIMETH 200-200-20 MG/5ML PO SUSP: 30.0000 mL | ORAL | Status: DC | PRN

## 2019-03-18 MED ORDER — ACETAMINOPHEN 500 MG PO TABS
1000.0000 mg | ORAL_TABLET | Freq: Once | ORAL | Status: AC
  Administered 2019-03-18: 09:00:00 1000 mg via ORAL

## 2019-03-18 MED ORDER — GABAPENTIN 400 MG PO CAPS
400.0000 mg | ORAL_CAPSULE | Freq: Three times a day (TID) | ORAL | Status: DC
  Administered 2019-03-19 – 2019-03-21 (×8): 400 mg via ORAL
  Filled 2019-03-18 (×8): qty 1

## 2019-03-18 MED ORDER — MAGNESIUM HYDROXIDE 400 MG/5ML PO SUSP: 30.0000 mL | Freq: Every day | ORAL | Status: DC | PRN

## 2019-03-18 NOTE — Progress Notes (Addendum)
Inpatient Diabetes Program Recommendations  AACE/ADA: New Consensus Statement on Inpatient Glycemic Control (2015)  Target Ranges:  Prepandial:   less than 140 mg/dL      Peak postprandial:   less than 180 mg/dL (1-2 hours)      Critically ill patients:  140 - 180 mg/dL   Results for Julie Lambert, Julie Lambert (MRN [id number]) as of 03/18/2019 07:33  Ref. Range 03/17/2019 17:14 03/17/2019 19:48 03/17/2019 23:09 03/18/2019 07:05  Glucose-Capillary Latest Ref Range: 70 - 99 mg/dL 349 (H)  11 units NOVOLOG  358 (H)  5 units NOVOLOG given at 9:27pm 287 (H)    10 units LEVEMIR 307 (H)   Results for [name], [name] (MRN [id number]) as of 03/18/2019 07:33  Ref. Range 03/19/2018 12:18 03/17/2019 12:03  Hemoglobin A1C Latest Ref Range: 4.8 - 5.6 % 8.2 (H) 10.0 (H)  (240 mg/dl)    Admit with: Depression and SI  History: DM2  Home DM Meds: Lantus 20 units QHS       Metformin 1000 mg BID  Current Orders: Levemir 10 units QHS       Novolog Resistant Correction Scale/ SSI (0-20 units) TID AC + HS    Per MD notes, patient has been: "stretching out her meds to try to make them last as long as possible"--Current A1c of 10% is reflective of this statement.  Unsure where patient is getting meds??  Metformin is inexpensive, however, Lantus is expensive without insurance.     MD- Please consider the following in-hospital insulin adjustments:  1. Change Basal Insulin to Lantus  2. Increase Basal insulin dose to 20 units QHS (0.25 units/kg)  3. Start Novolog Meal Coverage: Novolog 4 units TID with meals  (Please add the following Hold Parameters: Hold if pt eats <50% of meal, Hold if pt NPO)    Addendum 12:30pm-- Met w/ pt down in the Psych ED.  Pt was crying and rocking back and forth.  Able to answer a few of my questions.  [facility] back in August of last year b/c she had insurance but now does NOT have insurance coverage.  Needs help refiling an  application for assistance with meds at the Med Management clinic.  Does not take the Metformin b/c she stated she gets a rash on her legs from the Metformin.  Not taking Lantus on a regular basis b/c she can't afford and has to stretch the doses out.  Does NOT have CBG meter either.  Have placed [facility] consult for getting pt assistance w/ re-application for Med Mgmt clinic.  Pt currently awaiting placement on the Psych unit.    --Will follow patient during hospitalization--  Wyn Quaker RN, MSN, CDE Diabetes Coordinator Inpatient Glycemic Control Team Team Pager: [phone] (8a-5p)

## 2019-03-18 NOTE — BH Assessment (Signed)
PATIENT BED AVAILABLE AFTER 9:30PM 03/18/19 pending glucose levels remain stable  Patient is to be admitted to [facility] by Dr. Ainsley Spinner.  Attending Physician will be Dr. Weber Cooks.   Patient has been assigned to room 305, by [facility] Charge Nurse [place].   Intake Paper Work has been signed and placed on patient chart.  ER staff is aware of the admission:  [facility] ER Secretary    Dr. Elvera Bicker, ER MD   Lelon Frohlich Patient's Nurse   Patient has already been pre-admitted

## 2019-03-18 NOTE — ED Notes (Signed)
ED BHU  Is the patient under IVC or is there intent for IVC:  voluntary Is the patient medically cleared: Yes.   Is there vacancy in the ED BHU: Yes.   Is the population mix appropriate for patient: Yes.   Is the patient awaiting placement in inpatient or outpatient setting: Yes. She has an inpatient bed assigned - she must wait for admission     Has the patient had a psychiatric consult: Yes.   Survey of unit performed for contraband, proper placement and condition of furniture, tampering with fixtures in bathroom, shower, and each patient room: Yes.  ; Findings:  APPEARANCE/BEHAVIOR Calm and cooperative NEURO ASSESSMENT Orientation: oriented x3  Denies pain Hallucinations: No.None noted (Hallucinations) denies Speech: Normal Gait: normal RESPIRATORY ASSESSMENT Even  Unlabored respirations  CARDIOVASCULAR ASSESSMENT Pulses equal   regular rate  Skin warm and dry   GASTROINTESTINAL ASSESSMENT no GI complaint EXTREMITIES Full ROM  PLAN OF CARE Provide calm/safe environment. Vital signs assessed twice daily. ED BHU Assessment once each 12-hour shift.  Assure the ED provider has rounded once each shift. Provide and encourage hygiene. Provide redirection as needed. Assess for escalating behavior; address immediately and inform ED provider.  Assess family dynamic and appropriateness for visitation as needed: Yes.  ; If necessary, describe findings:  Educate the patient/family about BHU procedures/visitation: Yes.  ; If necessary, describe findings:

## 2019-03-18 NOTE — ED Notes (Signed)
VOL/Pending Placement 

## 2019-03-18 NOTE — ED Notes (Signed)
Esig obtained on paper due to unable to get esig pad to work.

## 2019-03-18 NOTE — ED Provider Notes (Signed)
-----------------------------------------   5:06 AM on 03/18/2019 -----------------------------------------   Blood pressure 122/83, pulse 94, temperature 98.7 F (37.1 C), temperature source Oral, resp. rate 20, height 1.702 m (5\' 7" ), weight 81.6 kg, last menstrual period 07/22/2016, SpO2 99 %.  The patient is calm and cooperative at this time.  I received word from TTS that the patient will be admitted to [facility] behavioral health unit later today.   Hinda Kehr, MD 03/18/19 [phone]

## 2019-03-18 NOTE — Plan of Care (Signed)
Patient new to the unit  Problem: Education: Goal: Knowledge of [place] [facility] Education information/materials will improve Outcome: Not Progressing Goal: Emotional status will improve Outcome: Not Progressing Goal: Mental status will improve Outcome: Not Progressing Goal: Verbalization of understanding the information provided will improve Outcome: Not Progressing   Problem: Safety: Goal: Periods of time without injury will increase Outcome: Not Progressing   Problem: Education: Goal: Utilization of techniques to improve thought processes will improve Outcome: Not Progressing Goal: Knowledge of the prescribed therapeutic regimen will improve Outcome: Not Progressing   Problem: Safety: Goal: Ability to disclose and discuss suicidal ideas will improve Outcome: Not Progressing

## 2019-03-19 DIAGNOSIS — F332 Major depressive disorder, recurrent severe without psychotic features: Secondary | ICD-10-CM

## 2019-03-19 LAB — HEPATIC FUNCTION PANEL
ALT: 16 U/L (ref 0–44)
AST: 19 U/L (ref 15–41)
Albumin: 3.7 g/dL (ref 3.5–5.0)
Alkaline Phosphatase: 117 U/L (ref 38–126)
Bilirubin, Direct: <0.1 mg/dL (ref 0.0–0.2)
Total Bilirubin: 0.6 mg/dL (ref 0.3–1.2)
Total Protein: 7.4 g/dL (ref 6.5–8.1)

## 2019-03-19 LAB — TSH: TSH: 1.283 u[IU]/mL (ref 0.350–4.500)

## 2019-03-19 LAB — GLUCOSE, CAPILLARY
Glucose-Capillary: 276 mg/dL — ABNORMAL HIGH (ref 70–99)
Glucose-Capillary: 266 mg/dL — ABNORMAL HIGH (ref 70–99)
Glucose-Capillary: 254 mg/dL — ABNORMAL HIGH (ref 70–99)
Glucose-Capillary: 248 mg/dL — ABNORMAL HIGH (ref 70–99)

## 2019-03-19 LAB — LIPID PANEL
Cholesterol: 253 mg/dL — ABNORMAL HIGH (ref 0–200)
HDL: 72 mg/dL (ref >40)
LDL Cholesterol: 130 mg/dL — ABNORMAL HIGH (ref 0–99)
Total CHOL/HDL Ratio: 3.5 RATIO
Triglycerides: 256 mg/dL — ABNORMAL HIGH (ref <150)
VLDL: 51 mg/dL — ABNORMAL HIGH (ref 0–40)

## 2019-03-19 MED ORDER — DULOXETINE HCL 30 MG PO CPEP
60.0000 mg | ORAL_CAPSULE | Freq: Two times a day (BID) | ORAL | Status: DC
  Administered 2019-03-19 – 2019-03-21 (×5): 60 mg via ORAL
  Filled 2019-03-19 (×5): qty 2

## 2019-03-19 MED ORDER — INSULIN GLARGINE 100 UNIT/ML ~~LOC~~ SOLN
20.0000 [IU] | Freq: Every day | SUBCUTANEOUS | Status: DC
  Administered 2019-03-19 – 2019-03-20 (×2): 20 [IU] via SUBCUTANEOUS
  Filled 2019-03-19 (×3): qty 0.2

## 2019-03-19 MED ORDER — HYDROXYZINE HCL 25 MG PO TABS
25.0000 mg | ORAL_TABLET | Freq: Three times a day (TID) | ORAL | Status: DC
  Administered 2019-03-19 – 2019-03-21 (×7): 25 mg via ORAL
  Filled 2019-03-19 (×7): qty 1

## 2019-03-19 NOTE — BHH Suicide Risk Assessment (Signed)
[facility] Admission Suicide Risk Assessment   Nursing information obtained from:  Patient Demographic factors:  NA Current Mental Status:  NA Loss Factors:  NA Historical Factors:  Impulsivity, Anniversary of important loss Risk Reduction Factors:  Positive social support  Total Time spent with patient: 45 minutes Principal Problem: <principal problem not specified> Diagnosis:  Active Problems:   Bipolar 1 disorder, mixed, severe (HCC)  Subjective Data: Admitted for severity of depressive symptoms and relapse on cocaine  Continued Clinical Symptoms:  Alcohol Use Disorder Identification Test Final Score (AUDIT): 0 The "Alcohol Use Disorders Identification Test", Guidelines for Use in Primary Care, Second Edition.  World Pharmacologist [facility]). Score between 0-7:  no or low risk or alcohol related problems. Score between 8-15:  moderate risk of alcohol related problems. Score between 16-19:  high risk of alcohol related problems. Score 20 or above:  warrants further diagnostic evaluation for alcohol dependence and treatment. Musculoskeletal: Strength & Muscle Tone: within normal limits Gait & Station: normal Patient leans: N/A  Psychiatric Specialty Exam: Physical Exam  Nursing note and vitals reviewed. Constitutional: She appears well-developed and well-nourished.    Review of Systems  Constitutional: Negative.   Eyes: Negative.   Respiratory: Negative.   Endocrine: Negative.   Genitourinary: Negative.  Genital sores:    Neurological: Negative.    diabetes  Blood pressure (!) 146/99, pulse 95, temperature 98.8 F (37.1 C), temperature source Oral, resp. rate 17, height 5\' 7"  (1.702 m), weight 81.6 kg, last menstrual period 07/22/2016, SpO2 100 %.Body mass index is 28.18 kg/m.  General Appearance: Casual  Eye Contact:  Good  Speech:  Clear and Coherent  Volume:  Normal  Mood:  Anxious and Depressed  Affect:  Appropriate and Congruent  Thought Process:  Coherent and  Goal Directed  Orientation:  Full (Time, Place, and Person)  Thought Content:  No psychosis no mania no racing thoughts  Suicidal Thoughts:  Yes.  without intent/plan  Homicidal Thoughts:  No  Memory:  Immediate;   Fair Recent;   Fair Remote;   Fair  Judgement:  Good  Insight:  Good  Psychomotor Activity:  Normal  Concentration:  Concentration: Good and Attention Span: Good  Recall:  Good  Fund of Knowledge:  Good  Language:  Good  Akathisia:  Negative  Handed:  Right  AIMS (if indicated):     Assets:  Leisure Time Physical Health Resilience Social Support  ADL's:  Intact  Cognition:  WNL  Sleep:  Number of Hours: 7.5     CLINICAL FACTORS:   Alcohol/Substance Abuse/Dependencies       COGNITIVE FEATURES THAT CONTRIBUTE TO RISK:  None    SUICIDE RISK:   Mild:  Suicidal ideation of limited frequency, intensity, duration, and specificity.  There are no identifiable plans, no associated intent, mild dysphoria and related symptoms, good self-control (both objective and subjective assessment), few other risk factors, and identifiable protective factors, including available and accessible social support.  PLAN OF CARE: Admit for med management and stabilization  I certify that inpatient services furnished can reasonably be expected to improve the patient's condition.   Johnn Hai, MD 03/19/2019, 1:49 PM

## 2019-03-19 NOTE — BHH Group Notes (Signed)
[place] LCSW Group Therapy Note  Date/Time:  03/19/2019  1:00PM  Type of Therapy and Topic:  Group Therapy:  Healthy and Unhealthy Supports  Participation Level:  Did Not Attend   Description of Group:  Patients in this group were introduced to the idea of adding a variety of healthy supports to address the various needs in their lives.Patients discussed what additional healthy supports could be helpful in their recovery and wellness after discharge in order to prevent future hospitalizations.   An emphasis was placed on using counselor, doctor, therapy groups, 12-step groups, and problem-specific support groups to expand supports.  They also worked as a group on developing a specific plan for several patients to deal with unhealthy supports through [place], psychoeducation with loved ones, and even termination of relationships.   Therapeutic Goals:   1)  discuss importance of adding supports to stay well once out of the hospital  2)  compare healthy versus unhealthy supports and identify some examples of each  3)  generate ideas and descriptions of healthy supports that can be added  4)  offer mutual support about how to address unhealthy supports  5)  encourage active participation in and adherence to discharge plan    Summary of Patient Progress:    Patient did not attend group   Therapeutic Modalities:   [place], MSW, LCSW Netta Neat, MSW, LCSW Clinical Social Work

## 2019-03-19 NOTE — BHH Suicide Risk Assessment (Signed)
[place] INPATIENT:  Family/Significant Other Suicide Prevention Education  Suicide Prevention Education:   Patient Refusal for Family/Significant Other Suicide Prevention Education: The patient Julie Lambert has refused to provide written consent for family/significant other to be provided Family/Significant Other Suicide Prevention Education during admission and/or prior to discharge.  Physician notified.    Netta Neat, MSW, LCSW Clinical Social Work 03/19/2019, 3:21 PM

## 2019-03-19 NOTE — Progress Notes (Signed)
D: Bipolar   A:Patient stated she has been off her medication for awhile . Stated she has had periods of mood swings  From crying to yelling. Stated she needs to get back on her medications  Writer instructed patient on restart of Cymbalta. Patient stated slept poor last night .Stated appetite poor, noted to get very good at breakfast  This am . and energy level  Iow. Stated concentration  good . Stated on Depression scale 9 , hopeless 5 and anxiety 10 .( low 0-10 high) Denies suicidal  homicidal ideations  .  No auditory hallucinations  No pain concerns . Appropriate ADL'S. Interacting with peers and staff.  Encourage patient participation with unit programming . Instruction  Given on  Medication , verbalize understanding.Patient knowledgeable of information received, able to understand information received  . Emotional and mental  status improved . Voice of no  safety concerns . Encourage group process , to verbalize feelings . Verbalize understanding of medication received.Denies suicidal ideations   R: Voice no other concerns. Staff continue to monitor

## 2019-03-19 NOTE — Progress Notes (Signed)
Brief Nutrition Education Note RD working remotely.  RD consulted for nutrition education regarding diabetes. RD working remotely. Education handout has been attached to patient discharge instructions for review.  Lab Results  Component Value Date   HGBA1C 10.0 (H) 03/17/2019    RD provided "Heart Healthy Consistent Carbohydrate" handout from the Academy of Nutrition and Dietetics. Handout discusses different food groups and their effects on blood sugar, emphasizing carbohydrate-containing foods. Provides a list of carbohydrates and recommended serving sizes of common foods.  Handout discussed importance of controlled and consistent carbohydrate intake throughout the day. Provides examples of ways to balance meals/snacks and encourages intake of high-fiber, whole grain complex carbohydrates.   Labs and medications reviewed. No further nutrition interventions warranted at this time. If additional nutrition issues arise, please re-consult RD.  Lajuan Lines, RD, LDN Clinical Nutrition [place] Telephone [phone] After Hours/Weekend Pager: [phone]
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
## 2019-03-19 NOTE — Tx Team (Signed)
Interdisciplinary Treatment and Diagnostic Plan Update  03/19/2019 Time of Session: 9:15AM Julie Lambert MRN: [id number]  Principal Diagnosis: <principal problem not specified>  Secondary Diagnoses: Active Problems:   Bipolar 1 disorder, mixed, severe (HCC)   Current Medications:  Current Facility-Administered Medications  Medication Dose Route Frequency Provider Last Rate Last Admin  . acetaminophen (TYLENOL) tablet 650 mg  650 mg Oral Q6H PRN Cristofano, Dorene Ar, MD      . alum & mag hydroxide-simeth (MAALOX/MYLANTA) 200-200-20 MG/5ML suspension 30 mL  30 mL Oral Q4H PRN Cristofano, Paul A, MD      . atorvastatin (LIPITOR) tablet 10 mg  10 mg Oral q1800 Cristofano, Paul A, MD      . gabapentin (NEURONTIN) capsule 400 mg  400 mg Oral TID Dixie Dials, MD   400 mg at 03/19/19 0751  . ibuprofen (ADVIL) tablet 600 mg  600 mg Oral Q6H PRN Cristofano, Paul A, MD      . magnesium hydroxide (MILK OF MAGNESIA) suspension 30 mL  30 mL Oral Daily PRN Cristofano, Paul A, MD      . metFORMIN (GLUCOPHAGE) tablet 1,000 mg  1,000 mg Oral BID WC Cristofano, Dorene Ar, MD   1,000 mg at 03/19/19 0751  . promethazine (PHENERGAN) tablet 25 mg  25 mg Oral Q6H PRN Deloria Lair, NP   25 mg at 03/18/19 2319  . traZODone (DESYREL) tablet 150 mg  150 mg Oral QHS Cristofano, Dorene Ar, MD   150 mg at 03/18/19 2125   PTA Medications: Medications Prior to Admission  Medication Sig Dispense Refill Last Dose  . atorvastatin (LIPITOR) 10 MG tablet Take 1 tablet (10 mg total) by mouth daily at 6 PM. 30 tablet 1   . cyclobenzaprine (FLEXERIL) 5 MG tablet Take 1 tablet (5 mg total) by mouth 3 (three) times daily as needed for muscle spasms. 60 tablet 1   . DULoxetine (CYMBALTA) 60 MG capsule Take 1 capsule by mouth once daily 30 capsule 0   . gabapentin (NEURONTIN) 400 MG capsule Take 1 capsule (400 mg total) by mouth 3 (three) times daily. 30 capsule 0   . HYDROcodone-acetaminophen (NORCO) 5-325 MG tablet Take  1 tablet by mouth every 4 (four) hours as needed for moderate pain. 6 tablet 0   . ibuprofen (ADVIL) 600 MG tablet Take 1 tablet (600 mg total) by mouth every 6 (six) hours as needed. 60 tablet 0   . insulin glargine (LANTUS) 100 UNIT/ML injection Inject 0.2 mLs (20 Units total) into the skin at bedtime. 10 mL 1   . metFORMIN (GLUCOPHAGE) 1000 MG tablet Take 1 tablet (1,000 mg total) by mouth 2 (two) times daily with a meal. 60 tablet 1   . pantoprazole (PROTONIX) 40 MG tablet Take 1 tablet (40 mg total) by mouth daily. 30 tablet 1   . traZODone (DESYREL) 150 MG tablet TAKE 1 TABLET BY MOUTH AT BEDTIME (Patient taking differently: Take 150 mg by mouth at bedtime as needed for sleep. ) 30 tablet 0     Patient Stressors: Medication change or noncompliance Substance abuse  Patient Strengths: Ability for insight General fund of knowledge Motivation for treatment/growth  Treatment Modalities: Medication Management, Group therapy, Case management,  1 to 1 session with clinician, Psychoeducation, Recreational therapy.   Physician Treatment Plan for Primary Diagnosis: <principal problem not specified> Long Term Goal(s):     Short Term Goals:    Medication Management: Evaluate patient's response, side effects, and tolerance of medication  regimen.  Therapeutic Interventions: 1 to 1 sessions, Unit Group sessions and Medication administration.  Evaluation of Outcomes: Progressing  Physician Treatment Plan for Secondary Diagnosis: Active Problems:   Bipolar 1 disorder, mixed, severe ([place])  Long Term Goal(s):     Short Term Goals:       Medication Management: Evaluate patient's response, side effects, and tolerance of medication regimen.  Therapeutic Interventions: 1 to 1 sessions, Unit Group sessions and Medication administration.  Evaluation of Outcomes: Progressing   RN Treatment Plan for Primary Diagnosis: <principal problem not specified> Long Term Goal(s): Knowledge of disease  and therapeutic regimen to maintain health will improve  Short Term Goals: Ability to remain free from injury will improve, Ability to verbalize frustration and anger appropriately will improve, Ability to demonstrate self-control, Ability to participate in decision making will improve, Ability to verbalize feelings will improve, Ability to disclose and discuss suicidal ideas, Ability to identify and develop effective coping behaviors will improve and Compliance with prescribed medications will improve  Medication Management: RN will administer medications as ordered by provider, will assess and evaluate patient's response and provide education to patient for prescribed medication. RN will report any adverse and/or side effects to prescribing provider.  Therapeutic Interventions: 1 on 1 counseling sessions, Psychoeducation, Medication administration, Evaluate responses to treatment, Monitor vital signs and CBGs as ordered, Perform/monitor CIWA, COWS, AIMS and Fall Risk screenings as ordered, Perform wound care treatments as ordered.  Evaluation of Outcomes: Progressing   LCSW Treatment Plan for Primary Diagnosis: <principal problem not specified> Long Term Goal(s): Safe transition to appropriate next level of care at discharge, Engage patient in therapeutic group addressing interpersonal concerns.  Short Term Goals: Engage patient in aftercare planning with referrals and resources, Increase social support, Increase ability to appropriately verbalize feelings, Increase emotional regulation, Facilitate acceptance of mental health diagnosis and concerns, Facilitate patient progression through stages of change regarding substance use diagnoses and concerns, Identify triggers associated with mental health/substance abuse issues and Increase skills for wellness and recovery  Therapeutic Interventions: Assess for all discharge needs, 1 to 1 time with Social worker, Explore available resources and support  systems, Assess for adequacy in community support network, Educate family and significant other(s) on suicide prevention, Complete Psychosocial Assessment, Interpersonal group therapy.  Evaluation of Outcomes: Progressing   Progress in Treatment: Attending groups: Yes. Participating in groups: Yes. Taking medication as prescribed: Yes. Toleration medication: Yes. Family/Significant other contact made: No, will contact:  Patient will provide consent. Patient understands diagnosis: Yes. Discussing patient identified problems/goals with staff: Yes. Medical problems stabilized or resolved: Yes. Denies suicidal/homicidal ideation: Patient able to contract for safety on unit. Issues/concerns per patient self-inventory: No. Other: NA  New problem(s) identified: No, Describe:  None  New Short Term/Long Term Goal(s):  Engage patient in therapeutic treatment addressing interpersonal concerns, safe transition to appropriate level of care at discharge.  Patient Goals:  "get better and get back on my meds"  Discharge Plan or Barriers: Patient to return home and participate in outpatient services.  Reason for Continuation of Hospitalization: Depression Suicidal ideation  Estimated Length of Stay:  3-5 days  Attendees: Patient:  Julie Lambert 03/19/2019 9:03 AM  Physician: Johnn Hai, MD 03/19/2019 9:03 AM  Nursing: Polly Cobia, RN 03/19/2019 9:03 AM  RN Care Manager: 03/19/2019 9:03 AM  Social Worker: Netta Neat, LCSW 03/19/2019 9:03 AM  Recreational Therapist:  03/19/2019 9:03 AM  Other:  03/19/2019 9:03 AM  Other:  03/19/2019 9:03 AM  Other: 03/19/2019  9:03 AM    Scribe for Treatment Team: Netta Neat, MSW, LCSW Clinical Social Work 03/19/2019 9:03 AM

## 2019-03-19 NOTE — H&P (Signed)
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  The patient is currently residing in a [facility] for women, she states for addictions in the past of cocaine and alcohol caused her her 5 bedroom home.  She also reports the stressors of a recent death in the family, and her son had stolen from his grandmother recently.  She describes her mood is up and down and is frustrated at her recent relapse.  Despite all of the stressors she asked if she could actually go home today however we felt it was best she stay and get back on her medications, make sure that there is no passive suicidal thinking prior to release.  The patient was last admitted in July 2020 with a similar presentation, relapse on cocaine, severity of depressive symptoms.  She has been diagnosed with a bipolar disorder secondary to substance abuse in the past.  She presented in August 2020 to the emergency department with what appeared to be cocaine-induced akathisia, tearfulness anxiety and passive suicidal thoughts but was released without admission.  At the present time she is alert oriented and cooperative without thoughts of harming herself directly but passive thoughts of wondering why she should go on no auditory or visual  hallucinations no akathisia  Associated Signs/Symptoms: Depression Symptoms:  anhedonia, insomnia, (Hypo) Manic Symptoms:  Denies Anxiety Symptoms:  Excessive Worry, Psychotic Symptoms:  Denies PTSD Symptoms: NA Total Time spent with patient: 45 minutes  Past Psychiatric History: As above here in 2020  Is the patient at risk to self? Yes.    Has the patient been a risk to self in the past 6 months? Yes.    Has the patient been a risk to self within the distant past? No.  Is the patient a risk to others? No.  Has the patient been a risk to others in the past 6 months? No.  Has the patient been a risk to others within the distant past? No.   Prior Inpatient Therapy:   Prior Outpatient Therapy:    Alcohol Screening: 1. How often do you have a drink containing alcohol?: Never 2. How many drinks containing alcohol do you have on a typical day when you are drinking?: 1 or 2 3. How often do you have six or more drinks on one occasion?: Never AUDIT-C Score: 0 4. How often during the last year have you found that you were not able to stop drinking once you had started?: Never 5. How often during the last year have you failed to do what was normally expected from you becasue of drinking?: Never 6. How often during the last year have you needed a first drink in the morning to get yourself going after a heavy drinking session?: Never 7. How often during the last year have you had a feeling of guilt of remorse after drinking?: Never 8. How often during the last year have you been  unable to remember what happened the night before because you had been drinking?: Never 9. Have you or someone else been injured as a result of your drinking?: No 10. Has a relative or friend or a doctor or another health worker been concerned about your drinking or suggested you cut down?: No Alcohol Use Disorder Identification Test Final Score (AUDIT): 0 Alcohol Brief Interventions/Follow-up: AUDIT Score <7  follow-up not indicated Substance Abuse History in the last 12 months:  Yes.   Consequences of Substance Abuse: Family Consequences:  And financial consequences Previous Psychotropic Medications: Yes  Psychological Evaluations: No  Past Medical History:  Past Medical History:  Diagnosis Date  . Bipolar 2 disorder ([place])   . Diabetes mellitus without complication ([place])   . Diverticulitis   . Diverticulosis   . GERD (gastroesophageal reflux disease)   . Sciatica   . Substance abuse ([place])   . Uterine fibroid     Past Surgical History:  Procedure Laterality Date  . BACK SURGERY    . CHOLECYSTECTOMY    . LUMBAR SPINE SURGERY     Family History:  Family History  Problem Relation Age of Onset  . CAD Mother   . Diabetes Mother   . Heart disease Mother   . Parkinson's disease Mother   . Kidney disease Mother   . Cancer Father        prostate cancer  . Breast cancer Maternal Aunt   . Breast cancer Maternal Grandmother    Family Psychiatric  History: Denies Tobacco Screening: Have you used any form of tobacco in the last 30 days? (Cigarettes, Smokeless Tobacco, Cigars, and/or Pipes): No Social History:  Social History   Substance and Sexual Activity  Alcohol Use Not Currently   Comment: last use 7/22 3 40oz      Social History   Substance and Sexual Activity  Drug Use Not Currently  . Types: "Crack" cocaine   Comment: last use 7/22, smoked crack    Additional Social History: Marital status: Single Are you sexually active?: No What is your sexual orientation?: Heterosexual Has your sexual activity been affected by drugs, alcohol, medication, or emotional stress?: Yes, it's been affected by the pandemic. Does patient have children?: Yes How many children?: 1 How is patient's relationship with their children?: Patient states she has a 9 yo son. She states they butt heads. She states she gave birth to him when she was young and she tries to be the best parent she can be.  She states her mother and grandmother helped her to raise her son.                         Allergies:   Allergies  Allergen Reactions  . Ivp Dye [Iodinated Diagnostic Agents] Itching    Patient stated she had itching on her back after IV injection   Lab Results:  Results for orders placed or performed during the hospital encounter of 03/18/19 (from the past 48 hour(s))  Glucose, capillary     Status: Abnormal   Collection Time: 03/18/19  8:23 PM  Result Value Ref Range   Glucose-Capillary 189 (H) 70 - 99 mg/dL  Glucose, capillary     Status: Abnormal   Collection Time: 03/19/19  7:01 AM  Result Value Ref Range   Glucose-Capillary 276 (H) 70 - 99 mg/dL  Hepatic function panel     Status: None   Collection Time: 03/19/19  7:37 AM  Result Value Ref  Range   Total Protein 7.4 6.5 - 8.1 g/dL   Albumin 3.7 3.5 - 5.0 g/dL   AST 19 15 - 41 U/L   ALT 16 0 - 44 U/L   Alkaline Phosphatase 117 38 - 126 U/L   Total Bilirubin 0.6 0.3 - 1.2 mg/dL   Bilirubin, Direct <0.1 0.0 - 0.2 mg/dL   Indirect Bilirubin NOT CALCULATED 0.3 - 0.9 mg/dL    Comment: Performed at [facility], [address] [place], [place] [address]  Lipid panel     Status: Abnormal   Collection Time: 03/19/19  7:37 AM  Result Value Ref Range   Cholesterol 253 (H) 0 - 200 mg/dL   Triglycerides 256 (H) <150 mg/dL   HDL 72 >40 mg/dL   Total CHOL/HDL Ratio 3.5 RATIO   VLDL 51 (H) 0 - 40 mg/dL   LDL Cholesterol 130 (H) 0 - 99 mg/dL    Comment:        Total Cholesterol/HDL:CHD Risk Coronary Heart Disease Risk Table                     Men   Women  1/2 Average Risk   3.4   3.3  Average Risk       5.0   4.4  2 X Average Risk   9.6   7.1  3 X Average Risk  23.4   11.0        Use the calculated Patient Ratio above and the CHD Risk Table to determine the patient's CHD Risk.        ATP III CLASSIFICATION (LDL):  <100     mg/dL   Optimal  100-129  mg/dL   Near or Above                    Optimal   130-159  mg/dL   Borderline  160-189  mg/dL   High  >190     mg/dL   Very High Performed at [facility], [address] [place], [place] [address]   TSH     Status: None   Collection Time: 03/19/19  7:37 AM  Result Value Ref Range   TSH 1.283 0.350 - 4.500 uIU/mL    Comment: Performed by a 3rd Generation assay with a functional sensitivity of <=0.01 uIU/mL. Performed at [facility], [address] [place], [place] [address]   Glucose, capillary     Status: Abnormal   Collection Time: 03/19/19 11:10 AM  Result Value Ref Range   Glucose-Capillary 266 (H) 70 - 99 mg/dL    Blood Alcohol level:  Lab Results  Component Value Date   ETH <10 03/17/2019   ETH 84 (H) [id number]    Metabolic Disorder Labs:  Lab Results  Component Value Date   HGBA1C 10.0 (H) 03/17/2019   MPG 240.3 03/17/2019   MPG 188.64 03/19/2018   No results found for: PROLACTIN Lab Results  Component Value Date   CHOL 253 (H) 03/19/2019   TRIG 256 (H) 03/19/2019   HDL 72 03/19/2019   CHOLHDL 3.5 03/19/2019   VLDL 51 (H) 03/19/2019   LDLCALC 130 (H) 03/19/2019   LDLCALC 39 03/19/2018    Current Medications: Current Facility-Administered Medications  Medication Dose Route Frequency Provider Last Rate Last Admin  . acetaminophen (TYLENOL) tablet 650 mg  650 mg Oral Q6H PRN Cristofano, Dorene Ar, MD      . alum & mag hydroxide-simeth (MAALOX/MYLANTA) 200-200-20 MG/5ML suspension  30 mL  30 mL Oral Q4H PRN Cristofano, Paul A, MD      . atorvastatin (LIPITOR) tablet 10 mg  10 mg Oral q1800 Cristofano, Paul A, MD      . DULoxetine (CYMBALTA) DR capsule 60 mg  60 mg Oral BID Johnn Hai, MD   60 mg at 03/19/19 1021  . gabapentin (NEURONTIN) capsule 400 mg  400 mg Oral TID Dixie Dials, MD   400 mg at 03/19/19 1132  . hydrOXYzine (ATARAX/VISTARIL) tablet 25 mg  25 mg Oral TID Johnn Hai, MD   25 mg at 03/19/19 1132  . ibuprofen (ADVIL) tablet 600 mg  600 mg Oral Q6H PRN Cristofano,  Dorene Ar, MD      . insulin glargine (LANTUS) injection 20 Units  20 Units Subcutaneous QHS Johnn Hai, MD      . magnesium hydroxide (MILK OF MAGNESIA) suspension 30 mL  30 mL Oral Daily PRN Cristofano, Paul A, MD      . metFORMIN (GLUCOPHAGE) tablet 1,000 mg  1,000 mg Oral BID WC Cristofano, Dorene Ar, MD   1,000 mg at 03/19/19 0751  . promethazine (PHENERGAN) tablet 25 mg  25 mg Oral Q6H PRN Deloria Lair, NP   25 mg at 03/18/19 2319  . traZODone (DESYREL) tablet 150 mg  150 mg Oral QHS Cristofano, Dorene Ar, MD   150 mg at 03/18/19 2125   PTA Medications: Medications Prior to Admission  Medication Sig Dispense Refill Last Dose  . atorvastatin (LIPITOR) 10 MG tablet Take 1 tablet (10 mg total) by mouth daily at 6 PM. 30 tablet 1   . cyclobenzaprine (FLEXERIL) 5 MG tablet Take 1 tablet (5 mg total) by mouth 3 (three) times daily as needed for muscle spasms. 60 tablet 1   . DULoxetine (CYMBALTA) 60 MG capsule Take 1 capsule by mouth once daily 30 capsule 0   . gabapentin (NEURONTIN) 400 MG capsule Take 1 capsule (400 mg total) by mouth 3 (three) times daily. 30 capsule 0   . HYDROcodone-acetaminophen (NORCO) 5-325 MG tablet Take 1 tablet by mouth every 4 (four) hours as needed for moderate pain. 6 tablet 0   . ibuprofen (ADVIL) 600 MG tablet Take 1 tablet (600 mg total) by mouth every 6 (six) hours as needed. 60 tablet 0   . insulin glargine (LANTUS) 100 UNIT/ML injection Inject 0.2 mLs (20 Units total) into the skin at bedtime. 10 mL 1   . metFORMIN (GLUCOPHAGE) 1000 MG tablet Take 1 tablet (1,000 mg total) by mouth 2 (two) times daily with a meal. 60 tablet 1   . pantoprazole (PROTONIX) 40 MG tablet Take 1 tablet (40 mg total) by mouth daily. 30 tablet 1   . traZODone (DESYREL) 150 MG tablet TAKE 1 TABLET BY MOUTH AT BEDTIME (Patient taking differently: Take 150 mg by mouth at bedtime as needed for sleep. ) 30 tablet 0     Musculoskeletal: Strength & Muscle Tone: within normal limits Gait &  Station: normal Patient leans: N/A  Psychiatric Specialty Exam: Physical Exam  Nursing note and vitals reviewed. Constitutional: She appears well-developed and well-nourished.    Review of Systems  Constitutional: Negative.   Eyes: Negative.   Respiratory: Negative.   Endocrine: Negative.   Genitourinary: Negative.  Genital sores:    Neurological: Negative.    diabetes  Blood pressure (!) 146/99, pulse 95, temperature 98.8 F (37.1 C), temperature source Oral, resp. rate 17, height 5\' 7"  (1.702 m), weight 81.6 kg,  last menstrual period 07/22/2016, SpO2 100 %.Body mass index is 28.18 kg/m.  General Appearance: Casual  Eye Contact:  Good  Speech:  Clear and Coherent  Volume:  Normal  Mood:  Anxious and Depressed  Affect:  Appropriate and Congruent  Thought Process:  Coherent and Goal Directed  Orientation:  Full (Time, Place, and Person)  Thought Content:  No psychosis no mania no racing thoughts  Suicidal Thoughts:  Yes.  without intent/plan  Homicidal Thoughts:  No  Memory:  Immediate;   Fair Recent;   Fair Remote;   Fair  Judgement:  Good  Insight:  Good  Psychomotor Activity:  Normal  Concentration:  Concentration: Good and Attention Span: Good  Recall:  Good  Fund of Knowledge:  Good  Language:  Good  Akathisia:  Negative  Handed:  Right  AIMS (if indicated):     Assets:  Leisure Time Physical Health Resilience Social Support  ADL's:  Intact  Cognition:  WNL  Sleep:  Number of Hours: 7.5    Treatment Plan Summary: Daily contact with patient to assess and evaluate symptoms and progress in treatment and Plan Admit for stabilization resume and escalate Cymbalta  Observation Level/Precautions:  15 minute checks  Laboratory:  UDS  Psychotherapy: Cognitive/rehab/supportive  Medications: Resume antidepressants  Consultations:    Discharge Concerns: Long-term sobriety and compliance  Estimated LOS: 7 days  Other:     Physician Treatment Plan for Primary  Diagnosis:   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 History of alcohol dependence and cocaine abuse Relapse on cocaine History of substance-induced mood disorder bipolar type Axis II deferred Axis III medically stable but does have diabetes Resume and escalate Cymbalta Add low-dose mood stabilizer  Long Term Goal(s): Improvement in symptoms so as ready for discharge  Short Term Goals: Ability to verbalize feelings will improve, Ability to demonstrate self-control will improve, Ability to maintain clinical measurements within normal limits will improve and Ability to identify triggers associated with substance abuse/mental health issues will improve  Physician Treatment Plan for Secondary Diagnosis: Active Problems:   Bipolar 1 disorder, mixed, severe ([place])  Long Term Goal(s): Improvement in symptoms so as ready for discharge  Short Term Goals: Ability to demonstrate self-control will improve, Ability to identify and develop effective coping behaviors will improve and Ability to maintain clinical measurements within normal limits will improve  I certify that inpatient services furnished can reasonably be expected to improve the patient's condition.    Johnn Hai, MD 2/6/20211:37 PM

## 2019-03-19 NOTE — [facility] Group Notes (Signed)
LCSW Group Therapy Notes   Date and Time: 03/20/2019 1:00pm  Type of Therapy and Topic: Group Therapy: Worry and Anxiety  Participation Level: [facility] PARTICIPATION LEVEL: Did Not Attend  Description of Group: In this group, patients will be encouraged to explore their worry around what could happen vs what will happen. Each patient will be challenged to think of personal worries and how they will work their way through that worry around what will happen and what could happen. This group will be process-oriented, with patients participating in exploration of their own experiences as well as giving and receiving support and challenge from other group members.  Therapeutic Goals: 1. Patient will identify personal worries that cause anxiety. 2. Patient will identify clues to identify their worry. 3. Patient will identify ways to handle their worry. 4. Patient will discuss ways that their worry has deceased or why it has not decreased.   Summary of Patient Progress:  X  Therapeutic Modalities:  Cognitive Behavioral Therapy Solution Focused Therapy Motivational Interviewing    Juanetta Beets, MSW, [place] Social Worker

## 2019-03-19 NOTE — Discharge Instructions (Signed)

## 2019-03-19 NOTE — Tx Team (Signed)
Initial Treatment Plan 03/19/2019 5:49 AM [name] [id number]    PATIENT STRESSORS: Medication change or noncompliance Substance abuse   PATIENT STRENGTHS: Ability for insight General fund of knowledge Motivation for treatment/growth   PATIENT IDENTIFIED PROBLEMS.  Suicidal ideation     Depression                  DISCHARGE CRITERIA:  Adequate post-discharge living arrangements Improved stabilization in mood, thinking, and/or behavior  PRELIMINARY DISCHARGE PLAN: Outpatient therapy  PATIENT/FAMILY INVOLVEMENT: This treatment plan has been presented to and reviewed with the patient, Julie Lambert, The patient and family have been given the opportunity to ask questions and make suggestions.  Harl Bowie, RN 03/19/2019, 5:49 AM

## 2019-03-19 NOTE — Progress Notes (Signed)
Admission Note:  54 yr Female who presents IVC in some acute distress for the treatment of SI and Depression., patient appears flat, sad, depressed, restless and tired during admission process. Patient complained of nausea and was noted vomiting during, she was assisted to her room so she can lay in the bed.Marland Kitchen HCP on call notified and order for Zofran was ordered.    Patient denies SI/HI/AVH  and contracts for safety upon admission, she stated " I relapsed on cocaine" she endosed sadness, hopelessness , guilt and shame. During the interview patient was noted tearful at times.writer call HCP once again because the nausea persisted and she ordered Phenergan for nausea which was effective.    Patient has Past medical Hx of Depression, Bipolar Disorder,    and DM without complication .  CBG was 189 upon admission. . Skin was assessed and found to be clear of any abnormal marks she was searched and no contraband found, POC and unit policies explained and understanding verbalized. Consents obtained. Food and fluids offered, and both accepted. Pt had no additional questions or concerns, 15 minute safety checks maintained.

## 2019-03-19 NOTE — Plan of Care (Signed)
Patient knowledgeable of information received, able to understand information received  . Emotional and mental  status improved . Voice of no  safety concerns . Encourage group process , to verbalize feelings . Verbalize understanding of medication received.Denies suicidal ideations   Problem: Safety: Goal: Ability to disclose and discuss suicidal ideas will improve Outcome: Progressing   Problem: Safety: Goal: Periods of time without injury will increase Outcome: Progressing   Problem: Education: Goal: Knowledge of [place] [facility] Education information/materials will improve Outcome: Progressing Goal: Emotional status will improve Outcome: Progressing Goal: Mental status will improve Outcome: Progressing Goal: Verbalization of understanding the information provided will improve Outcome: Progressing

## 2019-03-19 NOTE — Progress Notes (Signed)
Inpatient Diabetes Program Recommendations  AACE/ADA: New Consensus Statement on Inpatient Glycemic Control (2015)  Target Ranges:  Prepandial:   less than 140 mg/dL      Peak postprandial:   less than 180 mg/dL (1-2 hours)      Critically ill patients:  140 - 180 mg/dL   Lab Results  Component Value Date   GLUCAP 276 (H) 03/19/2019   HGBA1C 10.0 (H) 03/17/2019    Review of Glycemic Control  Diabetes history: DM 2 Outpatient Diabetes medications: Lantus 20 units qhs, Metformin 1000 mg bid Current orders for Inpatient glycemic control:  Lantus 20 units qhs, Metformin 1000 mg bid  Inpatient Diabetes Program Recommendations:    Agree with Lantus increase  Consider Novolog 0-15 units tid + hs  Thanks,  Tama Headings RN, MSN, BC-ADM Inpatient Diabetes Coordinator Team Pager [phone] (8a-5p)

## 2019-03-20 DIAGNOSIS — F332 Major depressive disorder, recurrent severe without psychotic features: Secondary | ICD-10-CM

## 2019-03-20 LAB — GLUCOSE, CAPILLARY
Glucose-Capillary: 341 mg/dL — ABNORMAL HIGH (ref 70–99)
Glucose-Capillary: 286 mg/dL — ABNORMAL HIGH (ref 70–99)
Glucose-Capillary: 179 mg/dL — ABNORMAL HIGH (ref 70–99)
Glucose-Capillary: 295 mg/dL — ABNORMAL HIGH (ref 70–99)

## 2019-03-20 MED ORDER — AMANTADINE HCL 100 MG PO CAPS
100.0000 mg | ORAL_CAPSULE | Freq: Two times a day (BID) | ORAL | Status: DC
  Administered 2019-03-20 – 2019-03-21 (×3): 100 mg via ORAL
  Filled 2019-03-20 (×3): qty 1

## 2019-03-20 MED ORDER — NICOTINE 21 MG/24HR TD PT24
21.0000 mg | MEDICATED_PATCH | Freq: Every day | TRANSDERMAL | Status: DC
  Administered 2019-03-20 – 2019-03-21 (×2): 21 mg via TRANSDERMAL
  Filled 2019-03-20 (×2): qty 1

## 2019-03-20 NOTE — Progress Notes (Signed)
[facility] MD Progress Note  03/20/2019 9:00 AM Julie Lambert  MRN:  [id number] Subjective:   This is the second psychiatric admission at this facility for Julie Lambert, she is 54 years of age and presented voluntarily on 2/4, reporting a cluster of symptoms-she was anxious and tearful, reported memory deficits, reported to relapse on crack cocaine last using Monday prior to admission, reporting passive suicidal thoughts and mood instability.  The patient reports she slept without dream of drug abuse but disturbing dreams all the same.  She also had mild cocaine cravings throughout the day and worse in the evening.  She denies wanting to harm self today she is actually eager for discharge and hopes to go tomorrow.  Believes thus far medication adjustments have been successful.  Requests nicotine patch  Principal Problem: <principal problem not specified> Diagnosis: Active Problems:   Bipolar 1 disorder, mixed, severe (HCC)  Total Time spent with patient: 20 minutes  Past Psychiatric History: 1 prior admission under similar circumstances  Past Medical History:  Past Medical History:  Diagnosis Date  . Bipolar 2 disorder ([place])   . Diabetes mellitus without complication ([place])   . Diverticulitis   . Diverticulosis   . GERD (gastroesophageal reflux disease)   . Sciatica   . Substance abuse ([place])   . Uterine fibroid     Past Surgical History:  Procedure Laterality Date  . BACK SURGERY    . CHOLECYSTECTOMY    . LUMBAR SPINE SURGERY     Family History:  Family History  Problem Relation Age of Onset  . CAD Mother   . Diabetes Mother   . Heart disease Mother   . Parkinson's disease Mother   . Kidney disease Mother   . Cancer Father        prostate cancer  . Breast cancer Maternal Aunt   . Breast cancer Maternal Grandmother    Family Psychiatric  History: see Eval Social History:  Social History   Substance and Sexual Activity  Alcohol Use Not Currently   Comment: last use 7/22 3  40oz      Social History   Substance and Sexual Activity  Drug Use Not Currently  . Types: "Crack" cocaine   Comment: last use 7/22, smoked crack    Social History   Socioeconomic History  . Marital status: Divorced    Spouse name: Not on file  . Number of children: Not on file  . Years of education: Not on file  . Highest education level: Not on file  Occupational History  . Not on file  Tobacco Use  . Smoking status: Current Every Day Smoker    Packs/day: 0.25    Years: 40.00    Pack years: 10.00    Types: Cigarettes  . Smokeless tobacco: Never Used  Substance and Sexual Activity  . Alcohol use: Not Currently    Comment: last use 7/22 3 40oz   . Drug use: Not Currently    Types: "Crack" cocaine    Comment: last use 7/22, smoked crack  . Sexual activity: Yes    Birth control/protection: Condom, Post-menopausal  Other Topics Concern  . Not on file  Social History Narrative  . Not on file   Social Determinants of Health   Financial Resource Strain:   . Difficulty of Paying Living Expenses: Not on file  Food Insecurity:   . Worried About Charity fundraiser in the Last Year: Not on file  . Ran Out of Food in the  Last Year: Not on file  Transportation Needs:   . Lack of Transportation (Medical): Not on file  . Lack of Transportation (Non-Medical): Not on file  Physical Activity:   . Days of Exercise per Week: Not on file  . Minutes of Exercise per Session: Not on file  Stress:   . Feeling of Stress : Not on file  Social Connections:   . Frequency of Communication with Friends and Family: Not on file  . Frequency of Social Gatherings with Friends and Family: Not on file  . Attends Religious Services: Not on file  . Active Member of Clubs or Organizations: Not on file  . Attends Archivist Meetings: Not on file  . Marital Status: Not on file   Sleep: Good  Appetite:  Good  Current Medications: Current Facility-Administered Medications   Medication Dose Route Frequency Provider Last Rate Last Admin  . acetaminophen (TYLENOL) tablet 650 mg  650 mg Oral Q6H PRN Cristofano, Dorene Ar, MD      . alum & mag hydroxide-simeth (MAALOX/MYLANTA) 200-200-20 MG/5ML suspension 30 mL  30 mL Oral Q4H PRN Cristofano, Dorene Ar, MD      . amantadine (SYMMETREL) capsule 100 mg  100 mg Oral BID Johnn Hai, MD      . atorvastatin (LIPITOR) tablet 10 mg  10 mg Oral q1800 Cristofano, Dorene Ar, MD   10 mg at 03/19/19 1636  . DULoxetine (CYMBALTA) DR capsule 60 mg  60 mg Oral BID Johnn Hai, MD   60 mg at 03/20/19 0843  . gabapentin (NEURONTIN) capsule 400 mg  400 mg Oral TID Dixie Dials, MD   400 mg at 03/20/19 0843  . hydrOXYzine (ATARAX/VISTARIL) tablet 25 mg  25 mg Oral TID Johnn Hai, MD   25 mg at 03/20/19 0843  . ibuprofen (ADVIL) tablet 600 mg  600 mg Oral Q6H PRN Cristofano, Paul A, MD      . insulin glargine (LANTUS) injection 20 Units  20 Units Subcutaneous QHS Johnn Hai, MD   20 Units at 03/19/19 2121  . magnesium hydroxide (MILK OF MAGNESIA) suspension 30 mL  30 mL Oral Daily PRN Cristofano, Paul A, MD      . metFORMIN (GLUCOPHAGE) tablet 1,000 mg  1,000 mg Oral BID WC Cristofano, Dorene Ar, MD   1,000 mg at 03/20/19 0843  . nicotine (NICODERM CQ - dosed in mg/24 hours) patch 21 mg  21 mg Transdermal Daily Johnn Hai, MD      . promethazine (PHENERGAN) tablet 25 mg  25 mg Oral Q6H PRN Deloria Lair, NP   25 mg at 03/18/19 2319  . traZODone (DESYREL) tablet 150 mg  150 mg Oral QHS Cristofano, Paul A, MD   150 mg at 03/19/19 2122    Lab Results:  Results for orders placed or performed during the hospital encounter of 03/18/19 (from the past 48 hour(s))  Glucose, capillary     Status: Abnormal   Collection Time: 03/18/19  8:23 PM  Result Value Ref Range   Glucose-Capillary 189 (H) 70 - 99 mg/dL  Glucose, capillary     Status: Abnormal   Collection Time: 03/19/19  7:01 AM  Result Value Ref Range   Glucose-Capillary 276 (H) 70  - 99 mg/dL  Hepatic function panel     Status: None   Collection Time: 03/19/19  7:37 AM  Result Value Ref Range   Total Protein 7.4 6.5 - 8.1 g/dL   Albumin 3.7 3.5 - 5.0 g/dL  AST 19 15 - 41 U/L   ALT 16 0 - 44 U/L   Alkaline Phosphatase 117 38 - 126 U/L   Total Bilirubin 0.6 0.3 - 1.2 mg/dL   Bilirubin, Direct <0.1 0.0 - 0.2 mg/dL   Indirect Bilirubin NOT CALCULATED 0.3 - 0.9 mg/dL    Comment: Performed at [facility], [address] [place], [place] [address]  Lipid panel     Status: Abnormal   Collection Time: 03/19/19  7:37 AM  Result Value Ref Range   Cholesterol 253 (H) 0 - 200 mg/dL   Triglycerides 256 (H) <150 mg/dL   HDL 72 >40 mg/dL   Total CHOL/HDL Ratio 3.5 RATIO   VLDL 51 (H) 0 - 40 mg/dL   LDL Cholesterol 130 (H) 0 - 99 mg/dL    Comment:        Total Cholesterol/HDL:CHD Risk Coronary Heart Disease Risk Table                     Men   Women  1/2 Average Risk   3.4   3.3  Average Risk       5.0   4.4  2 X Average Risk   9.6   7.1  3 X Average Risk  23.4   11.0        Use the calculated Patient Ratio above and the CHD Risk Table to determine the patient's CHD Risk.        ATP III CLASSIFICATION (LDL):  <100     mg/dL   Optimal  100-129  mg/dL   Near or Above                    Optimal  130-159  mg/dL   Borderline  160-189  mg/dL   High  >190     mg/dL   Very High Performed at [facility], [address] [place], [place] [address]   TSH     Status: None   Collection Time: 03/19/19  7:37 AM  Result Value Ref Range   TSH 1.283 0.350 - 4.500 uIU/mL    Comment: Performed by a 3rd Generation assay with a functional sensitivity of <=0.01 uIU/mL. Performed at [facility], [address] [place], [place] [address]   Glucose, capillary     Status: Abnormal   Collection Time: 03/19/19 11:10 AM  Result Value Ref Range   Glucose-Capillary 266 (H) 70 - 99 mg/dL  Glucose, capillary     Status: Abnormal   Collection Time:  03/19/19  4:20 PM  Result Value Ref Range   Glucose-Capillary 254 (H) 70 - 99 mg/dL  Glucose, capillary     Status: Abnormal   Collection Time: 03/19/19  9:11 PM  Result Value Ref Range   Glucose-Capillary 248 (H) 70 - 99 mg/dL   Comment 1 Notify RN   Glucose, capillary     Status: Abnormal   Collection Time: 03/20/19  7:02 AM  Result Value Ref Range   Glucose-Capillary 341 (H) 70 - 99 mg/dL   Comment 1 Notify RN     Blood Alcohol level:  Lab Results  Component Value Date   ETH <10 03/17/2019   ETH 84 (H) [id number]    Metabolic Disorder Labs: Lab Results  Component Value Date   HGBA1C 10.0 (H) 03/17/2019   MPG 240.3 03/17/2019   MPG 188.64 03/19/2018   No results found for: PROLACTIN Lab Results  Component Value Date  CHOL 253 (H) 03/19/2019   TRIG 256 (H) 03/19/2019   HDL 72 03/19/2019   CHOLHDL 3.5 03/19/2019   VLDL 51 (H) 03/19/2019   LDLCALC 130 (H) 03/19/2019   LDLCALC 39 03/19/2018    Musculoskeletal: Strength & Muscle Tone: within normal limits Gait & Station: normal Patient leans: N/A  Psychiatric Specialty Exam: Physical Exam  Review of Systems  Blood pressure 104/75, pulse (!) 103, temperature 98.3 F (36.8 C), temperature source Oral, resp. rate 18, height 5\' 7"  (1.702 m), weight 81.6 kg, last menstrual period 07/22/2016, SpO2 100 %.Body mass index is 28.18 kg/m.  General Appearance: Casual and Fairly Groomed  Eye Contact:  Good  Speech:  Clear and Coherent  Volume:  Normal  Mood:  Dysphoric  Affect:  Appropriate and Congruent  Thought Process:  Goal Directed  Orientation:  Full (Time, Place, and Person)  Thought Content:  Rumination  Suicidal Thoughts:  No  Homicidal Thoughts:  No  Memory:  Immediate;   Good Recent;   Good Remote;   Good  Judgement:  Good  Insight:  Good  Psychomotor Activity:  Normal  Concentration:  Concentration: Good and Attention Span: Good  Recall:  Good  Fund of Knowledge:  Good  Language:  Good  Akathisia:   Negative  Handed:  Right  AIMS (if indicated):     Assets:  Resilience Social Support  ADL's:  Intact  Cognition:  WNL  Sleep:  Number of Hours: 7.5     Treatment Plan Summary: Daily contact with patient to assess and evaluate symptoms and progress in treatment and Medication management  Continue 15-minute checks/precautions Add amantadine for cocaine cravings Continue Cymbalta for severe depression Continue gabapentin for anxiety and history of alcohol dependency No change in precautions Requesting discharge tomorrow probably ready for discharge by then -currently not suicidal and contracting fully  Kahil Agner, MD 03/20/2019, 9:00 AM

## 2019-03-20 NOTE — Plan of Care (Signed)
D- Patient alert and oriented. Patient presents in a pleasant mood on assessment stating that she slept ok last night and had complaints of "a buzzing in my head that comes and goes, like a hearing test". Patient rated her pain an "8/10" and "10/10", reporting that her arms and legs are aching. Patient endorsed depression and anxiety, rating them a "6/10" and "10/10". Patient reports "family issues" and "I really want a cigarette", is why she's feeling this way. Patient denies SI, HI, AVH, at this time. Patient's goal for today is "taking meds, getting back to NA/AA".  A- Scheduled medications administered to patient, per MD orders. Support and encouragement provided.  Routine safety checks conducted every 15 minutes.  Patient informed to notify staff with problems or concerns.  R- No adverse drug reactions noted. Patient contracts for safety at this time. Patient compliant with medications and treatment plan. Patient receptive, calm, and cooperative. Patient interacts well with others on the unit.  Patient remains safe at this time.  Problem: Education: Goal: Knowledge of [place] [facility] Education information/materials will improve Outcome: Progressing Goal: Emotional status will improve Outcome: Progressing Goal: Mental status will improve Outcome: Progressing Goal: Verbalization of understanding the information provided will improve Outcome: Progressing   Problem: Safety: Goal: Periods of time without injury will increase Outcome: Progressing   Problem: Education: Goal: Utilization of techniques to improve thought processes will improve Outcome: Progressing Goal: Knowledge of the prescribed therapeutic regimen will improve Outcome: Progressing   Problem: Safety: Goal: Ability to disclose and discuss suicidal ideas will improve Outcome: Progressing

## 2019-03-20 NOTE — Plan of Care (Signed)
Patient has been in the milieu. Currently in the dayroom with peers, watching TV. Pleasant on approach and cooperative. Alert and oriented. Denies suicidal thoughts but reports feeling anxious and depressed. Currently no major concern. Staff continue to provide support and encouragements. Safety monitored as expected.

## 2019-03-20 NOTE — Progress Notes (Signed)
Inpatient Diabetes Program Recommendations  AACE/ADA: New Consensus Statement on Inpatient Glycemic Control (2015)  Target Ranges:  Prepandial:   less than 140 mg/dL      Peak postprandial:   less than 180 mg/dL (1-2 hours)      Critically ill patients:  140 - 180 mg/dL   Lab Results  Component Value Date   GLUCAP 341 (H) 03/20/2019   HGBA1C 10.0 (H) 03/17/2019    Review of Glycemic Control  Diabetes history: DM 2 Outpatient Diabetes medications: Lantus 20 units qhs, Metformin 1000 mg bid Current orders for Inpatient glycemic control:  Lantus 20 units qhs, Metformin 1000 mg bid  Inpatient Diabetes Program Recommendations:    Fasting glucose 340's this am.   -  Increase Lantus to 28-30 units.  -  Add Novolog 0-15 units tid + hs  Thanks,  Tama Headings RN, MSN, BC-ADM Inpatient Diabetes Coordinator Team Pager [phone] (8a-5p)

## 2019-03-21 DIAGNOSIS — F3163 Bipolar disorder, current episode mixed, severe, without psychotic features: Principal | ICD-10-CM

## 2019-03-21 LAB — GLUCOSE, CAPILLARY
Glucose-Capillary: 297 mg/dL — ABNORMAL HIGH (ref 70–99)
Glucose-Capillary: 387 mg/dL — ABNORMAL HIGH (ref 70–99)

## 2019-03-21 MED ORDER — METFORMIN HCL 1000 MG PO TABS: 1000.0000 mg | ORAL_TABLET | Freq: Two times a day (BID) | ORAL | 0 refills | Status: DC

## 2019-03-21 MED ORDER — HYDROXYZINE HCL 25 MG PO TABS: 25.0000 mg | ORAL_TABLET | Freq: Three times a day (TID) | ORAL | 1 refills | Status: DC

## 2019-03-21 MED ORDER — TRAZODONE HCL 150 MG PO TABS: 150.0000 mg | ORAL_TABLET | Freq: Every day | ORAL | 1 refills | Status: DC

## 2019-03-21 MED ORDER — ATORVASTATIN CALCIUM 10 MG PO TABS: 10.0000 mg | ORAL_TABLET | Freq: Every day | ORAL | 0 refills | Status: DC

## 2019-03-21 MED ORDER — HYDROXYZINE HCL 25 MG PO TABS: 25.0000 mg | ORAL_TABLET | Freq: Three times a day (TID) | ORAL | 0 refills | Status: DC

## 2019-03-21 MED ORDER — AMANTADINE HCL 100 MG PO CAPS: 100.0000 mg | ORAL_CAPSULE | Freq: Two times a day (BID) | ORAL | 1 refills | Status: DC

## 2019-03-21 MED ORDER — GABAPENTIN 400 MG PO CAPS: 400.0000 mg | ORAL_CAPSULE | Freq: Three times a day (TID) | ORAL | 0 refills | Status: DC

## 2019-03-21 MED ORDER — PANTOPRAZOLE SODIUM 40 MG PO TBEC: 40.0000 mg | DELAYED_RELEASE_TABLET | Freq: Every day | ORAL | 1 refills | Status: DC

## 2019-03-21 MED ORDER — TRAZODONE HCL 150 MG PO TABS: 150.0000 mg | ORAL_TABLET | Freq: Every day | ORAL | 0 refills | Status: DC

## 2019-03-21 MED ORDER — AMANTADINE HCL 100 MG PO CAPS: 100.0000 mg | ORAL_CAPSULE | Freq: Two times a day (BID) | ORAL | 0 refills | Status: DC

## 2019-03-21 MED ORDER — GABAPENTIN 400 MG PO CAPS: 400.0000 mg | ORAL_CAPSULE | Freq: Three times a day (TID) | ORAL | 1 refills | Status: DC

## 2019-03-21 MED ORDER — DULOXETINE HCL 60 MG PO CPEP: 60.0000 mg | ORAL_CAPSULE | Freq: Two times a day (BID) | ORAL | 1 refills | Status: DC

## 2019-03-21 MED ORDER — ATORVASTATIN CALCIUM 10 MG PO TABS: 10.0000 mg | ORAL_TABLET | Freq: Every day | ORAL | 1 refills | Status: DC

## 2019-03-21 MED ORDER — METFORMIN HCL 1000 MG PO TABS: 1000.0000 mg | ORAL_TABLET | Freq: Two times a day (BID) | ORAL | 1 refills | Status: DC

## 2019-03-21 MED ORDER — DULOXETINE HCL 60 MG PO CPEP: 60.0000 mg | ORAL_CAPSULE | Freq: Two times a day (BID) | ORAL | 0 refills | Status: DC

## 2019-03-21 MED ORDER — INSULIN GLARGINE 100 UNIT/ML ~~LOC~~ SOLN: 20.0000 [IU] | Freq: Every day | SUBCUTANEOUS | 0 refills | Status: DC

## 2019-03-21 MED ORDER — INSULIN GLARGINE 100 UNIT/ML ~~LOC~~ SOLN: 20.0000 [IU] | Freq: Every day | SUBCUTANEOUS | 1 refills | Status: DC

## 2019-03-21 MED ORDER — PANTOPRAZOLE SODIUM 40 MG PO TBEC: 40.0000 mg | DELAYED_RELEASE_TABLET | Freq: Every day | ORAL | 0 refills | Status: DC

## 2019-03-21 NOTE — BHH Suicide Risk Assessment (Signed)
[facility] Discharge Suicide Risk Assessment   Principal Problem: <principal problem not specified> Discharge Diagnoses: Active Problems:   Bipolar 1 disorder, mixed, severe (HCC)   Total Time spent with patient: 30 minutes  Musculoskeletal: Strength & Muscle Tone: within normal limits Gait & Station: normal Patient leans: N/A  Psychiatric Specialty Exam: Review of Systems  Constitutional: Negative.   HENT: Negative.   Eyes: Negative.   Respiratory: Negative.   Cardiovascular: Negative.   Gastrointestinal: Negative.   Musculoskeletal: Negative.   Skin: Negative.   Neurological: Negative.   Psychiatric/Behavioral: Negative.     Blood pressure 114/79, pulse 94, temperature 98 F (36.7 C), temperature source Oral, resp. rate 18, height 5\' 7"  (1.702 m), weight 81.6 kg, last menstrual period 07/22/2016, SpO2 100 %.Body mass index is 28.18 kg/m.  General Appearance: Casual  Eye Contact::  Good  Speech:  Clear and [id number]  Volume:  Normal  Mood:  Euthymic  Affect:  Constricted  Thought Process:  Goal Directed  Orientation:  NA  Thought Content:  Logical  Suicidal Thoughts:  No  Homicidal Thoughts:  No  Memory:  Immediate;   Fair Recent;   Fair Remote;   Fair  Judgement:  Fair  Insight:  Fair  Psychomotor Activity:  Normal  Concentration:  Fair  Recall:  [employer] [place]  Language: Fair  Akathisia:  No  Handed:  Right  AIMS (if indicated):     Assets:  Desire for Improvement Housing Physical Health  Sleep:  Number of Hours: 4  Cognition: WNL  ADL's:  Intact   Mental Status Per Nursing Assessment::   On Admission:  NA  Demographic Factors:  Living alone  Loss Factors: Financial problems/change in socioeconomic status  Historical Factors: Family history of suicide  Risk Reduction Factors:   Employed  Continued Clinical Symptoms:  Depression:   Comorbid alcohol abuse/dependence Alcohol/Substance Abuse/Dependencies  Cognitive Features That  Contribute To Risk:  None    Suicide Risk:  Minimal: No identifiable suicidal ideation.  Patients presenting with no risk factors but with morbid ruminations; may be classified as minimal risk based on the severity of the depressive symptoms    Plan Of Care/Follow-up recommendations:  Activity:  Activity as tolerated Diet:  Regular diet Other:  Follow-up with outpatient treatment with Cindie Laroche, MD 03/21/2019, 9:31 AM

## 2019-03-21 NOTE — Progress Notes (Signed)
Inpatient Diabetes Program Recommendations  AACE/ADA: New Consensus Statement on Inpatient Glycemic Control (2015)  Target Ranges:  Prepandial:   less than 140 mg/dL      Peak postprandial:   less than 180 mg/dL (1-2 hours)      Critically ill patients:  140 - 180 mg/dL   Results for Julie Lambert, Julie Lambert (MRN [id number]) as of 03/21/2019 08:28  Ref. Range 03/20/2019 07:02 03/20/2019 11:03 03/20/2019 16:26 03/20/2019 21:09  Glucose-Capillary Latest Ref Range: 70 - 99 mg/dL 341 (H) 286 (H) 179 (H) 295 (H)   20 units LANTUS   Results for Julie Lambert, Julie Lambert (MRN [id number]) as of 03/21/2019 08:28  Ref. Range 03/21/2019 06:55  Glucose-Capillary Latest Ref Range: 70 - 99 mg/dL 297 (H)    Admit with: Depression and SI  History: DM2  Home DM Meds: Lantus 20 units QHS                             Metformin 1000 mg BID  Current Orders: Lantus 20 units QHS      Metformin 1000 mg BID     MD- Please consider the following in-hospital insulin adjustments:  1. Increase Lantus to 30 units QHS  2. Start Novolog Moderate Correction Scale/ SSI (0-15 units) TID AC + HS     Per MD notes, patient has been: "stretching out her meds to try to make them last as long as possible"--Current A1c of 10% is reflective of this statement.  Unsure where patient is getting meds??  Metformin is inexpensive, however, Lantus is expensive without insurance.    --Will follow patient during hospitalization--  Wyn Quaker RN, MSN, CDE Diabetes Coordinator Inpatient Glycemic Control Team Team Pager: [phone] (8a-5p)

## 2019-03-21 NOTE — BHH Group Notes (Signed)
LCSW Group Therapy Note   03/21/2019 12:46 PM  Type of Therapy and Topic:  Group Therapy:  Overcoming Obstacles   Participation Level:  Did Not Attend   Description of Group:    In this group patients will be encouraged to explore what they see as obstacles to their own wellness and recovery. They will be guided to discuss their thoughts, feelings, and behaviors related to these obstacles. The group will process together ways to cope with barriers, with attention given to specific choices patients can make. Each patient will be challenged to identify changes they are motivated to make in order to overcome their obstacles. This group will be process-oriented, with patients participating in exploration of their own experiences as well as giving and receiving support and challenge from other group members.   Therapeutic Goals: 1. Patient will identify personal and current obstacles as they relate to admission. 2. Patient will identify barriers that currently interfere with their wellness or overcoming obstacles.  3. Patient will identify feelings, thought process and behaviors related to these barriers. 4. Patient will identify two changes they are willing to make to overcome these obstacles:      Summary of Patient Progress X   Therapeutic Modalities:   Cognitive Behavioral Therapy Solution Focused Therapy Motivational Interviewing Relapse Prevention Therapy  Assunta Curtis, MSW, LCSW 03/21/2019 12:46 PM

## 2019-03-21 NOTE — Progress Notes (Signed)
  [facility] Adult Case Management Discharge Plan :  Will you be returning to the same living situation after discharge:  Yes,  pt reports that she is returning home. At discharge, do you have transportation home?: Yes,  pt reports that her car is on campus. Do you have the ability to pay for your medications: No.  Release of information consent forms completed and in the chart;  Patient's signature needed at discharge.  Patient to Follow up at: Follow-up Information    [facility] Follow up.   Why: Walk in hours are 9-4 MOnday through Friday.  Please bring copy of discharge paperwork, any insurance information and ID. Contact information: [place] [address] [phone]           Next level of care provider has access to [place] and Suicide Prevention discussed: Yes,  SPE completed with pt.  Have you used any form of tobacco in the last 30 days? (Cigarettes, Smokeless Tobacco, Cigars, and/or Pipes): No  Has patient been referred to the Quitline?: N/A patient is not a smoker  Patient has been referred for addiction treatment: Pt. refused referral  Rozann Lesches, LCSW 03/21/2019, 9:41 AM

## 2019-03-21 NOTE — Discharge Summary (Signed)
Physician Discharge Summary Note  Patient:  Julie Lambert is an 54 y.o., female MRN:  [id number] DOB:  08-Jul-1965 Patient phone:  [phone] (home)  Patient address:   [address] [place] [address],  Total Time spent with patient: 30 minutes  Date of Admission:  03/18/2019 Date of Discharge: 03/21/19  Reason for Admission:  54 years of age and presented voluntarily on 2/4, reporting a cluster of symptoms-she was anxious and tearful, reported memory deficits, reported to relapse on crack cocaine last using Monday prior to admission, reporting passive suicidal thoughts and mood instability.  Principal Problem: <principal problem not specified> Discharge Diagnoses: Active Problems:   Bipolar 1 disorder, mixed, severe ([place])   Past Psychiatric History: last admitted in July 2020 with a similar presentation, relapse on cocaine, severity of depressive symptoms.  She has been diagnosed with a bipolar disorder secondary to substance abuse in the past.  She presented in August 2020 to the emergency department with what appeared to be cocaine-induced akathisia, tearfulness anxiety and passive suicidal thoughts but was released without admission.  Past Medical History:  Past Medical History:  Diagnosis Date  . Bipolar 2 disorder ([place])   . Diabetes mellitus without complication ([place])   . Diverticulitis   . Diverticulosis   . GERD (gastroesophageal reflux disease)   . Sciatica   . Substance abuse ([place])   . Uterine fibroid     Past Surgical History:  Procedure Laterality Date  . BACK SURGERY    . CHOLECYSTECTOMY    . LUMBAR SPINE SURGERY     Family History:  Family History  Problem Relation Age of Onset  . CAD Mother   . Diabetes Mother   . Heart disease Mother   . Parkinson's disease Mother   . Kidney disease Mother   . Cancer Father        prostate cancer  . Breast cancer Maternal Aunt   . Breast cancer Maternal Grandmother    Family Psychiatric  History: None  reported Social History:  Social History   Substance and Sexual Activity  Alcohol Use Not Currently   Comment: last use 7/22 3 40oz      Social History   Substance and Sexual Activity  Drug Use Not Currently  . Types: "Crack" cocaine   Comment: last use 7/22, smoked crack    Social History   Socioeconomic History  . Marital status: Divorced    Spouse name: Not on file  . Number of children: Not on file  . Years of education: Not on file  . Highest education level: Not on file  Occupational History  . Not on file  Tobacco Use  . Smoking status: Current Every Day Smoker    Packs/day: 0.25    Years: 40.00    Pack years: 10.00    Types: Cigarettes  . Smokeless tobacco: Never Used  Substance and Sexual Activity  . Alcohol use: Not Currently    Comment: last use 7/22 3 40oz   . Drug use: Not Currently    Types: "Crack" cocaine    Comment: last use 7/22, smoked crack  . Sexual activity: Yes    Birth control/protection: Condom, Post-menopausal  Other Topics Concern  . Not on file  Social History Narrative  . Not on file   Social Determinants of Health   Financial Resource Strain:   . Difficulty of Paying Living Expenses: Not on file  Food Insecurity:   . Worried About Charity fundraiser in  the Last Year: Not on file  . Ran Out of Food in the Last Year: Not on file  Transportation Needs:   . Lack of Transportation (Medical): Not on file  . Lack of Transportation (Non-Medical): Not on file  Physical Activity:   . Days of Exercise per Week: Not on file  . Minutes of Exercise per Session: Not on file  Stress:   . Feeling of Stress : Not on file  Social Connections:   . Frequency of Communication with Friends and Family: Not on file  . Frequency of Social Gatherings with Friends and Family: Not on file  . Attends Religious Services: Not on file  . Active Member of Clubs or Organizations: Not on file  . Attends Archivist Meetings: Not on file  . Marital  Status: Not on file    Hospital Course:  Patient remained on the [facility] unit for 2 days. The patient stabilized on medication and therapy. Patient was discharged on amantadine 100 mg twice daily, Lipitor 10 mg daily, Cymbalta 60 mg twice daily, Neurontin 400 mg 3 times daily, Vistaril 25 mg 3 times daily, Lantus 20 units subcu nightly, metformin 1000 mg twice daily with meals, and trazodone 150 mg nightly. Patient has shown improvement with improved mood, affect, sleep, appetite, and interaction. Patient has attended group and participated. Patient has been seen in the day room interacting with peers and staff appropriately. Patient denies any SI/HI/AVH and contracts for safety. Patient agrees to follow up at [facility]. Patient is provided with prescriptions for their medications upon discharge.  Physical Findings: AIMS:  , ,  ,  ,    CIWA:    COWS:     Musculoskeletal: Strength & Muscle Tone: within normal limits Gait & Station: normal Patient leans: N/A  Psychiatric Specialty Exam: Physical Exam  Nursing note and vitals reviewed. Constitutional: She is oriented to person, place, and time. She appears well-developed and well-nourished.  Cardiovascular: Normal rate.  Respiratory: Effort normal.  Musculoskeletal:        General: Normal range of motion.  Neurological: She is alert and oriented to person, place, and time.  Skin: Skin is warm.    Review of Systems  Constitutional: Negative.   HENT: Negative.   Eyes: Negative.   Respiratory: Negative.   Cardiovascular: Negative.   Gastrointestinal: Negative.   Genitourinary: Negative.   Musculoskeletal: Negative.   Skin: Negative.   Neurological: Negative.   Psychiatric/Behavioral: Negative.     Blood pressure 114/79, pulse 94, temperature 98 F (36.7 C), temperature source Oral, resp. rate 18, height 5\' 7"  (1.702 m), weight 81.6 kg, last menstrual period 07/22/2016, SpO2 100 %.Body mass index is 28.18 kg/m.    General Appearance: Casual  Eye Contact::  Good  Speech:  Clear and [id number]  Volume:  Normal  Mood:  Euthymic  Affect:  Constricted  Thought Process:  Goal Directed  Orientation:  NA  Thought Content:  Logical  Suicidal Thoughts:  No  Homicidal Thoughts:  No  Memory:  Immediate;   Fair Recent;   Fair Remote;   Fair  Judgement:  Fair  Insight:  Fair  Psychomotor Activity:  Normal  Concentration:  Fair  Recall:  [employer] [place]  Language: Fair  Akathisia:  No  Handed:  Right  AIMS (if indicated):     Assets:  Desire for Improvement Housing Physical Health  Sleep:  Number of Hours: 4  Cognition: WNL  ADL's:  Intact  Have you used any form of tobacco in the last 30 days? (Cigarettes, Smokeless Tobacco, Cigars, and/or Pipes): No  Has this patient used any form of tobacco in the last 30 days? (Cigarettes, Smokeless Tobacco, Cigars, and/or Pipes) Yes, No  Blood Alcohol level:  Lab Results  Component Value Date   ETH <10 03/17/2019   ETH 84 (H) [id number]    Metabolic Disorder Labs:  Lab Results  Component Value Date   HGBA1C 10.0 (H) 03/17/2019   MPG 240.3 03/17/2019   MPG 188.64 03/19/2018   No results found for: PROLACTIN Lab Results  Component Value Date   CHOL 253 (H) 03/19/2019   TRIG 256 (H) 03/19/2019   HDL 72 03/19/2019   CHOLHDL 3.5 03/19/2019   VLDL 51 (H) 03/19/2019   LDLCALC 130 (H) 03/19/2019   LDLCALC 39 03/19/2018    See Psychiatric Specialty Exam and Suicide Risk Assessment completed by Attending Physician prior to discharge.  Discharge destination:  Home  Is patient on multiple antipsychotic therapies at discharge:  No   Has Patient had three or more failed trials of antipsychotic monotherapy by history:  No  Recommended Plan for Multiple Antipsychotic Therapies: NA  Discharge Instructions    Diet - low sodium heart healthy   Complete by: As directed    Increase activity slowly   Complete by: As directed       Allergies as of 03/21/2019      Reactions   Ivp Dye [iodinated Diagnostic Agents] Itching   Patient stated she had itching on her back after IV injection      Medication List    STOP taking these medications   cyclobenzaprine 5 MG tablet Commonly known as: FLEXERIL   HYDROcodone-acetaminophen 5-325 MG tablet Commonly known as: Norco   ibuprofen 600 MG tablet Commonly known as: ADVIL     TAKE these medications     Indication  amantadine 100 MG capsule Commonly known as: SYMMETREL Take 1 capsule (100 mg total) by mouth 2 (two) times daily.  Indication: Extrapyramidal Reaction caused by Medications   atorvastatin 10 MG tablet Commonly known as: LIPITOR Take 1 tablet (10 mg total) by mouth daily at 6 PM.  Indication: High Amount of Fats in the Blood   DULoxetine 60 MG capsule Commonly known as: CYMBALTA Take 1 capsule (60 mg total) by mouth 2 (two) times daily. What changed: when to take this  Indication: Major Depressive Disorder   gabapentin 400 MG capsule Commonly known as: NEURONTIN Take 1 capsule (400 mg total) by mouth 3 (three) times daily.  Indication: Fibromyalgia Syndrome   hydrOXYzine 25 MG tablet Commonly known as: ATARAX/VISTARIL Take 1 tablet (25 mg total) by mouth 3 (three) times daily.  Indication: Feeling Anxious   insulin glargine 100 UNIT/ML injection Commonly known as: LANTUS Inject 0.2 mLs (20 Units total) into the skin at bedtime.  Indication: Type 2 Diabetes   metFORMIN 1000 MG tablet Commonly known as: GLUCOPHAGE Take 1 tablet (1,000 mg total) by mouth 2 (two) times daily with a meal.  Indication: Type 2 Diabetes   pantoprazole 40 MG tablet Commonly known as: PROTONIX Take 1 tablet (40 mg total) by mouth daily.  Indication: Gastroesophageal Reflux Disease   traZODone 150 MG tablet Commonly known as: DESYREL Take 1 tablet (150 mg total) by mouth at bedtime. What changed:   when to take this  reasons to take this  Indication:  Trouble Sleeping      Follow-up Information    Pc,  [facility] Follow up.   Why: Walk in hours are 9-4 MOnday through Friday.  Please bring copy of discharge paperwork, any insurance information and ID. Contact information: [place] [address] [phone]           Follow-up recommendations:  Continue activity as tolerated. Continue diet as recommended by your PCP. Ensure to keep all appointments with outpatient providers.  Comments:  Patient is instructed prior to discharge to: Take all medications as prescribed by his/her mental healthcare provider. Report any adverse effects and or reactions from the medicines to his/her outpatient provider promptly. Patient has been instructed & cautioned: To not engage in alcohol and or illegal drug use while on prescription medicines. In the event of worsening symptoms, patient is instructed to call the crisis hotline, 911 and or go to the nearest ED for appropriate evaluation and treatment of symptoms. To follow-up with his/her primary care provider for your other medical issues, concerns and or health care needs.    Signed: [place], FNP 03/21/2019, 10:01 AM

## 2019-03-21 NOTE — Progress Notes (Signed)
Patient ID: Colletta Maryland L. Manukyan, female   DOB: August 11, 1965, 54 y.o.   MRN: [id number]  Discharge Note:  Patient denies SI/HI/AVH at this time. Discharge instructions, AVS, prescriptions, and transition record gone over with patient. Patient agrees to comply with medication management, follow-up visit, and outpatient therapy. Patient belongings returned to patient. Patient questions and concerns addressed and answered. Patient ambulatory off unit. Patient discharged to parking lot to her car.

## 2019-03-21 NOTE — Progress Notes (Signed)
D- Patient alert and oriented. Patient presents in an anxious, but pleasant mood on assessment stating that she has been up since 2:30 am, but has no major complaints to voice to this Probation officer. Patient reported that this is the first anniversary of her Dad's death, which is why she's a little depressed. Patient also endorsed anxiety stating that she has no cigarette. Patient denies SI, HI, AVH, and pain at this time. Patient's goal for today is "going home today, I really need to go back to work".  A- Scheduled medications administered to patient, per MD orders. Support and encouragement provided.  Routine safety checks conducted every 15 minutes.  Patient informed to notify staff with problems or concerns.  R- No adverse drug reactions noted. Patient contracts for safety at this time. Patient compliant with medications and treatment plan. Patient receptive, calm, and cooperative. Patient interacts well with others on the unit.  Patient remains safe at this time.

## 2019-03-21 NOTE — Progress Notes (Signed)
Patient reported that she was experiencing difficulty staying asleep, secondary to "too much in my mind". Reported that she has been thinking about her father and could not have good sleep. Got up for vital sign and completed self inventory. Pleasant and cooperative. Expressed work concerns and needed to call her place of employment to update about her status. No additional concern. Encouragements and support provided. Safety monitored as expected.

## 2019-04-05 ENCOUNTER — Other Ambulatory Visit: Payer: Self-pay

## 2019-04-05 ENCOUNTER — Encounter: Payer: Self-pay | Admitting: Family Medicine

## 2019-04-05 ENCOUNTER — Ambulatory Visit: Payer: Self-pay

## 2019-04-05 ENCOUNTER — Ambulatory Visit: Payer: Self-pay | Admitting: Family Medicine

## 2019-04-05 VITALS — BP 128/90 | HR 78 | Temp 97.5°F

## 2019-04-05 DIAGNOSIS — N730 Acute parametritis and pelvic cellulitis: Secondary | ICD-10-CM

## 2019-04-05 DIAGNOSIS — F419 Anxiety disorder, unspecified: Secondary | ICD-10-CM

## 2019-04-05 DIAGNOSIS — F329 Major depressive disorder, single episode, unspecified: Secondary | ICD-10-CM

## 2019-04-05 DIAGNOSIS — Z23 Encounter for immunization: Secondary | ICD-10-CM

## 2019-04-05 DIAGNOSIS — Z9229 Personal history of other drug therapy: Secondary | ICD-10-CM

## 2019-04-05 LAB — WET PREP FOR TRICH, YEAST, CLUE
Trichomonas Exam: POSITIVE — AB
Yeast Exam: NEGATIVE

## 2019-04-05 MED ORDER — CEFTRIAXONE SODIUM 500 MG IJ SOLR
500.0000 mg | Freq: Once | INTRAMUSCULAR | Status: AC
  Administered 2019-04-05: 500 mg via INTRAMUSCULAR

## 2019-04-05 MED ORDER — CLOTRIMAZOLE 1 % VA CREA: 1.0000 | TOPICAL_CREAM | Freq: Every day | VAGINAL | 0 refills | Status: DC

## 2019-04-05 MED ORDER — DOXYCYCLINE HYCLATE 100 MG PO TABS: 100.0000 mg | ORAL_TABLET | Freq: Two times a day (BID) | ORAL | 0 refills | Status: AC

## 2019-04-05 MED ORDER — METRONIDAZOLE 500 MG PO TABS: 500.0000 mg | ORAL_TABLET | Freq: Two times a day (BID) | ORAL | 0 refills | Status: AC

## 2019-04-05 NOTE — Progress Notes (Signed)
Patient wet mount reviewed, patient treated per provider orders. Patient counseled to use the yeast medicine after completing antibiotics.Jenetta Downer, RN

## 2019-04-05 NOTE — Progress Notes (Signed)
Here today for STD screening. "I know it's Trich." Declines bloodwork. Hal Morales, RN

## 2019-04-05 NOTE — Addendum Note (Signed)
Addended by: Hal Morales A on: 04/05/2019 01:50 PM   Modules accepted: Orders

## 2019-04-05 NOTE — Progress Notes (Signed)
[facility] Department STI clinic/screening visit  Subjective:  Julie Lambert is a 54 y.o. female being seen today for  Chief Complaint  Patient presents with  . SEXUALLY TRANSMITTED DISEASE     The patient reports they do have symptoms. Patient reports that they do not desire a pregnancy in the next year. They reported they are not interested in discussing contraception today.   Patient has the following medical conditions:   Patient Active Problem List   Diagnosis Date Noted  . Bipolar 1 disorder, mixed, severe ([place]) 03/18/2019  . Bipolar 1 disorder, mixed ([place])   . Cocaine abuse with cocaine-induced mood disorder ([place]) 10/10/2018  . Major depression, recurrent ([place]) 09/03/2018  . Special screening for malignant neoplasms, colon 04/06/2018  . Liver hemangioma 03/01/2018  . Uncontrolled type 2 diabetes mellitus with hyperglycemia ([place]) 03/01/2018  . Tobacco use disorder 01/04/2018  . Suicidal ideation 01/04/2018  . Bipolar I disorder, most recent episode depressed, severe without psychotic features ([place]) 01/02/2018  . Diabetes mellitus without complication ([place]) 0000000  . Severe alcohol use disorder ([place]) 12/25/2017  . Cocaine use disorder, moderate, dependence ([place]) 12/25/2017    HPI  Pt reports she has been having discharge x3 days. Seen here in Dec, treated for trich. Doesn't know if partner was treated.   See flowsheet for further details and programmatic requirements.    Patient's last menstrual period was 07/22/2016 (approximate).  Desires EC? N/a - postmenopausal  Tdap: unknown Would like Hep A/B vaccines.   No components found for: HCV  The following portions of the patient's history were reviewed and updated as appropriate: allergies, current medications, past medical history, past social history, past surgical history and problem list.  Objective:   Vitals:   04/05/19 1228  BP: 128/90  Pulse: 78  Temp: (!) 97.5 F (36.4 C)      Physical Exam Vitals and nursing note reviewed.  Constitutional:      Appearance: Normal appearance.  HENT:     Head: Normocephalic and atraumatic.     Mouth/Throat:     Mouth: Mucous membranes are moist.     Pharynx: Oropharynx is clear. No oropharyngeal exudate or posterior oropharyngeal erythema.  Pulmonary:     Effort: Pulmonary effort is normal.  Abdominal:     General: Abdomen is flat.     Palpations: There is no mass.     Tenderness: There is no abdominal tenderness. There is no rebound.  Genitourinary:    General: Normal vulva.     Exam position: Lithotomy position.     Pubic Area: No rash or pubic lice.      Labia:        Right: No rash or lesion.        Left: No rash or lesion.      Vagina: Vaginal discharge (profuse, yellow/green, ph>4.5) present. No erythema, bleeding or lesions.     Cervix: Cervical motion tenderness and discharge present. No friability, lesion or erythema.     Uterus: Normal.      Adnexa:        Right: Tenderness present. No mass or fullness.         Left: Tenderness present. No mass or fullness.       Rectum: Normal.  Lymphadenopathy:     Head:     Right side of head: No preauricular or posterior auricular adenopathy.     Left side of head: No preauricular or posterior auricular adenopathy.     Cervical:  No cervical adenopathy.     Upper Body:     Right upper body: No supraclavicular or axillary adenopathy.     Left upper body: No supraclavicular or axillary adenopathy.     Lower Body: No right inguinal adenopathy. No left inguinal adenopathy.  Skin:    General: Skin is warm and dry.     Findings: No rash.  Neurological:     Mental Status: She is alert and oriented to person, place, and time.      Assessment and Plan:  Freeda L. Kemmerlin is a 54 y.o. female presenting to the [facility] Department for STI screening   1. PID (acute pelvic inflammatory disease) -Treat for PID today. Preg test n/a - pt is  postmenopausal -Pt is well appearing, vitals stable, able to come back in 3 days, deemed appropriate for outpatient treatment.  -Advised if pain or fever develops to go to ER.  -Discussed need for rescreen in 3 months.  -Contact card for partner given by RN. - metroNIDAZOLE (FLAGYL) 500 MG tablet; Take 1 tablet (500 mg total) by mouth 2 (two) times daily for 14 days.  Dispense: 28 tablet; Refill: 0 - cefTRIAXone (ROCEPHIN) injection 500 mg - doxycycline (VIBRA-TABS) 100 MG tablet; Take 1 tablet (100 mg total) by mouth 2 (two) times daily for 14 days.  Dispense: 28 tablet; Refill: 0 - clotrimazole (RA CLOTRIMAZOLE 7) 1 % vaginal cream; Place 1 Applicatorful vaginally at bedtime.  Dispense: 45 g; Refill: 0  2. Hepatitis A and hepatitis B vaccine administered Given today  3. Need for Tdap vaccination Pt declines  4. Anxiety and depression Pt endorses anxiety upon flowsheet questioning and would like to speak with behavioral health, will refer.  - Ambulatory referral to [facility]   Return in about 3 days (around 04/08/2019).  Future Appointments  Date Time Provider [place]  04/08/2019 11:10 AM AC-FP PROVIDER AC-FAM None  05/04/2019  3:15 PM AC-IMM NURSE AC-IMM None    Kandee Keen, PA-C

## 2019-04-08 ENCOUNTER — Ambulatory Visit: Payer: Self-pay

## 2019-04-10 NOTE — Progress Notes (Signed)
Chart reviewed by Pharmacist  Suzanne Walker PharmD, Contract Pharmacist at [facility]  

## 2019-04-10 NOTE — Progress Notes (Signed)
Chart reviewed by Pharmacist  Suzanne Walker PharmD, Contract Pharmacist at [facility]  

## 2019-04-19 ENCOUNTER — Ambulatory Visit: Payer: Self-pay

## 2019-05-04 ENCOUNTER — Ambulatory Visit: Payer: Self-pay

## 2019-05-05 ENCOUNTER — Emergency Department
Admission: EM | Admit: 2019-05-05 | Discharge: 2019-05-05 | Disposition: A | Discharge status: Home / Self Care | Payer: Self-pay | Attending: Emergency Medicine | Admitting: Emergency Medicine

## 2019-05-05 ENCOUNTER — Emergency Department: Payer: Self-pay

## 2019-05-05 ENCOUNTER — Other Ambulatory Visit: Payer: Self-pay

## 2019-05-05 DIAGNOSIS — R109 Unspecified abdominal pain: Secondary | ICD-10-CM | POA: Insufficient documentation

## 2019-05-05 DIAGNOSIS — F17210 Nicotine dependence, cigarettes, uncomplicated: Secondary | ICD-10-CM | POA: Insufficient documentation

## 2019-05-05 DIAGNOSIS — R531 Weakness: Secondary | ICD-10-CM | POA: Insufficient documentation

## 2019-05-05 DIAGNOSIS — Z794 Long term (current) use of insulin: Secondary | ICD-10-CM | POA: Insufficient documentation

## 2019-05-05 DIAGNOSIS — E119 Type 2 diabetes mellitus without complications: Secondary | ICD-10-CM | POA: Insufficient documentation

## 2019-05-05 DIAGNOSIS — R42 Dizziness and giddiness: Secondary | ICD-10-CM | POA: Insufficient documentation

## 2019-05-05 LAB — CBC WITH DIFFERENTIAL/PLATELET
Abs Immature Granulocytes: 0.04 10*3/uL (ref 0.00–0.07)
Basophils Absolute: 0.1 10*3/uL (ref 0.0–0.1)
Basophils Relative: 1 %
Eosinophils Absolute: 0.1 10*3/uL (ref 0.0–0.5)
Eosinophils Relative: 1 %
HCT: 38.6 % (ref 36.0–46.0)
Hemoglobin: 13.3 g/dL (ref 12.0–15.0)
Immature Granulocytes: 0 %
Lymphocytes Relative: 25 %
Lymphs Abs: 2.4 10*3/uL (ref 0.7–4.0)
MCH: 29.8 pg (ref 26.0–34.0)
MCHC: 34.5 g/dL (ref 30.0–36.0)
MCV: 86.5 fL (ref 80.0–100.0)
Monocytes Absolute: 0.6 10*3/uL (ref 0.1–1.0)
Monocytes Relative: 6 %
Neutro Abs: 6.6 10*3/uL (ref 1.7–7.7)
Neutrophils Relative %: 67 %
Platelets: 237 10*3/uL (ref 150–400)
RBC: 4.46 MIL/uL (ref 3.87–5.11)
RDW: 12.5 % (ref 11.5–15.5)
WBC: 9.8 10*3/uL (ref 4.0–10.5)
nRBC: 0 % (ref 0.0–0.2)

## 2019-05-05 LAB — COMPREHENSIVE METABOLIC PANEL
ALT: 14 U/L (ref 0–44)
AST: 16 U/L (ref 15–41)
Albumin: 3.7 g/dL (ref 3.5–5.0)
Alkaline Phosphatase: 115 U/L (ref 38–126)
Anion gap: 10 (ref 5–15)
BUN: 9 mg/dL (ref 6–20)
CO2: 23 mmol/L (ref 22–32)
Calcium: 8.7 mg/dL — ABNORMAL LOW (ref 8.9–10.3)
Chloride: 102 mmol/L (ref 98–111)
Creatinine, Ser: 0.58 mg/dL (ref 0.44–1.00)
GFR calc Af Amer: >60 mL/min (ref >60)
GFR calc non Af Amer: >60 mL/min (ref >60)
Glucose, Bld: 327 mg/dL — ABNORMAL HIGH (ref 70–99)
Potassium: 3.5 mmol/L (ref 3.5–5.1)
Sodium: 135 mmol/L (ref 135–145)
Total Bilirubin: 0.5 mg/dL (ref 0.3–1.2)
Total Protein: 7.6 g/dL (ref 6.5–8.1)

## 2019-05-05 LAB — URINALYSIS, COMPLETE (UACMP) WITH MICROSCOPIC
Bacteria, UA: NONE SEEN
Bilirubin Urine: NEGATIVE
Glucose, UA: 500 mg/dL — AB
Hgb urine dipstick: NEGATIVE
Ketones, ur: NEGATIVE mg/dL
Leukocytes,Ua: NEGATIVE
Nitrite: NEGATIVE
Protein, ur: NEGATIVE mg/dL
RBC / HPF: NONE SEEN RBC/hpf (ref 0–5)
Specific Gravity, Urine: 1.015 (ref 1.005–1.030)
WBC, UA: NONE SEEN WBC/hpf (ref 0–5)
pH: 6 (ref 5.0–8.0)

## 2019-05-05 LAB — BLOOD GAS, VENOUS
Acid-Base Excess: 0 mmol/L (ref 0.0–2.0)
Bicarbonate: 24.8 mmol/L (ref 20.0–28.0)
O2 Saturation: 93.5 %
Patient temperature: 37
pCO2, Ven: 40 mmHg — ABNORMAL LOW (ref 44.0–60.0)
pH, Ven: 7.4 (ref 7.250–7.430)
pO2, Ven: 69 mmHg — ABNORMAL HIGH (ref 32.0–45.0)

## 2019-05-05 LAB — URINE DRUG SCREEN, QUALITATIVE (ARMC ONLY)
Amphetamines, Ur Screen: NOT DETECTED
Barbiturates, Ur Screen: NOT DETECTED
Benzodiazepine, Ur Scrn: NOT DETECTED
Cannabinoid 50 Ng, Ur ~~LOC~~: NOT DETECTED
Cocaine Metabolite,Ur ~~LOC~~: POSITIVE — AB
MDMA (Ecstasy)Ur Screen: NOT DETECTED
Methadone Scn, Ur: NOT DETECTED
Opiate, Ur Screen: NOT DETECTED
Phencyclidine (PCP) Ur S: NOT DETECTED
Tricyclic, Ur Screen: NOT DETECTED

## 2019-05-05 LAB — GLUCOSE, CAPILLARY: Glucose-Capillary: 316 mg/dL — ABNORMAL HIGH (ref 70–99)

## 2019-05-05 LAB — ETHANOL: Alcohol, Ethyl (B): <10 mg/dL (ref <10)

## 2019-05-05 MED ORDER — SODIUM CHLORIDE 0.9 % IV BOLUS
1000.0000 mL | Freq: Once | INTRAVENOUS | Status: AC
  Administered 2019-05-05: 1000 mL via INTRAVENOUS

## 2019-05-05 NOTE — ED Provider Notes (Signed)
[facility] Emergency Department Provider Note  ____________________________________________   First MD Initiated Contact with Patient 05/05/19 [phone]     (approximate)  I have reviewed the triage vital signs and the nursing notes.   HISTORY  Chief Complaint Dizziness and Abdominal Pain    HPI Julie Lambert is a 54 y.o. female  With h/o bipolar disorder, substance abuse, DM, here with weakness, dizziness. History somewhat limited 2/2 weakness, drowsiness on arrival. Per report, pt went to work today and called out due to sensation of dizziness, lightheadedness. She also c/o some mild lower abd pain. On EMS arrival, tp slurred but no focal neuro deficits. She does admit she has not been checking her BG recently. No CP, headache. No known recent fever or chills. Pt falling asleep easily on my exam, limiting initial history.  Level 5 caveat invoked as remainder of history, ROS, and physical exam limited due to patient's mental status.         Past Medical History:  Diagnosis Date  . Bipolar 2 disorder ([place])   . Diabetes mellitus without complication ([place])   . Diverticulitis   . Diverticulosis   . GERD (gastroesophageal reflux disease)   . Sciatica   . Substance abuse ([place])   . Uterine fibroid     Patient Active Problem List   Diagnosis Date Noted  . Bipolar 1 disorder, mixed, severe ([place]) 03/18/2019  . Bipolar 1 disorder, mixed ([place])   . Cocaine abuse with cocaine-induced mood disorder ([place]) 10/10/2018  . Major depression, recurrent ([place]) 09/03/2018  . Special screening for malignant neoplasms, colon 04/06/2018  . Liver hemangioma 03/01/2018  . Uncontrolled type 2 diabetes mellitus with hyperglycemia ([place]) 03/01/2018  . Tobacco use disorder 01/04/2018  . Suicidal ideation 01/04/2018  . Bipolar I disorder, most recent episode depressed, severe without psychotic features ([place]) 01/02/2018  . Diabetes mellitus without complication ([place])  0000000  . Severe alcohol use disorder ([place]) 12/25/2017  . Cocaine use disorder, moderate, dependence ([place]) 12/25/2017    Past Surgical History:  Procedure Laterality Date  . BACK SURGERY    . CHOLECYSTECTOMY    . LUMBAR SPINE SURGERY      Prior to Admission medications   Medication Sig Start Date End Date Taking? Authorizing Provider  amantadine (SYMMETREL) 100 MG capsule Take 1 capsule (100 mg total) by mouth 2 (two) times daily. 03/21/19   Money, Lowry Ram, FNP  atorvastatin (LIPITOR) 10 MG tablet Take 1 tablet (10 mg total) by mouth daily at 6 PM. 03/21/19   Money, Lowry Ram, FNP  clotrimazole (RA CLOTRIMAZOLE 7) 1 % vaginal cream Place 1 Applicatorful vaginally at bedtime. 04/05/19   Staples, Dorinda Hill, PA-C  DULoxetine (CYMBALTA) 60 MG capsule Take 1 capsule (60 mg total) by mouth 2 (two) times daily. 03/21/19   Money, Lowry Ram, FNP  gabapentin (NEURONTIN) 400 MG capsule Take 1 capsule (400 mg total) by mouth 3 (three) times daily. 03/21/19   Money, Lowry Ram, FNP  hydrOXYzine (ATARAX/VISTARIL) 25 MG tablet Take 1 tablet (25 mg total) by mouth 3 (three) times daily. 03/21/19   Money, Lowry Ram, FNP  insulin glargine (LANTUS) 100 UNIT/ML injection Inject 0.2 mLs (20 Units total) into the skin at bedtime. 03/21/19   Money, Lowry Ram, FNP  metFORMIN (GLUCOPHAGE) 1000 MG tablet Take 1 tablet (1,000 mg total) by mouth 2 (two) times daily with a meal. 03/21/19   Money, Lowry Ram, FNP  pantoprazole (PROTONIX) 40 MG tablet Take 1 tablet (  40 mg total) by mouth daily. 03/21/19   Money, Lowry Ram, FNP  traZODone (DESYREL) 150 MG tablet Take 1 tablet (150 mg total) by mouth at bedtime. 03/21/19   Money, Lowry Ram, FNP    Allergies Ivp dye [iodinated diagnostic agents]  Family History  Problem Relation Age of Onset  . CAD Mother   . Diabetes Mother   . Heart disease Mother   . Parkinson's disease Mother   . Kidney disease Mother   . Cancer Father        prostate cancer  . Breast cancer Maternal Aunt   . Breast  cancer Maternal Grandmother     Social History Social History   Tobacco Use  . Smoking status: Current Every Day Smoker    Packs/day: 0.25    Years: 40.00    Pack years: 10.00    Types: Cigarettes  . Smokeless tobacco: Never Used  Substance Use Topics  . Alcohol use: Yes    Comment: a few times month/year  . Drug use: Not Currently    Types: "Crack" cocaine    Comment: last use 7/22, smoked crack    Review of Systems  Review of Systems  Unable to perform ROS: Mental status change  Constitutional: Positive for fatigue.  Neurological: Positive for dizziness and weakness.  All other systems reviewed and are negative.    ____________________________________________  PHYSICAL EXAM:      VITAL SIGNS: ED Triage Vitals  Enc Vitals Group     BP 05/05/19 0758 (!) 137/93     Pulse Rate 05/05/19 0758 94     Resp 05/05/19 0758 19     Temp 05/05/19 0758 98.9 F (37.2 C)     Temp src --      SpO2 05/05/19 0758 97 %     Weight 05/05/19 0754 180 lb (81.6 kg)     Height 05/05/19 0754 5\' 7"  (1.702 m)     Head Circumference --      Peak Flow --      Pain Score 05/05/19 0753 7     Pain Loc --      Pain Edu? --      Excl. in [place]? --      Physical Exam Vitals and nursing note reviewed.  Constitutional:      General: She is not in acute distress.    Appearance: She is well-developed.  HENT:     Head: Normocephalic and atraumatic.     Comments: Dry MM Eyes:     Conjunctiva/sclera: Conjunctivae normal.  Cardiovascular:     Rate and Rhythm: Normal rate and regular rhythm.     Heart sounds: Normal heart sounds.  Pulmonary:     Effort: Pulmonary effort is normal. No respiratory distress.     Breath sounds: No wheezing.  Abdominal:     General: There is no distension.  Musculoskeletal:     Cervical back: Neck supple.  Skin:    General: Skin is warm.     Capillary Refill: Capillary refill takes less than 2 seconds.     Findings: No rash.  Neurological:     Mental  Status: She is alert and oriented to person, place, and time.     Motor: No abnormal muscle tone.     Comments: Neurological Exam:  Mental Status: Drowsy, falls asleep easily. Speech slightly slurred but not aphasic. Cranial Nerves: Visual fields grossly intact. EOMI and PERRLA. No nystagmus noted. Facial sensation intact at forehead, maxillary cheek, and chin/mandible  bilaterally. No facial asymmetry or weakness. Hearing grossly normal. Uvula is midline, and palate elevates symmetrically. Normal SCM and trapezius strength. Tongue midline without fasciculations. Motor: Muscle strength 5/5 in proximal and distal UE and LE bilaterally. No pronator drift. Muscle tone normal.  Sensation: Intact to light touch in upper and lower extremities distally bilaterally.  Gait: Normal without ataxia. Coordination: Normal FTN bilaterally.          ____________________________________________   LABS (all labs ordered are listed, but only abnormal results are displayed)  Labs Reviewed  COMPREHENSIVE METABOLIC PANEL - Abnormal; Notable for the following components:      Result Value   Glucose, Bld 327 (*)    Calcium 8.7 (*)    All other components within normal limits  URINE DRUG SCREEN, QUALITATIVE ([facility] ONLY) - Abnormal; Notable for the following components:   Cocaine Metabolite,Ur [place] POSITIVE (*)    All other components within normal limits  BLOOD GAS, VENOUS - Abnormal; Notable for the following components:   pCO2, Ven 40 (*)    pO2, Ven 69.0 (*)    All other components within normal limits  URINALYSIS, COMPLETE (UACMP) WITH MICROSCOPIC - Abnormal; Notable for the following components:   Glucose, UA 500 (*)    All other components within normal limits  GLUCOSE, CAPILLARY - Abnormal; Notable for the following components:   Glucose-Capillary 316 (*)    All other components within normal limits  CBC WITH DIFFERENTIAL/PLATELET  ETHANOL  CBG MONITORING, ED     ____________________________________________  EKG: NSR, VR 96. QRS 99, QTc 495. LVH, no acute ST elevations or depressions.    EKG Interpretation  Date/Time:  Thursday May 05 2019 08:01:13 EDT Ventricular Rate:  96 PR Interval:    QRS Duration: 99 QT Interval:  391 QTC Calculation: 495 R Axis:   -52 Text Interpretation: Sinus rhythm Probable left atrial enlargement Left anterior fascicular block Left ventricular hypertrophy Anterior Q waves, possibly due to LVH Confirmed by UNCONFIRMED, DOCTOR ([id number]), editor Mel Almond, Tammy [phone]) on 05/05/2019 8:12:27 AM       ________________________________________  RADIOLOGY All imaging, including plain films, CT scans, and ultrasounds, independently reviewed by me, and interpretations confirmed via formal radiology reads.  ED MD interpretation:   CT Head: NAICA MRI Brain: Negative CXR: clear  Official radiology report(s): CT Head Wo Contrast  Result Date: 05/05/2019 CLINICAL DATA:  Per RN note in pt chart "Pt comes via [facility] from work with c/o dizziness and LLQ pain. Pt states this all started this am. Pt also states she had her 2nd COVID shot yesterday. 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 Pt states new cleaner being used and not sure if it was that or the shot that caused the headache. EMS reports BP-127/70, temp-98.1, CBG-387. EMS also reports pt is diabetic and doesn't check her BS before taking insulin. Pt states her meter is broken." EXAM: CT HEAD WITHOUT CONTRAST TECHNIQUE: Contiguous axial images were obtained from the base of the skull through the vertex without intravenous contrast. COMPARISON:  07/12/2017 FINDINGS: Brain: No evidence of acute infarction, hemorrhage, hydrocephalus, extra-axial collection or mass lesion/mass effect. Vascular: No hyperdense vessel or unexpected calcification. Skull: Normal. Negative for fracture or focal lesion. Sinuses/Orbits: Globes and orbits are unremarkable. Visualized sinuses and mastoid air cells  are clear. Other: None. IMPRESSION: Normal unenhanced CT scan of the brain. Electronically Signed   By: Lajean Manes M.D.   On: 05/05/2019 08:39   MR BRAIN WO CONTRAST  Result Date: 05/05/2019  CLINICAL DATA:  Ataxia with stroke suspected. EXAM: MRI HEAD WITHOUT CONTRAST TECHNIQUE: Multiplanar, multiecho pulse sequences of the brain and surrounding structures were obtained without intravenous contrast. COMPARISON:  Head CT from earlier today FINDINGS: Brain: No acute infarction, hemorrhage, hydrocephalus, extra-axial collection or mass lesion. No white matter disease or atrophy Vascular: Negative Skull and upper cervical spine: Normal marrow signal Sinuses/Orbits: Negative IMPRESSION: Negative brain MRI. Electronically Signed   By: Monte Fantasia M.D.   On: 05/05/2019 10:11   DG Chest Portable 1 View  Result Date: 05/05/2019 CLINICAL DATA:  Generalized weakness EXAM: PORTABLE CHEST 1 VIEW COMPARISON:  December 08, 2018. FINDINGS: Lungs are clear. Heart is upper normal in size with pulmonary vascularity normal. No adenopathy. No bone lesions. IMPRESSION: No edema or airspace opacity.  Heart upper normal in size. Electronically Signed   By: Lowella Grip III M.D.   On: 05/05/2019 08:52    ____________________________________________  PROCEDURES   Procedure(s) performed (including Critical Care):  Procedures  ____________________________________________  INITIAL IMPRESSION / MDM / [place] / ED COURSE  As part of my medical decision making, I reviewed the following data within the [place] notes reviewed and incorporated, Old chart reviewed, Notes from prior ED visits, and [place] Controlled Substance Database       *Julie Lambert was evaluated in Emergency Department on 05/05/2019 for the symptoms described in the history of present illness. She was evaluated in the context of the global COVID-19 pandemic, which necessitated consideration that the  patient might be at risk for infection with the SARS-CoV-2 virus that causes COVID-19. Institutional protocols and algorithms that pertain to the evaluation of patients at risk for COVID-19 are in a state of rapid change based on information released by regulatory bodies including the CDC and federal and state organizations. These policies and algorithms were followed during the patient's care in the ED.  Some ED evaluations and interventions may be delayed as a result of limited staffing during the pandemic.*  Clinical Course as of May 04 2029  Thu May 05, 2019  0828 Initial DDx includes acute encephalopathy 2/2 hyper/hypoglycemia, polypharmacy, substance use, COVID vaccination side effect. No focal neuro deficits but will check stat CT Head. CBG elevated here. VBG sent.   [CI]    Clinical Course User Index [CI] Duffy Bruce, MD    Medical Decision Making:  54 yo F here with multiple complaints, primarily dizziness, nausea, and possible confusion. This was reportedly after exposure to possible chemical fumes at work, though unclear if she awoke like this as well. Labs, imaging as above. Initial concern given age, HTN, and dizziness with AMS was CVA vs PRESS, but CT Head, MRI is very reassuring. She has no seizure like activity. Lab work is otherwise very reassuring with exception of mild hyperglycemia, but normal AG and no signs of DKA. VBG without hypercapnea or acidosis. UA without UTI. No PNA on CXR.  Of note, UDS + cocaine and it is possible this could be contributing to her fatigue, reported dizziness.  Following fluids and observation, pt is awake, alert, and in NAD. Given reassuring work up and imaging which shows no apparent cardiac, neurologic, infectious, or metabolic etiology, will advise her to stop using cocaine, hydrate, and better control her sugars. Given that this did occur w/ exposure to work chemicals, will have her discuss with her work re: appropriate equipment (though this  was cleaning chemicals only; no evidence to suggest methemoglobinemia, cyanide, or other  toxic metal/gas exposure).   ____________________________________________  FINAL CLINICAL IMPRESSION(S) / ED DIAGNOSES  Final diagnoses:  Dizziness  Weakness     MEDICATIONS GIVEN DURING THIS VISIT:  Medications  sodium chloride 0.9 % bolus 1,000 mL (0 mLs Intravenous Stopped 05/05/19 1038)     ED Discharge Orders    None       Note:  This document was prepared using Dragon voice recognition software and may include unintentional dictation errors.   Duffy Bruce, MD 05/05/19 2031

## 2019-05-05 NOTE — ED Notes (Signed)
Pt is back from CT. Pt is more talkative at this time. Pt on phone answering questions for MRI>

## 2019-05-05 NOTE — ED Notes (Addendum)
This RN spoke with [facility] lab tech requesting UA status update. EDP Issacs notified.

## 2019-05-05 NOTE — Discharge Instructions (Signed)
I would avoid working with the chemicals you were exposed to for AT LEAST the next 48 hours.  After this, only do so with APPROPRIATE MASK EQUIPMENT to prevent exposure  Avoid confined spaces when working with chemicals  Try to get more sleep, drink at least 6-8 glasses of water  Avoid any alcohol or drug use

## 2019-05-05 NOTE — ED Notes (Signed)
Pt taken to MRI  

## 2019-05-05 NOTE — ED Notes (Signed)
Pt provided with crackers, PB and water per pt;s request.

## 2019-05-05 NOTE — ED Triage Notes (Signed)
Pt comes via [facility] from work with c/o dizziness and LLQ pain. Pt states this all started this am.  Pt also states she had her 2nd COVID shot yesterday. 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 Pt states new cleaner being used and not sure if it was that or the shot that caused the headache.  EMS reports BP-127/70, temp-98.1, CBG-387  EMS also reports pt is diabetic and doesn't check her BS before taking insulin. Pt states her meter is broken.

## 2019-05-05 NOTE — ED Notes (Signed)
Pt taken to CT.

## 2019-05-05 NOTE — ED Notes (Signed)
Pt instructed that we need her to provide a urine specimen so we can rule out infection. Pt states she just peed with MRI and she will try again shortly.  MD aware

## 2019-05-12 ENCOUNTER — Telehealth: Payer: Self-pay | Admitting: Pharmacy Technician

## 2019-05-12 NOTE — Telephone Encounter (Signed)
Patient failed to provide 2021 proof of income.  No additional medication assistance will be provided by [facility] without the required proof of income documentation.  Patient notified by letter.  Aricela Bertagnolli J. Joanette Silveria Care Manager [facility]   [address] [place], [place]  [address]     This is to inform you that you are no longer eligible to receive medication assistance at [facility].  The reason(s) are:    _____Your total gross monthly household income exceeds 250% of the Federal Poverty Level.   _____Tangible assets (savings, checking, stocks/bonds, pension, retirement, etc.) exceeds our limit  _____You are eligible to receive benefits from Medicaid, [facility] or HIV Medication              Assistance Program _____You are eligible to receive benefits from a Medicare Part "D" plan _____You have prescription insurance  _____You are not an [place] resident __X__Failure to provide all requested proof of income information for 2021.    Medication assistance will resume once all requested financial information has been returned to our clinic.  If you have questions, please contact our clinic at [phone].    Thank you,  [facility] 

## 2019-05-28 IMAGING — DX DG CERVICAL SPINE COMPLETE 4+V
7 series · 8 of 8 positions shown · non-contrast
Comparison: None.

CLINICAL DATA: Neck pain, no known injury, initial encounter

EXAM:
CERVICAL SPINE - COMPLETE 4+ VIEW

[c-spine lat]
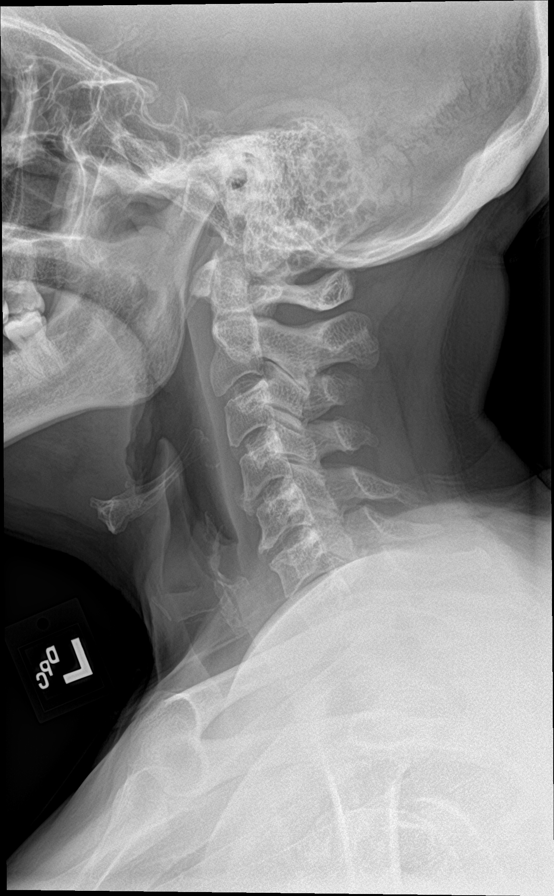

[c-spine obl (1 of 2)]
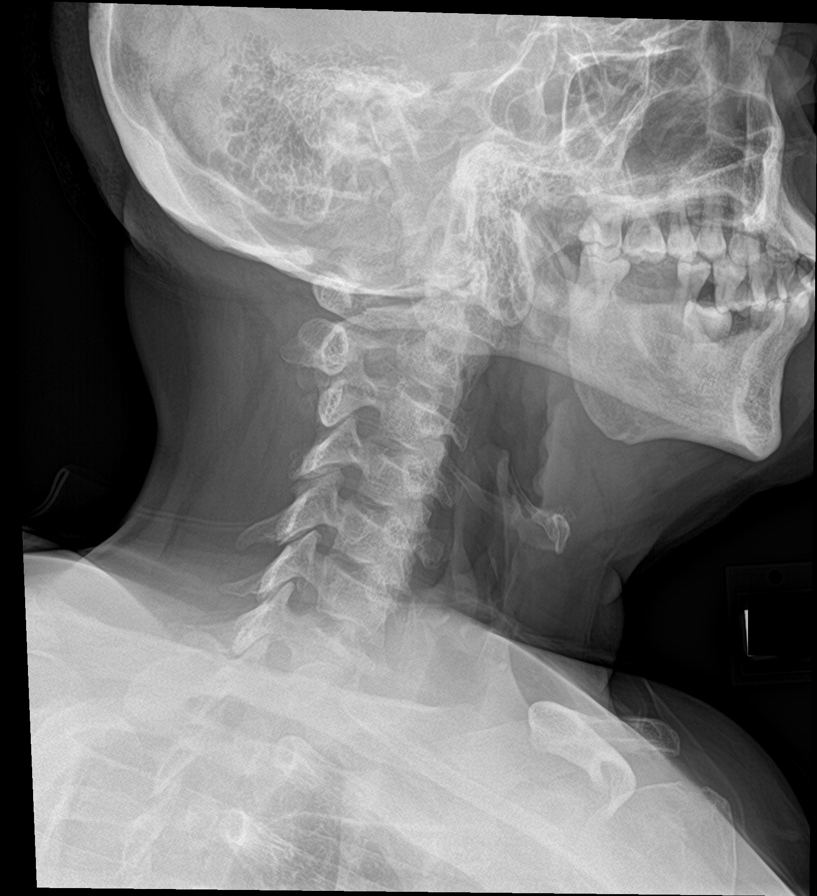

[c-spine obl (2 of 2)]
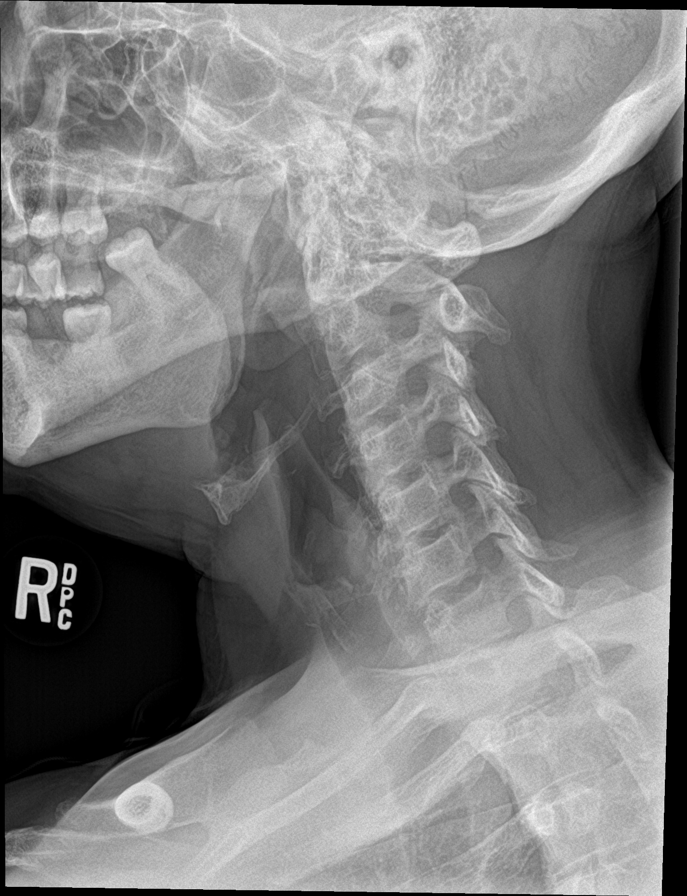

[c-spine ap (1 of 2)]
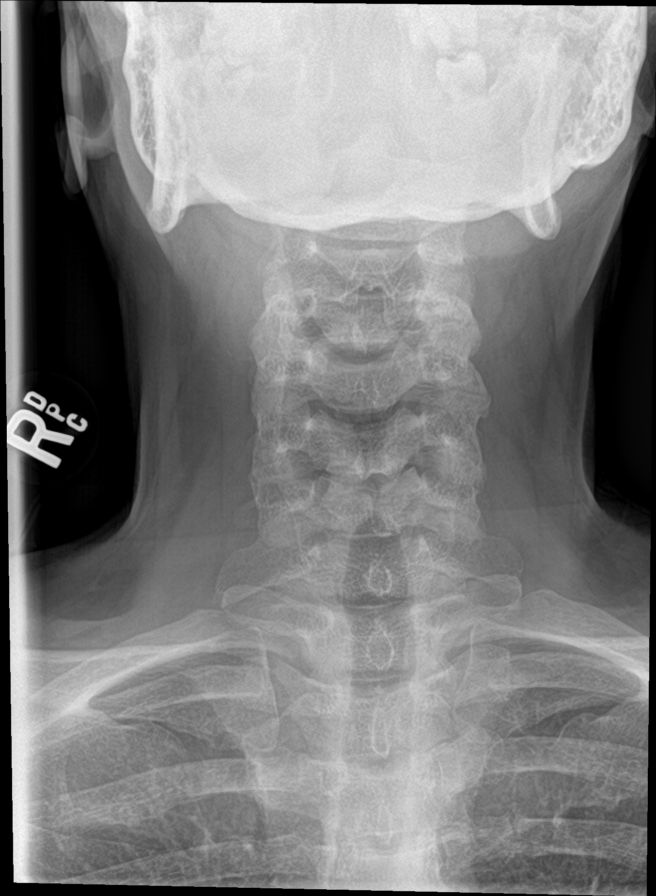

[Series 5: c-spine open mouth · 0.14mm/px · 2 of 2 slices shown]
[im 1/2]
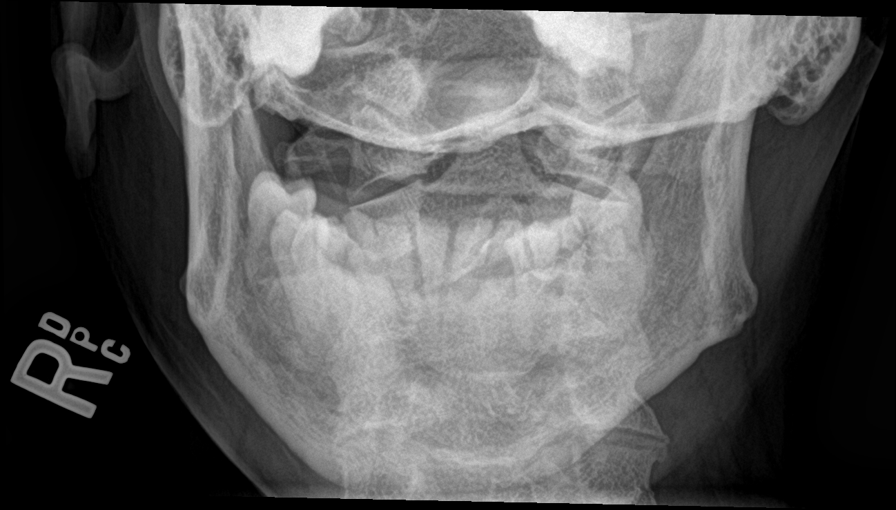
[im 2/2]
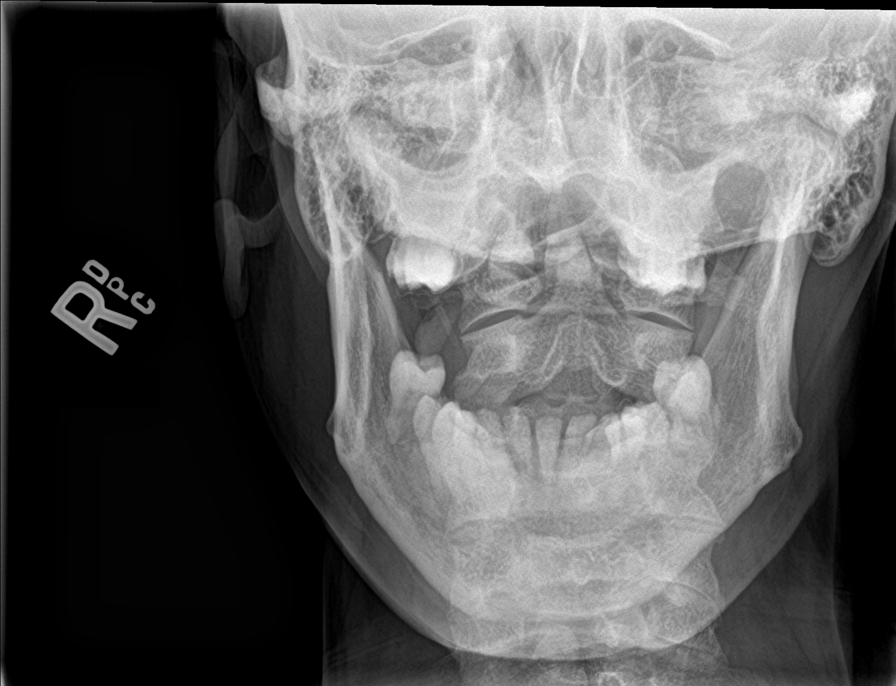

[c-spine swimmers trauma]
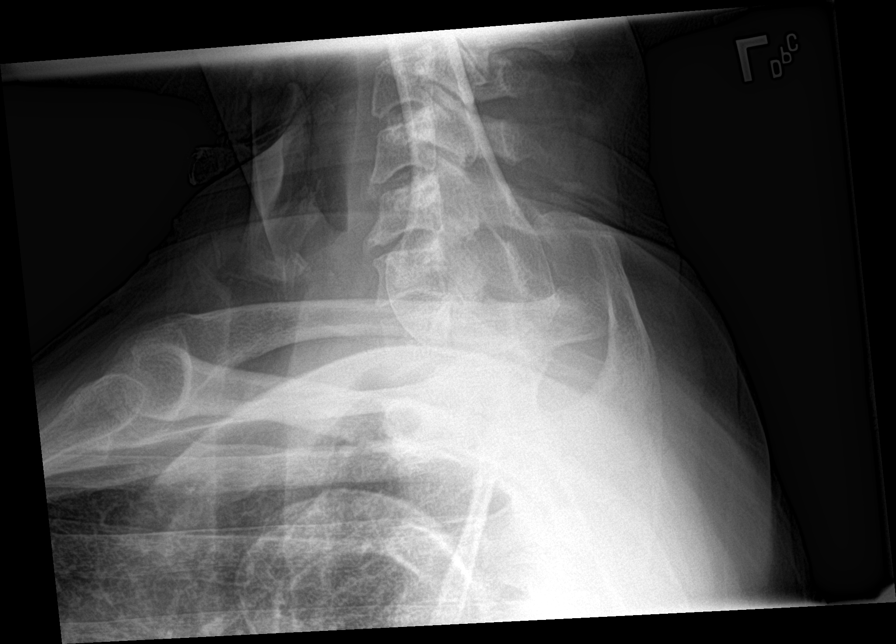

[c-spine ap (2 of 2)]
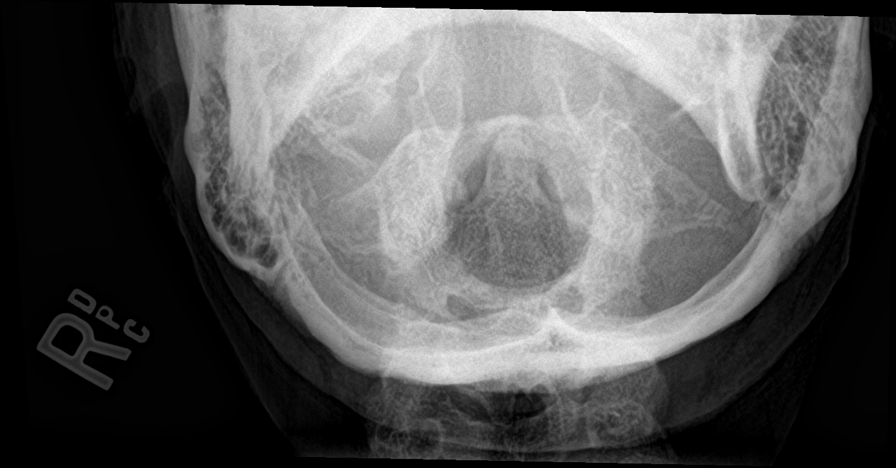

[8 of 8 positions shown; findings below may reference images not displayed]

FINDINGS: Seven cervical segments are well visualized. Vertebral body height
is well maintained. Osteophytic changes are noted from C4-C7. No
prevertebral soft tissue changes are noted. The neural foramina are
patent bilaterally. The odontoid is within normal limits. Visualized
upper ribcage is unremarkable.
IMPRESSION: Multilevel degenerative change without acute abnormality.

## 2019-07-06 ENCOUNTER — Telehealth: Payer: Self-pay | Admitting: Pharmacy Technician

## 2019-07-06 NOTE — Telephone Encounter (Signed)
Patient presented on 07/01/19 requesting refills on medications.  Patient has 1 refill left on prescriptions provided by [facility].  [facility] filled medications.  Explained to patient that  [facility] has attempted to collect financial information from her since February 2021, and no additional medication assistance would be provided without patient supplying the requested financial documentation.  Patient acknowledged that she understood.  Patient asked for referral to [facility].  Sending referral to [facility].  [facility]

## 2019-07-07 ENCOUNTER — Other Ambulatory Visit: Payer: Self-pay

## 2019-07-07 ENCOUNTER — Ambulatory Visit: Payer: Self-pay | Admitting: Physician Assistant

## 2019-07-07 ENCOUNTER — Ambulatory Visit (LOCAL_COMMUNITY_HEALTH_CENTER): Payer: Self-pay

## 2019-07-07 ENCOUNTER — Encounter: Payer: Self-pay | Admitting: Physician Assistant

## 2019-07-07 DIAGNOSIS — N760 Acute vaginitis: Secondary | ICD-10-CM

## 2019-07-07 DIAGNOSIS — Z113 Encounter for screening for infections with a predominantly sexual mode of transmission: Secondary | ICD-10-CM

## 2019-07-07 DIAGNOSIS — Z23 Encounter for immunization: Secondary | ICD-10-CM

## 2019-07-07 DIAGNOSIS — B9689 Other specified bacterial agents as the cause of diseases classified elsewhere: Secondary | ICD-10-CM

## 2019-07-07 LAB — WET PREP FOR TRICH, YEAST, CLUE
Trichomonas Exam: NEGATIVE
Yeast Exam: NEGATIVE

## 2019-07-07 MED ORDER — METRONIDAZOLE 500 MG PO TABS: 500.0000 mg | ORAL_TABLET | Freq: Two times a day (BID) | ORAL | 0 refills | Status: AC

## 2019-07-07 NOTE — Progress Notes (Signed)
[facility] Department STI clinic/screening visit  Subjective:  Julie Lambert is a 54 y.o. female being seen today for an STI screening visit. The patient reports they do have symptoms.  Patient reports that they do not desire a pregnancy in the next year.   They reported they are not interested in discussing contraception today.  Patient's last menstrual period was 07/22/2016 (approximate). Post-menopausal.   Patient has the following medical conditions:   Patient Active Problem List   Diagnosis Date Noted  . Bipolar 1 disorder, mixed, severe ([place]) 03/18/2019  . Bipolar 1 disorder, mixed ([place])   . Cocaine abuse with cocaine-induced mood disorder ([place]) 10/10/2018  . Major depression, recurrent ([place]) 09/03/2018  . Special screening for malignant neoplasms, colon 04/06/2018  . Liver hemangioma 03/01/2018  . Uncontrolled type 2 diabetes mellitus with hyperglycemia ([place]) 03/01/2018  . Tobacco use disorder 01/04/2018  . Suicidal ideation 01/04/2018  . Bipolar I disorder, most recent episode depressed, severe without psychotic features ([place]) 01/02/2018  . Diabetes mellitus without complication ([place]) 0000000  . Severe alcohol use disorder ([place]) 12/25/2017  . Cocaine use disorder, moderate, dependence ([place]) 12/25/2017    Chief Complaint  Patient presents with  . Exposure to STD    Woman here for STI eval.   Patient reports 2-3 day h/o vaginal odor/discharge. Is concerned she may have recurrence of Trich.  See flowsheet for further details and programmatic requirements.    The following portions of the patient's history were reviewed and updated as appropriate: allergies, current medications, past medical history, past social history, past surgical history and problem list.  Objective:  There were no vitals filed for this visit.  Physical Exam Constitutional:      Appearance: She is obese.  HENT:     Head: Normocephalic.     Mouth/Throat:     Mouth:  Mucous membranes are moist.     Pharynx: Oropharynx is clear. No oropharyngeal exudate or posterior oropharyngeal erythema.  Pulmonary:     Effort: Pulmonary effort is normal.  Abdominal:     Palpations: Abdomen is soft.     Tenderness: There is no abdominal tenderness. There is no guarding.  Genitourinary:    General: Normal vulva.     Pubic Area: No rash.      Labia:        Right: No tenderness or lesion.        Left: No tenderness or lesion.      Urethra: No urethral swelling or urethral lesion.     Vagina: Vaginal discharge present. No bleeding or lesions.     Cervix: No cervical motion tenderness, discharge, friability, lesion or erythema.     Uterus: Normal.      Comments: Scant white adherent vag discharge, pH >4.5. Lymphadenopathy:     Lower Body: No right inguinal adenopathy. No left inguinal adenopathy.  Skin:    General: Skin is warm.     Findings: No erythema or lesion.  Neurological:     Mental Status: She is alert and oriented to person, place, and time.  Psychiatric:        Behavior: Behavior normal.        Thought Content: Thought content normal.        Judgment: Judgment normal.      Assessment and Plan:  Julie Lambert is a 54 y.o. female presenting to the [facility] Department for STI screening  1. BV (bacterial vaginosis) Treat per s.o. - metroNIDAZOLE (FLAGYL) 500  MG tablet; Take 1 tablet (500 mg total) by mouth 2 (two) times daily for 7 days.  Dispense: 14 tablet; Refill: 0  2. Routine screening for STI (sexually transmitted infection) Hx/wet prep c/w BV. Await GC/Chlam results. 2nd Hep A/B vaccine today. - WET PREP FOR [place], [facility], [facility]     Return if symptoms worsen or fail to improve.  No future appointments.  Lora Havens, PA-C

## 2019-07-07 NOTE — Progress Notes (Signed)
Patient here for STD testing during Epic down time. Appointment information taken down on paper and is being transferred to chart at this time.  Wet mount reviewed and patient treated for BV per provider, A. Streilein orders. [place], dose #2 given today, see immunization document. Patient counseled next dose due in 5 months, VIS given.Jenetta Downer, RN

## 2019-07-13 ENCOUNTER — Telehealth: Payer: Self-pay | Admitting: General Practice

## 2019-07-13 NOTE — Telephone Encounter (Signed)
Received a referral from [facility] stating this patient was seeking PCP care but was still missing proof of income. I was able to leave a detailed voicemail for Julie Lambert stating what documents we would need to have her established at [facility]. Return number to call was left

## 2019-07-15 ENCOUNTER — Telehealth: Payer: Self-pay | Admitting: Family Medicine

## 2019-07-15 MED ORDER — SODIUM CHLORIDE 0.9 % IV SOLN
125.00 | INTRAVENOUS | Status: DC
Start: ? — End: 2019-07-15

## 2019-07-15 NOTE — Telephone Encounter (Signed)
Call to patient to discuss TR.  Patient verified by password.  Patient informed that TR were "inderterminate".  Offered patient recollection appointment.  Patient declined and reports that medications that were given have resolved s/sx.  Patient informed to call if s/sx return.  Patient verbalized understanding.  Junious Dresser, RN

## 2019-07-27 ENCOUNTER — Encounter: Payer: Self-pay | Admitting: Advanced Practice Midwife

## 2019-07-27 ENCOUNTER — Other Ambulatory Visit: Payer: Self-pay

## 2019-07-27 ENCOUNTER — Ambulatory Visit: Payer: Self-pay | Admitting: Advanced Practice Midwife

## 2019-07-27 DIAGNOSIS — F314 Bipolar disorder, current episode depressed, severe, without psychotic features: Secondary | ICD-10-CM

## 2019-07-27 DIAGNOSIS — Z62810 Personal history of physical and sexual abuse in childhood: Secondary | ICD-10-CM

## 2019-07-27 DIAGNOSIS — E119 Type 2 diabetes mellitus without complications: Secondary | ICD-10-CM

## 2019-07-27 DIAGNOSIS — Z113 Encounter for screening for infections with a predominantly sexual mode of transmission: Secondary | ICD-10-CM

## 2019-07-27 LAB — WET PREP FOR TRICH, YEAST, CLUE
Trichomonas Exam: NEGATIVE
Yeast Exam: NEGATIVE

## 2019-07-27 MED ORDER — CLOTRIMAZOLE 1 % VA CREA
1.0000 | TOPICAL_CREAM | Freq: Every day | VAGINAL | 0 refills | Status: AC
Start: 2019-07-27 — End: 2019-08-03
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
## 2019-07-27 NOTE — Progress Notes (Signed)
Here today for STD screening. Declines bloodwork. Lawerance Matsuo, RN ? ?

## 2019-07-27 NOTE — Addendum Note (Signed)
Addended by: Hal Morales A on: 07/27/2019 03:36 PM   Modules accepted: Orders

## 2019-07-27 NOTE — Progress Notes (Signed)
[employer] results reviewed by provider Ola Spurr, CNM. Yeast Cream given per provider orders. Hal Morales, RN

## 2019-08-10 ENCOUNTER — Emergency Department: Payer: Self-pay

## 2019-08-10 ENCOUNTER — Other Ambulatory Visit: Payer: Self-pay

## 2019-08-10 ENCOUNTER — Emergency Department
Admission: EM | Admit: 2019-08-10 | Discharge: 2019-08-10 | Disposition: A | Discharge status: Home / Self Care | Payer: Self-pay | Attending: Emergency Medicine | Admitting: Emergency Medicine

## 2019-08-10 ENCOUNTER — Encounter: Payer: Self-pay | Admitting: Emergency Medicine

## 2019-08-10 ENCOUNTER — Telehealth: Payer: Self-pay | Admitting: Pharmacy Technician

## 2019-08-10 DIAGNOSIS — E1165 Type 2 diabetes mellitus with hyperglycemia: Secondary | ICD-10-CM | POA: Insufficient documentation

## 2019-08-10 DIAGNOSIS — F1910 Other psychoactive substance abuse, uncomplicated: Secondary | ICD-10-CM | POA: Insufficient documentation

## 2019-08-10 DIAGNOSIS — R531 Weakness: Secondary | ICD-10-CM | POA: Insufficient documentation

## 2019-08-10 DIAGNOSIS — F17210 Nicotine dependence, cigarettes, uncomplicated: Secondary | ICD-10-CM | POA: Insufficient documentation

## 2019-08-10 DIAGNOSIS — Z794 Long term (current) use of insulin: Secondary | ICD-10-CM | POA: Insufficient documentation

## 2019-08-10 DIAGNOSIS — R42 Dizziness and giddiness: Secondary | ICD-10-CM

## 2019-08-10 DIAGNOSIS — Z79899 Other long term (current) drug therapy: Secondary | ICD-10-CM | POA: Insufficient documentation

## 2019-08-10 DIAGNOSIS — R519 Headache, unspecified: Secondary | ICD-10-CM | POA: Insufficient documentation

## 2019-08-10 DIAGNOSIS — R079 Chest pain, unspecified: Secondary | ICD-10-CM | POA: Insufficient documentation

## 2019-08-10 DIAGNOSIS — R200 Anesthesia of skin: Secondary | ICD-10-CM | POA: Insufficient documentation

## 2019-08-10 LAB — CBC
HCT: 37.1 % (ref 36.0–46.0)
Hemoglobin: 12.7 g/dL (ref 12.0–15.0)
MCH: 29.7 pg (ref 26.0–34.0)
MCHC: 34.2 g/dL (ref 30.0–36.0)
MCV: 86.7 fL (ref 80.0–100.0)
Platelets: 230 10*3/uL (ref 150–400)
RBC: 4.28 MIL/uL (ref 3.87–5.11)
RDW: 13.3 % (ref 11.5–15.5)
WBC: 8.8 10*3/uL (ref 4.0–10.5)
nRBC: 0 % (ref 0.0–0.2)

## 2019-08-10 LAB — URINALYSIS, COMPLETE (UACMP) WITH MICROSCOPIC
Bacteria, UA: NONE SEEN
Bilirubin Urine: NEGATIVE
Glucose, UA: >=500 mg/dL — AB
Hgb urine dipstick: NEGATIVE
Ketones, ur: NEGATIVE mg/dL
Leukocytes,Ua: NEGATIVE
Nitrite: NEGATIVE
Protein, ur: NEGATIVE mg/dL
Specific Gravity, Urine: 1.027 (ref 1.005–1.030)
Squamous Epithelial / HPF: NONE SEEN (ref 0–5)
pH: 5 (ref 5.0–8.0)

## 2019-08-10 LAB — COMPREHENSIVE METABOLIC PANEL
ALT: 14 U/L (ref 0–44)
AST: 15 U/L (ref 15–41)
Albumin: 3.4 g/dL — ABNORMAL LOW (ref 3.5–5.0)
Alkaline Phosphatase: 126 U/L (ref 38–126)
Anion gap: 9 (ref 5–15)
BUN: 13 mg/dL (ref 6–20)
CO2: 23 mmol/L (ref 22–32)
Calcium: 8.9 mg/dL (ref 8.9–10.3)
Chloride: 106 mmol/L (ref 98–111)
Creatinine, Ser: 0.72 mg/dL (ref 0.44–1.00)
GFR calc Af Amer: >60 mL/min (ref >60)
GFR calc non Af Amer: >60 mL/min (ref >60)
Glucose, Bld: 419 mg/dL — ABNORMAL HIGH (ref 70–99)
Potassium: 4.1 mmol/L (ref 3.5–5.1)
Sodium: 138 mmol/L (ref 135–145)
Total Bilirubin: 0.6 mg/dL (ref 0.3–1.2)
Total Protein: 6.6 g/dL (ref 6.5–8.1)

## 2019-08-10 LAB — TROPONIN I (HIGH SENSITIVITY): Troponin I (High Sensitivity): 9 ng/L (ref <18)

## 2019-08-10 MED ORDER — KETOROLAC TROMETHAMINE 30 MG/ML IJ SOLN
15.0000 mg | Freq: Once | INTRAMUSCULAR | Status: AC
  Administered 2019-08-10: 15 mg via INTRAVENOUS

## 2019-08-10 MED ORDER — PANTOPRAZOLE SODIUM 40 MG PO TBEC: 40.0000 mg | DELAYED_RELEASE_TABLET | Freq: Every day | ORAL | 0 refills | Status: DC

## 2019-08-10 MED ORDER — ATORVASTATIN CALCIUM 10 MG PO TABS: 10.0000 mg | ORAL_TABLET | Freq: Every day | ORAL | 0 refills | Status: DC

## 2019-08-10 MED ORDER — SODIUM CHLORIDE 0.9 % IV BOLUS
1000.0000 mL | Freq: Once | INTRAVENOUS | Status: AC
  Administered 2019-08-10: 1000 mL via INTRAVENOUS

## 2019-08-10 MED ORDER — LORAZEPAM 2 MG/ML IJ SOLN
0.5000 mg | Freq: Once | INTRAMUSCULAR | Status: AC
  Administered 2019-08-10: 0.5 mg via INTRAVENOUS
  Filled 2019-08-10: qty 1

## 2019-08-10 MED ORDER — METFORMIN HCL 1000 MG PO TABS: 1000.0000 mg | ORAL_TABLET | Freq: Two times a day (BID) | ORAL | 0 refills | Status: DC

## 2019-08-10 MED ORDER — DULOXETINE HCL 60 MG PO CPEP: 60.0000 mg | ORAL_CAPSULE | Freq: Two times a day (BID) | ORAL | 0 refills | Status: DC

## 2019-08-10 MED ORDER — GABAPENTIN 400 MG PO CAPS: 400.0000 mg | ORAL_CAPSULE | Freq: Three times a day (TID) | ORAL | 0 refills | Status: DC

## 2019-08-10 MED ORDER — TRAZODONE HCL 150 MG PO TABS: 150.0000 mg | ORAL_TABLET | Freq: Every day | ORAL | 0 refills | Status: DC
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
## 2019-08-10 NOTE — ED Triage Notes (Signed)
Patient reports feeling bad for the past few days. States she is dizzy and has chest pain as well as generalized body aches.

## 2019-08-10 NOTE — Telephone Encounter (Signed)
Patient provided 2020 Federal Tax Return.  Still need last 30 days of paystubs.  Patient stated that she would e-mail to [facility]  [facility]

## 2019-08-10 NOTE — ED Notes (Signed)
E-signature not working at this time. Pt verbalized understanding of D/C instructions, prescriptions and follow up care with no further questions at this time. Pt in NAD and ambulatory at time of D/C.  

## 2019-08-18 ENCOUNTER — Other Ambulatory Visit: Payer: Self-pay

## 2019-08-18 ENCOUNTER — Ambulatory Visit
Admission: EM | Admit: 2019-08-18 | Discharge: 2019-08-18 | Disposition: A | Discharge status: Home / Self Care | Payer: Self-pay | Attending: Internal Medicine | Admitting: Internal Medicine

## 2019-08-18 DIAGNOSIS — H1089 Other conjunctivitis: Secondary | ICD-10-CM

## 2019-08-18 DIAGNOSIS — H5789 Other specified disorders of eye and adnexa: Secondary | ICD-10-CM

## 2019-08-18 MED ORDER — BACITRACIN-POLYMYXIN B 500-10000 UNIT/GM OP OINT
1.0000 | TOPICAL_OINTMENT | Freq: Two times a day (BID) | OPHTHALMIC | 0 refills | Status: AC
Start: 2019-08-18 — End: 2019-08-23

## 2019-08-18 MED ORDER — KETOROLAC TROMETHAMINE 0.5 % OP SOLN
1.0000 [drp] | Freq: Four times a day (QID) | OPHTHALMIC | 0 refills | Status: DC
Start: 2019-08-18 — End: 2019-08-18

## 2019-08-18 MED ORDER — KETOROLAC TROMETHAMINE 0.5 % OP SOLN
1.0000 [drp] | Freq: Four times a day (QID) | OPHTHALMIC | 0 refills | Status: AC
Start: 2019-08-18 — End: 2019-08-23

## 2019-08-18 NOTE — ED Triage Notes (Signed)
Patient complains of bilateral eye pain that started yesterday morning. Patient states that her eye sting and burn. Reports that she slept in her contacts and that normally does not effect her.

## 2019-08-18 NOTE — Discharge Instructions (Signed)
Use eye drops as directed If your symptoms worsen please call your eye doctor immediately.
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
## 2019-09-20 ENCOUNTER — Telehealth: Payer: Self-pay | Admitting: Pharmacy Technician

## 2019-09-20 NOTE — Telephone Encounter (Signed)
Patient failed to provide 2021 proof of income.  No additional medication assistance will be provided by [facility] without the required proof of income documentation.  Patient notified by letter.  [facility]    [address], [place]  [address]   September 20, 2019    [address] [place], [place]  [address]  Dear Ms. Goltz:  This is to inform you that you are no longer eligible to receive medication assistance at [facility].  The reason(s) are:    _____Your total gross monthly household income exceeds 250% of the Federal Poverty Level.   _____Tangible assets (savings, checking, stocks/bonds, pension, retirement, etc.) exceeds our limit  _____You are eligible to receive benefits from [facility], [facility] or HIV Medication              Assistance Program _____You are eligible to receive benefits from a Medicare Part "D" plan _____You have prescription insurance  _____You are not an [facility] resident __X__Failure to provide all requested proof of income information for 2021.  We still need your last 30 days of paystubs and bank statement.    Medication assistance will resume once all requested financial information has been returned to our clinic.  If you have questions, please contact our clinic at [phone].    Thank you,  [facility]

## 2019-10-12 ENCOUNTER — Other Ambulatory Visit: Payer: Self-pay

## 2019-10-12 ENCOUNTER — Emergency Department
Admission: EM | Admit: 2019-10-12 | Discharge: 2019-10-13 | Disposition: A | Discharge status: Home / Self Care | Payer: Self-pay | Attending: Emergency Medicine | Admitting: Emergency Medicine

## 2019-10-12 ENCOUNTER — Emergency Department: Payer: Self-pay

## 2019-10-12 DIAGNOSIS — F3163 Bipolar disorder, current episode mixed, severe, without psychotic features: Secondary | ICD-10-CM | POA: Diagnosis present

## 2019-10-12 DIAGNOSIS — R45851 Suicidal ideations: Secondary | ICD-10-CM

## 2019-10-12 DIAGNOSIS — F339 Major depressive disorder, recurrent, unspecified: Secondary | ICD-10-CM | POA: Diagnosis present

## 2019-10-12 DIAGNOSIS — Z20822 Contact with and (suspected) exposure to covid-19: Secondary | ICD-10-CM | POA: Insufficient documentation

## 2019-10-12 DIAGNOSIS — N3091 Cystitis, unspecified with hematuria: Secondary | ICD-10-CM | POA: Insufficient documentation

## 2019-10-12 DIAGNOSIS — F329 Major depressive disorder, single episode, unspecified: Secondary | ICD-10-CM | POA: Insufficient documentation

## 2019-10-12 DIAGNOSIS — F1020 Alcohol dependence, uncomplicated: Secondary | ICD-10-CM | POA: Diagnosis present

## 2019-10-12 DIAGNOSIS — F1414 Cocaine abuse with cocaine-induced mood disorder: Secondary | ICD-10-CM | POA: Diagnosis present

## 2019-10-12 DIAGNOSIS — N390 Urinary tract infection, site not specified: Secondary | ICD-10-CM

## 2019-10-12 DIAGNOSIS — R319 Hematuria, unspecified: Secondary | ICD-10-CM

## 2019-10-12 DIAGNOSIS — E1165 Type 2 diabetes mellitus with hyperglycemia: Secondary | ICD-10-CM | POA: Insufficient documentation

## 2019-10-12 DIAGNOSIS — F17210 Nicotine dependence, cigarettes, uncomplicated: Secondary | ICD-10-CM | POA: Insufficient documentation

## 2019-10-12 DIAGNOSIS — F32A Depression, unspecified: Secondary | ICD-10-CM

## 2019-10-12 DIAGNOSIS — F17200 Nicotine dependence, unspecified, uncomplicated: Secondary | ICD-10-CM | POA: Diagnosis present

## 2019-10-12 DIAGNOSIS — F3160 Bipolar disorder, current episode mixed, unspecified: Secondary | ICD-10-CM | POA: Diagnosis present

## 2019-10-12 DIAGNOSIS — Z794 Long term (current) use of insulin: Secondary | ICD-10-CM | POA: Insufficient documentation

## 2019-10-12 DIAGNOSIS — F1420 Cocaine dependence, uncomplicated: Secondary | ICD-10-CM | POA: Diagnosis present

## 2019-10-12 DIAGNOSIS — Z79899 Other long term (current) drug therapy: Secondary | ICD-10-CM | POA: Insufficient documentation

## 2019-10-12 DIAGNOSIS — F314 Bipolar disorder, current episode depressed, severe, without psychotic features: Secondary | ICD-10-CM

## 2019-10-12 LAB — CBC
HCT: 42.5 % (ref 36.0–46.0)
Hemoglobin: 15.3 g/dL — ABNORMAL HIGH (ref 12.0–15.0)
MCH: 30.4 pg (ref 26.0–34.0)
MCHC: 36 g/dL (ref 30.0–36.0)
MCV: 84.3 fL (ref 80.0–100.0)
Platelets: 303 10*3/uL (ref 150–400)
RBC: 5.04 MIL/uL (ref 3.87–5.11)
RDW: 13 % (ref 11.5–15.5)
WBC: 15.8 10*3/uL — ABNORMAL HIGH (ref 4.0–10.5)
nRBC: 0 % (ref 0.0–0.2)

## 2019-10-12 LAB — URINALYSIS, COMPLETE (UACMP) WITH MICROSCOPIC
Bilirubin Urine: NEGATIVE
Glucose, UA: >=500 mg/dL — AB
Hgb urine dipstick: NEGATIVE
Ketones, ur: 5 mg/dL — AB
Nitrite: NEGATIVE
Protein, ur: NEGATIVE mg/dL
Specific Gravity, Urine: 1.005 (ref 1.005–1.030)
pH: 5 (ref 5.0–8.0)

## 2019-10-12 LAB — URINE DRUG SCREEN, QUALITATIVE (ARMC ONLY)
Amphetamines, Ur Screen: NOT DETECTED
Barbiturates, Ur Screen: NOT DETECTED
Benzodiazepine, Ur Scrn: NOT DETECTED
Cannabinoid 50 Ng, Ur ~~LOC~~: NOT DETECTED
Cocaine Metabolite,Ur ~~LOC~~: POSITIVE — AB
MDMA (Ecstasy)Ur Screen: NOT DETECTED
Methadone Scn, Ur: NOT DETECTED
Opiate, Ur Screen: NOT DETECTED
Phencyclidine (PCP) Ur S: NOT DETECTED
Tricyclic, Ur Screen: NOT DETECTED

## 2019-10-12 LAB — COMPREHENSIVE METABOLIC PANEL
ALT: 18 U/L (ref 0–44)
AST: 17 U/L (ref 15–41)
Albumin: 4.5 g/dL (ref 3.5–5.0)
Alkaline Phosphatase: 116 U/L (ref 38–126)
Anion gap: 16 — ABNORMAL HIGH (ref 5–15)
BUN: 8 mg/dL (ref 6–20)
CO2: 17 mmol/L — ABNORMAL LOW (ref 22–32)
Calcium: 9.6 mg/dL (ref 8.9–10.3)
Chloride: 104 mmol/L (ref 98–111)
Creatinine, Ser: 0.65 mg/dL (ref 0.44–1.00)
GFR calc Af Amer: >60 mL/min (ref >60)
GFR calc non Af Amer: >60 mL/min (ref >60)
Glucose, Bld: 233 mg/dL — ABNORMAL HIGH (ref 70–99)
Potassium: 3.8 mmol/L (ref 3.5–5.1)
Sodium: 137 mmol/L (ref 135–145)
Total Bilirubin: 0.9 mg/dL (ref 0.3–1.2)
Total Protein: 8.3 g/dL — ABNORMAL HIGH (ref 6.5–8.1)

## 2019-10-12 LAB — ETHANOL: Alcohol, Ethyl (B): 30 mg/dL — ABNORMAL HIGH (ref <10)

## 2019-10-12 LAB — TROPONIN I (HIGH SENSITIVITY): Troponin I (High Sensitivity): 14 ng/L (ref <18)

## 2019-10-12 MED ORDER — GABAPENTIN 300 MG PO CAPS
400.0000 mg | ORAL_CAPSULE | Freq: Three times a day (TID) | ORAL | Status: DC
  Administered 2019-10-12 – 2019-10-13 (×3): 400 mg via ORAL
  Filled 2019-10-12 (×3): qty 1

## 2019-10-12 MED ORDER — CEPHALEXIN 500 MG PO CAPS
500.0000 mg | ORAL_CAPSULE | Freq: Three times a day (TID) | ORAL | Status: DC
  Administered 2019-10-12 – 2019-10-13 (×3): 500 mg via ORAL
  Filled 2019-10-12 (×5): qty 1

## 2019-10-12 MED ORDER — CITALOPRAM HYDROBROMIDE 20 MG PO TABS
20.0000 mg | ORAL_TABLET | Freq: Every day | ORAL | Status: DC
  Filled 2019-10-12: qty 1

## 2019-10-12 MED ORDER — TRAZODONE HCL 50 MG PO TABS
150.0000 mg | ORAL_TABLET | Freq: Every day | ORAL | Status: DC
  Administered 2019-10-12: 150 mg via ORAL
  Filled 2019-10-12: qty 1

## 2019-10-12 MED ORDER — HYDROXYZINE HCL 25 MG PO TABS
25.0000 mg | ORAL_TABLET | Freq: Four times a day (QID) | ORAL | Status: DC | PRN
  Filled 2019-10-12: qty 1

## 2019-10-12 MED ORDER — DICYCLOMINE HCL 20 MG PO TABS
20.0000 mg | ORAL_TABLET | Freq: Four times a day (QID) | ORAL | Status: DC | PRN
  Filled 2019-10-12: qty 1

## 2019-10-12 MED ORDER — HYDROXYZINE HCL 25 MG PO TABS
25.0000 mg | ORAL_TABLET | Freq: Three times a day (TID) | ORAL | Status: DC
  Administered 2019-10-12 – 2019-10-13 (×3): 25 mg via ORAL
  Filled 2019-10-12 (×2): qty 1

## 2019-10-12 MED ORDER — INSULIN GLARGINE 100 UNIT/ML ~~LOC~~ SOLN
20.0000 [IU] | Freq: Every day | SUBCUTANEOUS | Status: DC
  Administered 2019-10-13: 20 [IU] via SUBCUTANEOUS
  Filled 2019-10-12 (×2): qty 0.2

## 2019-10-12 MED ORDER — THIAMINE HCL 100 MG PO TABS
100.0000 mg | ORAL_TABLET | Freq: Once | ORAL | Status: AC
  Administered 2019-10-12: 100 mg via ORAL
  Filled 2019-10-12: qty 1

## 2019-10-12 MED ORDER — METHOCARBAMOL 500 MG PO TABS
500.0000 mg | ORAL_TABLET | Freq: Three times a day (TID) | ORAL | Status: DC | PRN
  Filled 2019-10-12: qty 1

## 2019-10-12 MED ORDER — CLONIDINE HCL 0.1 MG PO TABS: 0.1000 mg | ORAL_TABLET | ORAL | Status: DC

## 2019-10-12 MED ORDER — THIAMINE HCL 100 MG/ML IJ SOLN
Freq: Once | INTRAVENOUS | Status: DC
  Filled 2019-10-12: qty 1000

## 2019-10-12 MED ORDER — NAPROXEN 500 MG PO TABS
500.0000 mg | ORAL_TABLET | Freq: Two times a day (BID) | ORAL | Status: DC | PRN
  Filled 2019-10-12: qty 1

## 2019-10-12 MED ORDER — PANTOPRAZOLE SODIUM 40 MG PO TBEC
40.0000 mg | DELAYED_RELEASE_TABLET | Freq: Every day | ORAL | Status: DC
  Administered 2019-10-13: 40 mg via ORAL
  Filled 2019-10-12: qty 1

## 2019-10-12 MED ORDER — CLONIDINE HCL 0.1 MG PO TABS
0.1000 mg | ORAL_TABLET | Freq: Four times a day (QID) | ORAL | Status: DC
  Administered 2019-10-12 – 2019-10-13 (×3): 0.1 mg via ORAL
  Filled 2019-10-12 (×3): qty 1

## 2019-10-12 MED ORDER — METFORMIN HCL 500 MG PO TABS
1000.0000 mg | ORAL_TABLET | Freq: Two times a day (BID) | ORAL | Status: DC
  Administered 2019-10-13 (×2): 1000 mg via ORAL
  Filled 2019-10-12 (×2): qty 2

## 2019-10-12 MED ORDER — FOLIC ACID 1 MG PO TABS
1.0000 mg | ORAL_TABLET | Freq: Once | ORAL | Status: AC
  Administered 2019-10-12: 1 mg via ORAL
  Filled 2019-10-12: qty 1

## 2019-10-12 MED ORDER — CLONIDINE HCL 0.1 MG PO TABS: 0.1000 mg | ORAL_TABLET | Freq: Every day | ORAL | Status: DC

## 2019-10-12 MED ORDER — LOPERAMIDE HCL 2 MG PO CAPS
2.0000 mg | ORAL_CAPSULE | ORAL | Status: DC | PRN
  Filled 2019-10-12: qty 2

## 2019-10-12 MED ORDER — ADULT MULTIVITAMIN W/MINERALS CH
1.0000 | ORAL_TABLET | Freq: Once | ORAL | Status: AC
  Administered 2019-10-12: 1 via ORAL
  Filled 2019-10-12: qty 1

## 2019-10-12 MED ORDER — ONDANSETRON 4 MG PO TBDP
4.0000 mg | ORAL_TABLET | Freq: Four times a day (QID) | ORAL | Status: DC | PRN
  Filled 2019-10-12: qty 1

## 2019-10-12 NOTE — BH Assessment (Signed)
Assessment Note  Julie Lambert is an 54 y.o. female presenting to [facility] ED voluntarily for depression and substance abuse. Per triage note Pt states she has been depressed for some time, ran out of cymbalta and trazodone recently and also relapsed with drinking. States has also been using crack, states has had suicidal thoughts today, no plan. During assessment patient was alert and oriented x4, calm and cooperative, but appeared depressed and anxious. When asked why patient was presenting to the ED patient reported "I've been drinking for 2 days straight." Patient reported that she recently completed a substance abuse program with [facility] and is now living in a boarding house. "I've been depressed too for a long time, I'm a little overwhelmed and some days I don't want to be here any more." Patient reported "I'm overwhelmed with work and my mother lives in a assisted living facility." Patient reported being clean from Cocaine and alcohol for 9 months and recently relapsed. Patient reports poor sleep and apetite. Patient continues to report some passive SI and reported that she does not know if she wants to hurt herself or not. Patient does report 1 past suicide attempt "it happened in the 90s." Patient denies HI/AH/VH and does not appear to be responding to any internal or external stimuli.  Per Psyc NP Ysidro Evert, patient is recommended for Inpatient Hospitalization  Diagnosis: Depression, Cocaine Use, Alcohol Use  Past Medical History:  Past Medical History:  Diagnosis Date  . Bipolar 2 disorder ([place])   . Diabetes mellitus without complication ([place])   . Diverticulitis   . Diverticulosis   . GERD (gastroesophageal reflux disease)   . Sciatica   . Substance abuse ([place])   . Uterine fibroid     Past Surgical History:  Procedure Laterality Date  . BACK SURGERY    . CHOLECYSTECTOMY    . LUMBAR SPINE SURGERY      Family History:  Family History  Problem Relation Age of Onset  .  CAD Mother   . Diabetes Mother   . Heart disease Mother   . Parkinson's disease Mother   . Kidney disease Mother   . Cancer Father        prostate cancer  . Breast cancer Maternal Aunt   . Breast cancer Maternal Grandmother     Social History:  reports that she has been smoking cigarettes. She has a 10.00 pack-year smoking history. She has never used smokeless tobacco. She reports current alcohol use. She reports previous drug use. Drug: "Crack" cocaine.  Additional Social History:  Alcohol / Drug Use Pain Medications: See MAR Prescriptions: See MAR Over the Counter: See MAR History of alcohol / drug use?: Yes Substance #1 Name of Substance 1: Cocaine Substance #2 Name of Substance 2: Alcohol  CIWA: CIWA-Ar BP: (!) 171/108 Pulse Rate: (!) 118 COWS:    Allergies:  Allergies  Allergen Reactions  . Ivp Dye [Iodinated Diagnostic Agents] Itching    Patient stated she had itching on her back after IV injection    Home Medications: (Not in a hospital admission)   OB/GYN Status:  Patient's last menstrual period was 07/22/2016 (approximate).  General Assessment Data Location of Assessment: [facility] ED TTS Assessment: In system Is this a Tele or Face-to-Face Assessment?: Face-to-Face Is this an Initial Assessment or a Re-assessment for this encounter?: Initial Assessment Patient Accompanied by:: N/A Language Other than English: No Living Arrangements: Other (Comment) What gender do you identify as?: Female Marital status: Single Pregnancy Status: No Living  Arrangements: Other (Comment) [employer]) Can pt return to current living arrangement?: Yes Admission Status: Voluntary Is patient capable of signing voluntary admission?: Yes Referral Source: Self/Family/Friend Insurance type: None  Medical Screening Exam ([place]) Medical Exam completed: Yes  Crisis Care Plan Living Arrangements: Other (Comment) Designer, industrial/product) Legal Guardian: Other: (Self) Name of  Psychiatrist: None Name of Therapist: None  Education Status Is patient currently in school?: No Is the patient employed, unemployed or receiving disability?: Unemployed  Risk to self with the past 6 months Suicidal Ideation: Yes-Currently Present Has patient been a risk to self within the past 6 months prior to admission? : Yes Suicidal Intent: No Has patient had any suicidal intent within the past 6 months prior to admission? : No Is patient at risk for suicide?: No Suicidal Plan?: No Has patient had any suicidal plan within the past 6 months prior to admission? : No Access to Means: No What has been your use of drugs/alcohol within the last 12 months?: Cocaine, Alcohol Previous Attempts/Gestures: Yes How many times?: 1 Other Self Harm Risks: None Triggers for Past Attempts: None known Intentional Self Injurious Behavior: None Family Suicide History: Unknown Recent stressful life event(s): Other (Comment), Financial Problems (Recent Relapse) Persecutory voices/beliefs?: No Depression: Yes Depression Symptoms: Isolating, Tearfulness, Loss of interest in usual pleasures, Feeling worthless/self pity Substance abuse history and/or treatment for substance abuse?: Yes Suicide prevention information given to non-admitted patients: Not applicable  Risk to Others within the past 6 months Homicidal Ideation: No Does patient have any lifetime risk of violence toward others beyond the six months prior to admission? : No Thoughts of Harm to Others: No Current Homicidal Intent: No Current Homicidal Plan: No Access to Homicidal Means: No Identified Victim: None History of harm to others?: No Assessment of Violence: None Noted Violent Behavior Description: None Does patient have access to weapons?: No Criminal Charges Pending?: No Does patient have a court date: No Is patient on probation?: No  Psychosis Hallucinations: None noted Delusions: None noted  Mental Status  Report Appearance/Hygiene: In scrubs Eye Contact: Fair Motor Activity: Freedom of movement Speech: Logical/coherent Level of Consciousness: Alert Mood: Anxious, Depressed, Sad Affect: Appropriate to circumstance Anxiety Level: Moderate Thought Processes: Coherent Judgement: Unimpaired Orientation: Person, Place, Time, Situation, Appropriate for developmental age Obsessive Compulsive Thoughts/Behaviors: None  Cognitive Functioning Concentration: Normal Memory: Recent Intact, Remote Intact Is patient IDD: No Insight: Good Impulse Control: Good Appetite: Poor Have you had any weight changes? : No Change Sleep: Decreased Total Hours of Sleep: 0 Vegetative Symptoms: None  ADLScreening [facility] Assessment Services) Patient's cognitive ability adequate to safely complete daily activities?: Yes Patient able to express need for assistance with ADLs?: Yes Independently performs ADLs?: Yes (appropriate for developmental age)  Prior Inpatient Therapy Prior Inpatient Therapy: Yes Prior Therapy Dates: 03/2019 Prior Therapy Facilty/Provider(s): [facility] Reason for Treatment: Depression, Substance Abuse  Prior Outpatient Therapy Prior Outpatient Therapy: No Does patient have an ACCT team?: No Does patient have Intensive In-House Services?  : No Does patient have [facility] services? : No Does patient have P4CC services?: No  ADL Screening (condition at time of admission) Patient's cognitive ability adequate to safely complete daily activities?: Yes Is the patient deaf or have difficulty hearing?: No Does the patient have difficulty seeing, even when wearing glasses/contacts?: No Does the patient have difficulty concentrating, remembering, or making decisions?: No Patient able to express need for assistance with ADLs?: Yes Does the patient have difficulty dressing or bathing?: No Independently  performs ADLs?: Yes (appropriate for developmental age) Does the patient have difficulty walking  or climbing stairs?: No Weakness of Legs: None Weakness of Arms/Hands: None  Home Assistive Devices/Equipment Home Assistive Devices/Equipment: None  Therapy Consults (therapy consults require a physician order) PT Evaluation Needed: No OT Evalulation Needed: No SLP Evaluation Needed: No Abuse/Neglect Assessment (Assessment to be complete while patient is alone) Abuse/Neglect Assessment Can Be Completed: Yes Physical Abuse: Denies Verbal Abuse: Denies Sexual Abuse: Denies Exploitation of patient/patient's resources: Denies Self-Neglect: Denies Values / Beliefs Cultural Requests During Hospitalization: None Spiritual Requests During Hospitalization: None Consults Spiritual Care Consult Needed: No Transition of Care Team Consult Needed: No            Disposition: Per Psyc NP Ysidro Evert, patient is recommended for Inpatient Hospitalization Disposition Initial Assessment Completed for this Encounter: Yes  On Site Evaluation by:   Reviewed with Physician:    Leonie Douglas MS [place] 10/12/2019 10:29 PM

## 2019-10-12 NOTE — ED Notes (Signed)
Report to include Situation, Background, Assessment, and Recommendations received from [facility]. Patient alert and oriented, warm and dry, in no acute distress. Patient reported SI without a plan. Denied HI, AVH and pain. Patient made aware of Q15 minute rounds and security cameras for their safety. Patient instructed to come to me with needs or concerns.

## 2019-10-12 NOTE — ED Triage Notes (Signed)
Pt states she has been depressed for some time, ran out of cymbalta and trazodone recently and also relapsed with drinking. States has also been using crack, states has had suicidal thoughts today, no plan.

## 2019-10-12 NOTE — Consult Note (Signed)
[facility] Face-to-Face Psychiatry Consult   Reason for Consult: Suicidal Referring Physician: Dr. Cinda Quest Patient Identification: Julie Lambert MRN:  [id number] Principal Diagnosis: <principal problem not specified> Diagnosis:  Active Problems:   Severe alcohol use disorder (HCC)   Cocaine use disorder, moderate, dependence (HCC)   Bipolar I disorder, most recent episode depressed, severe without psychotic features ([place])   Tobacco use disorder   Suicidal ideation   Major depression, recurrent ([place])   Cocaine abuse with cocaine-induced mood disorder ([place])   Bipolar 1 disorder, mixed ([place])   Bipolar 1 disorder, mixed, severe ([place])   Total Time spent with patient: 45 minutes  Subjective: " I have been overwhelmed.  I worked 2 jobs and take care of my mother who is in a rest home." Julie Lambert is a 54 y.o. female patient presented to [facility] ED voluntarily for complaints of depression and substance abuse. Per the ED triage nursing note, the patient states she has been depressed for some time, ran out of her Cymbalta and trazodone recently, and has relapsed with drinking. The patient states she has also been using crack, states have had suicidal thoughts today, no plan.  The patient was seen face-to-face by this provider; the chart was reviewed and consulted with Dr. Cinda Quest on 10/13/2019 due to the patient's care. It was discussed with the EDP the patient meets the criteria to be admitted to the psychiatric inpatient unit.  On evaluation, the patient report that she is presenting to the ER because "I've been drinking for two days straight." She voiced that she had not had anything to eat for two days. The patient reported that she recently completed a substance abuse program with [facility] and now lives in a boarding house. "I've been depressed too for a long time; I'm a little overwhelmed, and some days I don't want to be here anymore." Patient-reported, "I'm overwhelmed with work, and my  mother lives in an assisted living facility." Patient-reported being clean from Cocaine and alcohol for nine months and recently relapsed.  The patient is alert and oriented x 4, anxious but cooperative, and mood-congruent with affect. Initially, she voiced, "do I have to talk to your tonight?" She continues to express that she did not want to talk to anyone. The patient eventually agreed to be assessed. The patient does not appear to be responding to internal or external stimuli. Neither is the patient presenting with any delusional thinking. The patient denies auditory or visual hallucinations. "I only hear voices when I am getting high." The patient admits suicidal ideations without a plan. She denies homicidal or self-harm ideations. The patient is not presenting with any psychotic or paranoid behaviors. During an encounter with the patient, she was able to answer questions appropriately. Plan: The patient is a safety risk and requires psychiatric inpatient admission for stabilization and treatment. HPI: Per Dr. Cinda Quest: Julie Lambert is a 54 y.o. female who is feeling depressed and suicidal.  She ran out of her medications and is now read starting to drink.  She told the nurse she is also using crack.  She did not have a plan when we talked to her.  Past Psychiatric History: Bipolar 2 disorder ([place]) Substance abuse ([place])  Risk to Self: Suicidal Ideation: Yes-Currently Present Suicidal Intent: No Is patient at risk for suicide?: No Suicidal Plan?: No Access to Means: No What has been your use of drugs/alcohol within the last 12 months?: Cocaine, Alcohol How many times?: 1 Other Self Harm Risks: None  Triggers for Past Attempts: None known Intentional Self Injurious Behavior: None Risk to Others: Homicidal Ideation: No Thoughts of Harm to Others: No Current Homicidal Intent: No Current Homicidal Plan: No Access to Homicidal Means: No Identified Victim: None History of harm to  others?: No Assessment of Violence: None Noted Violent Behavior Description: None Does patient have access to weapons?: No Criminal Charges Pending?: No Does patient have a court date: No Prior Inpatient Therapy: Prior Inpatient Therapy: Yes Prior Therapy Dates: 03/2019 Prior Therapy Facilty/Provider(s): [facility] Reason for Treatment: Depression, Substance Abuse Prior Outpatient Therapy: Prior Outpatient Therapy: No Does patient have an ACCT team?: No Does patient have Intensive In-House Services?  : No Does patient have [facility] services? : No Does patient have P4CC services?: No  Past Medical History:  Past Medical History:  Diagnosis Date  . Bipolar 2 disorder ([place])   . Diabetes mellitus without complication ([place])   . Diverticulitis   . Diverticulosis   . GERD (gastroesophageal reflux disease)   . Sciatica   . Substance abuse ([place])   . Uterine fibroid     Past Surgical History:  Procedure Laterality Date  . BACK SURGERY    . CHOLECYSTECTOMY    . LUMBAR SPINE SURGERY     Family History:  Family History  Problem Relation Age of Onset  . CAD Mother   . Diabetes Mother   . Heart disease Mother   . Parkinson's disease Mother   . Kidney disease Mother   . Cancer Father        prostate cancer  . Breast cancer Maternal Aunt   . Breast cancer Maternal Grandmother    Family Psychiatric  History:  Social History:  Social History   Substance and Sexual Activity  Alcohol Use Yes   Comment: a few times month/year     Social History   Substance and Sexual Activity  Drug Use Not Currently  . Types: "Crack" cocaine   Comment: last use 7/22, smoked crack    Social History   Socioeconomic History  . Marital status: Divorced    Spouse name: Not on file  . Number of children: Not on file  . Years of education: Not on file  . Highest education level: Not on file  Occupational History  . Not on file  Tobacco Use  . Smoking status: Current Every Day Smoker     Packs/day: 0.25    Years: 40.00    Pack years: 10.00    Types: Cigarettes  . Smokeless tobacco: Never Used  Vaping Use  . Vaping Use: Never used  Substance and Sexual Activity  . Alcohol use: Yes    Comment: a few times month/year  . Drug use: Not Currently    Types: "Crack" cocaine    Comment: last use 7/22, smoked crack  . Sexual activity: Yes    Partners: Male    Birth control/protection: Condom, Post-menopausal  Other Topics Concern  . Not on file  Social History Narrative  . Not on file   Social Determinants of Health   Financial Resource Strain:   . Difficulty of Paying Living Expenses: Not on file  Food Insecurity:   . Worried About Charity fundraiser in the Last Year: Not on file  . Ran Out of Food in the Last Year: Not on file  Transportation Needs:   . Lack of Transportation (Medical): Not on file  . Lack of Transportation (Non-Medical): Not on file  Physical Activity:   .  Days of Exercise per Week: Not on file  . Minutes of Exercise per Session: Not on file  Stress:   . Feeling of Stress : Not on file  Social Connections:   . Frequency of Communication with Friends and Family: Not on file  . Frequency of Social Gatherings with Friends and Family: Not on file  . Attends Religious Services: Not on file  . Active Member of Clubs or Organizations: Not on file  . Attends Archivist Meetings: Not on file  . Marital Status: Not on file   Additional Social History:    Allergies:   Allergies  Allergen Reactions  . Ivp Dye [Iodinated Diagnostic Agents] Itching    Patient stated she had itching on her back after IV injection    Labs:  Results for orders placed or performed during the hospital encounter of 10/12/19 (from the past 48 hour(s))  CBC     Status: Abnormal   Collection Time: 10/12/19  8:00 PM  Result Value Ref Range   WBC 15.8 (H) 4.0 - 10.5 K/uL   RBC 5.04 3.87 - 5.11 MIL/uL   Hemoglobin 15.3 (H) 12.0 - 15.0 g/dL   HCT 42.5 36 - 46 %    MCV 84.3 80.0 - 100.0 fL   MCH 30.4 26.0 - 34.0 pg   MCHC 36.0 30.0 - 36.0 g/dL   RDW 13.0 11.5 - 15.5 %   Platelets 303 150 - 400 K/uL   nRBC 0.0 0.0 - 0.2 %    Comment: Performed at [facility], [address] [place], [place] [address]  Comprehensive metabolic panel     Status: Abnormal   Collection Time: 10/12/19  8:00 PM  Result Value Ref Range   Sodium 137 135 - 145 mmol/L   Potassium 3.8 3.5 - 5.1 mmol/L   Chloride 104 98 - 111 mmol/L   CO2 17 (L) 22 - 32 mmol/L   Glucose, Bld 233 (H) 70 - 99 mg/dL    Comment: Glucose reference range applies only to samples taken after fasting for at least 8 hours.   BUN 8 6 - 20 mg/dL   Creatinine, Ser 0.65 0.44 - 1.00 mg/dL   Calcium 9.6 8.9 - 10.3 mg/dL   Total Protein 8.3 (H) 6.5 - 8.1 g/dL   Albumin 4.5 3.5 - 5.0 g/dL   AST 17 15 - 41 U/L   ALT 18 0 - 44 U/L   Alkaline Phosphatase 116 38 - 126 U/L   Total Bilirubin 0.9 0.3 - 1.2 mg/dL   GFR calc non Af Amer >60 >60 mL/min   GFR calc Af Amer >60 >60 mL/min   Anion gap 16 (H) 5 - 15    Comment: Performed at [facility], [place], [place] [address]  Troponin I (High Sensitivity)     Status: None   Collection Time: 10/12/19  8:00 PM  Result Value Ref Range   Troponin I (High Sensitivity) 14 <18 ng/L    Comment: (NOTE) Elevated high sensitivity troponin I (hsTnI) values and significant  changes across serial measurements may suggest ACS but many other  chronic and acute conditions are known to elevate hsTnI results.  Refer to the "Links" section for chest pain algorithms and additional  guidance. Performed at [facility], [address], [place], [place] [address]   Ethanol     Status: Abnormal   Collection Time: 10/12/19  8:00 PM  Result Value Ref Range   Alcohol,  Ethyl (B) 30 (H) <10 mg/dL    Comment: (NOTE) Lowest detectable limit for serum alcohol is 10 mg/dL.  For medical purposes only. Performed at [facility], [address] [place], [place] [address]   Urinalysis, Complete w Microscopic     Status: Abnormal   Collection Time: 10/12/19  8:00 PM  Result Value Ref Range   Color, Urine STRAW (A) YELLOW   APPearance CLEAR (A) CLEAR   Specific Gravity, Urine 1.005 1.005 - 1.030   pH 5.0 5.0 - 8.0   Glucose, UA >=500 (A) NEGATIVE mg/dL   Hgb urine dipstick NEGATIVE NEGATIVE   Bilirubin Urine NEGATIVE NEGATIVE   Ketones, ur 5 (A) NEGATIVE mg/dL   Protein, ur NEGATIVE NEGATIVE mg/dL   Nitrite NEGATIVE NEGATIVE   Leukocytes,Ua TRACE (A) NEGATIVE   RBC / HPF 0-5 0 - 5 RBC/hpf   WBC, UA 0-5 0 - 5 WBC/hpf   Bacteria, UA MANY (A) NONE SEEN   Squamous Epithelial / LPF 0-5 0 - 5   Mucus PRESENT     Comment: Performed at [facility], [address], [place], [place] [address]  Urine Drug Screen, Qualitative ([facility] only)     Status: Abnormal   Collection Time: 10/12/19  8:00 PM  Result Value Ref Range   Tricyclic, Ur Screen NONE DETECTED NONE DETECTED   Amphetamines, Ur Screen NONE DETECTED NONE DETECTED   MDMA (Ecstasy)Ur Screen NONE DETECTED NONE DETECTED   Cocaine Metabolite,Ur [place] POSITIVE (A) NONE DETECTED   Opiate, Ur Screen NONE DETECTED NONE DETECTED   Phencyclidine (PCP) Ur S NONE DETECTED NONE DETECTED   Cannabinoid 50 Ng, Ur [place] NONE DETECTED NONE DETECTED   Barbiturates, Ur Screen NONE DETECTED NONE DETECTED   Benzodiazepine, Ur Scrn NONE DETECTED NONE DETECTED   Methadone Scn, Ur NONE DETECTED NONE DETECTED    Comment: (NOTE) Tricyclics + metabolites, urine    Cutoff 1000 ng/mL Amphetamines + metabolites, urine  Cutoff 1000 ng/mL MDMA (Ecstasy), urine              Cutoff 500 ng/mL Cocaine Metabolite, urine          Cutoff 300 ng/mL Opiate + metabolites, urine        Cutoff 300 ng/mL Phencyclidine (PCP), urine         Cutoff 25 ng/mL Cannabinoid, urine                 Cutoff 50 ng/mL Barbiturates + metabolites, urine  Cutoff 200 ng/mL Benzodiazepine, urine               Cutoff 200 ng/mL Methadone, urine                   Cutoff 300 ng/mL  The urine drug screen provides only a preliminary, unconfirmed analytical test result and should not be used for non-medical purposes. Clinical consideration and professional judgment should be applied to any positive drug screen result due to possible interfering substances. A more specific alternate chemical method must be used in order to obtain a confirmed analytical result. Gas chromatography / mass spectrometry (GC/MS) is the preferred confirm atory method. Performed at [facility], [address] [place], [place] [address]     Current Facility-Administered Medications  Medication Dose Route Frequency Provider Last Rate Last Admin  . cephALEXin (KEFLEX) capsule 500 mg  500 mg Oral Q8H Nena Polio, MD   500 mg at 10/12/19 2256  . [START ON 10/13/2019] citalopram (CELEXA)  tablet 20 mg  20 mg Oral Daily Nena Polio, MD      . cloNIDine (CATAPRES) tablet 0.1 mg  0.1 mg Oral QID Caroline Sauger, NP   0.1 mg at 10/12/19 2253   Followed by  . [START ON 10/15/2019] cloNIDine (CATAPRES) tablet 0.1 mg  0.1 mg Oral Valentina Gu, NP       Followed by  . [START ON 10/18/2019] cloNIDine (CATAPRES) tablet 0.1 mg  0.1 mg Oral QAC breakfast Caroline Sauger, NP      . dicyclomine (BENTYL) tablet 20 mg  20 mg Oral Q6H PRN Caroline Sauger, NP      . gabapentin (NEURONTIN) capsule 400 mg  400 mg Oral TID Nena Polio, MD   400 mg at 10/12/19 2255  . hydrOXYzine (ATARAX/VISTARIL) tablet 25 mg  25 mg Oral Q6H PRN Caroline Sauger, NP      . hydrOXYzine (ATARAX/VISTARIL) tablet 25 mg  25 mg Oral TID Nena Polio, MD   25 mg at 10/12/19 2253  . insulin glargine (LANTUS) injection 20 Units  20 Units Subcutaneous QHS Nena Polio, MD      . loperamide (IMODIUM) capsule 2-4 mg  2-4 mg Oral PRN Caroline Sauger, NP      . Derrill Memo ON 10/13/2019] metFORMIN (GLUCOPHAGE) tablet 1,000  mg  1,000 mg Oral BID WC Nena Polio, MD      . methocarbamol (ROBAXIN) tablet 500 mg  500 mg Oral Q8H PRN Caroline Sauger, NP      . ondansetron (ZOFRAN-ODT) disintegrating tablet 4 mg  4 mg Oral Q6H PRN Caroline Sauger, NP      . Derrill Memo ON 10/13/2019] pantoprazole (PROTONIX) EC tablet 40 mg  40 mg Oral Daily Conni Slipper F, MD      . sodium chloride 0.9 % 1,000 mL with thiamine 737 mg, folic acid 1 mg, multivitamins adult 10 mL infusion   Intravenous Once Nena Polio, MD   Held at 10/12/19 2204  . traZODone (DESYREL) tablet 150 mg  150 mg Oral QHS Nena Polio, MD   150 mg at 10/12/19 2253   Current Outpatient Medications  Medication Sig Dispense Refill  . atorvastatin (LIPITOR) 10 MG tablet Take 1 tablet (10 mg total) by mouth daily at 6 PM. 30 tablet 0  . citalopram (CELEXA) 20 MG tablet Take 1 tablet by mouth daily.    . DULoxetine (CYMBALTA) 60 MG capsule Take 1 capsule (60 mg total) by mouth 2 (two) times daily. 60 capsule 0  . gabapentin (NEURONTIN) 400 MG capsule Take 1 capsule (400 mg total) by mouth 3 (three) times daily. 90 capsule 0  . hydrOXYzine (ATARAX/VISTARIL) 25 MG tablet Take 1 tablet (25 mg total) by mouth 3 (three) times daily. 30 tablet 1  . insulin glargine (LANTUS) 100 UNIT/ML injection Inject 0.2 mLs (20 Units total) into the skin at bedtime. 10 mL 1  . metFORMIN (GLUCOPHAGE) 1000 MG tablet Take 1 tablet (1,000 mg total) by mouth 2 (two) times daily with a meal. 60 tablet 0  . pantoprazole (PROTONIX) 40 MG tablet Take 1 tablet (40 mg total) by mouth daily. 30 tablet 0  . traZODone (DESYREL) 150 MG tablet Take 1 tablet (150 mg total) by mouth at bedtime. 30 tablet 0    Musculoskeletal: Strength & Muscle Tone: within normal limits Gait & Station: normal Patient leans: N/A  Psychiatric Specialty Exam: Physical Exam Vitals and nursing note reviewed.  Constitutional:  Appearance: She is normal weight.  HENT:     Nose: Nose normal.      Mouth/Throat:     Mouth: Mucous membranes are moist.  Cardiovascular:     Rate and Rhythm: Normal rate.  Pulmonary:     Effort: Pulmonary effort is normal.  Musculoskeletal:     Cervical back: Normal range of motion and neck supple.  Neurological:     Mental Status: She is alert.  Psychiatric:        Attention and Perception: Attention and perception normal.        Mood and Affect: Mood is anxious and depressed. Affect is tearful and inappropriate.        Speech: Speech is tangential.        Behavior: Behavior is agitated and withdrawn. Behavior is cooperative.        Thought Content: Thought content includes suicidal ideation.        Cognition and Memory: Cognition normal.        Judgment: Judgment is impulsive.     Review of Systems  Blood pressure (!) 171/108, pulse (!) 118, temperature 98.5 F (36.9 C), temperature source Oral, resp. rate 20, height 5\' 7"  (1.702 m), weight 81.6 kg, last menstrual period 07/22/2016, SpO2 95 %.Body mass index is 28.19 kg/m.  General Appearance: Guarded  Eye Contact:  Good  Speech:  Clear and Coherent  Volume:  Normal  Mood:  Anxious, Depressed, Hopeless, Irritable and Worthless  Affect:  Congruent, Depressed, Flat and Inappropriate  Thought Process:  Coherent  Orientation:  Full (Time, Place, and Person)  Thought Content:  Logical  Suicidal Thoughts:  Yes.  without intent/plan  Homicidal Thoughts:  No  Memory:  Immediate;   Good Recent;   Good Remote;   Good  Judgement:  Poor  Insight:  Lacking  Psychomotor Activity:  Normal  Concentration:  Concentration: Fair and Attention Span: Good  Recall:  Good  Fund of Knowledge:  Good  Language:  Good  Akathisia:  Negative  Handed:  Right  AIMS (if indicated):     Assets:  Communication Skills Desire for Improvement Financial Resources/Insurance Physical Health Social Support  ADL's:  Intact  Cognition:  WNL  Sleep:    Insomnia     Treatment Plan Summary: Medication management and  Plan Patient meets criteria for psychiatric inpatient admission.  Re-start the patient on all of her home medications  Disposition: Recommend psychiatric Inpatient admission when medically cleared. Supportive therapy provided about ongoing stressors.  Caroline Sauger, NP 10/12/2019 11:33 PM
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
## 2019-10-13 ENCOUNTER — Inpatient Hospital Stay: Admission: AD | Admit: 2019-10-13 | Payer: Self-pay | Source: Intra-hospital | Admitting: Psychiatry

## 2019-10-13 DIAGNOSIS — F1414 Cocaine abuse with cocaine-induced mood disorder: Secondary | ICD-10-CM

## 2019-10-13 DIAGNOSIS — F3163 Bipolar disorder, current episode mixed, severe, without psychotic features: Secondary | ICD-10-CM

## 2019-10-13 LAB — GLUCOSE, CAPILLARY
Glucose-Capillary: 290 mg/dL — ABNORMAL HIGH (ref 70–99)
Glucose-Capillary: 225 mg/dL — ABNORMAL HIGH (ref 70–99)
Glucose-Capillary: 300 mg/dL — ABNORMAL HIGH (ref 70–99)
Glucose-Capillary: 310 mg/dL — ABNORMAL HIGH (ref 70–99)

## 2019-10-13 LAB — SARS CORONAVIRUS 2 BY RT PCR (HOSPITAL ORDER, PERFORMED IN ~~LOC~~ HOSPITAL LAB): SARS Coronavirus 2: NEGATIVE

## 2019-10-13 MED ORDER — IBUPROFEN 600 MG PO TABS
600.0000 mg | ORAL_TABLET | Freq: Four times a day (QID) | ORAL | Status: DC | PRN
  Administered 2019-10-13: 600 mg via ORAL
  Filled 2019-10-13: qty 1

## 2019-10-13 MED ORDER — BENZOCAINE 10 % MT GEL
Freq: Two times a day (BID) | OROMUCOSAL | Status: DC | PRN
  Filled 2019-10-13 (×3): qty 9

## 2019-10-13 MED ORDER — CEPHALEXIN 500 MG PO CAPS: 500.0000 mg | ORAL_CAPSULE | Freq: Three times a day (TID) | ORAL | 0 refills | Status: AC

## 2019-10-13 NOTE — ED Notes (Signed)
Hourly rounding reveals patient in room. No complaints, stable, in no acute distress. Q15 minute rounds and monitoring via Security Cameras to continue. 

## 2019-10-13 NOTE — Discharge Instructions (Signed)
Please follow-up with [facility] for your outpatient treatment.  Please be sure to take all your medicines.  Please return for any further problems at all.  I will give you a prescription for the Keflex 1 3 times a day for the UTI.

## 2019-10-13 NOTE — BH Assessment (Signed)
PATIENT BED AVAILABLE PENDING NEGATIVE COVID RESULTS AND AFTER 9:30AM  Patient is to be admitted to [facility] by Psychiatric Nurse Practitioner Caroline Sauger.  Attending Physician will be Dr. Weber Cooks.   Patient has been assigned to room 309, by [facility] Charge Nurse [place].    ER staff is aware of the admission:  [place] ER Secretary    Dr. Owens Shark, ER MD   [facility] Patient's Nurse   Helene Kelp Patient Access.

## 2019-10-13 NOTE — Consult Note (Signed)
[facility] Psych ED Discharge  10/13/2019 9:50 PM Julie Lambert  MRN:  [id number] Principal Problem: Cocaine abuse with cocaine-induced mood disorder [facility]) Discharge Diagnoses: Principal Problem:   Cocaine abuse with cocaine-induced mood disorder (HCC) Active Problems:   Severe alcohol use disorder (HCC)   Cocaine use disorder, moderate, dependence (HCC)   Tobacco use disorder   Suicidal ideation   Major depression, recurrent (HCC)   Bipolar 1 disorder, mixed (HCC)   Bipolar 1 disorder, mixed, severe ([place])  Subjective: "I'm ok."  Patient seen and evaluated in person by this provider in [facility] specialist.  She reports that she feels okay now that she has a lot going on.  Often times she feels overwhelmed with working and taking care of her mother and her grandchildren.  She often finds herself doing too much and becoming overwhelmed.  This is when she relapses on cocaine which makes her feel bad about herself.  She denies suicidal ideations at this time along with homicidal ideations, mania, withdrawal symptoms.  She would like to go to out patient provider to help prevent relapse and to help with her stress.  Psychiatrically stable for discharge.  Total Time spent with patient: 45 minutes  Past Psychiatric History: bipolar d/o, cocaine use d/o  Past Medical History:  Past Medical History:  Diagnosis Date  . Bipolar 2 disorder ([place])   . Diabetes mellitus without complication ([place])   . Diverticulitis   . Diverticulosis   . GERD (gastroesophageal reflux disease)   . Sciatica   . Substance abuse ([place])   . Uterine fibroid     Past Surgical History:  Procedure Laterality Date  . BACK SURGERY    . CHOLECYSTECTOMY    . LUMBAR SPINE SURGERY     Family History:  Family History  Problem Relation Age of Onset  . CAD Mother   . Diabetes Mother   . Heart disease Mother   . Parkinson's disease Mother   . Kidney disease Mother   . Cancer Father        prostate cancer  . Breast cancer  Maternal Aunt   . Breast cancer Maternal Grandmother    Family Psychiatric  History: none Social History:  Social History   Substance and Sexual Activity  Alcohol Use Yes   Comment: a few times month/year     Social History   Substance and Sexual Activity  Drug Use Not Currently  . Types: "Crack" cocaine   Comment: last use 7/22, smoked crack    Social History   Socioeconomic History  . Marital status: Divorced    Spouse name: Not on file  . Number of children: Not on file  . Years of education: Not on file  . Highest education level: Not on file  Occupational History  . Not on file  Tobacco Use  . Smoking status: Current Every Day Smoker    Packs/day: 0.25    Years: 40.00    Pack years: 10.00    Types: Cigarettes  . Smokeless tobacco: Never Used  Vaping Use  . Vaping Use: Never used  Substance and Sexual Activity  . Alcohol use: Yes    Comment: a few times month/year  . Drug use: Not Currently    Types: "Crack" cocaine    Comment: last use 7/22, smoked crack  . Sexual activity: Yes    Partners: Male    Birth control/protection: Condom, Post-menopausal  Other Topics Concern  . Not on file  Social History Narrative  .  Not on file   Social Determinants of Health   Financial Resource Strain:   . Difficulty of Paying Living Expenses: Not on file  Food Insecurity:   . Worried About Charity fundraiser in the Last Year: Not on file  . Ran Out of Food in the Last Year: Not on file  Transportation Needs:   . Lack of Transportation (Medical): Not on file  . Lack of Transportation (Non-Medical): Not on file  Physical Activity:   . Days of Exercise per Week: Not on file  . Minutes of Exercise per Session: Not on file  Stress:   . Feeling of Stress : Not on file  Social Connections:   . Frequency of Communication with Friends and Family: Not on file  . Frequency of Social Gatherings with Friends and Family: Not on file  . Attends Religious Services: Not on  file  . Active Member of Clubs or Organizations: Not on file  . Attends Archivist Meetings: Not on file  . Marital Status: Not on file    Has this patient used any form of tobacco in the last 30 days? (Cigarettes, Smokeless Tobacco, Cigars, and/or Pipes) A prescription for an FDA-approved tobacco cessation medication was offered at discharge and the patient refused  Current Medications: Current Facility-Administered Medications  Medication Dose Route Frequency Provider Last Rate Last Admin  . benzocaine (ORAJEL) 10 % mucosal gel   Mouth/Throat BID PRN Harvest Dark, MD   Given at 10/13/19 1643  . cephALEXin (KEFLEX) capsule 500 mg  500 mg Oral Q8H Nena Polio, MD   500 mg at 10/13/19 1643  . citalopram (CELEXA) tablet 20 mg  20 mg Oral Daily Nena Polio, MD      . cloNIDine (CATAPRES) tablet 0.1 mg  0.1 mg Oral QID Caroline Sauger, NP   0.1 mg at 10/13/19 1644   Followed by  . [START ON 10/15/2019] cloNIDine (CATAPRES) tablet 0.1 mg  0.1 mg Oral Valentina Gu, NP       Followed by  . [START ON 10/18/2019] cloNIDine (CATAPRES) tablet 0.1 mg  0.1 mg Oral QAC breakfast Caroline Sauger, NP      . dicyclomine (BENTYL) tablet 20 mg  20 mg Oral Q6H PRN Caroline Sauger, NP      . gabapentin (NEURONTIN) capsule 400 mg  400 mg Oral TID Nena Polio, MD   400 mg at 10/13/19 1643  . hydrOXYzine (ATARAX/VISTARIL) tablet 25 mg  25 mg Oral Q6H PRN Caroline Sauger, NP      . hydrOXYzine (ATARAX/VISTARIL) tablet 25 mg  25 mg Oral TID Nena Polio, MD   25 mg at 10/13/19 1644  . ibuprofen (ADVIL) tablet 600 mg  600 mg Oral Q6H PRN Harvest Dark, MD   600 mg at 10/13/19 1031  . insulin glargine (LANTUS) injection 20 Units  20 Units Subcutaneous QHS Nena Polio, MD   20 Units at 10/13/19 0116  . loperamide (IMODIUM) capsule 2-4 mg  2-4 mg Oral PRN Caroline Sauger, NP      . metFORMIN (GLUCOPHAGE) tablet 1,000 mg  1,000 mg Oral BID WC  Nena Polio, MD   1,000 mg at 10/13/19 1643  . methocarbamol (ROBAXIN) tablet 500 mg  500 mg Oral Q8H PRN Caroline Sauger, NP      . ondansetron (ZOFRAN-ODT) disintegrating tablet 4 mg  4 mg Oral Q6H PRN Caroline Sauger, NP      . pantoprazole (PROTONIX) EC tablet 40  mg  40 mg Oral Daily Nena Polio, MD   40 mg at 10/13/19 0943  . sodium chloride 0.9 % 1,000 mL with thiamine 878 mg, folic acid 1 mg, multivitamins adult 10 mL infusion   Intravenous Once Nena Polio, MD   Held at 10/12/19 2204  . traZODone (DESYREL) tablet 150 mg  150 mg Oral QHS Nena Polio, MD   150 mg at 10/12/19 2253   Current Outpatient Medications  Medication Sig Dispense Refill  . cephALEXin (KEFLEX) 500 MG capsule Take 1 capsule (500 mg total) by mouth 3 (three) times daily for 10 days. 30 capsule 0   PTA Medications: (Not in a hospital admission)   Musculoskeletal: Strength & Muscle Tone: within normal limits Gait & Station: normal Patient leans: N/A  Psychiatric Specialty Exam: Physical Exam Vitals and nursing note reviewed.  Constitutional:      Appearance: Normal appearance.  HENT:     Head: Normocephalic.     Nose: Nose normal.  Pulmonary:     Effort: Pulmonary effort is normal.  Musculoskeletal:     Cervical back: Normal range of motion.  Neurological:     General: No focal deficit present.     Mental Status: She is alert and oriented to person, place, and time.  Psychiatric:        Attention and Perception: Attention and perception normal.        Mood and Affect: Mood is depressed.        Speech: Speech normal.        Behavior: Behavior normal. Behavior is cooperative.        Thought Content: Thought content normal.        Cognition and Memory: Cognition and memory normal.        Judgment: Judgment normal.     Review of Systems  Psychiatric/Behavioral: Positive for dysphoric mood.  All other systems reviewed and are negative.   Blood pressure 107/90, pulse 79,  temperature 98 F (36.7 C), resp. rate 18, height 5\' 7"  (1.702 m), weight 81.6 kg, last menstrual period 07/22/2016, SpO2 99 %.Body mass index is 28.19 kg/m.  General Appearance: Casual  Eye Contact:  Good  Speech:  Normal Rate  Volume:  Normal  Mood:  Depressed  Affect:  Congruent  Thought Process:  Coherent and Descriptions of Associations: Intact  Orientation:  Full (Time, Place, and Person)  Thought Content:  WDL and Logical  Suicidal Thoughts:  No  Homicidal Thoughts:  No  Memory:  Immediate;   Good Recent;   Good Remote;   Good  Judgement:  Fair  Insight:  Good  Psychomotor Activity:  Normal  Concentration:  Concentration: Good and Attention Span: Good  Recall:  Good  Fund of Knowledge:  Good  Language:  Good  Akathisia:  No  Handed:  Right  AIMS (if indicated):     Assets:  Housing Leisure Time Physical Health Resilience Social Support Vocational/Educational  ADL's:  Intact  Cognition:  WNL  Sleep:        Demographic Factors:  Living alone  Loss Factors: NA  Historical Factors: NA  Risk Reduction Factors:   Sense of responsibility to family, Employed and Positive social support  Continued Clinical Symptoms:  Depression, mild  Cognitive Features That Contribute To Risk:  None    Suicide Risk:  Minimal: No identifiable suicidal ideation.  Patients presenting with no risk factors but with morbid ruminations; may be classified as minimal risk based on the severity  of the depressive symptoms  Plan Of Care/Follow-up recommendations:  Cocaine induced mood disorder: -Restarted gabapentin 300 mg TID -Restarted Celexa 20 mg daily -Refrain from alcohol and drug use -Attend 12-step program and obtain a sponsor  Insomnia: Continue Trazodone 150 mg at bedtime Activity:  as tolerated Diet:  heart healthy diet   Disposition: discharge home Waylan Boga, NP 10/13/2019, 9:50 PM

## 2019-10-13 NOTE — BH Assessment (Signed)
Writer spoke with the patient to complete an updated/reassessment. Patient was sitting in bed upon this writer's arrival. Pt reported that she was overwhelmed but feels better. Patient denied SI/HI/AVH. Per Roselyn Reef L. Patient no longer meets inpatient criteria and can be discharged.   Patient was provided with outpatient resources to [facility] outpatient services. Patient agreed to follow up.

## 2019-10-13 NOTE — ED Notes (Signed)
After speaking with patient, contacted Medication Management and was informed they had been trying to contact patient to reverify income and insurance status since 09/11/2019. Recommend appropriate staff assist patient with reverification during hospitalization to ensure medication compliance upon discharge.   ** The above is intended solely for informational and/or communicative purposes. It should in no way be considered an endorsement of any specific treatment, therapy or action. **

## 2019-10-13 NOTE — ED Notes (Signed)
VOLUNTARY, set to be discharged

## 2019-10-13 NOTE — ED Notes (Signed)
VOL/pending inpatient admit when medically cleared.

## 2019-10-13 NOTE — ED Notes (Signed)
VOL  PENDING  PLACEMENT 

## 2019-10-16 IMAGING — US US PELVIS COMPLETE
1 series · 14 of 25 positions shown · non-contrast
Comparison: CT 01/31/2018.  Ultrasound 11/25/2017.

CLINICAL DATA: Lower abdominal pain



[Series 1: us pelvis complete · 0.21mm/px · 14 of 76 slices shown]
[im 1/76]
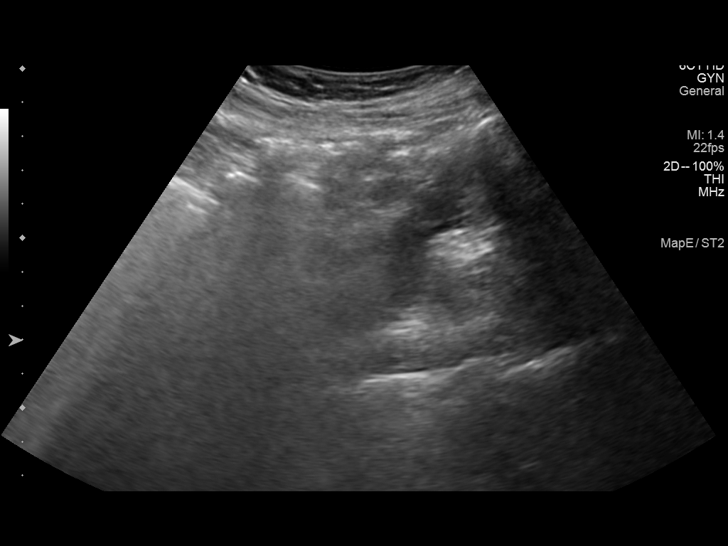
[im 7/76]
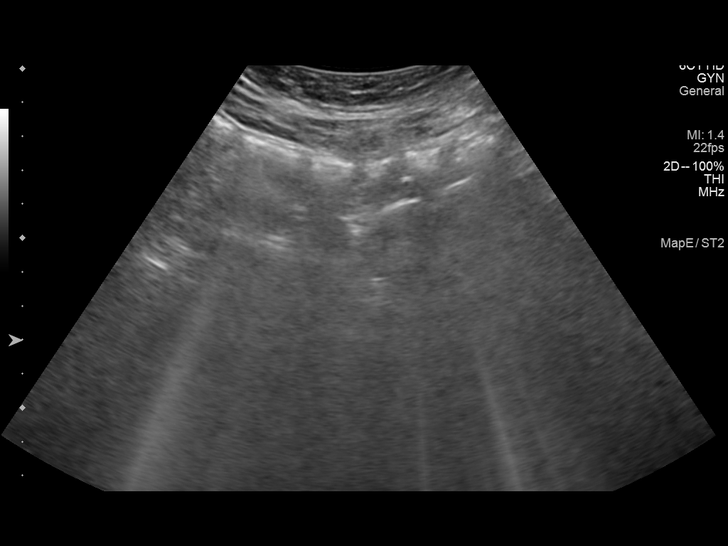
[im 13/76]
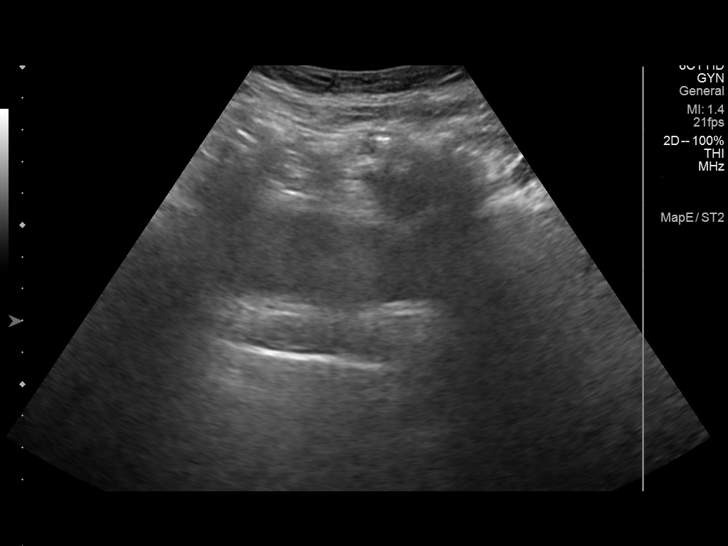
[im 19/76]
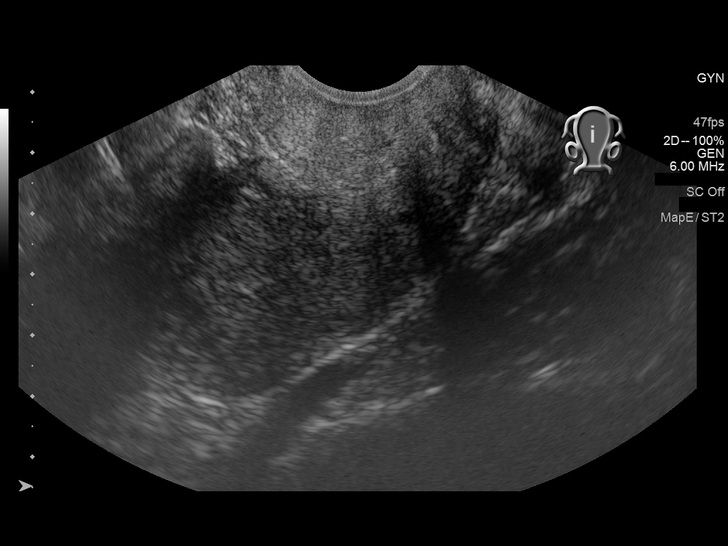
[im 26/76]
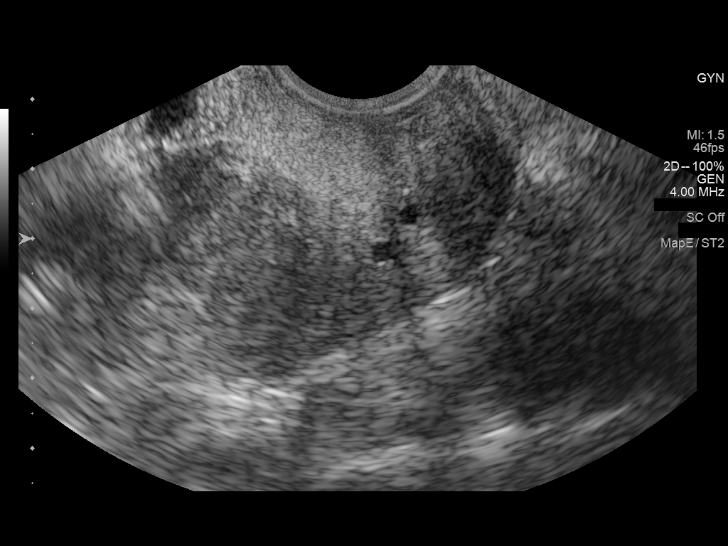
[im 29/76]
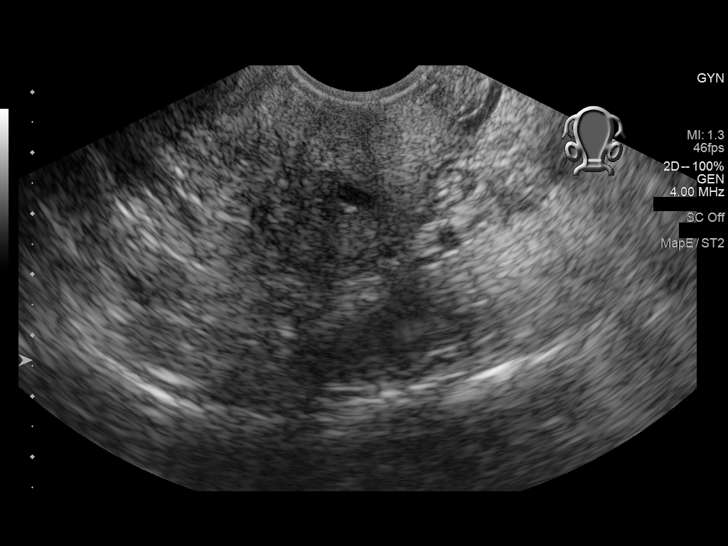
[im 35/76]
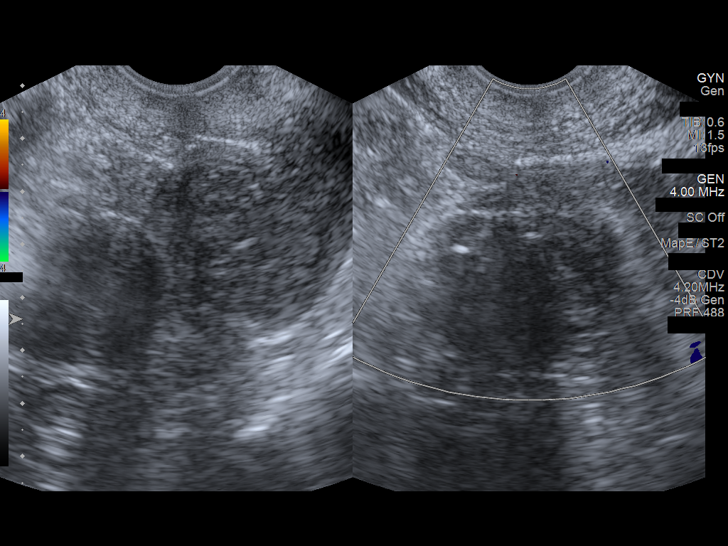
[im 41/76]
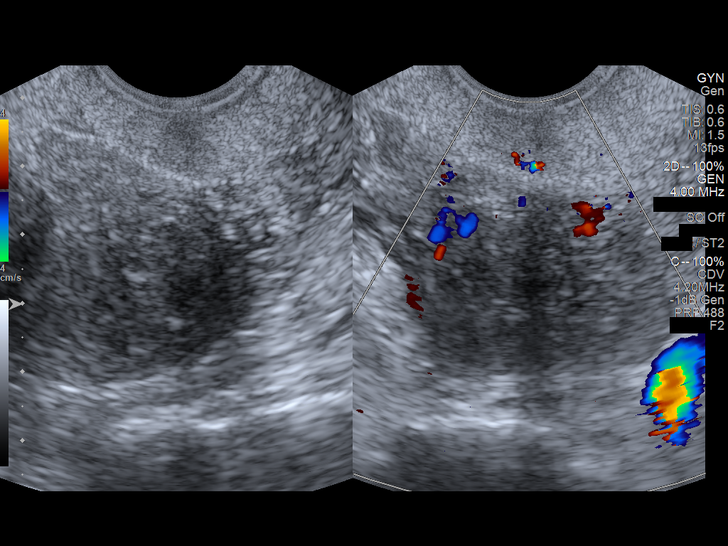
[im 47/76]
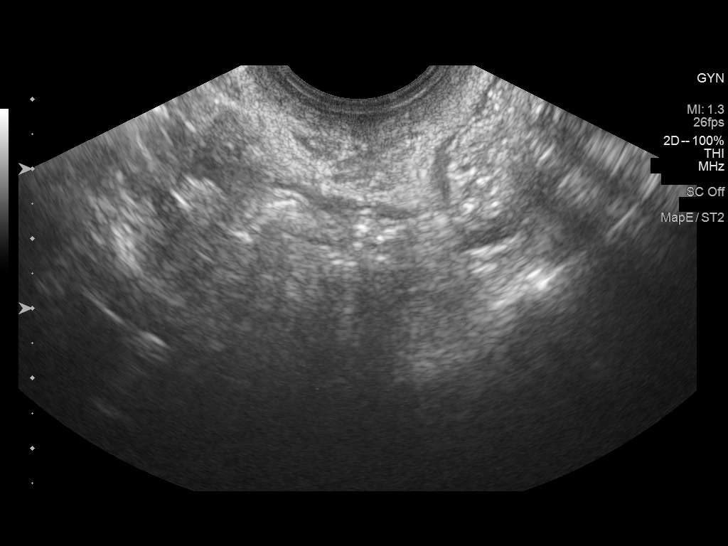
[im 51/76]
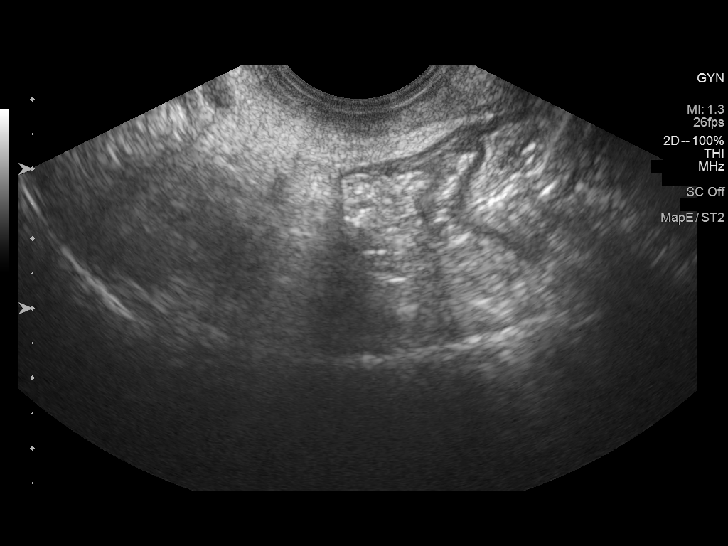
[im 57/76]
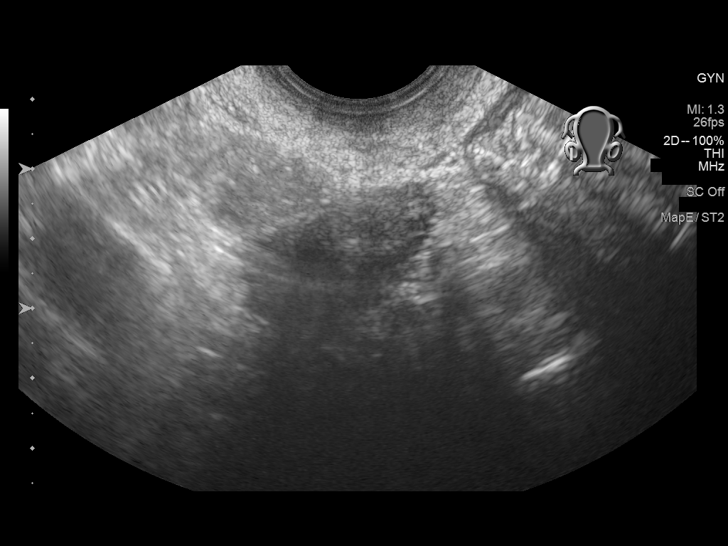
[im 63/76]
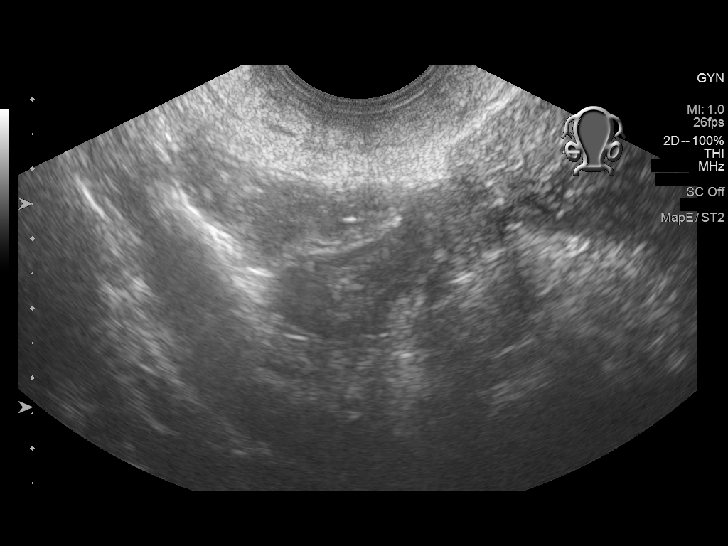
[im 69/76]
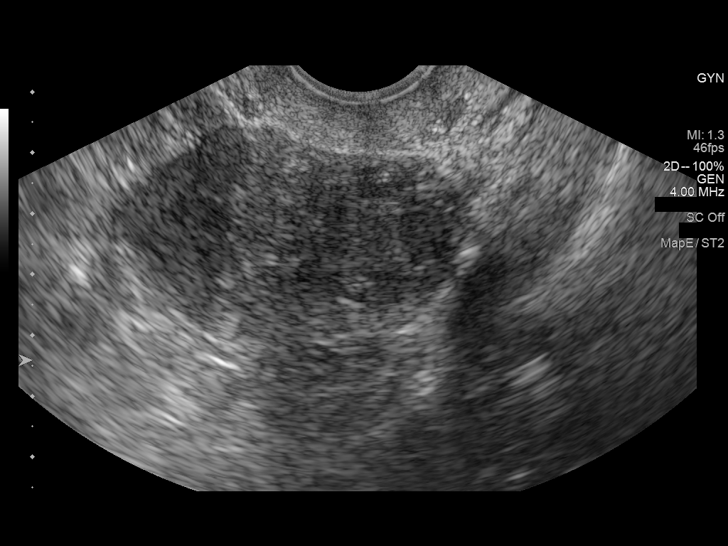
[im 76/76]
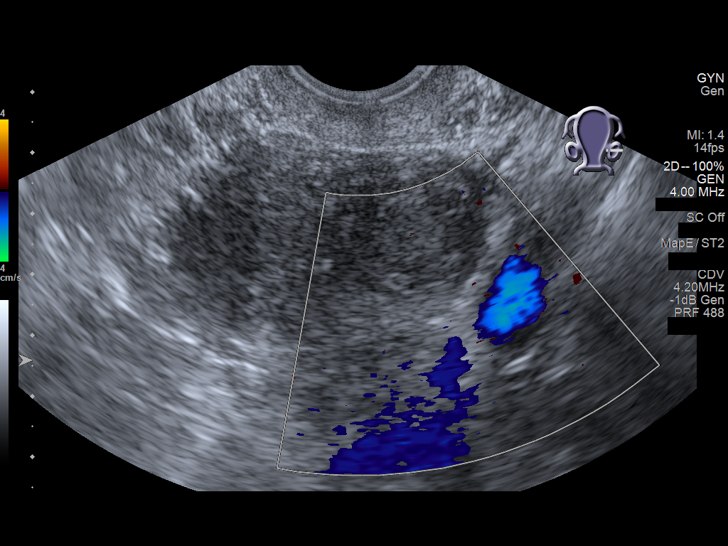

[14 of 25 positions shown; findings below may reference images not displayed]

FINDINGS: Uterus

Measurements: 6.3 x 3.6 x 6.3 cm = volume: 75 mL. Multiple uterine
fibroids, the largest an exophytic fundal fibroid measuring 3.9 cm.

Endometrium

Thickness: 3 mm in thickness.  No focal abnormality visualized.

Right ovary

Measurements: 3.0 x 1.6 x 1.9 cm = volume: 4.7 mL. Normal
appearance/no adnexal mass.

Left ovary

Measurements: 2.9 x 1.4 x 2.1 cm = volume: 4.4 mL. Normal
appearance/no adnexal mass.

Other findings

No abnormal free fluid.
IMPRESSION: Fibroid uterus.

No acute findings.

## 2019-10-26 ENCOUNTER — Other Ambulatory Visit: Payer: Self-pay

## 2019-10-26 ENCOUNTER — Emergency Department
Admission: EM | Admit: 2019-10-26 | Discharge: 2019-10-26 | Disposition: A | Discharge status: Home / Self Care | Payer: Self-pay | Attending: Emergency Medicine | Admitting: Emergency Medicine

## 2019-10-26 DIAGNOSIS — K0889 Other specified disorders of teeth and supporting structures: Secondary | ICD-10-CM | POA: Insufficient documentation

## 2019-10-26 DIAGNOSIS — Z5321 Procedure and treatment not carried out due to patient leaving prior to being seen by health care provider: Secondary | ICD-10-CM | POA: Insufficient documentation

## 2019-10-26 MED ORDER — LIDOCAINE VISCOUS HCL 2 % MT SOLN: 15.0000 mL | Freq: Once | OROMUCOSAL | Status: DC

## 2019-10-26 MED ORDER — LIDOCAINE VISCOUS HCL 2 % MT SOLN
15.0000 mL | Freq: Once | OROMUCOSAL | Status: AC
  Administered 2019-10-26: 15 mL via OROMUCOSAL

## 2019-10-26 NOTE — ED Triage Notes (Signed)
Pt c/o right lower dental pain x2 weeks that has increased over last day. Pt denies fever.

## 2019-11-22 ENCOUNTER — Encounter: Payer: Self-pay | Admitting: Emergency Medicine

## 2019-11-22 ENCOUNTER — Ambulatory Visit
Admission: EM | Admit: 2019-11-22 | Discharge: 2019-11-22 | Disposition: A | Discharge status: Home / Self Care | Payer: Self-pay | Attending: Physician Assistant | Admitting: Physician Assistant

## 2019-11-22 ENCOUNTER — Other Ambulatory Visit: Payer: Self-pay

## 2019-11-22 DIAGNOSIS — K0889 Other specified disorders of teeth and supporting structures: Secondary | ICD-10-CM

## 2019-11-22 DIAGNOSIS — K047 Periapical abscess without sinus: Secondary | ICD-10-CM

## 2019-11-22 MED ORDER — HYDROCODONE-ACETAMINOPHEN 5-325 MG PO TABS
2.0000 | ORAL_TABLET | Freq: Four times a day (QID) | ORAL | 0 refills | Status: AC | PRN
Start: 2019-11-22 — End: 2019-11-27

## 2019-11-22 MED ORDER — CLINDAMYCIN HCL 150 MG PO CAPS: 450.0000 mg | ORAL_CAPSULE | Freq: Three times a day (TID) | ORAL | 0 refills | Status: AC
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
## 2019-11-22 NOTE — ED Triage Notes (Signed)
Pt states she has an abscessed tooth on the bottom of the right side of her mouth. Pt states she has been dealing with the pain for about a month. She does not have insurance or the money to have it fixed.

## 2019-11-22 NOTE — Discharge Instructions (Signed)
You likely have a very deep dental infection you will ultimately need to see a dentist.  Began clindamycin antibiotic this time and make sure you take probiotics and eat plenty of yogurt.  It can cause you to have diarrhea, but it is the next medication to try since you have already tried Keflex and amoxicillin.  Increase your fluid intake and rest.  He can take Tylenol for pain and thousand helping you can take the other pain medication.  Follow-up with dentist soon as possible to have the tooth removed.  For any fevers or severe pain go to emergency room.

## 2019-12-06 ENCOUNTER — Encounter: Payer: Self-pay | Admitting: *Deleted

## 2019-12-06 ENCOUNTER — Emergency Department: Payer: Self-pay

## 2019-12-06 ENCOUNTER — Emergency Department
Admission: EM | Admit: 2019-12-06 | Discharge: 2019-12-06 | Disposition: A | Discharge status: Home / Self Care | Payer: Self-pay | Attending: Student in an Organized Health Care Education/Training Program | Admitting: Student in an Organized Health Care Education/Training Program

## 2019-12-06 ENCOUNTER — Other Ambulatory Visit: Payer: Self-pay

## 2019-12-06 DIAGNOSIS — E1165 Type 2 diabetes mellitus with hyperglycemia: Secondary | ICD-10-CM | POA: Insufficient documentation

## 2019-12-06 DIAGNOSIS — M79604 Pain in right leg: Secondary | ICD-10-CM | POA: Insufficient documentation

## 2019-12-06 DIAGNOSIS — M5441 Lumbago with sciatica, right side: Secondary | ICD-10-CM | POA: Insufficient documentation

## 2019-12-06 DIAGNOSIS — F17210 Nicotine dependence, cigarettes, uncomplicated: Secondary | ICD-10-CM | POA: Insufficient documentation

## 2019-12-06 LAB — URINALYSIS, COMPLETE (UACMP) WITH MICROSCOPIC
Bacteria, UA: NONE SEEN
Bilirubin Urine: NEGATIVE
Glucose, UA: >=500 mg/dL — AB
Hgb urine dipstick: NEGATIVE
Ketones, ur: NEGATIVE mg/dL
Leukocytes,Ua: NEGATIVE
Nitrite: NEGATIVE
Protein, ur: NEGATIVE mg/dL
Specific Gravity, Urine: 1.02 (ref 1.005–1.030)
pH: 5 (ref 5.0–8.0)

## 2019-12-06 LAB — GLUCOSE, CAPILLARY: Glucose-Capillary: 331 mg/dL — ABNORMAL HIGH (ref 70–99)

## 2019-12-06 MED ORDER — LIDOCAINE 5 % EX PTCH
1.0000 | MEDICATED_PATCH | CUTANEOUS | Status: DC
  Administered 2019-12-06: 1 via TRANSDERMAL
  Filled 2019-12-06: qty 1

## 2019-12-06 MED ORDER — TRAMADOL HCL 50 MG PO TABS
50.0000 mg | ORAL_TABLET | Freq: Four times a day (QID) | ORAL | 0 refills | Status: AC | PRN
Start: 2019-12-06 — End: 2019-12-11

## 2019-12-06 MED ORDER — METHOCARBAMOL 1000 MG/10ML IJ SOLN
1000.0000 mg | Freq: Once | INTRAMUSCULAR | Status: DC
  Filled 2019-12-06: qty 10

## 2019-12-06 MED ORDER — METHOCARBAMOL 500 MG PO TABS
1000.0000 mg | ORAL_TABLET | Freq: Once | ORAL | Status: AC
  Administered 2019-12-06: 1000 mg via ORAL

## 2019-12-06 MED ORDER — OXYCODONE-ACETAMINOPHEN 5-325 MG PO TABS
1.0000 | ORAL_TABLET | Freq: Once | ORAL | Status: AC
  Administered 2019-12-06: 1 via ORAL
  Filled 2019-12-06: qty 1

## 2019-12-06 MED ORDER — METHOCARBAMOL 750 MG PO TABS: 750.0000 mg | ORAL_TABLET | Freq: Four times a day (QID) | ORAL | 0 refills | Status: AC | PRN

## 2019-12-06 MED ORDER — KETOROLAC TROMETHAMINE 30 MG/ML IJ SOLN
30.0000 mg | Freq: Once | INTRAMUSCULAR | Status: AC
  Administered 2019-12-06: 30 mg via INTRAMUSCULAR
  Filled 2019-12-06: qty 1

## 2019-12-06 NOTE — ED Triage Notes (Signed)
Pt arrived via ems from home. C/o right sided back pain. Ems reports pt states "im not able to do anything". Hx left side back surgery. Takes gabapentin for chronic back pain. EMS states pain increased yesterday. Pt states unable to sit up in wheelchair placed in recliner by EMS.   Ems vitals 161/80 Hr 80
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
## 2019-12-06 NOTE — ED Triage Notes (Signed)
Pt brought in via ems from home with back pain.  Pt states she is unable to walk.  Hx chronic back pain.  Pt states pain worse since yesterday.   No known injury   Pt alert  Speech clear.

## 2020-01-11 ENCOUNTER — Other Ambulatory Visit: Payer: Self-pay | Admitting: Psychiatry

## 2020-01-28 ENCOUNTER — Other Ambulatory Visit: Payer: Self-pay

## 2020-01-28 ENCOUNTER — Emergency Department: Payer: Self-pay

## 2020-01-28 ENCOUNTER — Emergency Department
Admission: EM | Admit: 2020-01-28 | Discharge: 2020-01-28 | Disposition: A | Discharge status: Home / Self Care | Payer: Self-pay | Attending: Emergency Medicine | Admitting: Emergency Medicine

## 2020-01-28 DIAGNOSIS — F17210 Nicotine dependence, cigarettes, uncomplicated: Secondary | ICD-10-CM | POA: Insufficient documentation

## 2020-01-28 DIAGNOSIS — M25561 Pain in right knee: Secondary | ICD-10-CM

## 2020-01-28 DIAGNOSIS — E119 Type 2 diabetes mellitus without complications: Secondary | ICD-10-CM | POA: Insufficient documentation

## 2020-01-28 DIAGNOSIS — S82143A Displaced bicondylar fracture of unspecified tibia, initial encounter for closed fracture: Secondary | ICD-10-CM

## 2020-01-28 DIAGNOSIS — X509XXA Other and unspecified overexertion or strenuous movements or postures, initial encounter: Secondary | ICD-10-CM | POA: Insufficient documentation

## 2020-01-28 DIAGNOSIS — S82121A Displaced fracture of lateral condyle of right tibia, initial encounter for closed fracture: Secondary | ICD-10-CM | POA: Insufficient documentation

## 2020-01-28 MED ORDER — KETOROLAC TROMETHAMINE 60 MG/2ML IM SOLN
30.0000 mg | Freq: Once | INTRAMUSCULAR | Status: AC
  Administered 2020-01-28: 04:00:00 30 mg via INTRAMUSCULAR
  Filled 2020-01-28: qty 2

## 2020-01-28 MED ORDER — OXYCODONE-ACETAMINOPHEN 5-325 MG PO TABS
1.0000 | ORAL_TABLET | Freq: Once | ORAL | Status: AC
  Administered 2020-01-28: 06:00:00 1 via ORAL
  Filled 2020-01-28: qty 1

## 2020-01-28 MED ORDER — OXYCODONE-ACETAMINOPHEN 5-325 MG PO TABS
1.0000 | ORAL_TABLET | Freq: Once | ORAL | Status: AC
  Administered 2020-01-28: 04:00:00 1 via ORAL
  Filled 2020-01-28: qty 1

## 2020-01-28 MED ORDER — OXYCODONE-ACETAMINOPHEN 5-325 MG PO TABS: 1.0000 | ORAL_TABLET | ORAL | 0 refills | Status: DC | PRN

## 2020-01-28 NOTE — ED Triage Notes (Signed)
States she stepped off a truck today and heard a pop in her right knee. Pt complains of right knee pain. Pt ambulatory into waiting room without difficulty.

## 2020-01-28 NOTE — Discharge Instructions (Signed)
You may take ibuprofen as needed for pain, Percocet as needed for more severe pain. Wear knee immobilizer while awake. Do not bear weight on right leg. Use crutches to help you walk. Elevate affected area and apply ice over knee several times daily to reduce swelling. Return to the ER for worsening symptoms, persistent vomiting, difficulty breathing or other concerns.

## 2020-01-28 NOTE — ED Notes (Signed)
Pt currently waiting for ct, CT contacted by this RN and was informed that pt should be taken for CT in around 30 mins.
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
## 2020-03-09 ENCOUNTER — Ambulatory Visit: Payer: Self-pay | Admitting: Pharmacy Technician

## 2020-03-09 ENCOUNTER — Other Ambulatory Visit: Payer: Self-pay

## 2020-03-09 DIAGNOSIS — Z79899 Other long term (current) drug therapy: Secondary | ICD-10-CM

## 2020-03-09 NOTE — Progress Notes (Signed)
Completed [facility] application and contract.  Patient agreed to all terms of the [facility] contract.    Patient to provide 2022 financial documentation.    Provided patient with community resource material based on her particular needs.    [facility]

## 2020-03-15 ENCOUNTER — Ambulatory Visit: Payer: Self-pay

## 2020-04-23 ENCOUNTER — Encounter: Payer: Self-pay | Admitting: Emergency Medicine

## 2020-04-23 ENCOUNTER — Other Ambulatory Visit: Payer: Self-pay

## 2020-04-23 ENCOUNTER — Emergency Department: Payer: Self-pay

## 2020-04-23 ENCOUNTER — Emergency Department
Admission: EM | Admit: 2020-04-23 | Discharge: 2020-04-23 | Disposition: A | Discharge status: Home / Self Care | Payer: Self-pay | Attending: Student in an Organized Health Care Education/Training Program | Admitting: Student in an Organized Health Care Education/Training Program

## 2020-04-23 DIAGNOSIS — K219 Gastro-esophageal reflux disease without esophagitis: Secondary | ICD-10-CM | POA: Insufficient documentation

## 2020-04-23 DIAGNOSIS — R350 Frequency of micturition: Secondary | ICD-10-CM | POA: Insufficient documentation

## 2020-04-23 DIAGNOSIS — R110 Nausea: Secondary | ICD-10-CM | POA: Insufficient documentation

## 2020-04-23 DIAGNOSIS — F17210 Nicotine dependence, cigarettes, uncomplicated: Secondary | ICD-10-CM | POA: Insufficient documentation

## 2020-04-23 DIAGNOSIS — E1165 Type 2 diabetes mellitus with hyperglycemia: Secondary | ICD-10-CM | POA: Insufficient documentation

## 2020-04-23 DIAGNOSIS — R197 Diarrhea, unspecified: Secondary | ICD-10-CM | POA: Insufficient documentation

## 2020-04-23 DIAGNOSIS — R109 Unspecified abdominal pain: Secondary | ICD-10-CM | POA: Insufficient documentation

## 2020-04-23 LAB — URINALYSIS, COMPLETE (UACMP) WITH MICROSCOPIC
Bilirubin Urine: NEGATIVE
Glucose, UA: >=500 mg/dL — AB
Hgb urine dipstick: NEGATIVE
Ketones, ur: NEGATIVE mg/dL
Leukocytes,Ua: NEGATIVE
Nitrite: NEGATIVE
Protein, ur: NEGATIVE mg/dL
Specific Gravity, Urine: 1.025 (ref 1.005–1.030)
pH: 5 (ref 5.0–8.0)

## 2020-04-23 LAB — CBC
HCT: 41.5 % (ref 36.0–46.0)
Hemoglobin: 13.5 g/dL (ref 12.0–15.0)
MCH: 28.9 pg (ref 26.0–34.0)
MCHC: 32.5 g/dL (ref 30.0–36.0)
MCV: 88.9 fL (ref 80.0–100.0)
Platelets: 215 10*3/uL (ref 150–400)
RBC: 4.67 MIL/uL (ref 3.87–5.11)
RDW: 13.1 % (ref 11.5–15.5)
WBC: 10.8 10*3/uL — ABNORMAL HIGH (ref 4.0–10.5)
nRBC: 0 % (ref 0.0–0.2)

## 2020-04-23 LAB — COMPREHENSIVE METABOLIC PANEL
ALT: 18 U/L (ref 0–44)
AST: 21 U/L (ref 15–41)
Albumin: 3.7 g/dL (ref 3.5–5.0)
Alkaline Phosphatase: 113 U/L (ref 38–126)
Anion gap: 11 (ref 5–15)
BUN: 11 mg/dL (ref 6–20)
CO2: 20 mmol/L — ABNORMAL LOW (ref 22–32)
Calcium: 8.8 mg/dL — ABNORMAL LOW (ref 8.9–10.3)
Chloride: 105 mmol/L (ref 98–111)
Creatinine, Ser: 0.69 mg/dL (ref 0.44–1.00)
GFR, Estimated: >60 mL/min (ref >60)
Glucose, Bld: 446 mg/dL — ABNORMAL HIGH (ref 70–99)
Potassium: 3.7 mmol/L (ref 3.5–5.1)
Sodium: 136 mmol/L (ref 135–145)
Total Bilirubin: 0.8 mg/dL (ref 0.3–1.2)
Total Protein: 6.9 g/dL (ref 6.5–8.1)

## 2020-04-23 LAB — CBG MONITORING, ED: Glucose-Capillary: 414 mg/dL — ABNORMAL HIGH (ref 70–99)

## 2020-04-23 MED ORDER — DICYCLOMINE HCL 10 MG PO CAPS: 10.0000 mg | ORAL_CAPSULE | Freq: Three times a day (TID) | ORAL | 0 refills | Status: DC | PRN

## 2020-04-23 MED ORDER — MORPHINE SULFATE (PF) 4 MG/ML IV SOLN
4.0000 mg | INTRAVENOUS | Status: DC | PRN
  Administered 2020-04-23: 4 mg via INTRAVENOUS
  Filled 2020-04-23: qty 1

## 2020-04-23 MED ORDER — ONDANSETRON HCL 4 MG/2ML IJ SOLN
4.0000 mg | Freq: Once | INTRAMUSCULAR | Status: AC
  Administered 2020-04-23: 4 mg via INTRAVENOUS
  Filled 2020-04-23: qty 2

## 2020-04-23 MED ORDER — ONDANSETRON HCL 4 MG PO TABS: 4.0000 mg | ORAL_TABLET | Freq: Every day | ORAL | 0 refills | Status: DC | PRN

## 2020-04-23 MED ORDER — DIPHENHYDRAMINE HCL 25 MG PO CAPS
25.0000 mg | ORAL_CAPSULE | Freq: Once | ORAL | Status: AC
  Administered 2020-04-23: 25 mg via ORAL
  Filled 2020-04-23: qty 1

## 2020-04-23 MED ORDER — SODIUM CHLORIDE 0.9 % IV BOLUS
1000.0000 mL | Freq: Once | INTRAVENOUS | Status: AC
  Administered 2020-04-23: 1000 mL via INTRAVENOUS

## 2020-04-23 NOTE — Discharge Instructions (Signed)

## 2020-04-23 NOTE — ED Notes (Addendum)
Itching has subsided. RR even and unlabored, color WNL.

## 2020-04-23 NOTE — ED Notes (Signed)
Able to drink without emesis.
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
## 2020-04-23 NOTE — ED Notes (Signed)
States she will call friend to take her home.

## 2020-04-23 NOTE — ED Notes (Addendum)
Pt alert and oriented X 4, stable for discharge. RR even and unlabored, color WNL. Discussed discharge instructions and follow-up as directed. Discharge medications discussed if prescribed. Pt had opportunity to ask questions, and RN to provide patient/family eduction. Pt confirms that she has called her friend for a ride and understands that she cannot drive at this time due to medications given. Ambulatory.

## 2020-04-23 NOTE — ED Notes (Signed)
Pt currently attempting to drink PO.

## 2020-04-23 NOTE — ED Notes (Addendum)
Pt c/o left forearm itching only. No upper arm itching, no other itching noted. RR even and unlabored. EDP notified.

## 2020-04-23 NOTE — ED Triage Notes (Signed)
Patient to ER for c/o right sided flank pain. Reports nausea and headache yesterday (Covid test this am was negative.). Denies any known fevers. Reports she has been sleeping more than usual last few days. +N/D. Denies any emesis as of yet. Reports diarrhea "all weekend."

## 2020-04-23 NOTE — ED Notes (Addendum)
Pt drove here, but contracts with RN not to drive home before medication administration. Verbalizes understanding that she cannot drive after medicines administered. . Pt states that she has a friend that can take her home.

## 2020-05-14 ENCOUNTER — Emergency Department
Admission: EM | Admit: 2020-05-14 | Discharge: 2020-05-14 | Disposition: A | Discharge status: Home / Self Care | Payer: PRIVATE HEALTH INSURANCE | Attending: Emergency Medicine | Admitting: Emergency Medicine

## 2020-05-14 ENCOUNTER — Other Ambulatory Visit: Payer: Self-pay

## 2020-05-14 DIAGNOSIS — F17210 Nicotine dependence, cigarettes, uncomplicated: Secondary | ICD-10-CM | POA: Diagnosis not present

## 2020-05-14 DIAGNOSIS — E119 Type 2 diabetes mellitus without complications: Secondary | ICD-10-CM | POA: Insufficient documentation

## 2020-05-14 DIAGNOSIS — M25561 Pain in right knee: Secondary | ICD-10-CM | POA: Insufficient documentation

## 2020-05-14 MED ORDER — KETOROLAC TROMETHAMINE 30 MG/ML IJ SOLN
30.0000 mg | Freq: Once | INTRAMUSCULAR | Status: AC
  Administered 2020-05-14: 30 mg via INTRAMUSCULAR
  Filled 2020-05-14: qty 1

## 2020-05-14 MED ORDER — PREDNISONE 10 MG (21) PO TBPK: ORAL_TABLET | ORAL | 0 refills | Status: DC

## 2020-05-14 MED ORDER — PREDNISONE 10 MG (21) PO TBPK
ORAL_TABLET | ORAL | 0 refills | Status: DC
  Filled 2020-05-14: qty 21, fill #0

## 2020-05-14 NOTE — ED Notes (Signed)
See triage note  Presents with right knee pain  States pain has been there for couple of months  Denies any new injury  States pain became worse after getting cortisone shot

## 2020-05-14 NOTE — ED Triage Notes (Addendum)
Pt injured right knee in December and has had pain since. Did get cortisone shot to same in February but states pain is worsening. Pt denies any re injury.

## 2020-05-14 NOTE — Discharge Instructions (Signed)
Take tapered steroid as directed.  
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
## 2020-05-15 ENCOUNTER — Other Ambulatory Visit: Payer: Self-pay

## 2020-05-15 MED ORDER — TRAZODONE HCL 100 MG PO TABS: ORAL_TABLET | ORAL | 1 refills | Status: DC

## 2020-07-05 ENCOUNTER — Ambulatory Visit: Payer: Self-pay

## 2020-07-06 ENCOUNTER — Ambulatory Visit: Payer: Self-pay

## 2020-07-12 ENCOUNTER — Ambulatory Visit: Payer: Managed Care, Other (non HMO)

## 2020-07-16 ENCOUNTER — Other Ambulatory Visit: Payer: Self-pay

## 2020-07-16 ENCOUNTER — Ambulatory Visit: Payer: Managed Care, Other (non HMO) | Admitting: Physician Assistant

## 2020-07-16 DIAGNOSIS — Z113 Encounter for screening for infections with a predominantly sexual mode of transmission: Secondary | ICD-10-CM

## 2020-07-16 DIAGNOSIS — Z202 Contact with and (suspected) exposure to infections with a predominantly sexual mode of transmission: Secondary | ICD-10-CM

## 2020-07-16 MED ORDER — METRONIDAZOLE 500 MG PO TABS: 500.0000 mg | ORAL_TABLET | Freq: Two times a day (BID) | ORAL | 0 refills | Status: AC

## 2020-07-17 ENCOUNTER — Ambulatory Visit: Payer: Managed Care, Other (non HMO)

## 2020-07-17 NOTE — Progress Notes (Signed)
S:  Patient walks into clinic requesting treatment as a contact to [place].  States that she is having "nasty" discharge and found out that her partner was treated for Trich.  Declines any screening today and requests treatment only.  Denies changes to her history, allergies and medicines. O:  WDWN female in NAD, A&O x 3, normal work of breathing. A/P:  1.  Patient requests treatment as a contact to Trich and declines all other screening. 2.  Will treat as a contact to Trich with Metronidazole 500 mg #14 1 po BID for 7 days with food, no EtOH for 24 hr before and until 72 hr after completing medicine. The patient was dispensed Metronidazole today. I provided counseling today regarding the medication. We discussed the medication, the side effects and when to call clinic. Patient given the opportunity to ask questions. Questions answered.  3.  No sex for 10 days and until after partner completes treatment. 4.  Enc to use OTC antifungal cream if has itching during or just after treatment with antibiotic. 5.  Enc condoms with all sex. 6.  RTC prn.

## 2020-07-21 NOTE — Progress Notes (Signed)
Chart reviewed by Pharmacist  Suzanne Walker PharmD, Contract Pharmacist at [facility]  

## 2020-08-28 ENCOUNTER — Other Ambulatory Visit: Payer: Self-pay

## 2020-08-28 ENCOUNTER — Emergency Department: Payer: Self-pay

## 2020-08-28 ENCOUNTER — Emergency Department
Admission: EM | Admit: 2020-08-28 | Discharge: 2020-08-28 | Disposition: A | Discharge status: Home / Self Care | Payer: Self-pay | Attending: Emergency Medicine | Admitting: Emergency Medicine

## 2020-08-28 DIAGNOSIS — E119 Type 2 diabetes mellitus without complications: Secondary | ICD-10-CM | POA: Insufficient documentation

## 2020-08-28 DIAGNOSIS — F17210 Nicotine dependence, cigarettes, uncomplicated: Secondary | ICD-10-CM | POA: Insufficient documentation

## 2020-08-28 DIAGNOSIS — M5441 Lumbago with sciatica, right side: Secondary | ICD-10-CM | POA: Insufficient documentation

## 2020-08-28 LAB — URINALYSIS, COMPLETE (UACMP) WITH MICROSCOPIC
Bilirubin Urine: NEGATIVE
Glucose, UA: >=500 mg/dL — AB
Hgb urine dipstick: NEGATIVE
Ketones, ur: NEGATIVE mg/dL
Leukocytes,Ua: NEGATIVE
Nitrite: NEGATIVE
Protein, ur: NEGATIVE mg/dL
Specific Gravity, Urine: 1.035 — ABNORMAL HIGH (ref 1.005–1.030)
pH: 5 (ref 5.0–8.0)

## 2020-08-28 MED ORDER — METHOCARBAMOL 500 MG PO TABS
ORAL_TABLET | ORAL | 0 refills | Status: DC
  Filled 2020-08-28: qty 15, 2d supply, fill #0

## 2020-08-28 MED ORDER — ONDANSETRON 4 MG PO TBDP
4.0000 mg | ORAL_TABLET | Freq: Once | ORAL | Status: AC
  Administered 2020-08-28: 4 mg via ORAL
  Filled 2020-08-28: qty 1

## 2020-08-28 MED ORDER — OXYCODONE-ACETAMINOPHEN 7.5-325 MG PO TABS
1.0000 | ORAL_TABLET | Freq: Once | ORAL | Status: AC
  Administered 2020-08-28: 1 via ORAL
  Filled 2020-08-28: qty 1

## 2020-08-28 MED ORDER — METHOCARBAMOL 500 MG PO TABS
500.0000 mg | ORAL_TABLET | Freq: Once | ORAL | Status: AC
  Administered 2020-08-28: 500 mg via ORAL
  Filled 2020-08-28: qty 1

## 2020-08-28 MED ORDER — DIPHENHYDRAMINE HCL 50 MG/ML IJ SOLN
25.0000 mg | Freq: Once | INTRAMUSCULAR | Status: AC
  Administered 2020-08-28: 25 mg via INTRAMUSCULAR
  Filled 2020-08-28: qty 1

## 2020-08-28 MED ORDER — PREDNISONE 10 MG PO TABS
ORAL_TABLET | ORAL | 0 refills | Status: DC
  Filled 2020-08-28: qty 21, 6d supply, fill #0
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
## 2020-08-28 NOTE — Discharge Instructions (Addendum)
Call make an appoint with your primary care provider if any continued problems.  You may use ice or heat to your back as needed for discomfort.  Begin taking the muscle relaxant as directed and the prednisone to help with inflammation and pain.  If you continue to have pain you can also follow-up with Dr. Mack Guise who is on-call for orthopedics.

## 2020-08-28 NOTE — ED Triage Notes (Signed)
Pt c/o lower back pain since thursday, states she has been taking IBU with no relief.

## 2020-08-29 ENCOUNTER — Other Ambulatory Visit: Payer: Self-pay

## 2020-08-30 ENCOUNTER — Other Ambulatory Visit: Payer: Self-pay

## 2020-09-29 ENCOUNTER — Emergency Department
Admission: EM | Admit: 2020-09-29 | Discharge: 2020-09-29 | Disposition: A | Discharge status: Home / Self Care | Payer: Self-pay | Attending: Emergency Medicine | Admitting: Emergency Medicine

## 2020-09-29 ENCOUNTER — Emergency Department: Payer: Self-pay

## 2020-09-29 ENCOUNTER — Other Ambulatory Visit: Payer: Self-pay

## 2020-09-29 DIAGNOSIS — H9209 Otalgia, unspecified ear: Secondary | ICD-10-CM | POA: Insufficient documentation

## 2020-09-29 DIAGNOSIS — F17210 Nicotine dependence, cigarettes, uncomplicated: Secondary | ICD-10-CM | POA: Insufficient documentation

## 2020-09-29 DIAGNOSIS — E119 Type 2 diabetes mellitus without complications: Secondary | ICD-10-CM | POA: Insufficient documentation

## 2020-09-29 DIAGNOSIS — U071 COVID-19: Secondary | ICD-10-CM | POA: Insufficient documentation

## 2020-09-29 LAB — COMPREHENSIVE METABOLIC PANEL
ALT: 17 U/L (ref 0–44)
AST: 15 U/L (ref 15–41)
Albumin: 3.3 g/dL — ABNORMAL LOW (ref 3.5–5.0)
Alkaline Phosphatase: 145 U/L — ABNORMAL HIGH (ref 38–126)
Anion gap: 5 (ref 5–15)
BUN: 11 mg/dL (ref 6–20)
CO2: 23 mmol/L (ref 22–32)
Calcium: 8.7 mg/dL — ABNORMAL LOW (ref 8.9–10.3)
Chloride: 107 mmol/L (ref 98–111)
Creatinine, Ser: 0.56 mg/dL (ref 0.44–1.00)
GFR, Estimated: >60 mL/min (ref >60)
Glucose, Bld: 359 mg/dL — ABNORMAL HIGH (ref 70–99)
Potassium: 4 mmol/L (ref 3.5–5.1)
Sodium: 135 mmol/L (ref 135–145)
Total Bilirubin: 0.3 mg/dL (ref 0.3–1.2)
Total Protein: 6.3 g/dL — ABNORMAL LOW (ref 6.5–8.1)

## 2020-09-29 LAB — CBC
HCT: 36.7 % (ref 36.0–46.0)
Hemoglobin: 12.4 g/dL (ref 12.0–15.0)
MCH: 29.7 pg (ref 26.0–34.0)
MCHC: 33.8 g/dL (ref 30.0–36.0)
MCV: 88 fL (ref 80.0–100.0)
Platelets: 204 10*3/uL (ref 150–400)
RBC: 4.17 MIL/uL (ref 3.87–5.11)
RDW: 13.1 % (ref 11.5–15.5)
WBC: 10.7 10*3/uL — ABNORMAL HIGH (ref 4.0–10.5)
nRBC: 0 % (ref 0.0–0.2)

## 2020-09-29 MED ORDER — ONDANSETRON HCL 4 MG/2ML IJ SOLN: 4.0000 mg | Freq: Once | INTRAMUSCULAR | Status: DC

## 2020-09-29 MED ORDER — SODIUM CHLORIDE 0.9 % IV SOLN
12.5000 mg | Freq: Four times a day (QID) | INTRAVENOUS | Status: DC | PRN
  Administered 2020-09-29: 12.5 mg via INTRAVENOUS
  Filled 2020-09-29: qty 12.5
  Filled 2020-09-29: qty 0.5

## 2020-09-29 MED ORDER — NIRMATRELVIR/RITONAVIR (PAXLOVID)TABLET: 3.0000 | ORAL_TABLET | Freq: Two times a day (BID) | ORAL | 0 refills | Status: AC

## 2020-09-29 MED ORDER — NIRMATRELVIR/RITONAVIR (PAXLOVID)TABLET
3.0000 | ORAL_TABLET | Freq: Two times a day (BID) | ORAL | 0 refills | Status: DC
  Filled 2020-09-29: qty 30, 5d supply, fill #0

## 2020-09-29 MED ORDER — ACETAMINOPHEN 325 MG PO TABS
650.0000 mg | ORAL_TABLET | Freq: Once | ORAL | Status: AC | PRN
  Administered 2020-09-29: 650 mg via ORAL
  Filled 2020-09-29: qty 2

## 2020-09-29 MED ORDER — TRAMADOL HCL 50 MG PO TABS
50.0000 mg | ORAL_TABLET | Freq: Once | ORAL | Status: AC
Start: 2020-09-29 — End: 2020-09-29
  Administered 2020-09-29: 50 mg via ORAL
  Filled 2020-09-29: qty 1

## 2020-09-29 MED ORDER — KETOROLAC TROMETHAMINE 30 MG/ML IJ SOLN
30.0000 mg | Freq: Once | INTRAMUSCULAR | Status: AC
  Administered 2020-09-29: 30 mg via INTRAVENOUS
  Filled 2020-09-29: qty 1

## 2020-09-29 NOTE — Discharge Instructions (Addendum)
You were seen today for COVID-19 symptoms.  I have given you prescription for the oral antiviral twice daily for the next 5 days.  We recommend rest and fluids.  You may continue Tylenol OTC as needed for headache and body aches.  Please follow-up with your PCP if symptoms persist.
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
## 2020-09-29 NOTE — ED Triage Notes (Signed)
Pt comes ems from home with covid positive Wednesday. C/o cough, body aches. CBG 426. 20G R hand. 567m fluids.

## 2020-10-01 ENCOUNTER — Other Ambulatory Visit: Payer: Self-pay

## 2020-10-02 ENCOUNTER — Other Ambulatory Visit: Payer: Self-pay

## 2020-11-28 ENCOUNTER — Ambulatory Visit: Payer: Self-pay

## 2020-12-26 ENCOUNTER — Other Ambulatory Visit: Payer: Self-pay

## 2020-12-26 ENCOUNTER — Ambulatory Visit
Admission: RE | Admit: 2020-12-26 | Discharge: 2020-12-26 | Disposition: A | Discharge status: Home / Self Care | Payer: Self-pay | Source: Ambulatory Visit | Attending: Oncology | Admitting: Oncology

## 2020-12-26 ENCOUNTER — Ambulatory Visit: Payer: Self-pay | Attending: Oncology

## 2020-12-26 DIAGNOSIS — N6310 Unspecified lump in the right breast, unspecified quadrant: Secondary | ICD-10-CM

## 2020-12-27 NOTE — Progress Notes (Signed)
Patient pre-screened for BCCCP eligibility due to COVID 19 precautions. Two patient identifiers used for verification that I was speaking to correct patient.  Patient to Present directly to [facility] 12/26/20 for BCCCP screening mammogram.

## 2020-12-27 NOTE — Progress Notes (Signed)
Letter mailed from [facility] to notify of normal mammogram results.  Patient to return in one year for annual screening.  Copy to [facility]. 

## 2021-01-24 ENCOUNTER — Other Ambulatory Visit: Payer: Self-pay

## 2021-03-18 ENCOUNTER — Other Ambulatory Visit: Payer: Self-pay

## 2021-03-18 ENCOUNTER — Emergency Department
Admission: EM | Admit: 2021-03-18 | Discharge: 2021-03-19 | Disposition: A | Discharge status: Other Acute Inpatient Hospital | Payer: 59 | Attending: Student in an Organized Health Care Education/Training Program | Admitting: Student in an Organized Health Care Education/Training Program

## 2021-03-18 DIAGNOSIS — R7309 Other abnormal glucose: Secondary | ICD-10-CM | POA: Insufficient documentation

## 2021-03-18 DIAGNOSIS — Z20822 Contact with and (suspected) exposure to covid-19: Secondary | ICD-10-CM | POA: Insufficient documentation

## 2021-03-18 DIAGNOSIS — F1020 Alcohol dependence, uncomplicated: Secondary | ICD-10-CM | POA: Diagnosis present

## 2021-03-18 DIAGNOSIS — R45851 Suicidal ideations: Secondary | ICD-10-CM

## 2021-03-18 DIAGNOSIS — F1090 Alcohol use, unspecified, uncomplicated: Secondary | ICD-10-CM | POA: Insufficient documentation

## 2021-03-18 DIAGNOSIS — F32A Depression, unspecified: Secondary | ICD-10-CM

## 2021-03-18 LAB — URINE DRUG SCREEN, QUALITATIVE (ARMC ONLY)
Amphetamines, Ur Screen: NOT DETECTED
Barbiturates, Ur Screen: NOT DETECTED
Benzodiazepine, Ur Scrn: NOT DETECTED
Cannabinoid 50 Ng, Ur ~~LOC~~: NOT DETECTED
Cocaine Metabolite,Ur ~~LOC~~: POSITIVE — AB
MDMA (Ecstasy)Ur Screen: NOT DETECTED
Methadone Scn, Ur: NOT DETECTED
Opiate, Ur Screen: NOT DETECTED
Phencyclidine (PCP) Ur S: NOT DETECTED
Tricyclic, Ur Screen: NOT DETECTED

## 2021-03-18 LAB — COMPREHENSIVE METABOLIC PANEL
ALT: 15 U/L (ref 0–44)
AST: 14 U/L — ABNORMAL LOW (ref 15–41)
Albumin: 3.9 g/dL (ref 3.5–5.0)
Alkaline Phosphatase: 121 U/L (ref 38–126)
Anion gap: 13 (ref 5–15)
BUN: 10 mg/dL (ref 6–20)
CO2: 17 mmol/L — ABNORMAL LOW (ref 22–32)
Calcium: 9.4 mg/dL (ref 8.9–10.3)
Chloride: 103 mmol/L (ref 98–111)
Creatinine, Ser: 0.7 mg/dL (ref 0.44–1.00)
GFR, Estimated: >60 mL/min (ref >60)
Glucose, Bld: 382 mg/dL — ABNORMAL HIGH (ref 70–99)
Potassium: 3.9 mmol/L (ref 3.5–5.1)
Sodium: 133 mmol/L — ABNORMAL LOW (ref 135–145)
Total Bilirubin: 0.5 mg/dL (ref 0.3–1.2)
Total Protein: 7.5 g/dL (ref 6.5–8.1)

## 2021-03-18 LAB — CBC
HCT: 43 % (ref 36.0–46.0)
Hemoglobin: 14.5 g/dL (ref 12.0–15.0)
MCH: 29 pg (ref 26.0–34.0)
MCHC: 33.7 g/dL (ref 30.0–36.0)
MCV: 86 fL (ref 80.0–100.0)
Platelets: 316 10*3/uL (ref 150–400)
RBC: 5 MIL/uL (ref 3.87–5.11)
RDW: 12.8 % (ref 11.5–15.5)
WBC: 10.8 10*3/uL — ABNORMAL HIGH (ref 4.0–10.5)
nRBC: 0 % (ref 0.0–0.2)

## 2021-03-18 LAB — CBG MONITORING, ED
Glucose-Capillary: 341 mg/dL — ABNORMAL HIGH (ref 70–99)
Glucose-Capillary: 262 mg/dL — ABNORMAL HIGH (ref 70–99)
Glucose-Capillary: 159 mg/dL — ABNORMAL HIGH (ref 70–99)
Glucose-Capillary: 248 mg/dL — ABNORMAL HIGH (ref 70–99)
Glucose-Capillary: 260 mg/dL — ABNORMAL HIGH (ref 70–99)
Glucose-Capillary: 300 mg/dL — ABNORMAL HIGH (ref 70–99)

## 2021-03-18 LAB — RESP PANEL BY RT-PCR (FLU A&B, COVID) ARPGX2
Influenza A by PCR: NEGATIVE
Influenza B by PCR: NEGATIVE
SARS Coronavirus 2 by RT PCR: NEGATIVE

## 2021-03-18 LAB — BASIC METABOLIC PANEL
Anion gap: 9 (ref 5–15)
BUN: 16 mg/dL (ref 6–20)
CO2: 23 mmol/L (ref 22–32)
Calcium: 9.7 mg/dL (ref 8.9–10.3)
Chloride: 104 mmol/L (ref 98–111)
Creatinine, Ser: 0.66 mg/dL (ref 0.44–1.00)
GFR, Estimated: >60 mL/min (ref >60)
Glucose, Bld: 317 mg/dL — ABNORMAL HIGH (ref 70–99)
Potassium: 4.2 mmol/L (ref 3.5–5.1)
Sodium: 136 mmol/L (ref 135–145)

## 2021-03-18 LAB — ETHANOL: Alcohol, Ethyl (B): 13 mg/dL — ABNORMAL HIGH (ref <10)

## 2021-03-18 LAB — HEMOGLOBIN A1C
Hgb A1c MFr Bld: 11.4 % — ABNORMAL HIGH (ref 4.8–5.6)
Mean Plasma Glucose: 280.48 mg/dL

## 2021-03-18 LAB — ACETAMINOPHEN LEVEL: Acetaminophen (Tylenol), Serum: <10 ug/mL — ABNORMAL LOW (ref 10–30)

## 2021-03-18 LAB — SALICYLATE LEVEL: Salicylate Lvl: <7 mg/dL — ABNORMAL LOW (ref 7.0–30.0)

## 2021-03-18 MED ORDER — INSULIN ASPART 100 UNIT/ML IJ SOLN
0.0000 [IU] | Freq: Every day | INTRAMUSCULAR | Status: DC
  Administered 2021-03-18: 3 [IU] via SUBCUTANEOUS
  Filled 2021-03-18: qty 1

## 2021-03-18 MED ORDER — LAMOTRIGINE 100 MG PO TABS
100.0000 mg | ORAL_TABLET | Freq: Every day | ORAL | Status: DC
  Administered 2021-03-18 – 2021-03-19 (×2): 100 mg via ORAL
  Filled 2021-03-18 (×2): qty 1

## 2021-03-18 MED ORDER — INSULIN GLARGINE-YFGN 100 UNIT/ML ~~LOC~~ SOLN
30.0000 [IU] | Freq: Every day | SUBCUTANEOUS | Status: DC
  Filled 2021-03-18: qty 0.3

## 2021-03-18 MED ORDER — CLOTRIMAZOLE 1 % EX CREA
1.0000 "application " | TOPICAL_CREAM | Freq: Two times a day (BID) | CUTANEOUS | Status: DC
  Administered 2021-03-18 – 2021-03-19 (×3): 1 via TOPICAL
  Filled 2021-03-18: qty 15

## 2021-03-18 MED ORDER — INSULIN ASPART 100 UNIT/ML IJ SOLN
0.0000 [IU] | Freq: Three times a day (TID) | INTRAMUSCULAR | Status: DC
  Administered 2021-03-18: 3 [IU] via SUBCUTANEOUS
  Administered 2021-03-18: 8 [IU] via SUBCUTANEOUS
  Administered 2021-03-19: 11 [IU] via SUBCUTANEOUS
  Filled 2021-03-18 (×3): qty 1

## 2021-03-18 MED ORDER — INSULIN ASPART 100 UNIT/ML IJ SOLN: 0.0000 [IU] | Freq: Three times a day (TID) | INTRAMUSCULAR | Status: DC

## 2021-03-18 MED ORDER — INSULIN ASPART 100 UNIT/ML IJ SOLN
0.0000 [IU] | Freq: Three times a day (TID) | INTRAMUSCULAR | Status: DC
  Administered 2021-03-18: 11 [IU] via SUBCUTANEOUS

## 2021-03-18 MED ORDER — INSULIN GLARGINE-YFGN 100 UNIT/ML ~~LOC~~ SOLN: 10.0000 [IU] | Freq: Every morning | SUBCUTANEOUS | Status: DC

## 2021-03-18 MED ORDER — HYDROXYZINE HCL 25 MG PO TABS
25.0000 mg | ORAL_TABLET | Freq: Three times a day (TID) | ORAL | Status: DC | PRN
  Administered 2021-03-18: 25 mg via ORAL
  Filled 2021-03-18: qty 1

## 2021-03-18 MED ORDER — INSULIN GLARGINE-YFGN 100 UNIT/ML ~~LOC~~ SOLN
30.0000 [IU] | Freq: Once | SUBCUTANEOUS | Status: DC
  Filled 2021-03-18: qty 0.3

## 2021-03-18 MED ORDER — TRAZODONE HCL 100 MG PO TABS
100.0000 mg | ORAL_TABLET | Freq: Every day | ORAL | Status: DC
  Administered 2021-03-18: 100 mg via ORAL
  Filled 2021-03-18: qty 1

## 2021-03-18 MED ORDER — MUPIROCIN 2 % EX OINT
1.0000 "application " | TOPICAL_OINTMENT | Freq: Two times a day (BID) | CUTANEOUS | Status: DC
  Administered 2021-03-18 – 2021-03-19 (×3): 1 via TOPICAL
  Filled 2021-03-18: qty 22

## 2021-03-18 MED ORDER — INSULIN GLARGINE-YFGN 100 UNIT/ML ~~LOC~~ SOLN
30.0000 [IU] | Freq: Every morning | SUBCUTANEOUS | Status: DC
  Administered 2021-03-18: 30 [IU] via SUBCUTANEOUS
  Filled 2021-03-18 (×2): qty 0.3

## 2021-03-18 MED ORDER — IBUPROFEN 800 MG PO TABS
800.0000 mg | ORAL_TABLET | Freq: Three times a day (TID) | ORAL | Status: DC
  Administered 2021-03-18 – 2021-03-19 (×3): 800 mg via ORAL
  Filled 2021-03-18 (×3): qty 1

## 2021-03-18 MED ORDER — INSULIN ASPART 100 UNIT/ML IJ SOLN: 0.0000 [IU] | INTRAMUSCULAR | Status: DC

## 2021-03-18 MED ORDER — INSULIN ASPART 100 UNIT/ML IJ SOLN
0.0000 [IU] | INTRAMUSCULAR | Status: DC
  Filled 2021-03-18: qty 1

## 2021-03-18 MED ORDER — INSULIN GLARGINE-YFGN 100 UNIT/ML ~~LOC~~ SOLN
10.0000 [IU] | Freq: Every morning | SUBCUTANEOUS | Status: DC
  Filled 2021-03-18: qty 0.1

## 2021-03-18 MED ORDER — NICOTINE 21 MG/24HR TD PT24
21.0000 mg | MEDICATED_PATCH | Freq: Once | TRANSDERMAL | Status: DC
  Administered 2021-03-18: 21 mg via TRANSDERMAL
  Filled 2021-03-18: qty 1

## 2021-03-18 MED ORDER — INSULIN GLARGINE 100 UNIT/ML SOLOSTAR PEN: 10.0000 [IU] | PEN_INJECTOR | Freq: Two times a day (BID) | SUBCUTANEOUS | Status: DC

## 2021-03-18 MED ORDER — GABAPENTIN 300 MG PO CAPS
300.0000 mg | ORAL_CAPSULE | Freq: Three times a day (TID) | ORAL | Status: DC
  Administered 2021-03-18 – 2021-03-19 (×4): 300 mg via ORAL
  Filled 2021-03-18 (×4): qty 1

## 2021-03-18 MED ORDER — TRIAMCINOLONE ACETONIDE 0.1 % EX CREA
1.0000 "application " | TOPICAL_CREAM | Freq: Two times a day (BID) | CUTANEOUS | Status: DC
  Administered 2021-03-18 – 2021-03-19 (×3): 1 via TOPICAL
  Filled 2021-03-18: qty 15

## 2021-03-18 NOTE — ED Notes (Signed)
Vol/pending admission to BMU in the AM

## 2021-03-18 NOTE — ED Provider Notes (Signed)
[facility] Provider Note    Event Date/Time   First MD Initiated Contact with Patient 03/18/21 0045     (approximate)   History   Psychiatric Evaluation   HPI  Julie Lambert is a 56 y.o. female history of diabetes presents to the ER for evaluation of passive suicidal ideation making comments stating that she is not certain as to whether or not she wants to live.  Does not have a plan.  States that she has been using alcohol and cocaine quite frequently since her mother passed away in 2022/07/31.  She is having trouble sleeping describes anhedonia.  She denies any chest pain no shortness of breath no nausea or vomiting.  States that she has been noncompliant with her medications this week.  She denies ever having issues with alcohol withdrawal.     Physical Exam   Triage Vital Signs: ED Triage Vitals  Enc Vitals Group     BP 03/18/21 0015 (!) 177/106     Pulse Rate 03/18/21 0015 (!) 109     Resp 03/18/21 0015 20     Temp 03/18/21 0015 98.1 F (36.7 C)     Temp Source 03/18/21 0015 Oral     SpO2 03/18/21 0015 96 %     Weight 03/18/21 0016 178 lb 9.2 oz (81 kg)     Height 03/18/21 0016 5\' 7"  (1.702 m)     Head Circumference --      Peak Flow --      Pain Score 03/18/21 0015 0     Pain Loc --      Pain Edu? --      Excl. in [place]? --     Most recent vital signs: Vitals:   03/18/21 0015  BP: (!) 177/106  Pulse: (!) 109  Resp: 20  Temp: 98.1 F (36.7 C)  SpO2: 96%     Constitutional: Alert  Eyes: Conjunctivae are normal.  Head: Atraumatic. Nose: No congestion/rhinnorhea. Mouth/Throat: Mucous membranes are moist.   Neck: Painless ROM.  Cardiovascular:   Good peripheral circulation. Respiratory: Normal respiratory effort.  No retractions.  Gastrointestinal: Soft and nontender.  Musculoskeletal:  no deformity Neurologic:  MAE spontaneously. No gross focal neurologic deficits are appreciated.  Skin:  Skin is warm, dry and intact. No rash  noted. Psychiatric: calm and cooperative, sad    ED Results / Procedures / Treatments   Labs (all labs ordered are listed, but only abnormal results are displayed) Labs Reviewed  COMPREHENSIVE METABOLIC PANEL - Abnormal; Notable for the following components:      Result Value   Sodium 133 (*)    CO2 17 (*)    Glucose, Bld 382 (*)    AST 14 (*)    All other components within normal limits  ETHANOL - Abnormal; Notable for the following components:   Alcohol, Ethyl (B) 13 (*)    All other components within normal limits  SALICYLATE LEVEL - Abnormal; Notable for the following components:   Salicylate Lvl <3.8 (*)    All other components within normal limits  ACETAMINOPHEN LEVEL - Abnormal; Notable for the following components:   Acetaminophen (Tylenol), Serum <10 (*)    All other components within normal limits  CBC - Abnormal; Notable for the following components:   WBC 10.8 (*)    All other components within normal limits  RESP PANEL BY RT-PCR (FLU A&B, COVID) ARPGX2  URINE DRUG SCREEN, QUALITATIVE ([facility] ONLY)     EKG  RADIOLOGY    PROCEDURES:  Critical Care performed:   Procedures   MEDICATIONS ORDERED IN ED: Medications  insulin glargine-yfgn (SEMGLEE) injection 30 Units (has no administration in time range)     IMPRESSION / MDM / ASSESSMENT AND PLAN / ED COURSE  I reviewed the triage vital signs and the nursing notes.                              Differential diagnosis includes, but is not limited to, Psychosis, delirium, medication effect, noncompliance, polysubstance abuse, Si, Hi, depression  for evaluation of depression and SI.  Patient has psych history of substance abuse.  Laboratory testing was ordered to evaluation for underlying electrolyte derangement or signs of underlying organic pathology to explain today's presentation.  Based on history and physical and laboratory evaluation, it appears that the patient's presentation is 2/2 underlying  psychiatric disorder and will require further evaluation and management by inpatient psychiatry.  She is here voluntarily.  Consulted psychiatry who is evaluated patient and is recommending inpatient management.  Review of her blood work she does have hyper glycemia but reports noncompliance with her medications.  We will reorder her home regimen.  Does not appear consistent with HHS or DKA.  Reorder home meds.Marland Kitchen       FINAL CLINICAL IMPRESSION(S) / ED DIAGNOSES   Final diagnoses:  Depression, unspecified depression type  Suicidal ideation     Rx / DC Orders   ED Discharge Orders     None        Note:  This document was prepared using Dragon voice recognition software and may include unintentional dictation errors.    Merlyn Lot, MD 03/18/21 [phone]

## 2021-03-18 NOTE — ED Notes (Addendum)
Pt requested shower; provided clean hospital clothing and linens.  Shower setup provided with soap, shampoo, toothbrush/toothpaste, and deoderant.  Pt able to preform own ADL's with no assistance.  Cont to monitor as ordered

## 2021-03-18 NOTE — ED Notes (Signed)
Lunch provided; pt sitting up on recliner with no distress noted at this time.

## 2021-03-18 NOTE — ED Notes (Signed)
Pt reports that she takes long acting insulin both in the AM and at night. States that she takes 10U in the morning and 30U in the evening. Will contact diabetes coordinator.

## 2021-03-18 NOTE — Progress Notes (Signed)
Inpatient Diabetes Program Recommendations  AACE/ADA: New Consensus Statement on Inpatient Glycemic Control   Target Ranges:  Prepandial:   less than 140 mg/dL      Peak postprandial:   less than 180 mg/dL (1-2 hours)      Critically ill patients:  140 - 180 mg/dL    Latest Reference Range & Units 03/18/21 00:24  CO2 22 - 32 mmol/L 17 (L)  Glucose 70 - 99 mg/dL 382 (H)  Anion gap 5 - 15  13    Latest Reference Range & Units 03/18/21 00:24 03/18/21 02:06  Alcohol, Ethyl (B) <10 mg/dL 13 (H)   Cocaine Metabolite,Ur [place] NONE DETECTED   POSITIVE !   Review of Glycemic Control  Diabetes history: DM2 Outpatient Diabetes medications: Lantus 10 units QAM, Lantus 30 units QPM Current orders for Inpatient glycemic control: Semglee 10 units QAM (to start 03/19/21), Semglee 30 units QHS  Inpatient Diabetes Program Recommendations:    Insulin: Please consider ordering BMET to assess for acidosis. If patient has DKA, will need IV insulin ordered. If not in DKA, please consider ordering Semglee 10 units QAM to start now, ordering CBGs AC&HS, and Novolog 0-15 units AC&HS.  Diet: Please discontinue Regular diet and order Carb Modified diet.  Thanks, Barnie Alderman, RN, MSN, CDE Diabetes Coordinator Inpatient Diabetes Program [phone] (Team Pager from 8am to 5pm)

## 2021-03-18 NOTE — ED Notes (Signed)
Declined shower. 

## 2021-03-18 NOTE — ED Notes (Signed)
Pt requesting shower. Toiletries given and pt assisted to restroom.

## 2021-03-18 NOTE — ED Notes (Signed)
Patient reports she is still grieving the loss of her mother last June.  Reports she feels like others aren't letting her grieve the way she wants to.  Patient reports she has started reusing alcohol and cocaine.  States "I don't want to keep living like this".

## 2021-03-18 NOTE — ED Notes (Signed)
Per pts request they received a cold try and two chocolate milks

## 2021-03-18 NOTE — ED Notes (Signed)
Pt up to restroom with Toney Sang. No distress noted.

## 2021-03-18 NOTE — Consult Note (Signed)
[facility] Consult   Reason for Consult:  Psych evaluation Referring Physician:  DR. Quentin Cornwall Patient Identification: Julie Lambert. [place] MRN:  [id number] Principal Diagnosis: Suicidal ideation Diagnosis:  Principal Problem:   Suicidal ideation Active Problems:   Severe alcohol use disorder ([place])   Total Time spent with patient: 45 minutes  Subjective:  " I really need help"  HPI:  Per EDP, Julie Lambert is a 56 y.o. female history of diabetes presents to the ER for evaluation of passive suicidal ideation making comments stating that she is not certain as to whether or not she wants to live.  Does not have a plan.  States that she has been using alcohol and cocaine quite frequently since her mother passed away in 2022/08/02.  She is having trouble sleeping describes anhedonia.  She denies any chest pain no shortness of breath no nausea or vomiting.  States that she has been noncompliant with her medications this week.  She denies ever having issues with alcohol withdrawal.  During evaluation Julie Lambert is sitting in the hall chair eating. During interview she is tearful and states that she feels hopeless.  She states that she recently relapsed on cocaine and alcohol.  She states that she lost her mother to a heart attack suddenly 6 months ago. She states that he mother was her best friend. Since the death, she says she has been struggling mentally and has often felt the urge to kill herself. She says she has lost everything and is currently living with her aunt. She states that she cries everyday and cant get her "mind together".  She has a hx of bipolar disorder and is currently prescribed lamictal. She has an appt with a psychiatrist on February 13th.     At this time Julie Lambert is educated and verbalizes understanding of mental health resources and other crisis services in the community. She understands that the recommendation is inpatient hospitalization.   She agrees and is willing to sign voluntary paperwork.    Past Psychiatric History: bipolar disorder  Risk to Self:   Risk to Others:   Prior Inpatient Therapy:   Prior Outpatient Therapy:    Past Medical History:  Past Medical History:  Diagnosis Date   Bipolar 2 disorder ([place])    Diabetes mellitus without complication ([place])    Diverticulitis    Diverticulosis    GERD (gastroesophageal reflux disease)    Sciatica    Substance abuse ([place])    Uterine fibroid     Past Surgical History:  Procedure Laterality Date   BACK SURGERY     CHOLECYSTECTOMY     LUMBAR SPINE SURGERY     Family History:  Family History  Problem Relation Age of Onset   CAD Mother    Diabetes Mother    Heart disease Mother    Parkinson's disease Mother    Kidney disease Mother    Cancer Father        prostate cancer   Breast cancer Maternal Grandmother 36   Breast cancer Cousin 53   Breast cancer Cousin 41   Breast cancer Cousin 31   Breast cancer Cousin 64   Family Psychiatric  History: unknown Social History:  Social History   Substance and Sexual Activity  Alcohol Use Not Currently   Comment: a few times month/year     Social History   Substance and Sexual Activity  Drug Use Not Currently   Types: "Crack" cocaine   Comment: last  use 7/22, smoked crack    Social History   Socioeconomic History   Marital status: Divorced    Spouse name: Not on file   Number of children: Not on file   Years of education: Not on file   Highest education level: Not on file  Occupational History   Not on file  Tobacco Use   Smoking status: Every Day    Packs/day: 0.25    Years: 40.00    Pack years: 10.00    Types: Cigarettes   Smokeless tobacco: Never  Vaping Use   Vaping Use: Never used  Substance and Sexual Activity   Alcohol use: Not Currently    Comment: a few times month/year   Drug use: Not Currently    Types: "Crack" cocaine    Comment: last use 7/22, smoked crack   Sexual  activity: Yes    Partners: Male    Birth control/protection: Condom, Post-menopausal  Other Topics Concern   Not on file  Social History Narrative   Not on file   Social Determinants of Health   Financial Resource Strain: Not on file  Food Insecurity: Not on file  Transportation Needs: Not on file  Physical Activity: Not on file  Stress: Not on file  Social Connections: Not on file   Additional Social History:    Allergies:   Allergies  Allergen Reactions   Ivp Dye [Iodinated Contrast Media] Itching    Patient stated she had itching on her back after IV injection    Labs:  Results for orders placed or performed during the hospital encounter of 03/18/21 (from the past 48 hour(s))  Comprehensive metabolic panel     Status: Abnormal   Collection Time: 03/18/21 12:24 AM  Result Value Ref Range   Sodium 133 (L) 135 - 145 mmol/L   Potassium 3.9 3.5 - 5.1 mmol/L   Chloride 103 98 - 111 mmol/L   CO2 17 (L) 22 - 32 mmol/L   Glucose, Bld 382 (H) 70 - 99 mg/dL    Comment: Glucose reference range applies only to samples taken after fasting for at least 8 hours.   BUN 10 6 - 20 mg/dL   Creatinine, Ser 0.70 0.44 - 1.00 mg/dL   Calcium 9.4 8.9 - 10.3 mg/dL   Total Protein 7.5 6.5 - 8.1 g/dL   Albumin 3.9 3.5 - 5.0 g/dL   AST 14 (L) 15 - 41 U/L   ALT 15 0 - 44 U/L   Alkaline Phosphatase 121 38 - 126 U/L   Total Bilirubin 0.5 0.3 - 1.2 mg/dL   GFR, Estimated >60 >60 mL/min    Comment: (NOTE) Calculated using the CKD-EPI Creatinine Equation (2021)    Anion gap 13 5 - 15    Comment: Performed at [facility], [address] [place], [place] [address]  Ethanol     Status: Abnormal   Collection Time: 03/18/21 12:24 AM  Result Value Ref Range   Alcohol, Ethyl (B) 13 (H) <10 mg/dL    Comment: (NOTE) Lowest detectable limit for serum alcohol is 10 mg/dL.  For medical purposes only. Performed at [facility], [address] [place], [place] [address]    Salicylate level     Status: Abnormal   Collection Time: 03/18/21 12:24 AM  Result Value Ref Range   Salicylate Lvl <9.3 (L) 7.0 - 30.0 mg/dL    Comment: Performed at [name], [address], [place],  [address]  Acetaminophen level  Status: Abnormal   Collection Time: 03/18/21 12:24 AM  Result Value Ref Range   Acetaminophen (Tylenol), Serum <10 (L) 10 - 30 ug/mL    Comment: (NOTE) Therapeutic concentrations vary significantly. A range of 10-30 ug/mL  may be an effective concentration for many patients. However, some  are best treated at concentrations outside of this range. Acetaminophen concentrations >150 ug/mL at 4 hours after ingestion  and >50 ug/mL at 12 hours after ingestion are often associated with  toxic reactions.  Performed at [facility], [address] [place], [place] [address]   cbc     Status: Abnormal   Collection Time: 03/18/21 12:24 AM  Result Value Ref Range   WBC 10.8 (H) 4.0 - 10.5 K/uL   RBC 5.00 3.87 - 5.11 MIL/uL   Hemoglobin 14.5 12.0 - 15.0 g/dL   HCT 43.0 36.0 - 46.0 %   MCV 86.0 80.0 - 100.0 fL   MCH 29.0 26.0 - 34.0 pg   MCHC 33.7 30.0 - 36.0 g/dL   RDW 12.8 11.5 - 15.5 %   Platelets 316 150 - 400 K/uL   nRBC 0.0 0.0 - 0.2 %    Comment: Performed at [facility], [address] [place],  [address]  Resp Panel by RT-PCR (Flu A&B, Covid) Nasopharyngeal Swab     Status: None   Collection Time: 03/18/21  1:10 AM   Specimen: Nasopharyngeal Swab; Nasopharyngeal(NP) swabs in vial transport medium  Result Value Ref Range   SARS Coronavirus 2 by RT PCR NEGATIVE NEGATIVE    Comment: (NOTE) SARS-CoV-2 target nucleic acids are NOT DETECTED.  The SARS-CoV-2 RNA is generally detectable in upper respiratory specimens during the acute phase of infection. The lowest concentration of SARS-CoV-2 viral copies this assay can detect is 138 copies/mL. A negative result does not preclude  SARS-Cov-2 infection and should not be used as the sole basis for treatment or other patient management decisions. A negative result may occur with  improper specimen collection/handling, submission of specimen other than nasopharyngeal swab, presence of viral mutation(s) within the areas targeted by this assay, and inadequate number of viral copies(<138 copies/mL). A negative result must be combined with clinical observations, patient history, and epidemiological information. The expected result is Negative.  Fact Sheet for Patients:  EntrepreneurPulse.com.au  Fact Sheet for Healthcare Providers:  IncredibleEmployment.be  This test is no t yet approved or cleared by the [place] FDA and  has been authorized for detection and/or diagnosis of SARS-CoV-2 by FDA under an Emergency Use Authorization (EUA). This EUA will remain  in effect (meaning this test can be used) for the duration of the COVID-19 declaration under Section 564(b)(1) of the Act, 21 U.S.C.section 360bbb-3(b)(1), unless the authorization is terminated  or revoked sooner.       Influenza A by PCR NEGATIVE NEGATIVE   Influenza B by PCR NEGATIVE NEGATIVE    Comment: (NOTE) The Xpert Xpress SARS-CoV-2/FLU/RSV plus assay is intended as an aid in the diagnosis of influenza from Nasopharyngeal swab specimens and should not be used as a sole basis for treatment. Nasal washings and aspirates are unacceptable for Xpert Xpress SARS-CoV-2/FLU/RSV testing.  Fact Sheet for Patients: EntrepreneurPulse.com.au  Fact Sheet for Healthcare Providers: IncredibleEmployment.be  This test is not yet approved or cleared by the [place] FDA and has been authorized for detection and/or diagnosis of SARS-CoV-2 by FDA under an Emergency Use Authorization (EUA). This EUA will remain in effect (meaning this test can be used) for  the duration of the COVID-19  declaration under Section 564(b)(1) of the Act, 21 U.S.C. section 360bbb-3(b)(1), unless the authorization is terminated or revoked.  Performed at [facility], [address] [place], [place] [address]   Urine Drug Screen, Qualitative     Status: Abnormal   Collection Time: 03/18/21  2:06 AM  Result Value Ref Range   Tricyclic, Ur Screen NONE DETECTED NONE DETECTED   Amphetamines, Ur Screen NONE DETECTED NONE DETECTED   MDMA (Ecstasy)Ur Screen NONE DETECTED NONE DETECTED   Cocaine Metabolite,Ur [place] POSITIVE (A) NONE DETECTED   Opiate, Ur Screen NONE DETECTED NONE DETECTED   Phencyclidine (PCP) Ur S NONE DETECTED NONE DETECTED   Cannabinoid 50 Ng, Ur [place] NONE DETECTED NONE DETECTED   Barbiturates, Ur Screen NONE DETECTED NONE DETECTED   Benzodiazepine, Ur Scrn NONE DETECTED NONE DETECTED   Methadone Scn, Ur NONE DETECTED NONE DETECTED    Comment: (NOTE) Tricyclics + metabolites, urine    Cutoff 1000 ng/mL Amphetamines + metabolites, urine  Cutoff 1000 ng/mL MDMA (Ecstasy), urine              Cutoff 500 ng/mL Cocaine Metabolite, urine          Cutoff 300 ng/mL Opiate + metabolites, urine        Cutoff 300 ng/mL Phencyclidine (PCP), urine         Cutoff 25 ng/mL Cannabinoid, urine                 Cutoff 50 ng/mL Barbiturates + metabolites, urine  Cutoff 200 ng/mL Benzodiazepine, urine              Cutoff 200 ng/mL Methadone, urine                   Cutoff 300 ng/mL  The urine drug screen provides only a preliminary, unconfirmed analytical test result and should not be used for non-medical purposes. Clinical consideration and professional judgment should be applied to any positive drug screen result due to possible interfering substances. A more specific alternate chemical method must be used in order to obtain a confirmed analytical result. Gas chromatography / mass spectrometry (GC/MS) is the preferred confirm atory method. Performed at [facility], [address] [place], [place] [address]     Current Facility-Administered Medications  Medication Dose Route Frequency Provider Last Rate Last Admin   insulin glargine-yfgn [facility]) injection 30 Units  30 Units Subcutaneous Once Merlyn Lot, MD       Current Outpatient Medications  Medication Sig Dispense Refill   gabapentin (NEURONTIN) 300 MG capsule Take 300 mg by mouth 3 (three) times daily.     GNP ATHLETES FOOT 1 % cream Apply 1 application topically 2 (two) times daily.     hydrOXYzine (VISTARIL) 25 MG capsule Take 25 mg by mouth 3 (three) times daily as needed.     ibuprofen (ADVIL) 800 MG tablet Take 800 mg by mouth 3 (three) times daily.     lamoTRIgine (LAMICTAL) 100 MG tablet Take 100 mg by mouth daily.     LANTUS SOLOSTAR 100 UNIT/ML Solostar Pen Inject 10-30 Units into the skin 2 (two) times daily. Inject subcutaneously  10 units every morning and 30 units every evening.     mupirocin ointment (BACTROBAN) 2 % Apply 1 application topically 2 (two) times daily.     traZODone (DESYREL) 100 MG tablet Take 1 tablet by mouth once daily at bedtime as needed 30 tablet 1  triamcinolone cream (KENALOG) 0.1 % Apply 1 application topically 2 (two) times daily.     dicyclomine (BENTYL) 10 MG capsule Take 1 capsule (10 mg total) by mouth 3 (three) times daily as needed for up to 14 days for spasms. 8 capsule 0   DULoxetine (CYMBALTA) 30 MG capsule TAKE ONE CAPSULE BY MOUTH EVERY DAY FOR 7 DAYS. THEN TAKE 2 EVERY DAY THEREAFTER. 60 capsule 1   methocarbamol (ROBAXIN) 500 MG tablet TAKE 1 TO 2 TABLETS BY MOUTH ONCE EVERY 6 HOURS AS NEEDED FOR MUSCLE SPASMS. 15 tablet 0   ondansetron (ZOFRAN) 4 MG tablet Take 1 tablet (4 mg total) by mouth daily as needed. 14 tablet 0   predniSONE (DELTASONE) 10 MG tablet TAKE 6 TABLETS BY MOUTH ONCE DAILY TODAY. THEN DECREASE BY ONE TABLET EACH DAY UNTIL FINISHED. 21 tablet 0    Musculoskeletal: Strength & Muscle Tone: within normal limits Gait &  Station: normal Patient leans: N/A            Psychiatric Specialty Exam:  Presentation  General Appearance: Appropriate for Environment  Eye Contact:Fair  Speech:Clear and Coherent  Speech Volume:Decreased  Handedness:Right   Mood and Affect  Mood:Depressed; Hopeless; Worthless  Affect:Congruent; Depressed; Tearful   Thought Process  Thought Processes:Coherent  Descriptions of Associations:Intact  Orientation:Full (Time, Place and Person)  Thought Content:Logical  History of Schizophrenia/Schizoaffective disorder:No data recorded Duration of Psychotic Symptoms:No data recorded Hallucinations:Hallucinations: None  Ideas of Reference:None  Suicidal Thoughts:Suicidal Thoughts: Yes, Passive  Homicidal Thoughts:Homicidal Thoughts: No   Sensorium  Memory:Immediate Fair  Judgment:Poor  Insight:Poor   Executive Functions  Concentration:Fair  Attention Span:Fair  [place]   Psychomotor Activity  Psychomotor Activity:Psychomotor Activity: Normal   Assets  Assets:Communication Skills; Desire for Improvement; Housing; Financial Resources/Insurance   Sleep  Sleep:Sleep: Fair   Physical Exam: Physical Exam Vitals and nursing note reviewed.  HENT:     Nose: Nose normal.     Mouth/Throat:     Mouth: Mucous membranes are moist.  Eyes:     Pupils: Pupils are equal, round, and reactive to light.  Pulmonary:     Effort: Pulmonary effort is normal.  Genitourinary:    Rectum: Guaiac result negative.  Musculoskeletal:        General: Normal range of motion.     Cervical back: Normal range of motion.  Skin:    General: Skin is warm and dry.  Neurological:     General: No focal deficit present.     Mental Status: She is alert and oriented to person, place, and time.  Psychiatric:        Attention and Perception: Attention and perception normal.        Mood and Affect: Mood and affect normal.         Speech: Speech normal.        Behavior: Behavior normal.        Thought Content: Thought content normal.        Cognition and Memory: Cognition and memory normal.        Judgment: Judgment is impulsive and inappropriate.   Review of Systems  Psychiatric/Behavioral:  Positive for depression, substance abuse and suicidal ideas. Negative for hallucinations.   All other systems reviewed and are negative. Blood pressure (!) 177/106, pulse (!) 109, temperature 98.1 F (36.7 C), temperature source Oral, resp. rate 20, height 5\' 7"  (1.702 m), weight 81 kg, last menstrual period 07/22/2016, SpO2 96 %. Body mass  index is 27.97 kg/m.  Treatment Plan Summary: Daily contact with patient to assess and evaluate symptoms and progress in treatment  Disposition: Recommend psychiatric Inpatient admission when medically cleared. Supportive therapy provided about ongoing stressors. Discussed crisis plan, support from social network, calling 911, coming to the Emergency Department, and calling Suicide Hotline.  Deloria Lair, NP 03/18/2021 3:46 AM

## 2021-03-18 NOTE — ED Notes (Addendum)
Breakfast and diet ginger ale provided. Pt sitting up in recliner chair with no distress noted at this time.

## 2021-03-18 NOTE — ED Notes (Addendum)
PATIENT PROVIDED WITH DINNER TRAY PATIENT CALM AND COOPERATIVE, TRASH PROPERLY DISPOSED

## 2021-03-18 NOTE — ED Triage Notes (Signed)
Pt presents to ER voluntary for psych eval.  Pt states she "doesn't know if I want to live or not."  Pt states she has been relapsing and drinking appx a 12 pack of beer and has been doing cocaine 3x/day since her mother passed away.  Pt states she has been feeling very depressed.  Pt states she has had some thoughts of self harm, but denies plan.  Pt very tearful in triage.  Pt A&O x4 at this time.

## 2021-03-18 NOTE — BH Assessment (Signed)
There is a bed available at the [facility]  They will accept patient in the am

## 2021-03-18 NOTE — ED Notes (Signed)
Pt belongings:  Black tights Black shirt Grey socks Beige bra Beige pants Black and multi-colored sweater Black coat Black and white purse Black and green knee brace White shoes  Pt calm and dressed out with no issue

## 2021-03-19 ENCOUNTER — Inpatient Hospital Stay
Admission: AD | Admit: 2021-03-19 | Discharge: 2021-03-20 | DRG: 885 | Disposition: A | Discharge status: Home / Self Care | Payer: 59 | Source: Intra-hospital | Attending: Psychiatry | Admitting: Psychiatry

## 2021-03-19 ENCOUNTER — Other Ambulatory Visit: Payer: Self-pay

## 2021-03-19 ENCOUNTER — Encounter: Payer: Self-pay | Admitting: Psychiatry

## 2021-03-19 DIAGNOSIS — F332 Major depressive disorder, recurrent severe without psychotic features: Secondary | ICD-10-CM | POA: Diagnosis present

## 2021-03-19 DIAGNOSIS — R45851 Suicidal ideations: Secondary | ICD-10-CM | POA: Diagnosis present

## 2021-03-19 DIAGNOSIS — F4010 Social phobia, unspecified: Secondary | ICD-10-CM | POA: Diagnosis present

## 2021-03-19 DIAGNOSIS — F1020 Alcohol dependence, uncomplicated: Secondary | ICD-10-CM | POA: Diagnosis present

## 2021-03-19 DIAGNOSIS — G4700 Insomnia, unspecified: Secondary | ICD-10-CM | POA: Diagnosis present

## 2021-03-19 DIAGNOSIS — F17210 Nicotine dependence, cigarettes, uncomplicated: Secondary | ICD-10-CM | POA: Diagnosis present

## 2021-03-19 DIAGNOSIS — F1420 Cocaine dependence, uncomplicated: Secondary | ICD-10-CM | POA: Diagnosis present

## 2021-03-19 DIAGNOSIS — B354 Tinea corporis: Secondary | ICD-10-CM | POA: Diagnosis present

## 2021-03-19 DIAGNOSIS — E119 Type 2 diabetes mellitus without complications: Secondary | ICD-10-CM | POA: Diagnosis present

## 2021-03-19 LAB — GLUCOSE, CAPILLARY
Glucose-Capillary: 295 mg/dL — ABNORMAL HIGH (ref 70–99)
Glucose-Capillary: 209 mg/dL — ABNORMAL HIGH (ref 70–99)
Glucose-Capillary: 239 mg/dL — ABNORMAL HIGH (ref 70–99)

## 2021-03-19 LAB — CBG MONITORING, ED: Glucose-Capillary: 303 mg/dL — ABNORMAL HIGH (ref 70–99)

## 2021-03-19 MED ORDER — HYDROXYZINE HCL 25 MG PO TABS
25.0000 mg | ORAL_TABLET | Freq: Three times a day (TID) | ORAL | Status: DC | PRN
  Administered 2021-03-19 (×2): 25 mg via ORAL
  Filled 2021-03-19 (×2): qty 1

## 2021-03-19 MED ORDER — INSULIN ASPART 100 UNIT/ML IJ SOLN
3.0000 [IU] | Freq: Three times a day (TID) | INTRAMUSCULAR | Status: DC
  Administered 2021-03-19 – 2021-03-20 (×4): 3 [IU] via SUBCUTANEOUS
  Filled 2021-03-19 (×4): qty 1

## 2021-03-19 MED ORDER — LAMOTRIGINE 100 MG PO TABS
100.0000 mg | ORAL_TABLET | Freq: Every day | ORAL | Status: DC
  Administered 2021-03-20: 100 mg via ORAL
  Filled 2021-03-19: qty 1

## 2021-03-19 MED ORDER — INSULIN GLARGINE-YFGN 100 UNIT/ML ~~LOC~~ SOLN
30.0000 [IU] | Freq: Every day | SUBCUTANEOUS | Status: DC
  Administered 2021-03-19: 30 [IU] via SUBCUTANEOUS
  Filled 2021-03-19 (×2): qty 0.3

## 2021-03-19 MED ORDER — GABAPENTIN 300 MG PO CAPS
300.0000 mg | ORAL_CAPSULE | Freq: Three times a day (TID) | ORAL | Status: DC
  Administered 2021-03-19 – 2021-03-20 (×4): 300 mg via ORAL
  Filled 2021-03-19 (×4): qty 1

## 2021-03-19 MED ORDER — INSULIN ASPART 100 UNIT/ML IJ SOLN
0.0000 [IU] | Freq: Three times a day (TID) | INTRAMUSCULAR | Status: DC
  Administered 2021-03-19: 5 [IU] via SUBCUTANEOUS
  Administered 2021-03-19: 8 [IU] via SUBCUTANEOUS
  Administered 2021-03-20: 2 [IU] via SUBCUTANEOUS
  Administered 2021-03-20: 11 [IU] via SUBCUTANEOUS
  Filled 2021-03-19 (×4): qty 1

## 2021-03-19 MED ORDER — CLOTRIMAZOLE 1 % EX CREA
1.0000 "application " | TOPICAL_CREAM | Freq: Two times a day (BID) | CUTANEOUS | Status: DC
  Administered 2021-03-19 – 2021-03-20 (×2): 1 via TOPICAL
  Filled 2021-03-19: qty 15

## 2021-03-19 MED ORDER — INSULIN GLARGINE-YFGN 100 UNIT/ML ~~LOC~~ SOLN
10.0000 [IU] | Freq: Every morning | SUBCUTANEOUS | Status: DC
  Administered 2021-03-20: 10 [IU] via SUBCUTANEOUS
  Filled 2021-03-19: qty 0.1

## 2021-03-19 MED ORDER — INSULIN GLARGINE-YFGN 100 UNIT/ML ~~LOC~~ SOLN: 35.0000 [IU] | Freq: Every morning | SUBCUTANEOUS | Status: DC

## 2021-03-19 MED ORDER — INSULIN ASPART 100 UNIT/ML IJ SOLN
0.0000 [IU] | Freq: Every day | INTRAMUSCULAR | Status: DC
  Administered 2021-03-19: 2 [IU] via SUBCUTANEOUS
  Filled 2021-03-19: qty 1

## 2021-03-19 MED ORDER — ALUM & MAG HYDROXIDE-SIMETH 200-200-20 MG/5ML PO SUSP: 30.0000 mL | ORAL | Status: DC | PRN

## 2021-03-19 MED ORDER — INSULIN ASPART 100 UNIT/ML IJ SOLN
3.0000 [IU] | Freq: Three times a day (TID) | INTRAMUSCULAR | Status: DC
  Administered 2021-03-19: 3 [IU] via SUBCUTANEOUS
  Filled 2021-03-19: qty 1

## 2021-03-19 MED ORDER — TRIAMCINOLONE 0.1 % CREAM:EUCERIN CREAM 1:1
TOPICAL_CREAM | Freq: Two times a day (BID) | CUTANEOUS | Status: DC
  Filled 2021-03-19: qty 1

## 2021-03-19 MED ORDER — MAGNESIUM HYDROXIDE 400 MG/5ML PO SUSP
30.0000 mL | Freq: Every day | ORAL | Status: DC | PRN
  Administered 2021-03-19: 30 mL via ORAL
  Filled 2021-03-19: qty 30

## 2021-03-19 MED ORDER — INSULIN GLARGINE-YFGN 100 UNIT/ML ~~LOC~~ SOLN
35.0000 [IU] | Freq: Every morning | SUBCUTANEOUS | Status: DC
  Administered 2021-03-19: 35 [IU] via SUBCUTANEOUS
  Filled 2021-03-19: qty 0.35

## 2021-03-19 MED ORDER — ACETAMINOPHEN 325 MG PO TABS
650.0000 mg | ORAL_TABLET | Freq: Four times a day (QID) | ORAL | Status: DC | PRN
  Administered 2021-03-19: 650 mg via ORAL
  Filled 2021-03-19: qty 2

## 2021-03-19 MED ORDER — MUPIROCIN 2 % EX OINT
1.0000 "application " | TOPICAL_OINTMENT | Freq: Two times a day (BID) | CUTANEOUS | Status: DC
  Administered 2021-03-19 – 2021-03-20 (×2): 1 via TOPICAL
  Filled 2021-03-19: qty 22

## 2021-03-19 MED ORDER — TRAZODONE HCL 100 MG PO TABS
100.0000 mg | ORAL_TABLET | Freq: Every day | ORAL | Status: DC
  Administered 2021-03-19: 100 mg via ORAL
  Filled 2021-03-19: qty 1
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
## 2021-03-19 NOTE — Progress Notes (Signed)
Inpatient Diabetes Program Recommendations  AACE/ADA: New Consensus Statement on Inpatient Glycemic Control  Target Ranges:  Prepandial:   less than 140 mg/dL      Peak postprandial:   less than 180 mg/dL (1-2 hours)      Critically ill patients:  140 - 180 mg/dL    Latest Reference Range & Units 03/18/21 08:52 03/18/21 12:11 03/18/21 15:41 03/18/21 16:49 03/18/21 19:08 03/18/21 21:36  Glucose-Capillary 70 - 99 mg/dL 341 (H) 262 (H) 159 (H) 248 (H) 260 (H) 300 (H)    Review of Glycemic Control  Diabetes history: DM2 Outpatient Diabetes medications: Lantus 10 units QAM, Lantus 30 units QPM Current orders for Inpatient glycemic control: Semglee 30 units daily, Novolog 0-15 units TID with meals, Novolog 0-5 units QHS  Inpatient Diabetes Program Recommendations:    Insulin: Please consider increasing Semglee to 35 units daily and ordering Novolog 3 units TID with meals for meal coverage if patient eats at least 50% of meals.  Thanks, Barnie Alderman, RN, MSN, CDE Diabetes Coordinator Inpatient Diabetes Program [phone] (Team Pager from 8am to 5pm)

## 2021-03-19 NOTE — Tx Team (Signed)
Initial Treatment Plan 03/19/2021 11:22 AM Julie Lambert [id number]    PATIENT STRESSORS: Loss of mother   Medication change or noncompliance   Substance abuse   Traumatic event     PATIENT STRENGTHS: Average or above average intelligence  Capable of independent living  Communication skills  Physical Health    PATIENT IDENTIFIED PROBLEMS: Unexpected loss of mother  Substance abuse                   DISCHARGE CRITERIA:  Ability to meet basic life and health needs Adequate post-discharge living arrangements Medical problems require only outpatient monitoring Verbal commitment to aftercare and medication compliance  PRELIMINARY DISCHARGE PLAN: Attend aftercare/continuing care group Return to previous living arrangement Return to previous work or school arrangements  PATIENT/FAMILY INVOLVEMENT: This treatment plan has been presented to and reviewed with the patient, Julie Lambert, and/or family member,  The patient and family have been given the opportunity to ask questions and make suggestions.  Merlene Morse, RN 03/19/2021, 11:22 AM

## 2021-03-19 NOTE — ED Provider Notes (Signed)
Emergency Medicine Observation Re-evaluation Note  Revecca L. Lambert is a 56 y.o. female, seen on rounds today.  Pt initially presented to the ED for complaints of Psychiatric Evaluation Currently, the patient is resting, voices no medical complaints.  Physical Exam  BP 100/67 (BP Location: Left Arm)    Pulse 96    Temp 98.6 F (37 C)    Resp 16    Ht 5\' 7"  (1.702 m)    Wt 81 kg    LMP 07/22/2016 (Approximate)    SpO2 98%    BMI 27.97 kg/m  Physical Exam General: Resting in no acute distress Cardiac: No cyanosis Lungs: Equal rise and fall Psych: Not agitated  ED Course / MDM  EKG:   I have reviewed the labs performed to date as well as medications administered while in observation.  Recent changes in the last 24 hours include no events overnight.  Plan  Current plan is for psychiatric disposition.  Julie Lambert is not under involuntary commitment.     Julie Blanch, MD 03/19/21 [phone]

## 2021-03-19 NOTE — ED Notes (Signed)
Breakfast placed at bedside. 

## 2021-03-19 NOTE — Group Note (Signed)
LCSW Group Therapy Note  Group Date: 03/19/2021 Start Time: 1300 End Time: 1400   Type of Therapy and Topic:  Group Therapy - Healthy vs Unhealthy Coping Skills  Participation Level:  Did Not Attend   Description of Group The focus of this group was to determine what unhealthy coping techniques typically are used by group members and what healthy coping techniques would be helpful in coping with various problems. Patients were guided in becoming aware of the differences between healthy and unhealthy coping techniques. Patients were asked to identify 2-3 healthy coping skills they would like to learn to use more effectively.  Therapeutic Goals Patients learned that coping is what human beings do all day long to deal with various situations in their lives Patients defined and discussed healthy vs unhealthy coping techniques Patients identified their preferred coping techniques and identified whether these were healthy or unhealthy Patients determined 2-3 healthy coping skills they would like to become more familiar with and use more often. Patients provided support and ideas to each other   Summary of Patient Progress:  Patient did not attend group despite encouraged participation.    Therapeutic Modalities Cognitive Behavioral Therapy Motivational Interviewing  Larose Kells 03/19/2021  2:13 PM

## 2021-03-19 NOTE — Progress Notes (Signed)
Patient ID: Julie Lambert, female   DOB: [birth year], 56 y.o.   MRN: [id number]  Admission note: Patient is a 56 year old female, presents voluntary per report of severe recurrent major depression without psychotic features. Patient is alert and oriented to unit. Patients affect is depressed. Patient is cooperative during assessment questions. Patient presents with bilateral skin condition on legs. Patient rates pain 8/10 for chronic knee and leg pain.  Patient identified major stressor as the passing of her mother in June of 2022. Patient very tearful when she brought up her mother. Patient also identified current living situation with an aunt as a stressor. Patient is cooperative during assessment. Patient states that she relapsed a few months following the passing of her mother. Patient states she "just wants to get back on track". Patient currently denies SI/HI/AVH. Unit policies explained and verbalized understanding. Q15 minute checks maintained and will continue to monitor.

## 2021-03-19 NOTE — ED Provider Notes (Signed)
Reviewed the recommendations by the diabetes coordinator, Semglee changed to 35 units and added on 3 units of NovoLog with meals in addition to the existing sliding scale.   Rada Hay, MD 03/19/21 [phone]

## 2021-03-19 NOTE — Progress Notes (Signed)
°   03/19/21 1400  Clinical Encounter Type  Visited With Health care provider  Visit Type Initial  Referral From Physician  Consult/Referral To Chaplain  Spiritual Encounters  Spiritual Needs Other (Comment) (not assessed)   Chaplain Burris attempted to visit with Pt but they were engaged with Dr. Weber Cooks at this time. Chaplain Burris inquired with staff to gain their insight into Pt needs and to communicate intention to f/u with Pt. Daryel November will be on-call Wednesday 2/8 and will f/u if unable to visit today.
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
## 2021-03-19 NOTE — H&P (Signed)
Psychiatric Admission Assessment Adult  Patient Identification: Julie Lambert MRN:  [id number] Date of Evaluation:  03/19/2021 Chief Complaint:  Severe recurrent major depression without psychotic features ([place]) [F33.2] Principal Diagnosis: Severe recurrent major depression without psychotic features ([place]) Diagnosis:  Principal Problem:   Severe recurrent major depression without psychotic features ([place]) Active Problems:   Diabetes mellitus without complication (HCC)   Severe alcohol use disorder ([place])   Cocaine use disorder, moderate, dependence ([place])  History of Present Illness: Patient seen and chart reviewed.  56 year old woman with a history of depression and substance abuse presented voluntarily to the emergency room requesting help.  On interview the patient is initially tearful and also appears very angry having just got off the telephone with her aunt.  Patient tells me she has been living with her aunt since last 05-Aug-2022 when her mother passed away.  She says she is still grieving her mother and has not been able to get over it.  She has also relapsed into drinking.  The drinking is usually binging once a week and also using cocaine regularly.  She says her mood feels depressed and angry and hopeless.  Sleep pattern is chaotic.  Not eating well.  Suicidal thoughts without specific plan.  Denies psychotic symptoms.  Not currently receiving any outpatient mental health treatment and not involved in any substance abuse treatment.  She does say that she has been taking medication in the past but cannot remember what it is.  She claims she has been taking care of her diabetes but admits that it is only loosely being cared for Associated Signs/Symptoms: Depression Symptoms:  depressed mood, insomnia, fatigue, feelings of worthlessness/guilt, difficulty concentrating, hopelessness, suicidal thoughts without plan, Duration of Depression Symptoms: No data recorded (Hypo) Manic Symptoms:   Distractibility, Impulsivity, Irritable Mood, Labiality of Mood, Anxiety Symptoms:  Excessive Worry, Psychotic Symptoms:   Nonspecific PTSD Symptoms: Negative Total Time spent with patient: 1 hour  Past Psychiatric History: Patient has a past history of depression possible bipolar depression and also substance abuse.  Has had prior hospitalizations here as well as presentations to the emergency room.  In the past she has been treated with antidepressants such as Cymbalta.  She says most recently she is on Lamictal and also thinks she is on a different medicine but cannot remember what it is.  We called the [facility] where she gets her medicine and they declined to tell us what she was taking.  Is the patient at risk to self? Yes.    Has the patient been a risk to self in the past 6 months? Yes.    Has the patient been a risk to self within the distant past? Yes.    Is the patient a risk to others? No.  Has the patient been a risk to others in the past 6 months? No.  Has the patient been a risk to others within the distant past? No.   Prior Inpatient Therapy:   Prior Outpatient Therapy:    Alcohol Screening: Patient refused Alcohol Screening Tool: Yes 1. How often do you have a drink containing alcohol?: 2 to 3 times a week 2. How many drinks containing alcohol do you have on a typical day when you are drinking?: 3 or 4 3. How often do you have six or more drinks on one occasion?: Less than monthly AUDIT-C Score: 5 4. How often during the last year have you found that you were not able to stop drinking  once you had started?: Never 5. How often during the last year have you failed to do what was normally expected from you because of drinking?: Less than monthly 6. How often during the last year have you needed a first drink in the morning to get yourself going after a heavy drinking session?: Never 7. How often during the last year have you had a feeling of guilt of  remorse after drinking?: Never 8. How often during the last year have you been unable to remember what happened the night before because you had been drinking?: Less than monthly 9. Have you or someone else been injured as a result of your drinking?: No 10. Has a relative or friend or a doctor or another health worker been concerned about your drinking or suggested you cut down?: No Alcohol Use Disorder Identification Test Final Score (AUDIT): 7 Substance Abuse History in the last 12 months:  Yes.   Consequences of Substance Abuse: Medical self-care problems as well as worsening of depression Previous Psychotropic Medications: Yes  Psychological Evaluations: Yes  Past Medical History:  Past Medical History:  Diagnosis Date   Bipolar 2 disorder ([place])    Diabetes mellitus without complication ([place])    Diverticulitis    Diverticulosis    GERD (gastroesophageal reflux disease)    Sciatica    Substance abuse ([place])    Uterine fibroid     Past Surgical History:  Procedure Laterality Date   BACK SURGERY     CHOLECYSTECTOMY     LUMBAR SPINE SURGERY     Family History:  Family History  Problem Relation Age of Onset   CAD Mother    Diabetes Mother    Heart disease Mother    Parkinson's disease Mother    Kidney disease Mother    Cancer Father        prostate cancer   Breast cancer Maternal Grandmother 72   Breast cancer Cousin 76   Breast cancer Cousin 71   Breast cancer Cousin 92   Breast cancer Cousin 44   Family Psychiatric  History: Substance abuse Tobacco Screening:   Social History:  Social History   Substance and Sexual Activity  Alcohol Use Not Currently   Comment: a few times month/year     Social History   Substance and Sexual Activity  Drug Use Not Currently   Types: "Crack" cocaine   Comment: last use 7/22, smoked crack    Additional Social History:                           Allergies:   Allergies  Allergen Reactions   Ivp Dye [Iodinated  Contrast Media] Itching    Patient stated she had itching on her back after IV injection   Lab Results:  Results for orders placed or performed during the hospital encounter of 03/19/21 (from the past 48 hour(s))  Glucose, capillary     Status: Abnormal   Collection Time: 03/19/21 11:31 AM  Result Value Ref Range   Glucose-Capillary 295 (H) 70 - 99 mg/dL    Comment: Glucose reference range applies only to samples taken after fasting for at least 8 hours.    Blood Alcohol level:  Lab Results  Component Value Date   ETH 13 (H) 03/18/2021   ETH 30 (H) 79/03/4095    Metabolic Disorder Labs:  Lab Results  Component Value Date   HGBA1C 11.4 (H) 03/18/2021   MPG 280.48 03/18/2021  MPG 240.3 03/17/2019   No results found for: PROLACTIN Lab Results  Component Value Date   CHOL 253 (H) 03/19/2019   TRIG 256 (H) 03/19/2019   HDL 72 03/19/2019   CHOLHDL 3.5 03/19/2019   VLDL 51 (H) 03/19/2019   LDLCALC 130 (H) 03/19/2019   LDLCALC 39 03/19/2018    Current Medications: Current Facility-Administered Medications  Medication Dose Route Frequency Provider Last Rate Last Admin   acetaminophen (TYLENOL) tablet 650 mg  650 mg Oral Q6H PRN Diego Delancey, [name], MD       alum & mag hydroxide-simeth (MAALOX/MYLANTA) 200-200-20 MG/5ML suspension 30 mL  30 mL Oral Q4H PRN Christelle Igoe, [name], MD       clotrimazole (LOTRIMIN) 1 % cream 1 application  1 application Topical BID Kemond Amorin, [name], MD       gabapentin (NEURONTIN) capsule 300 mg  300 mg Oral TID Xylan Sheils, [name], MD   300 mg at 03/19/21 1251   hydrOXYzine (ATARAX) tablet 25 mg  25 mg Oral TID PRN Isbella Arline, [name], MD   25 mg at 03/19/21 1251   insulin aspart (novoLOG) injection 0-15 Units  0-15 Units Subcutaneous TID WC Filbert Craze, [name], MD   8 Units at 03/19/21 1251   insulin aspart (novoLOG) injection 0-5 Units  0-5 Units Subcutaneous QHS Kaitelyn Jamison T, MD       insulin aspart (novoLOG) injection 3 Units  3 Units Subcutaneous TID WC Tegan Britain,  [name], MD   3 Units at 03/19/21 1252   [START ON 03/20/2021] insulin glargine-yfgn (SEMGLEE) injection 10 Units  10 Units Subcutaneous q AM Purvi Ruehl T, MD       insulin glargine-yfgn (SEMGLEE) injection 30 Units  30 Units Subcutaneous QHS Christeen Lai, [name], MD       Derrill Memo ON 03/20/2021] lamoTRIgine (LAMICTAL) tablet 100 mg  100 mg Oral Daily Jayleana Colberg T, MD       magnesium hydroxide (MILK OF MAGNESIA) suspension 30 mL  30 mL Oral Daily PRN Kary Colaizzi, [name], MD       mupirocin ointment (BACTROBAN) 2 % 1 application  1 application Topical BID Michole Lecuyer, [name], MD       traZODone (DESYREL) tablet 100 mg  100 mg Oral QHS Cieanna Stormes T, MD       triamcinolone 0.1 % cream : eucerin cream, 1:1   Topical BID Esaiah Wanless, [name], MD       PTA Medications: Medications Prior to Admission  Medication Sig Dispense Refill Last Dose   dicyclomine (BENTYL) 10 MG capsule Take 1 capsule (10 mg total) by mouth 3 (three) times daily as needed for up to 14 days for spasms. 8 capsule 0    DULoxetine (CYMBALTA) 30 MG capsule TAKE ONE CAPSULE BY MOUTH EVERY DAY FOR 7 DAYS. THEN TAKE 2 EVERY DAY THEREAFTER. 60 capsule 1    gabapentin (NEURONTIN) 300 MG capsule Take 300 mg by mouth 3 (three) times daily.      GNP ATHLETES FOOT 1 % cream Apply 1 application topically 2 (two) times daily.      hydrOXYzine (VISTARIL) 25 MG capsule Take 25 mg by mouth 3 (three) times daily as needed.      ibuprofen (ADVIL) 800 MG tablet Take 800 mg by mouth 3 (three) times daily.      lamoTRIgine (LAMICTAL) 100 MG tablet Take 100 mg by mouth daily.      LANTUS SOLOSTAR 100 UNIT/ML Solostar Pen Inject 10-30 Units into the skin  2 (two) times daily. Inject subcutaneously  10 units every morning and 30 units every evening.      mupirocin ointment (BACTROBAN) 2 % Apply 1 application topically 2 (two) times daily.      traZODone (DESYREL) 100 MG tablet Take 1 tablet by mouth once daily at bedtime as needed 30 tablet 1    triamcinolone cream  (KENALOG) 0.1 % Apply 1 application topically 2 (two) times daily.       Musculoskeletal: Strength & Muscle Tone: within normal limits Gait & Station: normal Patient leans: N/A            Psychiatric Specialty Exam:  Presentation  General Appearance: Appropriate for Environment  Eye Contact:Fair  Speech:Clear and Coherent  Speech Volume:Decreased  Handedness:Right   Mood and Affect  Mood:Depressed; Hopeless; Worthless  Affect:Congruent; Depressed; Tearful   Thought Process  Thought Processes:Coherent  Duration of Psychotic Symptoms: No data recorded Past Diagnosis of Schizophrenia or Psychoactive disorder: No data recorded Descriptions of Associations:Intact  Orientation:Full (Time, Place and Person)  Thought Content:Logical  Hallucinations:Hallucinations: None  Ideas of Reference:None  Suicidal Thoughts:Suicidal Thoughts: Yes, Passive  Homicidal Thoughts:Homicidal Thoughts: No   Sensorium  Memory:Immediate Fair  Judgment:Poor  Insight:Poor   Executive Functions  Concentration:Fair  Attention Span:Fair  [place]   Psychomotor Activity  Psychomotor Activity:Psychomotor Activity: Normal   Assets  Assets:Communication Skills; Desire for Improvement; Housing; Financial Resources/Insurance   Sleep  Sleep:Sleep: Fair    Physical Exam: Physical Exam Nursing note reviewed.  Constitutional:      Appearance: Normal appearance.  HENT:     Head: Normocephalic and atraumatic.     Mouth/Throat:     Pharynx: Oropharynx is clear.  Eyes:     Pupils: Pupils are equal, round, and reactive to light.  Cardiovascular:     Rate and Rhythm: Normal rate and regular rhythm.  Pulmonary:     Effort: Pulmonary effort is normal.     Breath sounds: Normal breath sounds.  Abdominal:     General: Abdomen is flat.     Palpations: Abdomen is soft.  Musculoskeletal:        General: Normal range of  motion.  Skin:    General: Skin is warm and dry.  Neurological:     General: No focal deficit present.     Mental Status: She is alert. Mental status is at baseline.  Psychiatric:        Attention and Perception: She is inattentive.        Mood and Affect: Mood is anxious and depressed.        Speech: Speech is tangential.        Behavior: Behavior is agitated.        Thought Content: Thought content normal.        Cognition and Memory: Memory is impaired.        Judgment: Judgment is impulsive.   Review of Systems  Constitutional: Negative.   HENT: Negative.    Eyes: Negative.   Respiratory: Negative.    Cardiovascular: Negative.   Gastrointestinal: Negative.   Musculoskeletal: Negative.   Skin: Negative.   Neurological: Negative.   Psychiatric/Behavioral:  Positive for depression, substance abuse and suicidal ideas. The patient is nervous/anxious.   Blood pressure 114/81, pulse 73, temperature 98.3 F (36.8 C), temperature source Oral, resp. rate 16, height 5\' 7"  (1.702 m), weight 82.6 kg, last menstrual period 07/22/2016, SpO2 93 %. Body mass index is 28.51 kg/m.  Treatment  Plan Summary: Medication management and Plan closely monitor blood sugars.  Monitor insulin usage.  Restarted lamotrigine.  Tried to discover what other medicine she might have been taking for depression but without success we will adjust as necessary.  Patient says she does not require detox as she never has withdrawal symptoms from alcohol.  Treatment team will meet with her tomorrow and discuss further goals.  Observation Level/Precautions:  15 minute checks  Laboratory:  UDS  Psychotherapy:    Medications:    Consultations:    Discharge Concerns:    Estimated LOS:  Other:     Physician Treatment Plan for Primary Diagnosis: Severe recurrent major depression without psychotic features ([place]) Long Term Goal(s): Improvement in symptoms so as ready for discharge  Short Term Goals: Ability to verbalize  feelings will improve and Ability to disclose and discuss suicidal ideas  Physician Treatment Plan for Secondary Diagnosis: Principal Problem:   Severe recurrent major depression without psychotic features ([place]) Active Problems:   Diabetes mellitus without complication (HCC)   Severe alcohol use disorder (HCC)   Cocaine use disorder, moderate, dependence ([place])  Long Term Goal(s): Improvement in symptoms so as ready for discharge  Short Term Goals: Compliance with prescribed medications will improve  I certify that inpatient services furnished can reasonably be expected to improve the patient's condition.    Alethia Berthold, MD 2/7/20233:07 PM

## 2021-03-19 NOTE — ED Notes (Signed)
Report to [place], [place]

## 2021-03-20 ENCOUNTER — Other Ambulatory Visit: Payer: Self-pay

## 2021-03-20 DIAGNOSIS — F332 Major depressive disorder, recurrent severe without psychotic features: Secondary | ICD-10-CM | POA: Diagnosis not present

## 2021-03-20 LAB — GLUCOSE, CAPILLARY
Glucose-Capillary: 315 mg/dL — ABNORMAL HIGH (ref 70–99)
Glucose-Capillary: 142 mg/dL — ABNORMAL HIGH (ref 70–99)

## 2021-03-20 MED ORDER — UNIFINE PENTIPS 31G X 5 MM MISC
0 refills | Status: DC
  Filled 2021-03-20: qty 40, 10d supply, fill #0

## 2021-03-20 MED ORDER — INSULIN ASPART 100 UNIT/ML IJ SOLN: 3.0000 [IU] | Freq: Three times a day (TID) | INTRAMUSCULAR | 1 refills | Status: DC

## 2021-03-20 MED ORDER — GLUCERNA SHAKE PO LIQD: 237.0000 mL | Freq: Two times a day (BID) | ORAL | Status: DC

## 2021-03-20 MED ORDER — HYDROXYZINE HCL 25 MG PO TABS
25.0000 mg | ORAL_TABLET | Freq: Three times a day (TID) | ORAL | 0 refills | Status: DC | PRN
  Filled 2021-03-20: qty 20, 7d supply, fill #0

## 2021-03-20 MED ORDER — INSULIN GLARGINE-YFGN 100 UNIT/ML ~~LOC~~ SOLN: 10.0000 [IU] | Freq: Every morning | SUBCUTANEOUS | 11 refills | Status: DC

## 2021-03-20 MED ORDER — GABAPENTIN 300 MG PO CAPS
300.0000 mg | ORAL_CAPSULE | Freq: Three times a day (TID) | ORAL | 0 refills | Status: DC
  Filled 2021-03-20: qty 30, 10d supply, fill #0

## 2021-03-20 MED ORDER — HYDROXYZINE HCL 25 MG PO TABS: 25.0000 mg | ORAL_TABLET | Freq: Three times a day (TID) | ORAL | 1 refills | Status: DC | PRN

## 2021-03-20 MED ORDER — LAMOTRIGINE 100 MG PO TABS
100.0000 mg | ORAL_TABLET | Freq: Every day | ORAL | 0 refills | Status: DC
  Filled 2021-03-20: qty 10, 10d supply, fill #0

## 2021-03-20 MED ORDER — ADULT MULTIVITAMIN W/MINERALS CH
1.0000 | ORAL_TABLET | Freq: Every day | ORAL | Status: DC
  Administered 2021-03-20: 1 via ORAL
  Filled 2021-03-20: qty 1

## 2021-03-20 MED ORDER — TRAZODONE HCL 100 MG PO TABS: 100.0000 mg | ORAL_TABLET | Freq: Every day | ORAL | 1 refills | Status: DC

## 2021-03-20 MED ORDER — TRAZODONE HCL 100 MG PO TABS
100.0000 mg | ORAL_TABLET | Freq: Every day | ORAL | 0 refills | Status: DC
  Filled 2021-03-20: qty 10, 10d supply, fill #0

## 2021-03-20 MED ORDER — INSULIN ASPART 100 UNIT/ML IJ SOLN
3.0000 [IU] | Freq: Three times a day (TID) | INTRAMUSCULAR | 0 refills | Status: DC
  Filled 2021-03-20: qty 10, 111d supply, fill #0

## 2021-03-20 MED ORDER — GABAPENTIN 300 MG PO CAPS: 300.0000 mg | ORAL_CAPSULE | Freq: Three times a day (TID) | ORAL | 1 refills | Status: DC

## 2021-03-20 MED ORDER — INSULIN GLARGINE-YFGN 100 UNIT/ML ~~LOC~~ SOPN
30.0000 [IU] | PEN_INJECTOR | Freq: Every day | SUBCUTANEOUS | 0 refills | Status: DC
  Filled 2021-03-20: qty 3, 10d supply, fill #0

## 2021-03-20 MED ORDER — INSULIN LISPRO (1 UNIT DIAL) 100 UNIT/ML (KWIKPEN)
3.0000 [IU] | PEN_INJECTOR | Freq: Three times a day (TID) | SUBCUTANEOUS | 0 refills | Status: DC
  Filled 2021-03-20: qty 3, 34d supply, fill #0

## 2021-03-20 MED ORDER — INSULIN GLARGINE-YFGN 100 UNIT/ML ~~LOC~~ SOLN
30.0000 [IU] | Freq: Every day | SUBCUTANEOUS | 0 refills | Status: DC
  Filled 2021-03-20: qty 10, 33d supply, fill #0

## 2021-03-20 MED ORDER — LAMOTRIGINE 100 MG PO TABS: 100.0000 mg | ORAL_TABLET | Freq: Every day | ORAL | 1 refills | Status: DC

## 2021-03-20 MED ORDER — INSULIN GLARGINE-YFGN 100 UNIT/ML ~~LOC~~ SOLN: 30.0000 [IU] | Freq: Every day | SUBCUTANEOUS | 1 refills | Status: DC

## 2021-03-20 MED ORDER — ENSURE MAX PROTEIN PO LIQD
11.0000 [oz_av] | Freq: Every day | ORAL | Status: DC
  Filled 2021-03-20: qty 330

## 2021-03-20 NOTE — Progress Notes (Signed)
Patient presents depressed and had a crying spell during assessment. Pt endorses anxiety and depression. Pt observed interacting appropriately with staff and peers on the unit. Pt compliant with medication administration per MD orders. Pt being monitored Q 15 minutes for safety per unit protocol. Pt remains safe on the unit.

## 2021-03-20 NOTE — BHH Counselor (Signed)
CSW met with the patient to go over discharge information. Patient reports that she is going to her aunt's home. She reports that a friend will provide transportation. She reports that she does NOT need CSW assistance in scheduling/confirming appointment.  Patient DECLINED CSW reaching out to [facility].  Assunta Curtis, MSW, LCSW 03/20/2021 10:40 AM

## 2021-03-20 NOTE — BHH Suicide Risk Assessment (Signed)
[facility] Discharge Suicide Risk Assessment   Principal Problem: Severe recurrent major depression without psychotic features [facility]) Discharge Diagnoses: Principal Problem:   Severe recurrent major depression without psychotic features ([place]) Active Problems:   Diabetes mellitus without complication (HCC)   Severe alcohol use disorder (HCC)   Cocaine use disorder, moderate, dependence ([place])   Total Time spent with patient: 30 minutes  Musculoskeletal: Strength & Muscle Tone: within normal limits Gait & Station: normal Patient leans: N/A  Psychiatric Specialty Exam  Presentation  General Appearance: Appropriate for Environment  Eye Contact:Fair  Speech:Clear and Coherent  Speech Volume:Decreased  Handedness:Right   Mood and Affect  Mood:Depressed; Hopeless; Worthless  Duration of Depression Symptoms: No data recorded Affect:Congruent; Depressed; Tearful   Thought Process  Thought Processes:Coherent  Descriptions of Associations:Intact  Orientation:Full (Time, Place and Person)  Thought Content:Logical  History of Schizophrenia/Schizoaffective disorder:No data recorded Duration of Psychotic Symptoms:No data recorded Hallucinations:No data recorded Ideas of Reference:None  Suicidal Thoughts:No data recorded Homicidal Thoughts:No data recorded  Sensorium  Memory:Immediate Fair  Judgment:Poor  Insight:Poor   Executive Functions  Concentration:Fair  Attention Span:Fair  [place]   Psychomotor Activity  Psychomotor Activity:No data recorded  Assets  Assets:Communication Skills; Desire for Improvement; Housing; Financial Resources/Insurance   Sleep  Sleep:No data recorded  Physical Exam: Physical Exam Vitals and nursing note reviewed.  Constitutional:      Appearance: Normal appearance.  HENT:     Head: Normocephalic and atraumatic.     Mouth/Throat:     Pharynx: Oropharynx is clear.  Eyes:      Pupils: Pupils are equal, round, and reactive to light.  Cardiovascular:     Rate and Rhythm: Normal rate and regular rhythm.  Pulmonary:     Effort: Pulmonary effort is normal.     Breath sounds: Normal breath sounds.  Abdominal:     General: Abdomen is flat.     Palpations: Abdomen is soft.  Musculoskeletal:        General: Normal range of motion.  Skin:    General: Skin is warm and dry.  Neurological:     General: No focal deficit present.     Mental Status: She is alert. Mental status is at baseline.  Psychiatric:        Attention and Perception: Attention normal.        Mood and Affect: Mood normal. Affect is tearful.        Speech: Speech normal.        Behavior: Behavior normal.        Thought Content: Thought content normal.        Cognition and Memory: Cognition normal.        Judgment: Judgment is impulsive.   Review of Systems  Constitutional: Negative.   HENT: Negative.    Eyes: Negative.   Respiratory: Negative.    Cardiovascular: Negative.   Gastrointestinal: Negative.   Musculoskeletal: Negative.   Skin: Negative.   Neurological: Negative.   Psychiatric/Behavioral:  Positive for depression. Negative for hallucinations, memory loss, substance abuse and suicidal ideas. The patient is nervous/anxious. The patient does not have insomnia.   Blood pressure 125/84, pulse 89, temperature 98.5 F (36.9 C), temperature source Oral, resp. rate 18, height 5\' 7"  (1.702 m), weight 82.6 kg, last menstrual period 07/22/2016, SpO2 100 %. Body mass index is 28.51 kg/m.  Mental Status Per Nursing Assessment::   On Admission:  NA  Demographic Factors:  Low socioeconomic status  Loss Factors:  Financial problems/change in socioeconomic status  Historical Factors: Impulsivity  Risk Reduction Factors:   Employed, Living with another person, especially a relative, and Positive therapeutic relationship  Continued Clinical Symptoms:  Depression:    Impulsivity Alcohol/Substance Abuse/Dependencies  Cognitive Features That Contribute To Risk:  None    Suicide Risk:  Minimal: No identifiable suicidal ideation.  Patients presenting with no risk factors but with morbid ruminations; may be classified as minimal risk based on the severity of the depressive symptoms   Follow-up [place], [facility] Follow up.   Why: You report your appointment at [facility] is on 03/25/2021 after 3pm. Contact information: [address] [place] [address] [phone]                 Plan Of Care/Follow-up recommendations:  Patient denies any suicidal thought.  Her behavior is currently calm and not aggressive no agitation.  She is tearful at times but says she feels like it is best for her to be discharged back home.  She is agreeable to plans for outpatient mental health and substance abuse treatment.  Alethia Berthold, MD 03/20/2021, 10:47 AM

## 2021-03-20 NOTE — Progress Notes (Signed)
°  [facility] Adult Case Management Discharge Plan :  Will you be returning to the same living situation after discharge:  Yes,  pt reports that she is returning to her home with her aunt.  At discharge, do you have transportation home?: Yes,  pt reports that a peer will provide transportation. Do you have the ability to pay for your medications: No.  Release of information consent forms completed and in the chart;  Patient's signature needed at discharge.  Patient to Follow up at:  Follow-up [place], [facility] Follow up.   Why: You report your appointment at [facility] is on 03/25/2021 after 3pm. Contact information: [address] [place] [address] [phone]                 Next level of care provider has access to University and Suicide Prevention discussed: No.  SPE completed with the patient.      Has patient been referred to the Quitline?: Patient refused referral  Patient has been referred for addiction treatment: Pt. refused referral  Rozann Lesches, LCSW 03/20/2021, 10:43 AM

## 2021-03-20 NOTE — Progress Notes (Signed)
NUTRITION ASSESSMENT  Pt identified as at risk on the Malnutrition Screen Tool  INTERVENTION:  -MVI with minerals daily -Glucerna Shake po BID, each supplement provides 220 kcal and 10 grams of protein -Ensure Max po daily, each supplement provides 150 kcal and 30 grams of protein.     NUTRITION DIAGNOSIS: Unintentional weight loss related to sub-optimal intake as evidenced by pt report.   Goal: Pt to meet >/= 90% of their estimated nutrition needs.  Monitor:  PO intake  Assessment:   56 year old woman with a history of depression and substance abuse presented voluntarily to the emergency room requesting help.  Pt admitted with sever recurrent major depression.   Pt is currently on a carb modified diet. No meal completion data currently available to assess.   Per H&P, pt was taking cocaine daily and binge drinking at least weekly.   Reviewed wt hx; wt has been stable over the past 11 months.   Lab Results  Component Value Date   HGBA1C 11.4 (H) 03/18/2021   PTA DM medications are 10 units lantus solostar in the morning and 30 units lantus solostar daily.   Labs reviewed: CBGS: 159-315 (inpatient orders for glycemic control are 0-15 units insulin aspart TID with meals, 0-5 units insulin aspart daily at bedtime, 3 units insulin aspart TID with meals, 10 units insulin glargine-yfgn every morning, and 30 units insulin glargine-yfgn daily at bedtime).    56 y.o. female  Height: Ht Readings from Last 1 Encounters:  03/19/21 5\' 7"  (1.702 m)    Weight: Wt Readings from Last 1 Encounters:  03/19/21 82.6 kg    Weight Hx: Wt Readings from Last 10 Encounters:  03/19/21 82.6 kg  03/18/21 81 kg  09/29/20 81.6 kg  08/28/20 81.6 kg  05/14/20 81.6 kg  04/23/20 81.6 kg  01/28/20 79.4 kg  12/06/19 79.4 kg  11/22/19 81.6 kg  10/12/19 81.6 kg    BMI:  Body mass index is 28.51 kg/m.  Estimated Nutritional Needs: Kcal: 25-30 kcal/kg Protein: > 1 gram protein/kg Fluid: 1  ml/kcal  Diet Order:  Diet Order             Diet Carb Modified Fluid consistency: Thin; Room service appropriate? Yes  Diet effective now                  Pt is also offered choice of unit snacks mid-morning and mid-afternoon.  Pt is eating as desired.   Lab results and medications reviewed.   Loistine Chance, RD, LDN, [place] Registered Dietitian II Certified Diabetes Care and Education Specialist Please refer to [facility] for RD and/or RD on-call/weekend/after hours pager

## 2021-03-20 NOTE — BH IP Treatment Plan (Signed)
Interdisciplinary Treatment and Diagnostic Plan Update  03/20/2021 Time of Session: 9:30AM Julie Lambert MRN: [id number]  Principal Diagnosis: Severe recurrent major depression without psychotic features ([place])  Secondary Diagnoses: Principal Problem:   Severe recurrent major depression without psychotic features ([place]) Active Problems:   Diabetes mellitus without complication ([place])   Severe alcohol use disorder (HCC)   Cocaine use disorder, moderate, dependence ([place])   Current Medications:  Current Facility-Administered Medications  Medication Dose Route Frequency Provider Last Rate Last Admin   acetaminophen (TYLENOL) tablet 650 mg  650 mg Oral Q6H PRN Clapacs, [name], MD   650 mg at 03/19/21 2155   alum & mag hydroxide-simeth (MAALOX/MYLANTA) 200-200-20 MG/5ML suspension 30 mL  30 mL Oral Q4H PRN Clapacs, [name], MD       clotrimazole (LOTRIMIN) 1 % cream 1 application  1 application Topical BID Clapacs, [name], MD   1 application at 41/32/44 0819   feeding supplement (GLUCERNA SHAKE) (GLUCERNA SHAKE) liquid 237 mL  237 mL Oral BID BM Clapacs, [name], MD       gabapentin (NEURONTIN) capsule 300 mg  300 mg Oral TID Clapacs, [name], MD   300 mg at 03/20/21 0817   hydrOXYzine (ATARAX) tablet 25 mg  25 mg Oral TID PRN Clapacs, [name], MD   25 mg at 03/19/21 2148   insulin aspart (novoLOG) injection 0-15 Units  0-15 Units Subcutaneous TID WC Clapacs, [name], MD   11 Units at 03/20/21 0818   insulin aspart (novoLOG) injection 0-5 Units  0-5 Units Subcutaneous QHS Clapacs, [name], MD   2 Units at 03/19/21 2148   insulin aspart (novoLOG) injection 3 Units  3 Units Subcutaneous TID WC Clapacs, [name], MD   3 Units at 03/20/21 0817   insulin glargine-yfgn (SEMGLEE) injection 10 Units  10 Units Subcutaneous q AM Clapacs, [name], MD   10 Units at 03/20/21 0902   insulin glargine-yfgn (SEMGLEE) injection 30 Units  30 Units Subcutaneous QHS Clapacs, [name], MD   30 Units at 03/19/21 2149    lamoTRIgine (LAMICTAL) tablet 100 mg  100 mg Oral Daily Clapacs, [name], MD   100 mg at 03/20/21 0102   magnesium hydroxide (MILK OF MAGNESIA) suspension 30 mL  30 mL Oral Daily PRN Clapacs, [name], MD   30 mL at 03/19/21 2155   multivitamin with minerals tablet 1 tablet  1 tablet Oral Daily Clapacs, [name], MD       mupirocin ointment (BACTROBAN) 2 % 1 application  1 application Topical BID Clapacs, [name], MD   1 application at 72/53/66 4403   protein supplement (ENSURE MAX) liquid  11 oz Oral QHS Clapacs, [name], MD       traZODone (DESYREL) tablet 100 mg  100 mg Oral QHS Clapacs, [name], MD   100 mg at 03/19/21 2149   triamcinolone 0.1 % cream : eucerin cream, 1:1   Topical BID Clapacs, [name], MD   Given at 03/20/21 [phone]   PTA Medications: Medications Prior to Admission  Medication Sig Dispense Refill Last Dose   dicyclomine (BENTYL) 10 MG capsule Take 1 capsule (10 mg total) by mouth 3 (three) times daily as needed for up to 14 days for spasms. 8 capsule 0    DULoxetine (CYMBALTA) 30 MG capsule TAKE ONE CAPSULE BY MOUTH EVERY DAY FOR 7 DAYS. THEN TAKE 2 EVERY DAY THEREAFTER. 60 capsule 1    GNP ATHLETES FOOT 1 %  cream Apply 1 application topically 2 (two) times daily.      hydrOXYzine (VISTARIL) 25 MG capsule Take 25 mg by mouth 3 (three) times daily as needed.      ibuprofen (ADVIL) 800 MG tablet Take 800 mg by mouth 3 (three) times daily.      LANTUS SOLOSTAR 100 UNIT/ML Solostar Pen Inject 10-30 Units into the skin 2 (two) times daily. Inject subcutaneously  10 units every morning and 30 units every evening.      mupirocin ointment (BACTROBAN) 2 % Apply 1 application topically 2 (two) times daily.      traZODone (DESYREL) 100 MG tablet Take 1 tablet by mouth once daily at bedtime as needed 30 tablet 1    triamcinolone cream (KENALOG) 0.1 % Apply 1 application topically 2 (two) times daily.      [DISCONTINUED] gabapentin (NEURONTIN) 300 MG capsule Take 300 mg by mouth 3 (three) times  daily.      [DISCONTINUED] lamoTRIgine (LAMICTAL) 100 MG tablet Take 100 mg by mouth daily.       Patient Stressors: Loss of mother   Medication change or noncompliance   Substance abuse   Traumatic event    Patient Strengths: Average or above average intelligence  Capable of independent living  Communication skills  Physical Health   Treatment Modalities: Medication Management, Group therapy, Case management,  1 to 1 session with clinician, Psychoeducation, Recreational therapy.   Physician Treatment Plan for Primary Diagnosis: Severe recurrent major depression without psychotic features ([place]) Long Term Goal(s): Improvement in symptoms so as ready for discharge   Short Term Goals: Compliance with prescribed medications will improve Ability to verbalize feelings will improve Ability to disclose and discuss suicidal ideas  Medication Management: Evaluate patient's response, side effects, and tolerance of medication regimen.  Therapeutic Interventions: 1 to 1 sessions, Unit Group sessions and Medication administration.  Evaluation of Outcomes: Adequate for Discharge  Physician Treatment Plan for Secondary Diagnosis: Principal Problem:   Severe recurrent major depression without psychotic features ([place]) Active Problems:   Diabetes mellitus without complication (HCC)   Severe alcohol use disorder (HCC)   Cocaine use disorder, moderate, dependence ([place])  Long Term Goal(s): Improvement in symptoms so as ready for discharge   Short Term Goals: Compliance with prescribed medications will improve Ability to verbalize feelings will improve Ability to disclose and discuss suicidal ideas     Medication Management: Evaluate patient's response, side effects, and tolerance of medication regimen.  Therapeutic Interventions: 1 to 1 sessions, Unit Group sessions and Medication administration.  Evaluation of Outcomes: Adequate for Discharge   RN Treatment Plan for Primary Diagnosis:  Severe recurrent major depression without psychotic features ([place]) Long Term Goal(s): Knowledge of disease and therapeutic regimen to maintain health will improve  Short Term Goals: Ability to demonstrate self-control, Ability to participate in decision making will improve, Ability to verbalize feelings will improve, Ability to disclose and discuss suicidal ideas, Ability to identify and develop effective coping behaviors will improve, and Compliance with prescribed medications will improve  Medication Management: RN will administer medications as ordered by provider, will assess and evaluate patient's response and provide education to patient for prescribed medication. RN will report any adverse and/or side effects to prescribing provider.  Therapeutic Interventions: 1 on 1 counseling sessions, Psychoeducation, Medication administration, Evaluate responses to treatment, Monitor vital signs and CBGs as ordered, Perform/monitor CIWA, COWS, AIMS and Fall Risk screenings as ordered, Perform wound care treatments as ordered.  Evaluation of Outcomes: Adequate for  Discharge   LCSW Treatment Plan for Primary Diagnosis: Severe recurrent major depression without psychotic features ([place]) Long Term Goal(s): Safe transition to appropriate next level of care at discharge, Engage patient in therapeutic group addressing interpersonal concerns.  Short Term Goals: Engage patient in aftercare planning with referrals and resources, Increase social support, Increase ability to appropriately verbalize feelings, Increase emotional regulation, Facilitate acceptance of mental health diagnosis and concerns, Facilitate patient progression through stages of change regarding substance use diagnoses and concerns, Identify triggers associated with mental health/substance abuse issues, and Increase skills for wellness and recovery  Therapeutic Interventions: Assess for all discharge needs, 1 to 1 time with Social worker, Explore  available resources and support systems, Assess for adequacy in community support network, Educate family and significant other(s) on suicide prevention, Complete Psychosocial Assessment, Interpersonal group therapy.  Evaluation of Outcomes: Adequate for Discharge   Progress in Treatment: Attending groups: No. Participating in groups: No. Taking medication as prescribed: Yes. Toleration medication: Yes. Family/Significant other contact made: No, will contact:  once permission is given.  Patient understands diagnosis: Yes. Discussing patient identified problems/goals with staff: Yes. Medical problems stabilized or resolved: Yes. Denies suicidal/homicidal ideation: Yes. Issues/concerns per patient self-inventory: No. Other: none  New problem(s) identified: No, Describe:  none  New Short Term/Long Term Goal(s):  detox, medication management for mood stabilization; elimination of SI thoughts; development of comprehensive mental wellness/sobriety plan.   Patient Goals:  "I'm ready to go home"  Discharge Plan or Barriers: Patient reports plans to discharge to her aunts home.  She reports that she has a mental health appointment with [facility] on 03/25/2021.  She thinks the appointment is after 3PM, however is not sure.  She declined CSW assistance in scheduling the appointment or confirming the time.   Reason for Continuation of Hospitalization: Anxiety Depression Medication stabilization Suicidal ideation  Estimated Length of Stay:  1-7 days   Scribe for Treatment Team: Rozann Lesches, Marlinda Mike 03/20/2021 10:53 AM

## 2021-03-20 NOTE — BHH Counselor (Signed)
Adult Comprehensive Assessment  Patient ID: Julie Lambert, female   DOB: 1965-09-16, 56 y.o.   MRN: [id number]  Information Source: Information source: Patient  Current Stressors:  Patient states their primary concerns and needs for treatment are:: "I was tired and told the sheriff's I needed help." Patient states their goals for this hospitilization and ongoing recovery are:: "I'm ready to go"  Living/Environment/Situation:  Living Arrangements: Other relatives  Family History:     Childhood History:     Education:     Employment/Work Situation:      Museum/gallery curator Resources:      Alcohol/Substance Abuse:      Social Support System:      Leisure/Recreation:      Strengths/Needs:      Discharge Plan:      Summary/Recommendations:   Architectural technologist and Recommendations (to be completed by the evaluator): Patient is a 56 year old female from [place], [place]  She presents to the hospital with concerns for depression and passive suicidal ideations.  She also reports some substance use, alcohol and cocaine.  She identifies triggers as being the recent passing of her mother in June, a conflictual relationship with her aunt with whom she lives and loss of everything.  t recently came back to [place] to go to [place] and visit with her daughter.  She reports that she has an appointment for mental health scheduled for 03/25/2021.  Recommendations include: crisis stabilization, therapeutic milieu, encourage group attendance and participation, medication management for mood stabilization and development of comprehensive mental wellness.  Rozann Lesches. 03/20/2021

## 2021-03-20 NOTE — Progress Notes (Signed)
Patient ID: Julie Lambert, female   DOB: 1965-06-06, 56 y.o.   MRN: [id number]  Patient denies SI/HI/AVH. Belongings were returned to patient. Discharge instructions  including medication and follow up information were reviewed with patient and understanding was verbalized. Patient was not observed to be in distress at time of discharge. Patient was escorted out with staff to medical mall to be transported home by family member.  Seven day supply of medication unavailable at time of discharge. Patient agrees to come back tomorrow to pick up medication supply from the out patient pharmacy due to ride not being willing to wait. Supply delayed due to discrepancy between pharmacy and physician (medication orders were discontinued for reasons unknown to physician).

## 2021-03-20 NOTE — Discharge Summary (Signed)
Physician Discharge Summary Note  Patient:  Julie Lambert. Preston is an 56 y.o., female MRN:  [id number] DOB:  March 29, 1965 Patient phone:  [phone] (home)  Patient address:   [address] [place] [address],  Total Time spent with patient: 30 minutes  Date of Admission:  03/19/2021 Date of Discharge: 03/20/2021  Reason for Admission: Patient was admitted after presenting to the emergency room complaining of suicidal ideation and depressed mood ongoing substance abuse and desiring treatment  Principal Problem: Severe recurrent major depression without psychotic features ([place]) Discharge Diagnoses: Principal Problem:   Severe recurrent major depression without psychotic features ([place]) Active Problems:   Diabetes mellitus without complication (HCC)   Severe alcohol use disorder ([place])   Cocaine use disorder, moderate, dependence ([place])   Past Psychiatric History: Past history of substance abuse primarily alcohol and cocaine as well as chronic mood instability  Past Medical History:  Past Medical History:  Diagnosis Date   Bipolar 2 disorder ([place])    Diabetes mellitus without complication ([place])    Diverticulitis    Diverticulosis    GERD (gastroesophageal reflux disease)    Sciatica    Substance abuse ([place])    Uterine fibroid     Past Surgical History:  Procedure Laterality Date   BACK SURGERY     CHOLECYSTECTOMY     LUMBAR SPINE SURGERY     Family History:  Family History  Problem Relation Age of Onset   CAD Mother    Diabetes Mother    Heart disease Mother    Parkinson's disease Mother    Kidney disease Mother    Cancer Father        prostate cancer   Breast cancer Maternal Grandmother 18   Breast cancer Cousin 42   Breast cancer Cousin 50   Breast cancer Cousin 22   Breast cancer Cousin 45   Family Psychiatric  History: See previous Social History:  Social History   Substance and Sexual Activity  Alcohol Use Not Currently   Comment: a few times month/year      Social History   Substance and Sexual Activity  Drug Use Not Currently   Types: "Crack" cocaine   Comment: last use 7/22, smoked crack    Social History   Socioeconomic History   Marital status: Divorced    Spouse name: Not on file   Number of children: Not on file   Years of education: Not on file   Highest education level: Not on file  Occupational History   Not on file  Tobacco Use   Smoking status: Every Day    Packs/day: 0.25    Years: 40.00    Pack years: 10.00    Types: Cigarettes   Smokeless tobacco: Never  Vaping Use   Vaping Use: Never used  Substance and Sexual Activity   Alcohol use: Not Currently    Comment: a few times month/year   Drug use: Not Currently    Types: "Crack" cocaine    Comment: last use 7/22, smoked crack   Sexual activity: Yes    Partners: Male    Birth control/protection: Condom, Post-menopausal  Other Topics Concern   Not on file  Social History Narrative   Not on file   Social Determinants of Health   Financial Resource Strain: Not on file  Food Insecurity: Not on file  Transportation Needs: Not on file  Physical Activity: Not on file  Stress: Not on file  Social Connections: Not on file  Hospital Course: Patient admitted to psychiatric unit.  15-minute checks maintained.  Patient did not display any dangerous or aggressive behavior in the hospital.  Made no attempt to harm herself.  She was cooperative with some medication management changes.  Medicine tolerated well.  At the time of discharge she is requesting to be released from the hospital.  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  She does not appear to be psychotic and she understands the risks and benefits of discharge.  He is agreeable to continuing with outpatient mental health treatment including substance abuse treatment.  Prescriptions provided at discharge  Physical Findings: AIMS:  , ,  ,   ,    CIWA:    COWS:     Musculoskeletal: Strength & Muscle Tone: within normal limits Gait & Station: normal Patient leans: N/A   Psychiatric Specialty Exam:  Presentation  General Appearance: Appropriate for Environment  Eye Contact:Fair  Speech:Clear and Coherent  Speech Volume:Decreased  Handedness:Right   Mood and Affect  Mood:Depressed; Hopeless; Worthless  Affect:Congruent; Depressed; Tearful   Thought Process  Thought Processes:Coherent  Descriptions of Associations:Intact  Orientation:Full (Time, Place and Person)  Thought Content:Logical  History of Schizophrenia/Schizoaffective disorder:No data recorded Duration of Psychotic Symptoms:No data recorded Hallucinations:No data recorded Ideas of Reference:None  Suicidal Thoughts:No data recorded Homicidal Thoughts:No data recorded  Sensorium  Memory:Immediate Fair  Judgment:Poor  Insight:Poor   Executive Functions  Concentration:Fair  Attention Span:Fair  [place]   Psychomotor Activity  Psychomotor Activity:No data recorded  Assets  Assets:Communication Skills; Desire for Improvement; Housing; Financial Resources/Insurance   Sleep  Sleep:No data recorded   Physical Exam: Physical Exam Vitals and nursing note reviewed.  Constitutional:      Appearance: Normal appearance.  HENT:     Head: Normocephalic and atraumatic.     Mouth/Throat:     Pharynx: Oropharynx is clear.  Eyes:     Pupils: Pupils are equal, round, and reactive to light.  Cardiovascular:     Rate and Rhythm: Normal rate and regular rhythm.  Pulmonary:     Effort: Pulmonary effort is normal.     Breath sounds: Normal breath sounds.  Abdominal:     General: Abdomen is flat.     Palpations: Abdomen is soft.  Musculoskeletal:        General: Normal range of motion.  Skin:    General: Skin is warm and dry.  Neurological:     General: No focal deficit present.      Mental Status: She is alert. Mental status is at baseline.  Psychiatric:        Attention and Perception: Attention normal.        Mood and Affect: Mood normal. Affect is tearful.        Speech: Speech normal.        Behavior: Behavior is cooperative.        Thought Content: Thought content normal. Thought content does not include suicidal ideation.        Cognition and Memory: Cognition normal.        Judgment: Judgment is impulsive.   Review of Systems  Constitutional: Negative.   HENT: Negative.    Eyes: Negative.   Respiratory: Negative.    Cardiovascular: Negative.   Gastrointestinal: Negative.   Musculoskeletal: Negative.   Skin: Negative.   Neurological: Negative.   Psychiatric/Behavioral:  Positive for depression.  Negative for hallucinations, memory loss, substance abuse and suicidal ideas. The patient is not nervous/anxious and does not have insomnia.   Blood pressure 125/84, pulse 89, temperature 98.5 F (36.9 C), temperature source Oral, resp. rate 18, height 5\' 7"  (1.702 m), weight 82.6 kg, last menstrual period 07/22/2016, SpO2 100 %. Body mass index is 28.51 kg/m.   Social History   Tobacco Use  Smoking Status Every Day   Packs/day: 0.25   Years: 40.00   Pack years: 10.00   Types: Cigarettes  Smokeless Tobacco Never   Tobacco Cessation:  A prescription for an FDA-approved tobacco cessation medication was offered at discharge and the patient refused   Blood Alcohol level:  Lab Results  Component Value Date   ETH 13 (H) 03/18/2021   ETH 30 (H) 60/63/0160    Metabolic Disorder Labs:  Lab Results  Component Value Date   HGBA1C 11.4 (H) 03/18/2021   MPG 280.48 03/18/2021   MPG 240.3 03/17/2019   No results found for: PROLACTIN Lab Results  Component Value Date   CHOL 253 (H) 03/19/2019   TRIG 256 (H) 03/19/2019   HDL 72 03/19/2019   CHOLHDL 3.5 03/19/2019   VLDL 51 (H) 03/19/2019   LDLCALC 130 (H) 03/19/2019   LDLCALC 39 03/19/2018    See  Psychiatric Specialty Exam and Suicide Risk Assessment completed by Attending Physician prior to discharge.  Discharge destination:  Home  Is patient on multiple antipsychotic therapies at discharge:  No   Has Patient had three or more failed trials of antipsychotic monotherapy by history:  No  Recommended Plan for Multiple Antipsychotic Therapies: NA  Discharge Instructions     Diet - low sodium heart healthy   Complete by: As directed    Increase activity slowly   Complete by: As directed       Allergies as of 03/20/2021       Reactions   Ivp Dye [iodinated Contrast Media] Itching   Patient stated she had itching on her back after IV injection        Medication List     STOP taking these medications    dicyclomine 10 MG capsule Commonly known as: Bentyl   DULoxetine 30 MG capsule Commonly known as: CYMBALTA   hydrOXYzine 25 MG capsule Commonly known as: VISTARIL Replaced by: hydrOXYzine 25 MG tablet   ibuprofen 800 MG tablet Commonly known as: ADVIL   Lantus SoloStar 100 UNIT/ML Solostar Pen Generic drug: insulin glargine       TAKE these medications      Indication  gabapentin 300 MG capsule Commonly known as: NEURONTIN Take 1 capsule (300 mg total) by mouth 3 (three) times daily.  Indication: Social Anxiety Disorder   GNP Athletes Foot 1 % cream Generic drug: clotrimazole Apply 1 application topically 2 (two) times daily.  Indication: Ringworm of the Body   hydrOXYzine 25 MG tablet Commonly known as: ATARAX Take 1 tablet (25 mg total) by mouth 3 (three) times daily as needed for anxiety or itching. Replaces: hydrOXYzine 25 MG capsule  Indication: Feeling Anxious   insulin aspart 100 UNIT/ML injection Commonly known as: novoLOG Inject 3 Units into the skin 3 (three) times daily with meals.  Indication: Type 2 Diabetes   insulin glargine-yfgn 100 UNIT/ML injection Commonly known as: SEMGLEE Inject 0.3 mLs (30 Units total) into the skin at  bedtime.  Indication: Type 2 Diabetes   insulin glargine-yfgn 100 UNIT/ML injection Commonly known as: SEMGLEE Inject 0.1 mLs (10 Units total)  into the skin in the morning. Start taking on: March 21, 2021  Indication: Type 2 Diabetes   lamoTRIgine 100 MG tablet Commonly known as: LAMICTAL Take 1 tablet (100 mg total) by mouth daily.  Indication: Manic-Depression   mupirocin ointment 2 % Commonly known as: BACTROBAN Apply 1 application topically 2 (two) times daily.  Indication: Hair Follicle Inflammation   traZODone 100 MG tablet Commonly known as: DESYREL Take 1 tablet (100 mg total) by mouth at bedtime. What changed:  how much to take how to take this when to take this  Indication: Trouble Sleeping   triamcinolone cream 0.1 % Commonly known as: KENALOG Apply 1 application topically 2 (two) times daily.  Indication: Itching        Follow-up [place], [facility] Follow up.   Why: You report [facility] appointment at [facility] on 03/25/2021 after 3pm. Contact information: [address] [place] [address] [phone]                 Follow-up recommendations: Follow up with mental health facilities locally for substance abuse and mental health treatment.  Continue current medicine.  Comments: Prescriptions provided and medications ordered  Signed: Alethia Berthold, MD 03/20/2021, 10:53 AM

## 2021-03-20 NOTE — BHH Suicide Risk Assessment (Signed)
[place] INPATIENT:  Family/Significant Other Suicide Prevention Education  Suicide Prevention Education:  Patient Refusal for Family/Significant Other Suicide Prevention Education: The patient Julie Lambert has refused to provide written consent for family/significant other to be provided Family/Significant Other Suicide Prevention Education during admission and/or prior to discharge.  Physician notified.  SPE completed with pt, as pt refused to consent to family contact. SPI pamphlet provided to pt and pt was encouraged to share information with support network, ask questions, and talk about any concerns relating to SPE. Pt denies access to guns/firearms and verbalized understanding of information provided. Mobile Crisis information also provided to pt.    Rozann Lesches 03/20/2021, 10:46 AM

## 2021-03-21 ENCOUNTER — Other Ambulatory Visit: Payer: Self-pay

## 2021-03-25 ENCOUNTER — Other Ambulatory Visit: Payer: Self-pay

## 2021-06-24 ENCOUNTER — Emergency Department: Payer: 59

## 2021-06-24 ENCOUNTER — Emergency Department
Admission: EM | Admit: 2021-06-24 | Discharge: 2021-06-24 | Disposition: A | Discharge status: Home / Self Care | Payer: 59 | Attending: Emergency Medicine | Admitting: Emergency Medicine

## 2021-06-24 ENCOUNTER — Other Ambulatory Visit: Payer: Self-pay

## 2021-06-24 ENCOUNTER — Encounter: Payer: Self-pay | Admitting: Emergency Medicine

## 2021-06-24 DIAGNOSIS — E119 Type 2 diabetes mellitus without complications: Secondary | ICD-10-CM | POA: Diagnosis not present

## 2021-06-24 DIAGNOSIS — Z20822 Contact with and (suspected) exposure to covid-19: Secondary | ICD-10-CM | POA: Insufficient documentation

## 2021-06-24 DIAGNOSIS — J209 Acute bronchitis, unspecified: Secondary | ICD-10-CM | POA: Insufficient documentation

## 2021-06-24 DIAGNOSIS — R059 Cough, unspecified: Secondary | ICD-10-CM | POA: Diagnosis not present

## 2021-06-24 DIAGNOSIS — R112 Nausea with vomiting, unspecified: Secondary | ICD-10-CM | POA: Diagnosis not present

## 2021-06-24 DIAGNOSIS — R1084 Generalized abdominal pain: Secondary | ICD-10-CM | POA: Insufficient documentation

## 2021-06-24 DIAGNOSIS — J40 Bronchitis, not specified as acute or chronic: Secondary | ICD-10-CM

## 2021-06-24 DIAGNOSIS — R079 Chest pain, unspecified: Secondary | ICD-10-CM

## 2021-06-24 LAB — CBC WITH DIFFERENTIAL/PLATELET
Abs Immature Granulocytes: 0.03 10*3/uL (ref 0.00–0.07)
Basophils Absolute: 0.1 10*3/uL (ref 0.0–0.1)
Basophils Relative: 0 %
Eosinophils Absolute: 0.1 10*3/uL (ref 0.0–0.5)
Eosinophils Relative: 1 %
HCT: 38.2 % (ref 36.0–46.0)
Hemoglobin: 12.5 g/dL (ref 12.0–15.0)
Immature Granulocytes: 0 %
Lymphocytes Relative: 6 %
Lymphs Abs: 0.7 10*3/uL (ref 0.7–4.0)
MCH: 28.3 pg (ref 26.0–34.0)
MCHC: 32.7 g/dL (ref 30.0–36.0)
MCV: 86.6 fL (ref 80.0–100.0)
Monocytes Absolute: 0.4 10*3/uL (ref 0.1–1.0)
Monocytes Relative: 3 %
Neutro Abs: 10.8 10*3/uL — ABNORMAL HIGH (ref 1.7–7.7)
Neutrophils Relative %: 90 %
Platelets: 239 10*3/uL (ref 150–400)
RBC: 4.41 MIL/uL (ref 3.87–5.11)
RDW: 13.1 % (ref 11.5–15.5)
WBC: 12 10*3/uL — ABNORMAL HIGH (ref 4.0–10.5)
nRBC: 0 % (ref 0.0–0.2)

## 2021-06-24 LAB — RESP PANEL BY RT-PCR (FLU A&B, COVID) ARPGX2
Influenza A by PCR: NEGATIVE
Influenza B by PCR: NEGATIVE
SARS Coronavirus 2 by RT PCR: NEGATIVE

## 2021-06-24 LAB — URINALYSIS, ROUTINE W REFLEX MICROSCOPIC
Bilirubin Urine: NEGATIVE
Glucose, UA: 50 mg/dL — AB
Hgb urine dipstick: NEGATIVE
Ketones, ur: NEGATIVE mg/dL
Leukocytes,Ua: NEGATIVE
Nitrite: NEGATIVE
Protein, ur: NEGATIVE mg/dL
Specific Gravity, Urine: 1.016 (ref 1.005–1.030)
pH: 5 (ref 5.0–8.0)

## 2021-06-24 LAB — HEPATIC FUNCTION PANEL
ALT: 14 U/L (ref 0–44)
AST: 17 U/L (ref 15–41)
Albumin: 3.4 g/dL — ABNORMAL LOW (ref 3.5–5.0)
Alkaline Phosphatase: 92 U/L (ref 38–126)
Bilirubin, Direct: <0.1 mg/dL (ref 0.0–0.2)
Total Bilirubin: 0.4 mg/dL (ref 0.3–1.2)
Total Protein: 6.8 g/dL (ref 6.5–8.1)

## 2021-06-24 LAB — PREGNANCY, URINE: Preg Test, Ur: NEGATIVE

## 2021-06-24 LAB — BASIC METABOLIC PANEL
Anion gap: 8 (ref 5–15)
BUN: 14 mg/dL (ref 6–20)
CO2: 22 mmol/L (ref 22–32)
Calcium: 8.5 mg/dL — ABNORMAL LOW (ref 8.9–10.3)
Chloride: 109 mmol/L (ref 98–111)
Creatinine, Ser: 0.55 mg/dL (ref 0.44–1.00)
GFR, Estimated: >60 mL/min (ref >60)
Glucose, Bld: 174 mg/dL — ABNORMAL HIGH (ref 70–99)
Potassium: 3.9 mmol/L (ref 3.5–5.1)
Sodium: 139 mmol/L (ref 135–145)

## 2021-06-24 LAB — LIPASE, BLOOD: Lipase: 27 U/L (ref 11–51)

## 2021-06-24 MED ORDER — IOHEXOL 350 MG/ML SOLN
75.0000 mL | Freq: Once | INTRAVENOUS | Status: AC | PRN
  Administered 2021-06-24: 75 mL via INTRAVENOUS

## 2021-06-24 MED ORDER — MORPHINE SULFATE (PF) 4 MG/ML IV SOLN
4.0000 mg | Freq: Once | INTRAVENOUS | Status: AC
  Administered 2021-06-24: 4 mg via INTRAVENOUS
  Filled 2021-06-24: qty 1

## 2021-06-24 MED ORDER — KETOROLAC TROMETHAMINE 30 MG/ML IJ SOLN
15.0000 mg | Freq: Once | INTRAMUSCULAR | Status: DC
Start: 2021-06-24 — End: 2021-06-24
  Filled 2021-06-24: qty 1

## 2021-06-24 MED ORDER — ALBUTEROL SULFATE HFA 108 (90 BASE) MCG/ACT IN AERS
2.0000 | INHALATION_SPRAY | Freq: Four times a day (QID) | RESPIRATORY_TRACT | 0 refills | Status: DC | PRN
  Filled 2021-06-24: qty 8, fill #0

## 2021-06-24 MED ORDER — LACTATED RINGERS IV BOLUS
1000.0000 mL | Freq: Once | INTRAVENOUS | Status: AC
  Administered 2021-06-24: 1000 mL via INTRAVENOUS

## 2021-06-24 MED ORDER — GUAIFENESIN ER 600 MG PO TB12
600.0000 mg | ORAL_TABLET | Freq: Two times a day (BID) | ORAL | 0 refills | Status: AC
  Filled 2021-06-24: qty 14, 7d supply, fill #0

## 2021-06-24 MED ORDER — KETOROLAC TROMETHAMINE 30 MG/ML IJ SOLN
30.0000 mg | Freq: Once | INTRAMUSCULAR | Status: AC
  Administered 2021-06-24: 30 mg via INTRAMUSCULAR

## 2021-06-24 MED ORDER — ONDANSETRON HCL 4 MG/2ML IJ SOLN
4.0000 mg | Freq: Once | INTRAMUSCULAR | Status: AC
  Administered 2021-06-24: 4 mg via INTRAVENOUS
  Filled 2021-06-24: qty 2

## 2021-06-24 MED ORDER — ACETAMINOPHEN 500 MG PO TABS
1000.0000 mg | ORAL_TABLET | Freq: Once | ORAL | Status: AC
Start: 2021-06-24 — End: 2021-06-24
  Administered 2021-06-24: 1000 mg via ORAL
  Filled 2021-06-24: qty 2

## 2021-06-24 NOTE — ED Triage Notes (Signed)
Pt to ED via [facility] from home for pain in her back, chest, left arm, bilateral jaws. Pt states that the pain started this morning. Pt states that she is also having nausea. Pt states that the pain in her body is constant. Pt was given 324 mg of aspirin by EMS as well as 50 mcg of fentanyl and 4 mg of zofran.  ?

## 2021-06-24 NOTE — ED Provider Notes (Signed)
? ?[facility] ?Provider Note ? ? ? Event Date/Time  ? First MD Initiated Contact with Patient 06/24/21 1708   ?  (approximate) ? ? ?History  ? ?Chief Complaint ?Generalized Pain ? ? ?HPI ? ?Julie Lambert is a 56 y.o. female with past medical history of diabetes, bipolar disorder, GERD, and substance abuse who presents to the ED complaining of chest pain.  Patient reports that she has been dealing with intermittent pain in her chest for about the past month.  She describes the pain as sharp and gradually worsening, present throughout most of the day but not exacerbated or alleviated by anything in particular.  She states she has been dealing with increasing difficulty breathing during this time and has also had a dry cough.  She denies any fevers at home but was noted to have a temperature of 100.4 in triage.  She states that the pain spreads across the left side of her chest into her back, also extends down into her abdomen diffusely as well as her arms and legs.  She has felt nauseous with multiple episodes of vomiting, denies diarrhea, dysuria, or hematuria.  She does state that she spent time with her grandsons 2 days ago, who were sick with a fever. ?  ? ? ?Physical Exam  ? ?Triage Vital Signs: ?ED Triage Vitals  ?Enc Vitals Group  ?   BP 06/24/21 1536 107/71  ?   Pulse Rate 06/24/21 1536 95  ?   Resp 06/24/21 1536 16  ?   Temp 06/24/21 1536 (!) 100.4 ?F (38 ?C)  ?   Temp Source 06/24/21 1536 Oral  ?   SpO2 06/24/21 1536 95 %  ?   Weight 06/24/21 1534 180 lb (81.6 kg)  ?   Height 06/24/21 1534 '5\' 7"'$  (1.702 m)  ?   Head Circumference --   ?   Peak Flow --   ?   Pain Score 06/24/21 1534 10  ?   Pain Loc --   ?   Pain Edu? --   ?   Excl. in [place]? --   ? ? ?Most recent vital signs: ?Vitals:  ? 06/24/21 1536  ?BP: 107/71  ?Pulse: 95  ?Resp: 16  ?Temp: (!) 100.4 ?F (38 ?C)  ?SpO2: 95%  ? ? ?Constitutional: Alert and oriented.  Uncomfortable appearing and tearful. ?Eyes: Conjunctivae are  normal. ?Head: Atraumatic. ?Nose: No congestion/rhinnorhea. ?Mouth/Throat: Mucous membranes are moist.  ?Neck: No stiffness or meningismus noted. ?Cardiovascular: Normal rate, regular rhythm. Grossly normal heart sounds.  2+ radial pulses bilaterally. ?Respiratory: Normal respiratory effort.  No retractions. Lungs CTAB.  Left chest wall tender to palpation. ?Gastrointestinal: Soft and diffusely tender to palpation with no rebound or guarding.  No CVA tenderness bilaterally. No distention. ?Musculoskeletal: No lower extremity tenderness nor edema.  ?Neurologic:  Normal speech and language. No gross focal neurologic deficits are appreciated. ? ? ? ?ED Results / Procedures / Treatments  ? ?Labs ?(all labs ordered are listed, but only abnormal results are displayed) ?Labs Reviewed  ?CBC WITH DIFFERENTIAL/PLATELET - Abnormal; Notable for the following components:  ?    Result Value  ? WBC 12.0 (*)   ? Neutro Abs 10.8 (*)   ? All other components within normal limits  ?BASIC METABOLIC PANEL - Abnormal; Notable for the following components:  ? Glucose, Bld 174 (*)   ? Calcium 8.5 (*)   ? All other components within normal limits  ?URINALYSIS, ROUTINE W REFLEX MICROSCOPIC -  Abnormal; Notable for the following components:  ? Color, Urine YELLOW (*)   ? APPearance CLEAR (*)   ? Glucose, UA 50 (*)   ? All other components within normal limits  ?HEPATIC FUNCTION PANEL - Abnormal; Notable for the following components:  ? Albumin 3.4 (*)   ? All other components within normal limits  ?RESP PANEL BY RT-PCR (FLU A&B, COVID) ARPGX2  ?LIPASE, BLOOD  ?PREGNANCY, URINE  ? ? ? ?EKG ? ?ED ECG REPORT ?Tempie Hoist, the attending physician, personally viewed and interpreted this ECG. ? ? Date: 06/24/2021 ? EKG Time: 15:48 ? Rate: 91 ? Rhythm: normal sinus rhythm ? Axis: LAD ? Intervals:left anterior fascicular block ? ST&T Change: LVH ? ?RADIOLOGY ?Chest x-ray reviewed and interpreted by me with possible left lower lobe infiltrate, no  effusion or pulmonary edema noted. ? ?PROCEDURES: ? ?Critical Care performed: No ? ?Procedures ? ? ?MEDICATIONS ORDERED IN ED: ?Medications  ?ketorolac (TORADOL) 30 MG/ML injection 15 mg (has no administration in time range)  ?morphine (PF) 4 MG/ML injection 4 mg (4 mg Intravenous Given 06/24/21 1854)  ?ondansetron [facility]) injection 4 mg (4 mg Intravenous Given 06/24/21 1853)  ?lactated ringers bolus 1,000 mL (1,000 mLs Intravenous New Bag/Given 06/24/21 1853)  ?acetaminophen (TYLENOL) tablet 1,000 mg (1,000 mg Oral Given 06/24/21 1853)  ?iohexol (OMNIPAQUE) 350 MG/ML injection 75 mL (75 mLs Intravenous Contrast Given 06/24/21 1918)  ? ? ? ?IMPRESSION / MDM / ASSESSMENT AND PLAN / ED COURSE  ?I reviewed the triage vital signs and the nursing notes. ?             ?               ? ?56 y.o. female with past medical history of diabetes, bipolar disorder, GERD, and polysubstance abuse who presents to the ED with 1 month of increasing pain starting in the center of her chest and moving across the left side as well as downwards into her abdomen and back.  Pain associated with cough and increasing difficulty breathing. ? ?Differential diagnosis includes, but is not limited to, ACS, PE, pneumonia, pneumothorax, bronchitis, COVID-19, influenza, gastritis, hepatitis, pancreatitis, UTI. ? ?Patient uncomfortable appearing and tearful, but nontoxic.  Vitals remarkable for fever at 100.4 but vital signs otherwise reassuring with no other findings concerning for sepsis.  She is not in any respiratory distress and is maintaining O2 sats on room air at 95%, lungs clear to auscultation bilaterally.  EKG shows no evidence of arrhythmia or ischemia and troponin is negative, low suspicion for ACS given atypical symptoms for 1 month.  Chest x-ray does suggest possible pneumonia but I would also be concerned for PE given her severe pain and worsening difficulty breathing.  In addition to CTA of her chest, we will check CT of her  abdomen/pelvis given her diffuse tenderness.  Urinalysis is pending, we will treat symptomatically with IV morphine and Zofran, hydrate with IV fluids.  Viral testing for COVID-19 and influenza has come back negative. ? ?Patient states she has an allergy to IV contrast, but reports her only reaction is nausea.  Chart lists itching in the past, but patient denies this or any other allergic symptoms beyond nausea.  Benefits of proceeding with CTA of her chest to rule out PE outweigh risks of potential allergic reaction and patient would benefit from timely diagnosis without delay for pretreatment. ? ?CTA of chest for PE does not show any focal consolidation concerning for pneumonia.  CT of abdomen/pelvis  is also unremarkable with no evidence of infectious process.  Pregnancy testing is negative and UA does not appear consistent with infection.  Given reassuring work-up, suspect patient's symptoms are due to a viral bronchitis and she is appropriate for outpatient management.  She will be prescribed albuterol and Mucinex for symptomatic treatment, was counseled to follow-up with her PCP and to return to the ED for new or worsening symptoms.  Patient agrees with plan. ? ?  ? ? ?FINAL CLINICAL IMPRESSION(S) / ED DIAGNOSES  ? ?Final diagnoses:  ?Bronchitis  ?Chest pain, unspecified type  ? ? ? ?Rx / DC Orders  ? ?ED Discharge Orders   ? ?      Ordered  ?  albuterol (VENTOLIN HFA) 108 (90 Base) MCG/ACT inhaler  Every 6 hours PRN       ?Note to Pharmacy: Please supply with spacer  ? 06/24/21 1957  ?  guaiFENesin (MUCINEX) 600 MG 12 hr tablet  2 times daily       ? 06/24/21 1957  ? ?  ?  ? ?  ? ? ? ?Note:  This document was prepared using Dragon voice recognition software and may include unintentional dictation errors. ?  ?Blake Divine, MD ?06/24/21 1959 ? ?

## 2021-06-25 ENCOUNTER — Other Ambulatory Visit: Payer: Self-pay

## 2021-07-11 ENCOUNTER — Ambulatory Visit
Admission: EM | Admit: 2021-07-11 | Discharge: 2021-07-11 | Disposition: A | Discharge status: Home / Self Care | Payer: Self-pay | Attending: Physician Assistant | Admitting: Physician Assistant

## 2021-07-11 ENCOUNTER — Ambulatory Visit (INDEPENDENT_AMBULATORY_CARE_PROVIDER_SITE_OTHER): Payer: Self-pay

## 2021-07-11 DIAGNOSIS — R059 Cough, unspecified: Secondary | ICD-10-CM

## 2021-07-11 DIAGNOSIS — R051 Acute cough: Secondary | ICD-10-CM

## 2021-07-11 DIAGNOSIS — J019 Acute sinusitis, unspecified: Secondary | ICD-10-CM

## 2021-07-11 DIAGNOSIS — J209 Acute bronchitis, unspecified: Secondary | ICD-10-CM

## 2021-07-11 MED ORDER — AMOXICILLIN 500 MG PO CAPS: 500.0000 mg | ORAL_CAPSULE | Freq: Three times a day (TID) | ORAL | 0 refills | Status: AC

## 2021-07-11 MED ORDER — PROMETHAZINE-DM 6.25-15 MG/5ML PO SYRP: 5.0000 mL | ORAL_SOLUTION | Freq: Four times a day (QID) | ORAL | 0 refills | Status: DC | PRN
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
## 2021-07-11 NOTE — ED Notes (Signed)
Patient getting dressed.

## 2021-07-11 NOTE — Discharge Instructions (Signed)
-  Your x-ray does not show any evidence of pneumonia.  You likely have viral bronchitis and a sinus infection.  I have sent antibiotics to pharmacy as well as a cough medicine.  Hopefully you are able to pick at these medications, get some rest and feel better. - You can also add Mucinex D, Flonase, nasal saline, Chloraseptic spray and cough drops. -You should return to the ER if you develop fever or worsening symptoms.

## 2021-07-11 NOTE — ED Triage Notes (Signed)
Pt had a stomach virus 2-3weeks ago and was diagnosed with Viral Bronchitis  Pt states that the coughing has become worse.  Pt c/o of sinus and ear pain, nasal congestion x1weeks.   Pt states that when shes at rest she can feel her pulse.

## 2021-10-04 ENCOUNTER — Emergency Department
Admission: EM | Admit: 2021-10-04 | Discharge: 2021-10-05 | Disposition: A | Discharge status: Other Acute Inpatient Hospital | Payer: Self-pay | Attending: Emergency Medicine | Admitting: Emergency Medicine

## 2021-10-04 ENCOUNTER — Other Ambulatory Visit: Payer: Self-pay

## 2021-10-04 DIAGNOSIS — E119 Type 2 diabetes mellitus without complications: Secondary | ICD-10-CM | POA: Insufficient documentation

## 2021-10-04 DIAGNOSIS — R69 Illness, unspecified: Secondary | ICD-10-CM | POA: Diagnosis not present

## 2021-10-04 DIAGNOSIS — F1010 Alcohol abuse, uncomplicated: Secondary | ICD-10-CM

## 2021-10-04 DIAGNOSIS — Z794 Long term (current) use of insulin: Secondary | ICD-10-CM | POA: Insufficient documentation

## 2021-10-04 DIAGNOSIS — Z20822 Contact with and (suspected) exposure to covid-19: Secondary | ICD-10-CM | POA: Insufficient documentation

## 2021-10-04 DIAGNOSIS — F17210 Nicotine dependence, cigarettes, uncomplicated: Secondary | ICD-10-CM | POA: Insufficient documentation

## 2021-10-04 DIAGNOSIS — R251 Tremor, unspecified: Secondary | ICD-10-CM | POA: Insufficient documentation

## 2021-10-04 DIAGNOSIS — F14288 Cocaine dependence with other cocaine-induced disorder: Secondary | ICD-10-CM | POA: Insufficient documentation

## 2021-10-04 DIAGNOSIS — R45851 Suicidal ideations: Secondary | ICD-10-CM

## 2021-10-04 DIAGNOSIS — F332 Major depressive disorder, recurrent severe without psychotic features: Secondary | ICD-10-CM | POA: Insufficient documentation

## 2021-10-04 DIAGNOSIS — R739 Hyperglycemia, unspecified: Secondary | ICD-10-CM | POA: Diagnosis not present

## 2021-10-04 DIAGNOSIS — F1420 Cocaine dependence, uncomplicated: Secondary | ICD-10-CM | POA: Diagnosis present

## 2021-10-04 DIAGNOSIS — F10988 Alcohol use, unspecified with other alcohol-induced disorder: Secondary | ICD-10-CM | POA: Insufficient documentation

## 2021-10-04 DIAGNOSIS — F32A Depression, unspecified: Secondary | ICD-10-CM

## 2021-10-04 DIAGNOSIS — Y902 Blood alcohol level of 40-59 mg/100 ml: Secondary | ICD-10-CM | POA: Insufficient documentation

## 2021-10-04 DIAGNOSIS — F1020 Alcohol dependence, uncomplicated: Secondary | ICD-10-CM | POA: Diagnosis present

## 2021-10-04 LAB — COMPREHENSIVE METABOLIC PANEL
ALT: 14 U/L (ref 0–44)
AST: 15 U/L (ref 15–41)
Albumin: 4.1 g/dL (ref 3.5–5.0)
Alkaline Phosphatase: 122 U/L (ref 38–126)
Anion gap: 14 (ref 5–15)
BUN: 11 mg/dL (ref 6–20)
CO2: 17 mmol/L — ABNORMAL LOW (ref 22–32)
Calcium: 9.1 mg/dL (ref 8.9–10.3)
Chloride: 106 mmol/L (ref 98–111)
Creatinine, Ser: 0.56 mg/dL (ref 0.44–1.00)
GFR, Estimated: >60 mL/min (ref >60)
Glucose, Bld: 258 mg/dL — ABNORMAL HIGH (ref 70–99)
Potassium: 3.5 mmol/L (ref 3.5–5.1)
Sodium: 137 mmol/L (ref 135–145)
Total Bilirubin: 0.8 mg/dL (ref 0.3–1.2)
Total Protein: 8 g/dL (ref 6.5–8.1)

## 2021-10-04 LAB — SALICYLATE LEVEL: Salicylate Lvl: <7 mg/dL — ABNORMAL LOW (ref 7.0–30.0)

## 2021-10-04 LAB — CBC
HCT: 40.7 % (ref 36.0–46.0)
Hemoglobin: 13.4 g/dL (ref 12.0–15.0)
MCH: 27.7 pg (ref 26.0–34.0)
MCHC: 32.9 g/dL (ref 30.0–36.0)
MCV: 84.3 fL (ref 80.0–100.0)
Platelets: 285 10*3/uL (ref 150–400)
RBC: 4.83 MIL/uL (ref 3.87–5.11)
RDW: 13.4 % (ref 11.5–15.5)
WBC: 9.3 10*3/uL (ref 4.0–10.5)
nRBC: 0 % (ref 0.0–0.2)

## 2021-10-04 LAB — ACETAMINOPHEN LEVEL: Acetaminophen (Tylenol), Serum: <10 ug/mL — ABNORMAL LOW (ref 10–30)

## 2021-10-04 LAB — CBG MONITORING, ED
Glucose-Capillary: 197 mg/dL — ABNORMAL HIGH (ref 70–99)
Glucose-Capillary: 199 mg/dL — ABNORMAL HIGH (ref 70–99)

## 2021-10-04 LAB — ETHANOL: Alcohol, Ethyl (B): 56 mg/dL — ABNORMAL HIGH (ref <10)

## 2021-10-04 MED ORDER — INSULIN ASPART 100 UNIT/ML IJ SOLN
0.0000 [IU] | Freq: Three times a day (TID) | INTRAMUSCULAR | Status: DC
  Administered 2021-10-04: 3 [IU] via SUBCUTANEOUS
  Administered 2021-10-05: 8 [IU] via SUBCUTANEOUS
  Administered 2021-10-05: 5 [IU] via SUBCUTANEOUS
  Administered 2021-10-05: 3 [IU] via SUBCUTANEOUS
  Filled 2021-10-04 (×4): qty 1

## 2021-10-04 MED ORDER — GABAPENTIN 300 MG PO CAPS
300.0000 mg | ORAL_CAPSULE | Freq: Three times a day (TID) | ORAL | Status: DC
  Administered 2021-10-04 – 2021-10-05 (×4): 300 mg via ORAL
  Filled 2021-10-04 (×4): qty 1

## 2021-10-04 MED ORDER — INSULIN GLARGINE-YFGN 100 UNIT/ML ~~LOC~~ SOLN
30.0000 [IU] | Freq: Every day | SUBCUTANEOUS | Status: DC
  Administered 2021-10-04: 30 [IU] via SUBCUTANEOUS
  Filled 2021-10-04 (×2): qty 0.3

## 2021-10-04 MED ORDER — INSULIN LISPRO (1 UNIT DIAL) 100 UNIT/ML (KWIKPEN): 3.0000 [IU] | PEN_INJECTOR | Freq: Three times a day (TID) | SUBCUTANEOUS | Status: DC

## 2021-10-04 MED ORDER — TRAZODONE HCL 100 MG PO TABS
100.0000 mg | ORAL_TABLET | Freq: Every day | ORAL | Status: DC
  Administered 2021-10-04: 100 mg via ORAL
  Filled 2021-10-04: qty 1

## 2021-10-04 MED ORDER — HYDROXYZINE HCL 25 MG PO TABS
25.0000 mg | ORAL_TABLET | Freq: Three times a day (TID) | ORAL | Status: DC | PRN
  Administered 2021-10-04: 25 mg via ORAL
  Filled 2021-10-04: qty 1

## 2021-10-04 MED ORDER — INSULIN GLARGINE-YFGN 100 UNIT/ML ~~LOC~~ SOLN
10.0000 [IU] | Freq: Every morning | SUBCUTANEOUS | Status: DC
  Administered 2021-10-05: 10 [IU] via SUBCUTANEOUS
  Filled 2021-10-04: qty 0.1

## 2021-10-04 MED ORDER — LAMOTRIGINE 100 MG PO TABS
100.0000 mg | ORAL_TABLET | Freq: Every day | ORAL | Status: DC
  Administered 2021-10-04 – 2021-10-05 (×2): 100 mg via ORAL
  Filled 2021-10-04 (×2): qty 1

## 2021-10-04 MED ORDER — NICOTINE 21 MG/24HR TD PT24
21.0000 mg | MEDICATED_PATCH | Freq: Every day | TRANSDERMAL | Status: DC
  Administered 2021-10-04 – 2021-10-05 (×2): 21 mg via TRANSDERMAL
  Filled 2021-10-04 (×2): qty 1

## 2021-10-04 NOTE — ED Notes (Signed)
Snack and beverage given. 

## 2021-10-04 NOTE — ED Triage Notes (Signed)
Pt to ED from home, states called sheriff dept and brought to ED by EMS for SI. Pt is weeping, stating "I can't do it". Pt unable to provide timeline for how long SI occurring, endorses hx SI and suicide attempts. States depression is worse and can hardly function since her mother died in 08/30/22. States feels alone, "no one cares" and "what's the purpose of me being here?" And "what am I gonna do?" And that does not want to burden her son.   Pt endorses regular alcohol use, last drink was this morning, states consumed 6 beers since last night.

## 2021-10-04 NOTE — ED Notes (Signed)
Pt given dinner tray and drink at this time. 

## 2021-10-04 NOTE — ED Notes (Signed)
CBG 199 

## 2021-10-04 NOTE — Consult Note (Signed)
Client refused to answer questions, requested we return later.  She was crying but she cries frequently from her past visits, also been drinking alcohol.  Waylan Boga, PMHNP

## 2021-10-04 NOTE — ED Notes (Signed)
IVC/pending psych consult 

## 2021-10-04 NOTE — ED Provider Notes (Signed)
   [facility] Provider Note    Event Date/Time   First MD Initiated Contact with Patient 10/04/21 1431     (approximate)  History   Chief Complaint: Suicidal  HPI  Mayari L. Tieken is a 56 y.o. female with a past medical history of bipolar disease, diabetes, gastric reflux, substance abuse presents to the emergency department for suicidal thoughts.  According to the patient she has been feeling more depressed recently has been using alcohol as well as cocaine, last use of both was this morning.  Patient states she called the sheriff because she was feeling very depressed and suicidal.  Patient is tearful in the emergency department.  Denies any medical complaints besides nausea.  Physical Exam   Triage Vital Signs: ED Triage Vitals  Enc Vitals Group     BP 10/04/21 1357 (!) 154/104     Pulse Rate 10/04/21 1357 (!) 109     Resp 10/04/21 1357 20     Temp 10/04/21 1357 98.2 F (36.8 C)     Temp Source 10/04/21 1357 Oral     SpO2 10/04/21 1357 98 %     Weight 10/04/21 1354 180 lb (81.6 kg)     Height 10/04/21 1354 '5\' 6"'$  (1.676 m)     Head Circumference --      Peak Flow --      Pain Score 10/04/21 1353 0     Pain Loc --      Pain Edu? --      Excl. in [place]? --     Most recent vital signs: Vitals:   10/04/21 1357  BP: (!) 154/104  Pulse: (!) 109  Resp: 20  Temp: 98.2 F (36.8 C)  SpO2: 98%    General: Awake, no distress.  CV:  Good peripheral perfusion.  Regular rate and rhythm  Resp:  Normal effort.  Equal breath sounds bilaterally.  Abd:  No distention.  Soft, nontender.  No rebound or guarding.   ED Results / Procedures / Treatments   MEDICATIONS ORDERED IN ED: Medications - No data to display   IMPRESSION / MDM / [place] / ED COURSE  I reviewed the triage vital signs and the nursing notes.  Patient's presentation is most consistent with acute presentation with potential threat to life or bodily function.  Patient  presents emergency department for suicidal thoughts.  Patient states she has been feeling worsening depression has been using alcohol and cocaine including this morning.  Patient is tearful in the emergency department and is requesting help.  We will place the patient under IVC given her suicidal thoughts and substance abuse.  We will have psychiatry come evaluate the patient.  We will check labs and continue to closely monitor.  We will treat patient's nausea with ODT Zofran while awaiting further results.  Patient agreeable to plan.  Patient's CBC is normal, chemistry shows mild hyperglycemia otherwise normal.  Remainder the lab work is pending.  I will reorder the patient's home medications.  FINAL CLINICAL IMPRESSION(S) / ED DIAGNOSES   Depression Suicidal thoughts    Note:  This document was prepared using Dragon voice recognition software and may include unintentional dictation errors.   Harvest Dark, MD 10/04/21 1451

## 2021-10-04 NOTE — ED Notes (Addendum)
Pt dressed out with this EDT Faith and EMT Emma  Pt belongings include  Pink shirt  White leggings  Lighter  [employer] and White purse  Pink under wear  2 gray earrings  Sliver bracelet Sliver ring   Black bracelet  2 bags of items

## 2021-10-04 NOTE — ED Notes (Signed)
Pt to ED via [facility] for SI. Per EMS Pt denies plan at this time. Pt admits to cocaine use about 3 hours PTA and ETOH use.
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
## 2021-10-04 NOTE — ED Notes (Signed)
Report to include Situation, Background, Assessment, and Recommendations received from [place] RN. Patient alert and oriented, warm and dry, in no acute distress. Patient denies SI, HI, AVH and pain. Patient made aware of Q15 minute rounds and security cameras for their safety. Patient instructed to come to me with needs or concerns.

## 2021-10-04 NOTE — BH Assessment (Signed)
Patient unable to participate in the interview at this time. She had her back towards the writer, and could be heard making crying noses and shared she was too upset to talk.

## 2021-10-04 NOTE — ED Notes (Signed)
IVC 

## 2021-10-05 ENCOUNTER — Inpatient Hospital Stay
Admission: AD | Admit: 2021-10-05 | Discharge: 2021-10-14 | DRG: 885 | Disposition: A | Discharge status: Home / Self Care | Payer: 59 | Source: Intra-hospital | Attending: Psychiatry | Admitting: Psychiatry

## 2021-10-05 DIAGNOSIS — F17210 Nicotine dependence, cigarettes, uncomplicated: Secondary | ICD-10-CM | POA: Diagnosis present

## 2021-10-05 DIAGNOSIS — Z79899 Other long term (current) drug therapy: Secondary | ICD-10-CM | POA: Diagnosis not present

## 2021-10-05 DIAGNOSIS — F4010 Social phobia, unspecified: Secondary | ICD-10-CM | POA: Diagnosis present

## 2021-10-05 DIAGNOSIS — F1020 Alcohol dependence, uncomplicated: Secondary | ICD-10-CM | POA: Diagnosis present

## 2021-10-05 DIAGNOSIS — G43909 Migraine, unspecified, not intractable, without status migrainosus: Secondary | ICD-10-CM | POA: Diagnosis present

## 2021-10-05 DIAGNOSIS — K219 Gastro-esophageal reflux disease without esophagitis: Secondary | ICD-10-CM | POA: Diagnosis present

## 2021-10-05 DIAGNOSIS — K0530 Chronic periodontitis, unspecified: Secondary | ICD-10-CM | POA: Diagnosis present

## 2021-10-05 DIAGNOSIS — F332 Major depressive disorder, recurrent severe without psychotic features: Principal | ICD-10-CM | POA: Diagnosis present

## 2021-10-05 DIAGNOSIS — Z794 Long term (current) use of insulin: Secondary | ICD-10-CM | POA: Diagnosis not present

## 2021-10-05 DIAGNOSIS — R45851 Suicidal ideations: Secondary | ICD-10-CM | POA: Diagnosis present

## 2021-10-05 DIAGNOSIS — Z8249 Family history of ischemic heart disease and other diseases of the circulatory system: Secondary | ICD-10-CM

## 2021-10-05 DIAGNOSIS — I10 Essential (primary) hypertension: Secondary | ICD-10-CM | POA: Diagnosis present

## 2021-10-05 DIAGNOSIS — R41843 Psychomotor deficit: Secondary | ICD-10-CM | POA: Diagnosis present

## 2021-10-05 DIAGNOSIS — F1420 Cocaine dependence, uncomplicated: Secondary | ICD-10-CM | POA: Diagnosis present

## 2021-10-05 DIAGNOSIS — E119 Type 2 diabetes mellitus without complications: Secondary | ICD-10-CM | POA: Diagnosis present

## 2021-10-05 DIAGNOSIS — Z833 Family history of diabetes mellitus: Secondary | ICD-10-CM

## 2021-10-05 DIAGNOSIS — Z9049 Acquired absence of other specified parts of digestive tract: Secondary | ICD-10-CM

## 2021-10-05 LAB — GLUCOSE, CAPILLARY: Glucose-Capillary: 270 mg/dL — ABNORMAL HIGH (ref 70–99)

## 2021-10-05 LAB — RESP PANEL BY RT-PCR (FLU A&B, COVID) ARPGX2
Influenza A by PCR: NEGATIVE
Influenza B by PCR: NEGATIVE
SARS Coronavirus 2 by RT PCR: NEGATIVE

## 2021-10-05 LAB — URINE DRUG SCREEN, QUALITATIVE (ARMC ONLY)
Amphetamines, Ur Screen: NOT DETECTED
Barbiturates, Ur Screen: NOT DETECTED
Benzodiazepine, Ur Scrn: NOT DETECTED
Cannabinoid 50 Ng, Ur ~~LOC~~: NOT DETECTED
Cocaine Metabolite,Ur ~~LOC~~: POSITIVE — AB
MDMA (Ecstasy)Ur Screen: NOT DETECTED
Methadone Scn, Ur: NOT DETECTED
Opiate, Ur Screen: NOT DETECTED
Phencyclidine (PCP) Ur S: NOT DETECTED
Tricyclic, Ur Screen: NOT DETECTED

## 2021-10-05 LAB — CBG MONITORING, ED
Glucose-Capillary: 245 mg/dL — ABNORMAL HIGH (ref 70–99)
Glucose-Capillary: 282 mg/dL — ABNORMAL HIGH (ref 70–99)
Glucose-Capillary: 178 mg/dL — ABNORMAL HIGH (ref 70–99)

## 2021-10-05 MED ORDER — NICOTINE 21 MG/24HR TD PT24
21.0000 mg | MEDICATED_PATCH | Freq: Every day | TRANSDERMAL | Status: DC
  Administered 2021-10-06 – 2021-10-14 (×4): 21 mg via TRANSDERMAL
  Filled 2021-10-05 (×5): qty 1

## 2021-10-05 MED ORDER — INSULIN GLARGINE-YFGN 100 UNIT/ML ~~LOC~~ SOLN
30.0000 [IU] | Freq: Every day | SUBCUTANEOUS | Status: DC
  Administered 2021-10-05 – 2021-10-13 (×8): 30 [IU] via SUBCUTANEOUS
  Filled 2021-10-05 (×9): qty 0.3

## 2021-10-05 MED ORDER — GABAPENTIN 300 MG PO CAPS
300.0000 mg | ORAL_CAPSULE | Freq: Three times a day (TID) | ORAL | Status: DC
  Administered 2021-10-06: 300 mg via ORAL
  Filled 2021-10-05: qty 1

## 2021-10-05 MED ORDER — TRAZODONE HCL 100 MG PO TABS
100.0000 mg | ORAL_TABLET | Freq: Every day | ORAL | Status: DC
  Administered 2021-10-05 – 2021-10-06 (×2): 100 mg via ORAL
  Filled 2021-10-05 (×2): qty 1

## 2021-10-05 MED ORDER — MAGNESIUM HYDROXIDE 400 MG/5ML PO SUSP
30.0000 mL | Freq: Every day | ORAL | Status: DC | PRN
  Administered 2021-10-07 – 2021-10-10 (×2): 30 mL via ORAL
  Filled 2021-10-05 (×2): qty 30

## 2021-10-05 MED ORDER — ACETAMINOPHEN 325 MG PO TABS
650.0000 mg | ORAL_TABLET | Freq: Four times a day (QID) | ORAL | Status: DC | PRN
  Administered 2021-10-05 – 2021-10-14 (×12): 650 mg via ORAL
  Filled 2021-10-05 (×12): qty 2

## 2021-10-05 MED ORDER — ALUM & MAG HYDROXIDE-SIMETH 200-200-20 MG/5ML PO SUSP: 30.0000 mL | ORAL | Status: DC | PRN

## 2021-10-05 MED ORDER — LORAZEPAM 2 MG/ML IJ SOLN: 0.0000 mg | Freq: Two times a day (BID) | INTRAMUSCULAR | Status: DC

## 2021-10-05 MED ORDER — LORAZEPAM 2 MG/ML IJ SOLN: 0.0000 mg | Freq: Four times a day (QID) | INTRAMUSCULAR | Status: DC

## 2021-10-05 MED ORDER — LORAZEPAM 2 MG PO TABS
0.0000 mg | ORAL_TABLET | Freq: Four times a day (QID) | ORAL | Status: DC
  Administered 2021-10-05: 2 mg via ORAL
  Filled 2021-10-05: qty 1

## 2021-10-05 MED ORDER — ACETAMINOPHEN 325 MG PO TABS
650.0000 mg | ORAL_TABLET | Freq: Four times a day (QID) | ORAL | Status: DC | PRN
  Administered 2021-10-05: 650 mg via ORAL
  Filled 2021-10-05: qty 2

## 2021-10-05 MED ORDER — INSULIN GLARGINE-YFGN 100 UNIT/ML ~~LOC~~ SOLN
10.0000 [IU] | Freq: Every morning | SUBCUTANEOUS | Status: DC
  Administered 2021-10-06 – 2021-10-14 (×9): 10 [IU] via SUBCUTANEOUS
  Filled 2021-10-05 (×9): qty 0.1

## 2021-10-05 MED ORDER — HYDROXYZINE HCL 25 MG PO TABS
25.0000 mg | ORAL_TABLET | Freq: Three times a day (TID) | ORAL | Status: DC | PRN
  Administered 2021-10-05: 25 mg via ORAL
  Filled 2021-10-05: qty 1

## 2021-10-05 MED ORDER — LAMOTRIGINE 100 MG PO TABS
100.0000 mg | ORAL_TABLET | Freq: Every day | ORAL | Status: DC
  Administered 2021-10-06 – 2021-10-09 (×4): 100 mg via ORAL
  Filled 2021-10-05 (×4): qty 1

## 2021-10-05 MED ORDER — INSULIN ASPART 100 UNIT/ML IJ SOLN
0.0000 [IU] | Freq: Three times a day (TID) | INTRAMUSCULAR | Status: DC
  Administered 2021-10-06 (×2): 3 [IU] via SUBCUTANEOUS
  Administered 2021-10-06: 5 [IU] via SUBCUTANEOUS
  Administered 2021-10-07: 8 [IU] via SUBCUTANEOUS
  Administered 2021-10-07: 3 [IU] via SUBCUTANEOUS
  Administered 2021-10-07 – 2021-10-08 (×2): 5 [IU] via SUBCUTANEOUS
  Administered 2021-10-08 (×2): 3 [IU] via SUBCUTANEOUS
  Administered 2021-10-09: 5 [IU] via SUBCUTANEOUS
  Administered 2021-10-09 – 2021-10-10 (×3): 2 [IU] via SUBCUTANEOUS
  Administered 2021-10-10: 3 [IU] via SUBCUTANEOUS
  Administered 2021-10-10: 8 [IU] via SUBCUTANEOUS
  Administered 2021-10-11: 5 [IU] via SUBCUTANEOUS
  Administered 2021-10-11 (×2): 3 [IU] via SUBCUTANEOUS
  Administered 2021-10-12 (×3): 5 [IU] via SUBCUTANEOUS
  Administered 2021-10-13: 8 [IU] via SUBCUTANEOUS
  Administered 2021-10-13 (×2): 5 [IU] via SUBCUTANEOUS
  Administered 2021-10-14: 8 [IU] via SUBCUTANEOUS
  Filled 2021-10-05 (×25): qty 1

## 2021-10-05 MED ORDER — LORAZEPAM 2 MG PO TABS: 0.0000 mg | ORAL_TABLET | Freq: Two times a day (BID) | ORAL | Status: DC

## 2021-10-05 NOTE — ED Notes (Signed)
Report given to inpatient BMU.

## 2021-10-05 NOTE — Consult Note (Signed)
[facility] Consult   Reason for Consult:  suicidal ideations Referring Physician:  EDP Patient Identification: Julie Lambert MRN:  [id number] Principal Diagnosis: Severe recurrent major depression without psychotic features ([place]) Diagnosis:  Principal Problem:   Severe recurrent major depression without psychotic features (Julie Lambert) Active Problems:   Severe alcohol use disorder (HCC)   Cocaine use disorder, moderate, dependence ([name])   Total Time spent with patient: 45 minutes  Subjective:   Julie Lambert is a 56 y.o. female patient admitted with alcohol abuse and cocaine abuse with suicidal ideations related to the loss of her mother.  "I don't care anymore what happens."  HPI:  56 yo female with a long history of alcohol and cocaine use and depression.  Recently she lost her mother and does not have a will to live.  She is self-medicating with alcohol and cocaine with nothing helping her high level of depression.  Anxiety is also high, no panic attacks. No psychosis,  Slight tremors from alcohol withdrawal.  Discussed admission and she is agreeable.  Past Psychiatric History: depression, cocaine and alcohol use disorders  Risk to Self:  yes Risk to Others:  none Prior Inpatient Therapy:  yes Prior Outpatient Therapy:  [facility] "didn't help me"  Past Medical History:  Past Medical History:  Diagnosis Date   Bipolar 2 disorder ([name])    Diabetes mellitus without complication ([place])    Diverticulitis    Diverticulosis    GERD (gastroesophageal reflux disease)    Sciatica    Substance abuse ([name])    Uterine fibroid     Past Surgical History:  Procedure Laterality Date   BACK SURGERY     CHOLECYSTECTOMY     LUMBAR SPINE SURGERY     Family History:  Family History  Problem Relation Age of Onset   CAD Mother    Diabetes Mother    Heart disease Mother    Parkinson's disease Mother    Kidney disease Mother    Cancer Father        prostate cancer    Breast cancer Maternal Grandmother 45   Breast cancer Cousin 50   Breast cancer Cousin 50   Breast cancer Cousin 5   Breast cancer Cousin 1   Family Psychiatric  History: see above Social History:  Social History   Substance and Sexual Activity  Alcohol Use Yes   Comment: once per week, 6 beers     Social History   Substance and Sexual Activity  Drug Use Not Currently   Types: "Crack" cocaine   Comment: last use 7/22, smoked crack    Social History   Socioeconomic History   Marital status: Divorced    Spouse name: Not on file   Number of children: Not on file   Years of education: Not on file   Highest education level: Not on file  Occupational History   Not on file  Tobacco Use   Smoking status: Every Day    Packs/day: 0.25    Years: 40.00    Total pack years: 10.00    Types: Cigarettes   Smokeless tobacco: Never  Vaping Use   Vaping Use: Never used  Substance and Sexual Activity   Alcohol use: Yes    Comment: once per week, 6 beers   Drug use: Not Currently    Types: "Crack" cocaine    Comment: last use 7/22, smoked crack   Sexual activity: Yes    Partners: Male    Birth control/protection:  Condom, Post-menopausal  Other Topics Concern   Not on file  Social History Narrative   Not on file   Social Determinants of Health   Financial Resource Strain: Not on file  Food Insecurity: Not on file  Transportation Needs: Not on file  Physical Activity: Not on file  Stress: Not on file  Social Connections: Not on file   Additional Social History:    Allergies:  No Known Allergies  Labs:  Results for orders placed or performed during the hospital encounter of 10/04/21 (from the past 48 hour(s))  Comprehensive metabolic panel     Status: Abnormal   Collection Time: 10/04/21  2:05 PM  Result Value Ref Range   Sodium 137 135 - 145 mmol/L   Potassium 3.5 3.5 - 5.1 mmol/L   Chloride 106 98 - 111 mmol/L   CO2 17 (L) 22 - 32 mmol/L   Glucose, Bld 258 (H)  70 - 99 mg/dL    Comment: Glucose reference range applies only to samples taken after fasting for at least 8 hours.   BUN 11 6 - 20 mg/dL   Creatinine, Ser 0.56 0.44 - 1.00 mg/dL   Calcium 9.1 8.9 - 10.3 mg/dL   Total Protein 8.0 6.5 - 8.1 g/dL   Albumin 4.1 3.5 - 5.0 g/dL   AST 15 15 - 41 U/L   ALT 14 0 - 44 U/L   Alkaline Phosphatase 122 38 - 126 U/L   Total Bilirubin 0.8 0.3 - 1.2 mg/dL   GFR, Estimated >60 >60 mL/min    Comment: (NOTE) Calculated using the CKD-EPI Creatinine Equation (2021)    Anion gap 14 5 - 15    Comment: Performed at [facility], [address], [place], [place] [address]  Ethanol     Status: Abnormal   Collection Time: 10/04/21  2:05 PM  Result Value Ref Range   Alcohol, Ethyl (B) 56 (H) <10 mg/dL    Comment: (NOTE) Lowest detectable limit for serum alcohol is 10 mg/dL.  For medical purposes only. Performed at [facility], [address] [place], [place] [address]   Salicylate level     Status: Abnormal   Collection Time: 10/04/21  2:05 PM  Result Value Ref Range   Salicylate Lvl <9.7 (L) 7.0 - 30.0 mg/dL    Comment: Performed at [facility], [address] [place], [place] [address]  Acetaminophen level     Status: Abnormal   Collection Time: 10/04/21  2:05 PM  Result Value Ref Range   Acetaminophen (Tylenol), Serum <10 (L) 10 - 30 ug/mL    Comment: (NOTE) Therapeutic concentrations vary significantly. A range of 10-30 ug/mL  may be an effective concentration for many patients. However, some  are best treated at concentrations outside of this range. Acetaminophen concentrations >150 ug/mL at 4 hours after ingestion  and >50 ug/mL at 12 hours after ingestion are often associated with  toxic reactions.  Performed at [facility], [address] [place], [place] [address]   cbc     Status: None   Collection Time: 10/04/21  2:05 PM  Result Value Ref Range   WBC 9.3 4.0 - 10.5 K/uL   RBC 4.83 3.87  - 5.11 MIL/uL   Hemoglobin 13.4 12.0 - 15.0 g/dL   HCT 40.7 36.0 - 46.0 %   MCV 84.3 80.0 - 100.0 fL   MCH 27.7 26.0 - 34.0 pg   MCHC 32.9 30.0 - 36.0 g/dL   RDW  13.4 11.5 - 15.5 %   Platelets 285 150 - 400 K/uL   nRBC 0.0 0.0 - 0.2 %    Comment: Performed at [facility], [address] [place], [place] [address]  CBG monitoring, ED     Status: Abnormal   Collection Time: 10/04/21  4:48 PM  Result Value Ref Range   Glucose-Capillary 197 (H) 70 - 99 mg/dL    Comment: Glucose reference range applies only to samples taken after fasting for at least 8 hours.  CBG monitoring, ED     Status: Abnormal   Collection Time: 10/04/21  9:24 PM  Result Value Ref Range   Glucose-Capillary 199 (H) 70 - 99 mg/dL    Comment: Glucose reference range applies only to samples taken after fasting for at least 8 hours.  CBG monitoring, ED     Status: Abnormal   Collection Time: 10/05/21  9:24 AM  Result Value Ref Range   Glucose-Capillary 245 (H) 70 - 99 mg/dL    Comment: Glucose reference range applies only to samples taken after fasting for at least 8 hours.    Current Facility-Administered Medications  Medication Dose Route Frequency Provider Last Rate Last Admin   gabapentin (NEURONTIN) capsule 300 mg  300 mg Oral TID Harvest Dark, MD   300 mg at 10/05/21 1018   hydrOXYzine (ATARAX) tablet 25 mg  25 mg Oral TID PRN Harvest Dark, MD   25 mg at 10/04/21 1510   insulin aspart (novoLOG) injection 0-15 Units  0-15 Units Subcutaneous TID WC Harvest Dark, MD   5 Units at 10/05/21 1019   insulin glargine-yfgn (SEMGLEE) injection 10 Units  10 Units Subcutaneous q AM Harvest Dark, MD   10 Units at 10/05/21 1020   insulin glargine-yfgn (SEMGLEE) injection 30 Units  30 Units Subcutaneous QHS Harvest Dark, MD   30 Units at 10/04/21 2148   lamoTRIgine (LAMICTAL) tablet 100 mg  100 mg Oral Daily Harvest Dark, MD   100 mg at 10/05/21 1019   nicotine (NICODERM CQ - dosed  in mg/24 hours) patch 21 mg  21 mg Transdermal Daily Naaman Plummer, MD   21 mg at 10/04/21 2156   traZODone (DESYREL) tablet 100 mg  100 mg Oral Annamarie Dawley, MD   100 mg at 10/04/21 2134   Current Outpatient Medications  Medication Sig Dispense Refill   escitalopram (LEXAPRO) 20 MG tablet Take 20 mg by mouth daily.     gabapentin (NEURONTIN) 400 MG capsule Take 400 mg by mouth 2 (two) times daily.     hydrOXYzine (VISTARIL) 25 MG capsule Take 25 mg by mouth 3 (three) times daily.     insulin glargine-yfgn (SEMGLEE) 100 UNIT/ML injection Inject 0.1 mLs (10 Units total) into the skin in the morning. 10 mL 11   insulin glargine-yfgn (SEMGLEE, YFGN,) 100 UNIT/ML Pen Inject 30 Units into the skin at bedtime. 3 mL 0   traZODone (DESYREL) 100 MG tablet Take 1 tablet (100 mg total) by mouth at bedtime. 10 tablet 0   albuterol (VENTOLIN HFA) 108 (90 Base) MCG/ACT inhaler Inhale 2 puffs into the lungs once every 6 (six) hours as needed for wheezing or shortness of breath. 18 g 0   GNP ATHLETES FOOT 1 % cream Apply 1 application topically 2 (two) times daily. (Patient not taking: Reported on 10/04/2021)     insulin lispro (HUMALOG KWIKPEN) 100 UNIT/ML KwikPen Inject 3 Units into the skin 3 (three) times daily with meals. 3 mL 0  Insulin Pen Needle (UNIFINE PENTIPS) 31G X 5 MM MISC Use to administer Humalog and Semglee insulin 40 each 0   lamoTRIgine (LAMICTAL) 100 MG tablet Take 1 tablet (100 mg total) by mouth daily. 30 tablet 1   mupirocin ointment (BACTROBAN) 2 % Apply 1 application topically 2 (two) times daily. (Patient not taking: Reported on 10/04/2021)     promethazine-dextromethorphan (PROMETHAZINE-DM) 6.25-15 MG/5ML syrup Take 5 mLs by mouth 4 (four) times daily as needed for cough. (Patient not taking: Reported on 10/04/2021) 118 mL 0   triamcinolone cream (KENALOG) 0.1 % Apply 1 application topically 2 (two) times daily. (Patient not taking: Reported on 10/04/2021)       Musculoskeletal: Strength & Muscle Tone: within normal limits Gait & Station: normal Patient leans: N/A  Psychiatric Specialty Exam: Physical Exam Vitals and nursing note reviewed.  Constitutional:      Appearance: Normal appearance.  HENT:     Head: Normocephalic.     Nose: Nose normal.  Pulmonary:     Effort: Pulmonary effort is normal.  Musculoskeletal:        General: Normal range of motion.     Cervical back: Normal range of motion.  Neurological:     General: No focal deficit present.     Mental Status: She is alert and oriented to person, place, and time.  Psychiatric:        Attention and Perception: Attention and perception normal.        Mood and Affect: Mood is anxious and depressed. Affect is tearful.        Speech: Speech normal.        Behavior: Behavior normal. Behavior is cooperative.        Thought Content: Thought content includes suicidal ideation. Thought content includes suicidal plan.        Cognition and Memory: Cognition and memory normal.        Judgment: Judgment normal.     Review of Systems  Neurological:  Positive for tremors.  Psychiatric/Behavioral:  Positive for depression, substance abuse and suicidal ideas. The patient is nervous/anxious.   All other systems reviewed and are negative.   Blood pressure 114/85, pulse 89, temperature 98.3 F (36.8 C), temperature source Oral, resp. rate 16, height '5\' 6"'$  (1.676 m), weight 81.6 kg, last menstrual period 07/22/2016, SpO2 100 %.Body mass index is 29.05 kg/m.  General Appearance: Casual  Eye Contact:  Fair  Speech:  Normal Rate  Volume:  Normal  Mood:  Anxious and Depressed  Affect:  Congruent  Thought Process:  Coherent  Orientation:  Full (Time, Place, and Person)  Thought Content:  Rumination  Suicidal Thoughts:  Yes.  with intent/plan  Homicidal Thoughts:  No  Memory:  Immediate;   Good Recent;   Good Remote;   Good  Judgement:  Fair  Insight:  Fair  Psychomotor Activity:   Decreased  Concentration:  Concentration: Fair and Attention Span: Fair  Recall:  [employer] of Knowledge:  Fair  Language:  Fair  Akathisia:  No  Handed:  Right  AIMS (if indicated):     Assets:  Housing Leisure Time Physical Health Resilience  ADL's:  Intact  Cognition:  WNL  Sleep:        Physical Exam: Physical Exam Vitals and nursing note reviewed.  Constitutional:      Appearance: Normal appearance.  HENT:     Head: Normocephalic.     Nose: Nose normal.  Pulmonary:     Effort: Pulmonary  effort is normal.  Musculoskeletal:        General: Normal range of motion.     Cervical back: Normal range of motion.  Neurological:     General: No focal deficit present.     Mental Status: She is alert and oriented to person, place, and time.  Psychiatric:        Attention and Perception: Attention and perception normal.        Mood and Affect: Mood is anxious and depressed. Affect is tearful.        Speech: Speech normal.        Behavior: Behavior normal. Behavior is cooperative.        Thought Content: Thought content includes suicidal ideation. Thought content includes suicidal plan.        Cognition and Memory: Cognition and memory normal.        Judgment: Judgment normal.    Review of Systems  Neurological:  Positive for tremors.  Psychiatric/Behavioral:  Positive for depression, substance abuse and suicidal ideas. The patient is nervous/anxious.   All other systems reviewed and are negative.  Blood pressure 114/85, pulse 89, temperature 98.3 F (36.8 C), temperature source Oral, resp. rate 16, height '5\' 6"'$  (1.676 m), weight 81.6 kg, last menstrual period 07/22/2016, SpO2 100 %. Body mass index is 29.05 kg/m.  Treatment Plan Summary: Daily contact with patient to assess and evaluate symptoms and progress in treatment, Medication management, and Plan : Major depressive disorder, recurrent, severe without psychosis: Admit to inpatient psych unit  Alcohol use  disorder: Ativan alcohol detox protocol in place  Cocaine use disorder: Gabapentin 300 mg TID  Disposition: Recommend psychiatric Inpatient admission when medically cleared.  Waylan Boga, NP 10/05/2021 10:39 AM

## 2021-10-05 NOTE — ED Provider Notes (Signed)
Emergency Medicine Observation Re-evaluation Note  Julie Lambert is a 56 y.o. female, seen on rounds today.  Pt initially presented to the ED for complaints of Suicidal Currently, the patient is comfortably in bed.  She states she is feeling the beginnings of withdrawal from alcohol but denies other acute complaints.  Physical Exam  BP 114/85 (BP Location: Left Arm)   Pulse 89   Temp 98.3 F (36.8 C) (Oral)   Resp 16   Ht '5\' 6"'$  (1.676 m)   Wt 81.6 kg   LMP 07/22/2016 (Approximate)   SpO2 100%   BMI 29.05 kg/m  Physical Exam General: Comfortable appearing. Cardiac: Well perfused Lungs: Normal respiratory effort. Psych: Calm and cooperative.  ED Course / MDM  EKG:   I have reviewed the labs performed to date as well as medications administered while in observation.  There have been no significant changes in the last 24 hours.  The patient is planned for inpatient psychiatric admission.  Plan  Current plan is for admission. Julie Lambert is under involuntary commitment.      Arta Silence, MD 10/05/21 1520

## 2021-10-05 NOTE — ED Notes (Signed)
Pt provided with dinner tray.

## 2021-10-05 NOTE — ED Notes (Signed)
Pt sleeping. Will obtain vital signs when pt wakes up.  

## 2021-10-05 NOTE — ED Notes (Signed)
Patient took morning medications, she took shower, she is calm and cooperative, will cry easily, talks about her life with addiction and depression, and how she cannot get on her feet financially due to her emotional problems, states " I want help" I want treatment before I die" patient thinks about dying, but does not have a plan at this time, will continue to monitor, camera surveillance in progress for safety.

## 2021-10-05 NOTE — BH Assessment (Signed)
Patient is to be admitted to [facility] today 10/05/21 pending negative Covid results by Psychiatric Nurse Practitioner Waylan Boga.  Attending Physician will be Dr.  Weber Cooks .   Patient has been assigned to room 301, by [facility] Charge Nurse, Danyella Mcginty.     ER staff is aware of the admission: Tracie, ER Secretary   Dr. Jari Pigg, ER MD  Abigail Butts, Patient's Nurse  Ulis Rias, Patient Access.

## 2021-10-05 NOTE — ED Notes (Signed)
Pt sleep, will give snack when pt wakes up.

## 2021-10-05 NOTE — BH Assessment (Signed)
Referral information for Psychiatric Hospitalization faxed to;  [facility] ([phone]-or- [phone]),   [facility] ([phone]---[phone]),  [facility] ([phone]),   [facility] ([phone] -or- [phone]),   [facility] ([phone])  [facility] ([phone])  

## 2021-10-05 NOTE — ED Notes (Signed)
IVC/pending psych consult 

## 2021-10-05 NOTE — BH Assessment (Signed)
Patient is under review with [facility]. Awaiting accepting details.

## 2021-10-05 NOTE — ED Notes (Addendum)
Patient ate 100% of lunch and beverage, no signs of distress, no behavioral issues, will continue to monitor.

## 2021-10-06 ENCOUNTER — Other Ambulatory Visit: Payer: Self-pay

## 2021-10-06 ENCOUNTER — Encounter: Payer: Self-pay | Admitting: Psychiatry

## 2021-10-06 DIAGNOSIS — F332 Major depressive disorder, recurrent severe without psychotic features: Secondary | ICD-10-CM | POA: Diagnosis not present

## 2021-10-06 LAB — GLUCOSE, CAPILLARY
Glucose-Capillary: 226 mg/dL — ABNORMAL HIGH (ref 70–99)
Glucose-Capillary: 171 mg/dL — ABNORMAL HIGH (ref 70–99)
Glucose-Capillary: 178 mg/dL — ABNORMAL HIGH (ref 70–99)
Glucose-Capillary: 184 mg/dL — ABNORMAL HIGH (ref 70–99)

## 2021-10-06 MED ORDER — GABAPENTIN 300 MG PO CAPS
600.0000 mg | ORAL_CAPSULE | Freq: Three times a day (TID) | ORAL | Status: DC
  Administered 2021-10-06 – 2021-10-14 (×24): 600 mg via ORAL
  Filled 2021-10-06 (×24): qty 2

## 2021-10-06 MED ORDER — HYDROXYZINE HCL 50 MG PO TABS
50.0000 mg | ORAL_TABLET | ORAL | Status: DC | PRN
  Administered 2021-10-06 – 2021-10-13 (×22): 50 mg via ORAL
  Filled 2021-10-06 (×23): qty 1

## 2021-10-06 NOTE — BHH Suicide Risk Assessment (Signed)
[facility] Admission Suicide Risk Assessment   Nursing information obtained from:    Demographic factors:  NA Current Mental Status:  NA Loss Factors:  Loss of significant relationship, Decline in physical health Historical Factors:  Family history of mental illness or substance abuse Risk Reduction Factors:  Positive coping skills or problem solving skills  Total Time spent with patient: 45 minutes Principal Problem: Major depressive disorder, recurrent severe without psychotic features ([place]) Diagnosis:  Principal Problem:   Major depressive disorder, recurrent severe without psychotic features ([place]) Active Problems:   Diabetes mellitus without complication ([place])   Severe alcohol use disorder ([place])   Cocaine use disorder, moderate, dependence ([place])  Subjective Data: Patient seen and chart reviewed.  Patient known from previous encounters.  56 year old woman with history of depression and anxiety and mood lability worsened by cocaine and alcohol abuse came to the hospital with relapse into drug use and mood instability suicidal ideation.  On interview today the patient presents as tearful sad and anxious and hopeless but denies any suicidal intent or plan.  Not showing signs of psychosis.  Expresses a desire to get her medical condition treated and to get some stability.  Cooperative so far.  Continued Clinical Symptoms:  Alcohol Use Disorder Identification Test Final Score (AUDIT): 31 The "Alcohol Use Disorders Identification Test", Guidelines for Use in Primary Care, Second Edition.  World Pharmacologist [facility]). Score between 0-7:  no or low risk or alcohol related problems. Score between 8-15:  moderate risk of alcohol related problems. Score between 16-19:  high risk of alcohol related problems. Score 20 or above:  warrants further diagnostic evaluation for alcohol dependence and treatment.   CLINICAL FACTORS:   Bipolar Disorder:   Mixed State Depression:    Aggression Hopelessness Impulsivity Alcohol/Substance Abuse/Dependencies   Musculoskeletal: Strength & Muscle Tone: within normal limits Gait & Station: normal Patient leans: N/A  Psychiatric Specialty Exam:  Presentation  General Appearance: Appropriate for Environment  Eye Contact:Fair  Speech:Clear and Coherent  Speech Volume:Decreased  Handedness:Right   Mood and Affect  Mood:Depressed; Hopeless; Worthless  Affect:Congruent; Depressed; Tearful   Thought Process  Thought Processes:Coherent  Descriptions of Associations:Intact  Orientation:Full (Time, Place and Person)  Thought Content:Logical  History of Schizophrenia/Schizoaffective disorder:No  Duration of Psychotic Symptoms:No data recorded Hallucinations:No data recorded Ideas of Reference:None  Suicidal Thoughts:No data recorded Homicidal Thoughts:No data recorded  Sensorium  Memory:Immediate Fair  Judgment:Poor  Insight:Poor   Executive Functions  Concentration:Fair  Attention Span:Fair  [place]   Psychomotor Activity  Psychomotor Activity:No data recorded  Assets  Assets:Communication Skills; Desire for Improvement; Housing; Financial Resources/Insurance   Sleep  Sleep:No data recorded   Physical Exam: Physical Exam Vitals and nursing note reviewed.  Constitutional:      Appearance: Normal appearance.  HENT:     Head: Normocephalic and atraumatic.     Mouth/Throat:     Pharynx: Oropharynx is clear.  Eyes:     Pupils: Pupils are equal, round, and reactive to light.  Cardiovascular:     Rate and Rhythm: Normal rate and regular rhythm.  Pulmonary:     Effort: Pulmonary effort is normal.     Breath sounds: Normal breath sounds.  Abdominal:     General: Abdomen is flat.     Palpations: Abdomen is soft.  Musculoskeletal:        General: Normal range of motion.  Skin:    General: Skin is warm and dry.  Neurological:  General: No focal deficit present.     Mental Status: She is alert. Mental status is at baseline.  Psychiatric:        Attention and Perception: Attention normal.        Mood and Affect: Mood is anxious and depressed. Affect is angry and tearful.        Speech: Speech normal.        Behavior: Behavior is cooperative.        Thought Content: Thought content normal.        Cognition and Memory: Cognition normal.    Review of Systems  Constitutional: Negative.   HENT: Negative.    Eyes: Negative.   Respiratory: Negative.    Cardiovascular: Negative.   Gastrointestinal: Negative.   Musculoskeletal: Negative.   Skin: Negative.   Neurological: Negative.   Psychiatric/Behavioral:  Positive for depression, substance abuse and suicidal ideas. Negative for hallucinations. The patient is nervous/anxious.    Blood pressure 122/86, pulse 91, temperature 97.9 F (36.6 C), temperature source Oral, resp. rate 18, height '5\' 6"'$  (1.676 m), weight 81.7 kg, last menstrual period 07/22/2016, SpO2 100 %. Body mass index is 29.09 kg/m.   COGNITIVE FEATURES THAT CONTRIBUTE TO RISK:  Thought constriction (tunnel vision)    SUICIDE RISK:   Minimal: No identifiable suicidal ideation.  Patients presenting with no risk factors but with morbid ruminations; may be classified as minimal risk based on the severity of the depressive symptoms  PLAN OF CARE: Continue 15-minute checks.  Daily assessment of dangerousness along with treatment plan.  Restart medication.  Engage in individual and group therapy and counseling while assessing for appropriate outpatient treatment once stable  I certify that inpatient services furnished can reasonably be expected to improve the patient's condition.   Alethia Berthold, MD 10/06/2021, 11:38 AM

## 2021-10-06 NOTE — H&P (Signed)
Psychiatric Admission Assessment Adult  Patient Identification: Julie Lambert MRN:  [id number] Date of Evaluation:  10/06/2021 Chief Complaint:  Major depressive disorder, recurrent severe without psychotic features ([place]) [F33.2] Principal Diagnosis: Major depressive disorder, recurrent severe without psychotic features ([place]) Diagnosis:  Principal Problem:   Major depressive disorder, recurrent severe without psychotic features ([place]) Active Problems:   Diabetes mellitus without complication ([place])   Severe alcohol use disorder ([place])   Cocaine use disorder, moderate, dependence ([place])  History of Present Illness: Patient seen and chart reviewed.  56 year old woman with a history of mood instability depression and anxiety and substance use.  Come to the hospital stating that her mood has been much worse.  She tells me that for at least the last couple months she has been living with some people in a stressful situation.  She moved in with a woman she met at a job and then unexpectedly found herself being asked to bunk with the woman's son.  Also complains that the people there were dirty.  In any case that everything seems to have gotten on her nerves.  She has not been getting any medication or outpatient treatment.  She relapsed into cocaine and alcohol use and has been using daily.  Found her mood getting worse.  Not sleeping well for 8 days straight.  Having suicidal thoughts without any specific intent.  Denies psychotic symptoms.  Patient currently appears to be without a safe place to stay. Associated Signs/Symptoms: Depression Symptoms:  depressed mood, insomnia, psychomotor retardation, feelings of worthlessness/guilt, difficulty concentrating, hopelessness, Duration of Depression Symptoms: Greater than two weeks  (Hypo) Manic Symptoms:  Impulsivity, Irritable Mood, Labiality of Mood, Anxiety Symptoms:  Excessive Worry, Psychotic Symptoms:   No definite psychotic symptoms PTSD  Symptoms: Patient has certainly had a lot of stress and hardness in her life and has chronic irritability and difficulty being around people but it has not clear if it fits exactly PTSD Total Time spent with patient: 45 minutes  Past Psychiatric History: Past history of presentations under very similar circumstances.  Mood has been variously diagnosed as bipolar or depressed although she has never had a clear manic or psychotic episode.  Most of the mood symptoms seem to have been worsened during periods of cocaine add alcohol abuse.  History of suicidal ideation and no major attempts that I know of.  Has been treated variously with antidepressants and mood stabilizers.  Last time we discharged her it was on lamotrigine.  Patient has been referred to substance abuse rehab programs but has not followed up with them.  She tends to describe programs as not working for her when the facts are more that she has not complied with or followed up with treatment long-term  Is the patient at risk to self? Yes.    Has the patient been a risk to self in the past 6 months? Yes.    Has the patient been a risk to self within the distant past? Yes.    Is the patient a risk to others? No.  Has the patient been a risk to others in the past 6 months? Yes.    Has the patient been a risk to others within the distant past? Yes.     [place] Scale:  Flowsheet Row Admission (Current) from 10/05/2021 in [facility] from 10/04/2021 in [facility] from 07/11/2021 in [facility] at Uncertain  Risk No Risk        Prior Inpatient Therapy:   Prior Outpatient Therapy:    Alcohol Screening: 1. How often do you have a drink containing alcohol?: 4 or more times a week 2. How many drinks containing alcohol do you have on a typical day when you are drinking?: 10 or more 3. How often do you have six or more drinks on one  occasion?: Daily or almost daily AUDIT-C Score: 12 4. How often during the last year have you found that you were not able to stop drinking once you had started?: Weekly 5. How often during the last year have you failed to do what was normally expected from you because of drinking?: Weekly 6. How often during the last year have you needed a first drink in the morning to get yourself going after a heavy drinking session?: Weekly 7. How often during the last year have you had a feeling of guilt of remorse after drinking?: Daily or almost daily 8. How often during the last year have you been unable to remember what happened the night before because you had been drinking?: Daily or almost daily 9. Have you or someone else been injured as a result of your drinking?: No 10. Has a relative or friend or a doctor or another health worker been concerned about your drinking or suggested you cut down?: Yes, but not in the last year Alcohol Use Disorder Identification Test Final Score (AUDIT): 31 Alcohol Brief Interventions/Follow-up: Alcohol education/Brief advice Substance Abuse History in the last 12 months:  Yes.   Consequences of Substance Abuse: Worsening of mood and psychotic symptoms Previous Psychotropic Medications: Yes  Psychological Evaluations: Yes  Past Medical History:  Past Medical History:  Diagnosis Date   Bipolar 2 disorder ([place])    Diabetes mellitus without complication ([place])    Diverticulitis    Diverticulosis    GERD (gastroesophageal reflux disease)    Sciatica    Substance abuse ([place])    Uterine fibroid     Past Surgical History:  Procedure Laterality Date   BACK SURGERY     CHOLECYSTECTOMY     LUMBAR SPINE SURGERY     Family History:  Family History  Problem Relation Age of Onset   CAD Mother    Diabetes Mother    Heart disease Mother    Parkinson's disease Mother    Kidney disease Mother    Cancer Father        prostate cancer   Breast cancer Maternal  Grandmother 33   Breast cancer Cousin 95   Breast cancer Cousin 35   Breast cancer Cousin 44   Breast cancer Cousin 90   Family Psychiatric  History: No mental health family history identified Tobacco Screening:   Social History:  Social History   Substance and Sexual Activity  Alcohol Use Yes   Comment: once per week, 6 beers     Social History   Substance and Sexual Activity  Drug Use Not Currently   Types: "Crack" cocaine   Comment: last use 7/22, smoked crack    Additional Social History:                           Allergies:  No Known Allergies Lab Results:  Results for orders placed or performed during the hospital encounter of 10/05/21 (from the past 48 hour(s))  Glucose, capillary     Status: Abnormal   Collection Time: 10/05/21  10:11 PM  Result Value Ref Range   Glucose-Capillary 270 (H) 70 - 99 mg/dL    Comment: Glucose reference range applies only to samples taken after fasting for at least 8 hours.  Glucose, capillary     Status: Abnormal   Collection Time: 10/06/21  6:49 AM  Result Value Ref Range   Glucose-Capillary 226 (H) 70 - 99 mg/dL    Comment: Glucose reference range applies only to samples taken after fasting for at least 8 hours.  Glucose, capillary     Status: Abnormal   Collection Time: 10/06/21 11:37 AM  Result Value Ref Range   Glucose-Capillary 171 (H) 70 - 99 mg/dL    Comment: Glucose reference range applies only to samples taken after fasting for at least 8 hours.    Blood Alcohol level:  Lab Results  Component Value Date   ETH 56 (H) 10/04/2021   ETH 13 (H) 85/50/1586    Metabolic Disorder Labs:  Lab Results  Component Value Date   HGBA1C 11.4 (H) 03/18/2021   MPG 280.48 03/18/2021   MPG 240.3 03/17/2019   No results found for: "PROLACTIN" Lab Results  Component Value Date   CHOL 253 (H) 03/19/2019   TRIG 256 (H) 03/19/2019   HDL 72 03/19/2019   CHOLHDL 3.5 03/19/2019   VLDL 51 (H) 03/19/2019   LDLCALC 130 (H)  03/19/2019   LDLCALC 39 03/19/2018    Current Medications: Current Facility-Administered Medications  Medication Dose Route Frequency Provider Last Rate Last Admin   acetaminophen (TYLENOL) tablet 650 mg  650 mg Oral Q6H PRN Patrecia Pour, NP   650 mg at 10/05/21 2220   alum & mag hydroxide-simeth (MAALOX/MYLANTA) 200-200-20 MG/5ML suspension 30 mL  30 mL Oral Q4H PRN Patrecia Pour, NP       gabapentin (NEURONTIN) capsule 600 mg  600 mg Oral TID Lexus Barletta, [name], MD       hydrOXYzine (ATARAX) tablet 50 mg  50 mg Oral Q4H PRN Sherrice Creekmore T, MD       insulin aspart (novoLOG) injection 0-15 Units  0-15 Units Subcutaneous TID WC Patrecia Pour, NP   5 Units at 10/06/21 0745   insulin glargine-yfgn (SEMGLEE) injection 10 Units  10 Units Subcutaneous q AM Patrecia Pour, NP   10 Units at 10/06/21 0931   insulin glargine-yfgn (SEMGLEE) injection 30 Units  30 Units Subcutaneous QHS Patrecia Pour, NP   30 Units at 10/05/21 2302   lamoTRIgine (LAMICTAL) tablet 100 mg  100 mg Oral Daily Patrecia Pour, NP   100 mg at 10/06/21 8257   magnesium hydroxide (MILK OF MAGNESIA) suspension 30 mL  30 mL Oral Daily PRN Patrecia Pour, NP       nicotine (NICODERM CQ - dosed in mg/24 hours) patch 21 mg  21 mg Transdermal Daily Patrecia Pour, NP   21 mg at 10/06/21 4935   traZODone (DESYREL) tablet 100 mg  100 mg Oral QHS Patrecia Pour, NP   100 mg at 10/05/21 2220   PTA Medications: Medications Prior to Admission  Medication Sig Dispense Refill Last Dose   albuterol (VENTOLIN HFA) 108 (90 Base) MCG/ACT inhaler Inhale 2 puffs into the lungs once every 6 (six) hours as needed for wheezing or shortness of breath. 18 g 0    escitalopram (LEXAPRO) 20 MG tablet Take 20 mg by mouth daily.      gabapentin (NEURONTIN) 400 MG capsule Take 400 mg by mouth 2 (  two) times daily.      GNP ATHLETES FOOT 1 % cream Apply 1 application topically 2 (two) times daily. (Patient not taking: Reported on 10/04/2021)       hydrOXYzine (VISTARIL) 25 MG capsule Take 25 mg by mouth 3 (three) times daily.      insulin glargine-yfgn (SEMGLEE) 100 UNIT/ML injection Inject 0.1 mLs (10 Units total) into the skin in the morning. 10 mL 11    insulin glargine-yfgn (SEMGLEE, YFGN,) 100 UNIT/ML Pen Inject 30 Units into the skin at bedtime. 3 mL 0    insulin lispro (HUMALOG KWIKPEN) 100 UNIT/ML KwikPen Inject 3 Units into the skin 3 (three) times daily with meals. 3 mL 0    Insulin Pen Needle (UNIFINE PENTIPS) 31G X 5 MM MISC Use to administer Humalog and Semglee insulin 40 each 0    lamoTRIgine (LAMICTAL) 100 MG tablet Take 1 tablet (100 mg total) by mouth daily. 30 tablet 1    mupirocin ointment (BACTROBAN) 2 % Apply 1 application topically 2 (two) times daily. (Patient not taking: Reported on 10/04/2021)      promethazine-dextromethorphan (PROMETHAZINE-DM) 6.25-15 MG/5ML syrup Take 5 mLs by mouth 4 (four) times daily as needed for cough. (Patient not taking: Reported on 10/04/2021) 118 mL 0    traZODone (DESYREL) 100 MG tablet Take 1 tablet (100 mg total) by mouth at bedtime. 10 tablet 0    triamcinolone cream (KENALOG) 0.1 % Apply 1 application topically 2 (two) times daily. (Patient not taking: Reported on 10/04/2021)       Musculoskeletal: Strength & Muscle Tone: within normal limits Gait & Station: normal Patient leans: N/A            Psychiatric Specialty Exam:  Presentation  General Appearance: Appropriate for Environment  Eye Contact:Fair  Speech:Clear and Coherent  Speech Volume:Decreased  Handedness:Right   Mood and Affect  Mood:Depressed; Hopeless; Worthless  Affect:Congruent; Depressed; Tearful   Thought Process  Thought Processes:Coherent  Duration of Psychotic Symptoms: No data recorded Past Diagnosis of Schizophrenia or Psychoactive disorder: No  Descriptions of Associations:Intact  Orientation:Full (Time, Place and Person)  Thought Content:Logical  Hallucinations:No data  recorded Ideas of Reference:None  Suicidal Thoughts:No data recorded Homicidal Thoughts:No data recorded  Sensorium  Memory:Immediate Fair  Judgment:Poor  Insight:Poor   Executive Functions  Concentration:Fair  Attention Span:Fair  [place]   Psychomotor Activity  Psychomotor Activity:No data recorded  Assets  Assets:Communication Skills; Desire for Improvement; Housing; Financial Resources/Insurance   Sleep  Sleep:No data recorded   Physical Exam: Physical Exam Vitals and nursing note reviewed.  Constitutional:      Appearance: Normal appearance.  HENT:     Head: Normocephalic and atraumatic.     Mouth/Throat:     Pharynx: Oropharynx is clear.  Eyes:     Pupils: Pupils are equal, round, and reactive to light.  Cardiovascular:     Rate and Rhythm: Normal rate and regular rhythm.  Pulmonary:     Effort: Pulmonary effort is normal.     Breath sounds: Normal breath sounds.  Abdominal:     General: Abdomen is flat.     Palpations: Abdomen is soft.  Musculoskeletal:        General: Normal range of motion.  Skin:    General: Skin is warm and dry.  Neurological:     General: No focal deficit present.     Mental Status: She is alert. Mental status is at baseline.  Psychiatric:  Attention and Perception: Attention normal.        Mood and Affect: Mood is anxious and depressed. Affect is angry and tearful.        Speech: Speech normal.        Behavior: Behavior is agitated. Behavior is not aggressive.        Thought Content: Thought content includes suicidal ideation. Thought content does not include suicidal plan.        Cognition and Memory: Cognition normal.        Judgment: Judgment is impulsive.    Review of Systems  Constitutional: Negative.   HENT: Negative.    Eyes: Negative.   Respiratory: Negative.    Cardiovascular: Negative.   Gastrointestinal: Negative.   Musculoskeletal: Negative.    Skin: Negative.   Neurological: Negative.   Psychiatric/Behavioral:  Positive for depression, substance abuse and suicidal ideas. The patient is nervous/anxious and has insomnia.    Blood pressure 122/86, pulse 91, temperature 97.9 F (36.6 C), temperature source Oral, resp. rate 18, height '5\' 6"'  (1.676 m), weight 81.7 kg, last menstrual period 07/22/2016, SpO2 100 %. Body mass index is 29.09 kg/m.  Treatment Plan Summary: Medication management and Plan restart medication consistent with what she was taking previously.  Lamotrigine for now and gabapentin.  She is complaining of a lot of anxiety so I have increased the hydroxyzine and the gabapentin.  Blood sugars still running a little high but we will keep her on blood sugar checks as well as restarting her insulin.  Engage in individual and group therapy.  Ongoing assessment of any dangerousness while working on possible discharge options  Observation Level/Precautions:  15 minute checks  Laboratory:  Chemistry Profile  Psychotherapy:    Medications:    Consultations:    Discharge Concerns:    Estimated LOS:  Other:     Physician Treatment Plan for Primary Diagnosis: Major depressive disorder, recurrent severe without psychotic features ([place]) Long Term Goal(s): Improvement in symptoms so as ready for discharge  Short Term Goals: Ability to demonstrate self-control will improve and Ability to identify and develop effective coping behaviors will improve  Physician Treatment Plan for Secondary Diagnosis: Principal Problem:   Major depressive disorder, recurrent severe without psychotic features ([place]) Active Problems:   Diabetes mellitus without complication ([facility])   Severe alcohol use disorder ([place])   Cocaine use disorder, moderate, dependence ([place])  Long Term Goal(s): Improvement in symptoms so as ready for discharge  Short Term Goals: Compliance with prescribed medications will improve  I certify that inpatient services  furnished can reasonably be expected to improve the patient's condition.    Alethia Berthold, MD 8/27/202311:41 AM
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
## 2021-10-06 NOTE — Plan of Care (Signed)
Patient sad and tearful on approach. Patient states " I feel so hopeless about everything." Patient stayed in bed most of the shift. Denies SI,HI and AVH. PRN medications given for anxiety and headache with fair result. Appetite and energy level good. Support and encouragement given.

## 2021-10-06 NOTE — BHH Suicide Risk Assessment (Addendum)
[facility] INPATIENT:  Family/Significant Other Suicide Prevention Education  Suicide Prevention Education:  Education Completed;Julie Lambert, son,  has been identified by the patient as the family member/significant other with whom the patient will be residing, and identified as the person(s) who will aid the patient in the event of a mental health crisis (suicidal ideations/suicide attempt).  With written consent from the patient, the family member/significant other has been provided the following suicide prevention education, prior to the and/or following the discharge of the patient.  The suicide prevention education provided includes the following: Suicide risk factors Suicide prevention and interventions National Suicide Hotline telephone number [facility] assessment telephone number [facility] Emergency Assistance [place] and/or Residential Mobile Crisis Unit telephone number  Request made of family/significant other to: Remove weapons (e.g., guns, rifles, knives), all items previously/currently identified as safety concern.   Remove drugs/medications (over-the-counter, prescriptions, illicit drugs), all items previously/currently identified as a safety concern.  The family member/significant other verbalizes understanding of the suicide prevention education information provided.  The family member/significant other agrees to remove the items of safety concern listed above.  Julie Lambert has not spoken to his mother since she was admitted but she did call him prior to coming to the hospital.  They do talk daily and he will check on her after discharge.   Julie Lambert 10/06/2021, 3:32 PM

## 2021-10-06 NOTE — BHH Group Notes (Signed)
[facility] Notes:  (Nursing/MHT/Case Management/Adjunct)  Date:  10/06/2021  Time:  9:01 PM  Type of Therapy:   Wrap up  Participation Level:  Did Not Attend   Nehemiah Settle 10/06/2021, 9:01 PM

## 2021-10-06 NOTE — Tx Team (Signed)
Initial Treatment Plan 10/06/2021 6:18 AM [name] EFE:[id number]    PATIENT STRESSORS: Financial difficulties   Health problems   Substance abuse   Traumatic event     PATIENT STRENGTHS: Ability for insight  Active sense of humor  Communication skills  Motivation for treatment/growth    PATIENT IDENTIFIED PROBLEMS: Substance Abuse   Suicidal Ideation                    DISCHARGE CRITERIA:  Improved stabilization in mood, thinking, and/or behavior Medical problems require only outpatient monitoring Motivation to continue treatment in a less acute level of care  PRELIMINARY DISCHARGE PLAN: Attend 12-step recovery group Outpatient therapy Placement in alternative living arrangements  PATIENT/FAMILY INVOLVEMENT: This treatment plan has been presented to and reviewed with the patient, Julie Lambert, The patient and family have been given the opportunity to ask questions and make suggestions.  Harl Bowie, RN 10/06/2021, 6:18 AM

## 2021-10-06 NOTE — BHH Group Notes (Signed)
LCSW Wellness Group Note   10/06/2021 1:00pm  Type of Group and Topic: Psychoeducational Group:  Wellness  Participation Level:  did not attent  Description of Group  Wellness group introduces the topic and its focus on developing healthy habits across the spectrum and its relationship to a decrease in hospital admissions.  Six areas of wellness are discussed: physical, social spiritual, intellectual, occupational, and emotional.  Patients are asked to consider their current wellness habits and to identify areas of wellness where they are interested and able to focus on improvements.    Therapeutic Goals Patients will understand components of wellness and how they can positively impact overall health.  Patients will identify areas of wellness where they have developed good habits. Patients will identify areas of wellness where they would like to make improvements.    Summary of Patient Progress     Therapeutic Modalities: Cognitive Behavioral Therapy Psychoeducation    Joanne Chars, LCSW

## 2021-10-06 NOTE — Progress Notes (Signed)
Admission Note:  56 yr female who presents IVC in no acute distress for the treatment of Substance abuse suicidal ideation, and Depression. Patient appears flat and depressed, she was tearful during assessment, Probation officer offered emotional support and encouragement  Patient later was noted calm and cooperative with admission process. Patient denies SI/HI/AVH. Patient expressed sadness over the death of her mother a year ago and her battle with crack cocaine/ alcoholism on a daily basis. Patient  has Past medical Hx of Substance Abuse , depression, DM, Depression and severe alcoholism.  Patient was searched by writer and Butch Penny RN, patient was noted to have scattered flat circular rashes on lower extremities and upper arm, otherwise skin is warm and dry. In addition she was searched and no contraband found, POC and unit policies explained and understanding verbalized. Consents was obtained. Patient's blood sugar on admission was 270 she was then offered Food and fluids and medicated with insulin per HCP provider order, 15 minutes safety checks maintained will continue to closely monitor.

## 2021-10-07 ENCOUNTER — Other Ambulatory Visit: Payer: Self-pay

## 2021-10-07 DIAGNOSIS — F332 Major depressive disorder, recurrent severe without psychotic features: Secondary | ICD-10-CM | POA: Diagnosis not present

## 2021-10-07 LAB — GLUCOSE, CAPILLARY
Glucose-Capillary: 188 mg/dL — ABNORMAL HIGH (ref 70–99)
Glucose-Capillary: 290 mg/dL — ABNORMAL HIGH (ref 70–99)
Glucose-Capillary: 244 mg/dL — ABNORMAL HIGH (ref 70–99)
Glucose-Capillary: 295 mg/dL — ABNORMAL HIGH (ref 70–99)

## 2021-10-07 MED ORDER — TRAZODONE HCL 50 MG PO TABS
150.0000 mg | ORAL_TABLET | Freq: Every day | ORAL | Status: DC
  Administered 2021-10-07 – 2021-10-13 (×7): 150 mg via ORAL
  Filled 2021-10-07 (×7): qty 1

## 2021-10-07 NOTE — BHH Group Notes (Signed)
[facility] Group Notes:  (Nursing/MHT/Case Management/Adjunct)  Date:  10/07/2021  Time:  10:18 AM  Type of Therapy:   Community Meeting  Participation Level:  Active  Participation Quality:  Appropriate, Attentive, and Sharing  Affect:  Appropriate  Cognitive:  Alert and Appropriate  Insight:  Appropriate and Good  Engagement in Group:  Engaged  Modes of Intervention:  Discussion, Education, and Support  Summary of Progress/Problems:  Adela Lank Gabrian Hoque 10/07/2021, 10:18 AM

## 2021-10-07 NOTE — Group Note (Signed)
[facility] LCSW Group Therapy Note    Group Date: 10/07/2021 Start Time: 1300 End Time: 1400  Type of Therapy and Topic:  Group Therapy:  Overcoming Obstacles  Participation Level:  BHH PARTICIPATION LEVEL: Did Not Attend   Description of Group:   In this group patients will be encouraged to explore what they see as obstacles to their own wellness and recovery. They will be guided to discuss their thoughts, feelings, and behaviors related to these obstacles. The group will process together ways to cope with barriers, with attention given to specific choices patients can make. Each patient will be challenged to identify changes they are motivated to make in order to overcome their obstacles. This group will be process-oriented, with patients participating in exploration of their own experiences as well as giving and receiving support and challenge from other group members.  Therapeutic Goals: 1. Patient will identify personal and current obstacles as they relate to admission. 2. Patient will identify barriers that currently interfere with their wellness or overcoming obstacles.  3. Patient will identify feelings, thought process and behaviors related to these barriers. 4. Patient will identify two changes they are willing to make to overcome these obstacles:    Summary of Patient Progress Group not held due to acuity.   Therapeutic Modalities:   Cognitive Behavioral Therapy Solution Focused Therapy Motivational Interviewing Relapse Prevention Therapy   Shirl Harris, LCSW

## 2021-10-07 NOTE — Plan of Care (Signed)
Patient appears sad and crying spells at times but pleasant and cooperative on approach. Denies SI,HI and AVH. Had tylenol x 1 for headache. Stayed in bed most of the shift. Appropriate with staff & peers. Appetite and energy level good. ADLs maintained. Support and encouragement given.

## 2021-10-07 NOTE — Plan of Care (Signed)

## 2021-10-07 NOTE — Progress Notes (Signed)
Recreation Therapy Notes  Date: 10/07/2021   Time: 10:50 am     Location: Craft room   Behavioral response: Appropriate  Intervention Topic:  Relaxation    Discussion/Intervention:  Group content today was focused on relaxation. The group defined relaxation and identified healthy ways to relax. Individuals expressed how much time they spend relaxing. Patients expressed how much their life would be if they did not make time for themselves to relax. The group stated ways they could improve their relaxation techniques in the future.  Individuals participated in the intervention "Time to Relax" where they had a chance to experience different relaxation techniques.  Clinical Observations/Feedback: Patient came to group and defined relaxation as relaxing mind, body and spirit. She stated that she relaxes by breathing, counting and meditating. Participant expressed that relaxation is important to keep up your body and health. Individual was social with peers and staff while participating in the intervention.  Zolton Dowson LRT/CTRS        Tre Sanker 10/07/2021 1:23 PM

## 2021-10-07 NOTE — Progress Notes (Signed)
Patient pleasant and cooperative. Denies SI, HI, endorses anxiety and depression. Isolative to room most of early shift. Came out for staff. Noted talking to peers in dayroom. Appropriate with staff and peers. Patient reminiscing about purchasing her house, and how she allowed herself back on drugs and alcohol and has since lost it all. Patient with positive outlook, states she will get it back one day.  Encouragement and support provided, safety checks maintained. Medications given as prescribed. Pt receptive and remains safe on unit with q 15 min checks.

## 2021-10-07 NOTE — BHH Counselor (Signed)
Pt was given residential placement resource list for review. She was updated that [employer] requires insurance. She was encouraged to review the list and CSW would speak with her tomorrow to see which facilities she would like to be contacted. She agreed. No other concerns expressed. Contact ended without incident.   Chalmers Guest. Guerry Bruin, MSW, [place], [place] 10/07/2021 3:22 PM

## 2021-10-07 NOTE — BH IP Treatment Plan (Signed)
Interdisciplinary Treatment and Diagnostic Plan Update  10/07/2021 Time of Session: 09:23 Julie Lambert MRN: [id number]  Principal Diagnosis: Major depressive disorder, recurrent severe without psychotic features ([place])  Secondary Diagnoses: Principal Problem:   Major depressive disorder, recurrent severe without psychotic features ([place]) Active Problems:   Diabetes mellitus without complication ([place])   Severe alcohol use disorder ([place])   Cocaine use disorder, moderate, dependence ([place])   Current Medications:  Current Facility-Administered Medications  Medication Dose Route Frequency Provider Last Rate Last Admin   acetaminophen (TYLENOL) tablet 650 mg  650 mg Oral Q6H PRN Patrecia Pour, NP   650 mg at 10/07/21 1048   alum & mag hydroxide-simeth (MAALOX/MYLANTA) 200-200-20 MG/5ML suspension 30 mL  30 mL Oral Q4H PRN Patrecia Pour, NP       gabapentin (NEURONTIN) capsule 600 mg  600 mg Oral TID Clapacs, [name], MD   600 mg at 10/07/21 0758   hydrOXYzine (ATARAX) tablet 50 mg  50 mg Oral Q4H PRN Clapacs, [name], MD   50 mg at 10/07/21 0759   insulin aspart (novoLOG) injection 0-15 Units  0-15 Units Subcutaneous TID WC Patrecia Pour, NP   3 Units at 10/07/21 0757   insulin glargine-yfgn (SEMGLEE) injection 10 Units  10 Units Subcutaneous q AM Patrecia Pour, NP   10 Units at 10/06/21 0931   insulin glargine-yfgn (SEMGLEE) injection 30 Units  30 Units Subcutaneous QHS Patrecia Pour, NP   30 Units at 10/06/21 2225   lamoTRIgine (LAMICTAL) tablet 100 mg  100 mg Oral Daily Patrecia Pour, NP   100 mg at 10/07/21 8250   magnesium hydroxide (MILK OF MAGNESIA) suspension 30 mL  30 mL Oral Daily PRN Patrecia Pour, NP       nicotine (NICODERM CQ - dosed in mg/24 hours) patch 21 mg  21 mg Transdermal Daily Patrecia Pour, NP   21 mg at 10/07/21 0758   traZODone (DESYREL) tablet 150 mg  150 mg Oral QHS Clapacs, [name], MD       PTA Medications: Medications Prior to Admission   Medication Sig Dispense Refill Last Dose   albuterol (VENTOLIN HFA) 108 (90 Base) MCG/ACT inhaler Inhale 2 puffs into the lungs once every 6 (six) hours as needed for wheezing or shortness of breath. 18 g 0    escitalopram (LEXAPRO) 20 MG tablet Take 20 mg by mouth daily.      gabapentin (NEURONTIN) 400 MG capsule Take 400 mg by mouth 2 (two) times daily.      GNP ATHLETES FOOT 1 % cream Apply 1 application topically 2 (two) times daily. (Patient not taking: Reported on 10/04/2021)      hydrOXYzine (VISTARIL) 25 MG capsule Take 25 mg by mouth 3 (three) times daily.      insulin glargine-yfgn (SEMGLEE) 100 UNIT/ML injection Inject 0.1 mLs (10 Units total) into the skin in the morning. 10 mL 11    insulin glargine-yfgn (SEMGLEE, YFGN,) 100 UNIT/ML Pen Inject 30 Units into the skin at bedtime. 3 mL 0    insulin lispro (HUMALOG KWIKPEN) 100 UNIT/ML KwikPen Inject 3 Units into the skin 3 (three) times daily with meals. 3 mL 0    Insulin Pen Needle (UNIFINE PENTIPS) 31G X 5 MM MISC Use to administer Humalog and Semglee insulin 40 each 0    lamoTRIgine (LAMICTAL) 100 MG tablet Take 1 tablet (100 mg total) by mouth daily. 30 tablet 1    mupirocin ointment (BACTROBAN)  2 % Apply 1 application topically 2 (two) times daily. (Patient not taking: Reported on 10/04/2021)      promethazine-dextromethorphan (PROMETHAZINE-DM) 6.25-15 MG/5ML syrup Take 5 mLs by mouth 4 (four) times daily as needed for cough. (Patient not taking: Reported on 10/04/2021) 118 mL 0    traZODone (DESYREL) 100 MG tablet Take 1 tablet (100 mg total) by mouth at bedtime. 10 tablet 0    triamcinolone cream (KENALOG) 0.1 % Apply 1 application topically 2 (two) times daily. (Patient not taking: Reported on 10/04/2021)       Patient Stressors: Financial difficulties   Health problems   Substance abuse   Traumatic event    Patient Strengths: Ability for insight  Active sense of humor  Communication skills  Motivation for treatment/growth    Treatment Modalities: Medication Management, Group therapy, Case management,  1 to 1 session with clinician, Psychoeducation, Recreational therapy.   Physician Treatment Plan for Primary Diagnosis: Major depressive disorder, recurrent severe without psychotic features ([place]) Long Term Goal(s): Improvement in symptoms so as ready for discharge   Short Term Goals: Compliance with prescribed medications will improve Ability to demonstrate self-control will improve Ability to identify and develop effective coping behaviors will improve  Medication Management: Evaluate patient's response, side effects, and tolerance of medication regimen.  Therapeutic Interventions: 1 to 1 sessions, Unit Group sessions and Medication administration.  Evaluation of Outcomes: Not Met  Physician Treatment Plan for Secondary Diagnosis: Principal Problem:   Major depressive disorder, recurrent severe without psychotic features ([place]) Active Problems:   Diabetes mellitus without complication ([facility])   Severe alcohol use disorder ([facility])   Cocaine use disorder, moderate, dependence ([place])  Long Term Goal(s): Improvement in symptoms so as ready for discharge   Short Term Goals: Compliance with prescribed medications will improve Ability to demonstrate self-control will improve Ability to identify and develop effective coping behaviors will improve     Medication Management: Evaluate patient's response, side effects, and tolerance of medication regimen.  Therapeutic Interventions: 1 to 1 sessions, Unit Group sessions and Medication administration.  Evaluation of Outcomes: Not Met   RN Treatment Plan for Primary Diagnosis: Major depressive disorder, recurrent severe without psychotic features ([place]) Long Term Goal(s): Knowledge of disease and therapeutic regimen to maintain health will improve  Short Term Goals: Ability to remain free from injury will improve, Ability to verbalize frustration and anger  appropriately will improve, Ability to demonstrate self-control, Ability to participate in decision making will improve, Ability to verbalize feelings will improve, Ability to disclose and discuss suicidal ideas, Ability to identify and develop effective coping behaviors will improve, and Compliance with prescribed medications will improve  Medication Management: RN will administer medications as ordered by provider, will assess and evaluate patient's response and provide education to patient for prescribed medication. RN will report any adverse and/or side effects to prescribing provider.  Therapeutic Interventions: 1 on 1 counseling sessions, Psychoeducation, Medication administration, Evaluate responses to treatment, Monitor vital signs and CBGs as ordered, Perform/monitor CIWA, COWS, AIMS and Fall Risk screenings as ordered, Perform wound care treatments as ordered.  Evaluation of Outcomes: Not Met   LCSW Treatment Plan for Primary Diagnosis: Major depressive disorder, recurrent severe without psychotic features ([place]) Long Term Goal(s): Safe transition to appropriate next level of care at discharge, Engage patient in therapeutic group addressing interpersonal concerns.  Short Term Goals: Engage patient in aftercare planning with referrals and resources, Increase social support, Increase ability to appropriately verbalize feelings, Increase emotional regulation, Facilitate acceptance of  mental health diagnosis and concerns, Facilitate patient progression through stages of change regarding substance use diagnoses and concerns, Identify triggers associated with mental health/substance abuse issues, and Increase skills for wellness and recovery  Therapeutic Interventions: Assess for all discharge needs, 1 to 1 time with Social worker, Explore available resources and support systems, Assess for adequacy in community support network, Educate family and significant other(s) on suicide prevention, Complete  Psychosocial Assessment, Interpersonal group therapy.  Evaluation of Outcomes: Not Met   Progress in Treatment: Attending groups: No. Participating in groups: No. Taking medication as prescribed: Yes. Toleration medication: Yes. Family/Significant other contact made: Yes, individual(s) contacted:  son, Gillermo Murdoch. Patient understands diagnosis: Yes. Discussing patient identified problems/goals with staff: Yes. Medical problems stabilized or resolved: Yes. Denies suicidal/homicidal ideation: Yes. Issues/concerns per patient self-inventory: No. Other: none.  New problem(s) identified: No, Describe:  none identified.  New Short Term/Long Term Goal(s): detox, elimination of symptoms of psychosis, medication management for mood stabilization; elimination of SI thoughts; development of comprehensive mental wellness/sobriety plan.  Patient Goals:  "I'm gonna need some stuff for a little withdrawal. I'm hoping to be able to get into some type of rehab facility."  Discharge Plan or Barriers: CSW will assist pt with development of an appropriate aftercare/discharge plan.   Reason for Continuation of Hospitalization: Depression Medication stabilization Suicidal ideation Withdrawal symptoms  Estimated Length of Stay: 1-7 days  Last 3 [place] Suicide Severity Risk Score: Ken Caryl Admission (Current) from 10/05/2021 in [facility] ED from 10/04/2021 in [facility] from 07/11/2021 in [facility] [place] No Risk       Last PHQ 2/9 Scores:     No data to display          Scribe for Treatment Team: Shirl Harris, LCSW 10/07/2021 10:58 AM

## 2021-10-07 NOTE — Progress Notes (Signed)
Recreation Therapy Notes  INPATIENT RECREATION TR PLAN  Patient Details Name: Julie Lambert. Janicki MRN: [id number] DOB: 18-Oct-1965 Today's Date: 10/07/2021  Rec Therapy Plan Is patient appropriate for Therapeutic Recreation?: Yes Treatment times per week: at least 3 Estimated Length of Stay: 5-7 days TR Treatment/Interventions: Group participation (Comment)  Discharge Criteria Pt will be discharged from therapy if:: Discharged Treatment plan/goals/alternatives discussed and agreed upon by:: Patient/family  Discharge Summary     Song Garris 10/07/2021, 2:39 PM

## 2021-10-07 NOTE — Progress Notes (Signed)
Recreation Therapy Notes  INPATIENT RECREATION THERAPY ASSESSMENT  Patient Details Name: Paysen Goza. Balan MRN: [id number] DOB: 16-May-1965 Today's Date: 10/07/2021       Information Obtained From: Patient  Able to Participate in Assessment/Interview: Yes  Patient Presentation: Responsive  Reason for Admission (Per Patient): Active Symptoms, Substance Abuse  Patient Stressors: Other (Comment) (Living situation)  Coping Skills:   Substance Abuse, Talk  Leisure Interests (2+):  Individual - TV, Social - Family, Music - Listen  Frequency of Recreation/Participation: Weekly  Awareness of Community Resources:  No  Community Resources:     Current Use:    If no, Barriers?:    Expressed Interest in [employer] Information: Yes  County of Residence:  [place]  Patient Main Form of Transportation: Car  Patient Strengths:  Caring  Patient Identified Areas of Improvement:  Listen and accept help that is offered  Patient Goal for Hospitalization:  Gain coping skills.Get something for withdrawing and get into a program  Current SI (including self-harm):  No  Current HI:  No  Current AVH: No  Staff Intervention Plan: Group Attendance, Collaborate with Interdisciplinary Treatment Team  Consent to Intern Participation: N/A  Lajoya Dombek 10/07/2021, 2:39 PM

## 2021-10-07 NOTE — Progress Notes (Signed)
Patient alert and oriented x 4, affect is blunted thoughts are organized and coherent, she denies SI/HI/AVH, no distress noted 15 minutes safety checks maintained will continue to monitor.

## 2021-10-07 NOTE — Progress Notes (Signed)
[facility] MD Progress Note  10/07/2021 10:44 AM Julie Lambert  MRN:  [id number] Subjective: Patient seen.  Patient attended treatment team.  56 year old woman with a history of depression and substance use disorder.  Tearful at treatment team reporting she feels like her mood has gotten out of control and her life is out of control.  Requesting inpatient substance rehab programs.  Acknowledges having court dates coming up at the end of September.  No acute suicidal intent or plan.  Generally cooperative.  Blood sugars still running a little high. Principal Problem: Major depressive disorder, recurrent severe without psychotic features ([place]) Diagnosis: Principal Problem:   Major depressive disorder, recurrent severe without psychotic features ([place]) Active Problems:   Diabetes mellitus without complication (HCC)   Severe alcohol use disorder (HCC)   Cocaine use disorder, moderate, dependence ([place])  Total Time spent with patient: 30 minutes  Past Psychiatric History: Past history of depression and substance use  Past Medical History:  Past Medical History:  Diagnosis Date   Bipolar 2 disorder ([place])    Diabetes mellitus without complication ([place])    Diverticulitis    Diverticulosis    GERD (gastroesophageal reflux disease)    Sciatica    Substance abuse ([place])    Uterine fibroid     Past Surgical History:  Procedure Laterality Date   BACK SURGERY     CHOLECYSTECTOMY     LUMBAR SPINE SURGERY     Family History:  Family History  Problem Relation Age of Onset   CAD Mother    Diabetes Mother    Heart disease Mother    Parkinson's disease Mother    Kidney disease Mother    Cancer Father        prostate cancer   Breast cancer Maternal Grandmother 49   Breast cancer Cousin 14   Breast cancer Cousin 47   Breast cancer Cousin 36   Breast cancer Cousin 47   Family Psychiatric  History: See previous Social History:  Social History   Substance and Sexual Activity  Alcohol Use  Yes   Comment: once per week, 6 beers     Social History   Substance and Sexual Activity  Drug Use Not Currently   Types: "Crack" cocaine   Comment: last use 7/22, smoked crack    Social History   Socioeconomic History   Marital status: Divorced    Spouse name: Not on file   Number of children: Not on file   Years of education: Not on file   Highest education level: Not on file  Occupational History   Not on file  Tobacco Use   Smoking status: Every Day    Packs/day: 0.25    Years: 40.00    Total pack years: 10.00    Types: Cigarettes   Smokeless tobacco: Never  Vaping Use   Vaping Use: Never used  Substance and Sexual Activity   Alcohol use: Yes    Comment: once per week, 6 beers   Drug use: Not Currently    Types: "Crack" cocaine    Comment: last use 7/22, smoked crack   Sexual activity: Yes    Partners: Male    Birth control/protection: Condom, Post-menopausal  Other Topics Concern   Not on file  Social History Narrative   Not on file   Social Determinants of Health   Financial Resource Strain: Not on file  Food Insecurity: Not on file  Transportation Needs: Not on file  Physical Activity: Not on file  Stress: Not on file  Social Connections: Not on file   Additional Social History:                         Sleep: Fair  Appetite:  Fair  Current Medications: Current Facility-Administered Medications  Medication Dose Route Frequency Provider Last Rate Last Admin   acetaminophen (TYLENOL) tablet 650 mg  650 mg Oral Q6H PRN Patrecia Pour, NP   650 mg at 10/06/21 1141   alum & mag hydroxide-simeth (MAALOX/MYLANTA) 200-200-20 MG/5ML suspension 30 mL  30 mL Oral Q4H PRN Patrecia Pour, NP       gabapentin (NEURONTIN) capsule 600 mg  600 mg Oral TID Kyira Volkert, [name], MD   600 mg at 10/07/21 0758   hydrOXYzine (ATARAX) tablet 50 mg  50 mg Oral Q4H PRN Velinda Wrobel T, MD   50 mg at 10/07/21 0759   insulin aspart (novoLOG) injection 0-15 Units   0-15 Units Subcutaneous TID WC Patrecia Pour, NP   3 Units at 10/07/21 0757   insulin glargine-yfgn (SEMGLEE) injection 10 Units  10 Units Subcutaneous q AM Patrecia Pour, NP   10 Units at 10/06/21 0931   insulin glargine-yfgn (SEMGLEE) injection 30 Units  30 Units Subcutaneous QHS Patrecia Pour, NP   30 Units at 10/06/21 2225   lamoTRIgine (LAMICTAL) tablet 100 mg  100 mg Oral Daily Patrecia Pour, NP   100 mg at 10/07/21 8101   magnesium hydroxide (MILK OF MAGNESIA) suspension 30 mL  30 mL Oral Daily PRN Patrecia Pour, NP       nicotine (NICODERM CQ - dosed in mg/24 hours) patch 21 mg  21 mg Transdermal Daily Patrecia Pour, NP   21 mg at 10/07/21 7510   traZODone (DESYREL) tablet 150 mg  150 mg Oral QHS Mykeria Garman, [name], MD        Lab Results:  Results for orders placed or performed during the hospital encounter of 10/05/21 (from the past 48 hour(s))  Glucose, capillary     Status: Abnormal   Collection Time: 10/05/21 10:11 PM  Result Value Ref Range   Glucose-Capillary 270 (H) 70 - 99 mg/dL    Comment: Glucose reference range applies only to samples taken after fasting for at least 8 hours.  Glucose, capillary     Status: Abnormal   Collection Time: 10/06/21  6:49 AM  Result Value Ref Range   Glucose-Capillary 226 (H) 70 - 99 mg/dL    Comment: Glucose reference range applies only to samples taken after fasting for at least 8 hours.  Glucose, capillary     Status: Abnormal   Collection Time: 10/06/21 11:37 AM  Result Value Ref Range   Glucose-Capillary 171 (H) 70 - 99 mg/dL    Comment: Glucose reference range applies only to samples taken after fasting for at least 8 hours.  Glucose, capillary     Status: Abnormal   Collection Time: 10/06/21  4:43 PM  Result Value Ref Range   Glucose-Capillary 178 (H) 70 - 99 mg/dL    Comment: Glucose reference range applies only to samples taken after fasting for at least 8 hours.  Glucose, capillary     Status: Abnormal   Collection  Time: 10/06/21  7:55 PM  Result Value Ref Range   Glucose-Capillary 184 (H) 70 - 99 mg/dL    Comment: Glucose reference range applies only to samples taken after fasting for at least 8  hours.   Comment 1 Notify RN   Glucose, capillary     Status: Abnormal   Collection Time: 10/07/21  6:54 AM  Result Value Ref Range   Glucose-Capillary 188 (H) 70 - 99 mg/dL    Comment: Glucose reference range applies only to samples taken after fasting for at least 8 hours.   Comment 1 Notify RN     Blood Alcohol level:  Lab Results  Component Value Date   ETH 56 (H) 10/04/2021   ETH 13 (H) 61/44/3154    Metabolic Disorder Labs: Lab Results  Component Value Date   HGBA1C 11.4 (H) 03/18/2021   MPG 280.48 03/18/2021   MPG 240.3 03/17/2019   No results found for: "PROLACTIN" Lab Results  Component Value Date   CHOL 253 (H) 03/19/2019   TRIG 256 (H) 03/19/2019   HDL 72 03/19/2019   CHOLHDL 3.5 03/19/2019   VLDL 51 (H) 03/19/2019   LDLCALC 130 (H) 03/19/2019   LDLCALC 39 03/19/2018    Physical Findings: AIMS:  , ,  ,  ,    CIWA:    COWS:     Musculoskeletal: Strength & Muscle Tone: within normal limits Gait & Station: normal Patient leans: N/A  Psychiatric Specialty Exam:  Presentation  General Appearance: Appropriate for Environment  Eye Contact:Fair  Speech:Clear and Coherent  Speech Volume:Decreased  Handedness:Right   Mood and Affect  Mood:Depressed; Hopeless; Worthless  Affect:Congruent; Depressed; Tearful   Thought Process  Thought Processes:Coherent  Descriptions of Associations:Intact  Orientation:Full (Time, Place and Person)  Thought Content:Logical  History of Schizophrenia/Schizoaffective disorder:No  Duration of Psychotic Symptoms:No data recorded Hallucinations:No data recorded Ideas of Reference:None  Suicidal Thoughts:No data recorded Homicidal Thoughts:No data recorded  Sensorium  Memory:Immediate  Fair  Judgment:Poor  Insight:Poor   Executive Functions  Concentration:Fair  Attention Span:Fair  [place]   Psychomotor Activity  Psychomotor Activity:No data recorded  Assets  Assets:Communication Skills; Desire for Improvement; Housing; Financial Resources/Insurance   Sleep  Sleep:No data recorded   Physical Exam: Physical Exam Vitals and nursing note reviewed.  Constitutional:      Appearance: Normal appearance.  HENT:     Head: Normocephalic and atraumatic.     Mouth/Throat:     Pharynx: Oropharynx is clear.  Eyes:     Pupils: Pupils are equal, round, and reactive to light.  Cardiovascular:     Rate and Rhythm: Normal rate and regular rhythm.  Pulmonary:     Effort: Pulmonary effort is normal.     Breath sounds: Normal breath sounds.  Abdominal:     General: Abdomen is flat.     Palpations: Abdomen is soft.  Musculoskeletal:        General: Normal range of motion.  Skin:    General: Skin is warm and dry.  Neurological:     General: No focal deficit present.     Mental Status: She is alert. Mental status is at baseline.  Psychiatric:        Attention and Perception: Attention normal.        Mood and Affect: Mood is anxious and depressed. Affect is tearful.        Speech: Speech is delayed.        Thought Content: Thought content normal.    Review of Systems  Constitutional: Negative.   HENT: Negative.    Eyes: Negative.   Respiratory: Negative.    Cardiovascular: Negative.   Gastrointestinal: Negative.   Musculoskeletal: Negative.  Skin: Negative.   Neurological: Negative.   Psychiatric/Behavioral:  Positive for depression and substance abuse.    Blood pressure 122/86, pulse 91, temperature 97.9 F (36.6 C), temperature source Oral, resp. rate 18, height '5\' 6"'$  (1.676 m), weight 81.7 kg, last menstrual period 07/22/2016, SpO2 100 %. Body mass index is 29.09 kg/m.   Treatment Plan  Summary: Medication management and Plan increase trazodone to 150 mg at night.  We will try to reach diabetes coordinator about adjusting insulin.  Encourage group attendance and follow-up with treatment team.  We will see what we can do as far as rehab or other living situations  Alethia Berthold, MD 10/07/2021, 10:44 AM

## 2021-10-08 DIAGNOSIS — F332 Major depressive disorder, recurrent severe without psychotic features: Secondary | ICD-10-CM | POA: Diagnosis not present

## 2021-10-08 LAB — GLUCOSE, CAPILLARY
Glucose-Capillary: 190 mg/dL — ABNORMAL HIGH (ref 70–99)
Glucose-Capillary: 194 mg/dL — ABNORMAL HIGH (ref 70–99)
Glucose-Capillary: 205 mg/dL — ABNORMAL HIGH (ref 70–99)
Glucose-Capillary: 285 mg/dL — ABNORMAL HIGH (ref 70–99)

## 2021-10-08 MED ORDER — IBUPROFEN 600 MG PO TABS
600.0000 mg | ORAL_TABLET | Freq: Four times a day (QID) | ORAL | Status: DC | PRN
  Administered 2021-10-09 – 2021-10-14 (×6): 600 mg via ORAL
  Filled 2021-10-08 (×6): qty 1

## 2021-10-08 MED ORDER — NICOTINE POLACRILEX 2 MG MT GUM
2.0000 mg | CHEWING_GUM | OROMUCOSAL | Status: DC | PRN
  Administered 2021-10-08 – 2021-10-13 (×12): 2 mg via ORAL
  Filled 2021-10-08 (×14): qty 1

## 2021-10-08 MED ORDER — SIMETHICONE 80 MG PO CHEW
160.0000 mg | CHEWABLE_TABLET | Freq: Four times a day (QID) | ORAL | Status: DC | PRN
Start: 2021-10-08 — End: 2021-10-14
  Administered 2021-10-08 – 2021-10-13 (×8): 160 mg via ORAL
  Filled 2021-10-08 (×8): qty 2

## 2021-10-08 MED ORDER — CYCLOBENZAPRINE HCL 10 MG PO TABS
5.0000 mg | ORAL_TABLET | Freq: Three times a day (TID) | ORAL | Status: DC | PRN
  Administered 2021-10-08 – 2021-10-13 (×9): 5 mg via ORAL
  Filled 2021-10-08 (×9): qty 1

## 2021-10-08 NOTE — Plan of Care (Signed)

## 2021-10-08 NOTE — Plan of Care (Signed)
D: Pt alert and oriented. Pt rates depression 9/10, hopelessness 9/10, and anxiety 10/10. Pt goal: "Finding placement, speaking with the Dr." Pt reports energy level as normal and concentration as being good. Pt reports sleep last night as being poor. Pt did receive medications for sleep and did not find them helpful. Pt reports experiencing 10/10 headache and neck pain at this time, prn meds given. Pt denies experiencing any SI/HI, or AVH at this time.   A: Scheduled medications administered to pt, per MD orders. Support and encouragement provided. Frequent verbal contact made. Routine safety checks conducted q15 minutes.   R: No adverse drug reactions noted. Pt verbally contracts for safety at this time. Pt compliant with medications and treatment plan. Pt interacts well with others on the unit. Pt remains safe at this time. Plan of care ongoing.   Problem: Education: Goal: Knowledge of General Education information will improve Description: Including pain rating scale, medication(s)/side effects and non-pharmacologic comfort measures Outcome: Progressing   Problem: Coping: Goal: Level of anxiety will decrease Outcome: Not Progressing

## 2021-10-08 NOTE — Progress Notes (Signed)
[facility] MD Progress Note  10/08/2021 2:26 PM Julie Lambert  MRN:  [id number] Subjective: Follow-up for this 56 year old woman with depression and substance use problems.  Patient is up out of bed today interacting a little more.  Still reports mood as being depressed and anxious but not as tearful or agitated as she had been last time we talked.  Patient has some physical request.  Has a persistent headache with pain running down the right side of her neck.  We can treat that with pain medicine and muscle relaxer.  Also needs nicotine gum. Principal Problem: Major depressive disorder, recurrent severe without psychotic features ([place]) Diagnosis: Principal Problem:   Major depressive disorder, recurrent severe without psychotic features ([place]) Active Problems:   Diabetes mellitus without complication (HCC)   Severe alcohol use disorder (HCC)   Cocaine use disorder, moderate, dependence ([place])  Total Time spent with patient: 20 minutes  Past Psychiatric History: Past history of schizophrenia  Past Medical History:  Past Medical History:  Diagnosis Date   Bipolar 2 disorder ([place])    Diabetes mellitus without complication ([place])    Diverticulitis    Diverticulosis    GERD (gastroesophageal reflux disease)    Sciatica    Substance abuse ([place])    Uterine fibroid     Past Surgical History:  Procedure Laterality Date   BACK SURGERY     CHOLECYSTECTOMY     LUMBAR SPINE SURGERY     Family History:  Family History  Problem Relation Age of Onset   CAD Mother    Diabetes Mother    Heart disease Mother    Parkinson's disease Mother    Kidney disease Mother    Cancer Father        prostate cancer   Breast cancer Maternal Grandmother 36   Breast cancer Cousin 34   Breast cancer Cousin 66   Breast cancer Cousin 74   Breast cancer Cousin 59   Family Psychiatric  History: See previous Social History:  Social History   Substance and Sexual Activity  Alcohol Use Yes   Comment: once  per week, 6 beers     Social History   Substance and Sexual Activity  Drug Use Not Currently   Types: "Crack" cocaine   Comment: last use 7/22, smoked crack    Social History   Socioeconomic History   Marital status: Divorced    Spouse name: Not on file   Number of children: Not on file   Years of education: Not on file   Highest education level: Not on file  Occupational History   Not on file  Tobacco Use   Smoking status: Every Day    Packs/day: 0.25    Years: 40.00    Total pack years: 10.00    Types: Cigarettes   Smokeless tobacco: Never  Vaping Use   Vaping Use: Never used  Substance and Sexual Activity   Alcohol use: Yes    Comment: once per week, 6 beers   Drug use: Not Currently    Types: "Crack" cocaine    Comment: last use 7/22, smoked crack   Sexual activity: Yes    Partners: Male    Birth control/protection: Condom, Post-menopausal  Other Topics Concern   Not on file  Social History Narrative   Not on file   Social Determinants of Health   Financial Resource Strain: Not on file  Food Insecurity: Not on file  Transportation Needs: Not on file  Physical Activity: Not on  file  Stress: Not on file  Social Connections: Not on file   Additional Social History:                         Sleep: Fair  Appetite:  Fair  Current Medications: Current Facility-Administered Medications  Medication Dose Route Frequency Provider Last Rate Last Admin   acetaminophen (TYLENOL) tablet 650 mg  650 mg Oral Q6H PRN Patrecia Pour, NP   650 mg at 10/08/21 0759   alum & mag hydroxide-simeth (MAALOX/MYLANTA) 200-200-20 MG/5ML suspension 30 mL  30 mL Oral Q4H PRN Patrecia Pour, NP       cyclobenzaprine (FLEXERIL) tablet 5 mg  5 mg Oral TID PRN Cora Brierley, [name], MD       gabapentin (NEURONTIN) capsule 600 mg  600 mg Oral TID Randell Detter T, MD   600 mg at 10/08/21 1128   hydrOXYzine (ATARAX) tablet 50 mg  50 mg Oral Q4H PRN Imre Vecchione T, MD   50 mg at  10/08/21 1259   ibuprofen (ADVIL) tablet 600 mg  600 mg Oral Q6H PRN Mikeala Girdler T, MD       insulin aspart (novoLOG) injection 0-15 Units  0-15 Units Subcutaneous TID WC Patrecia Pour, NP   3 Units at 10/08/21 1129   insulin glargine-yfgn (SEMGLEE) injection 10 Units  10 Units Subcutaneous q AM Patrecia Pour, NP   10 Units at 10/08/21 1004   insulin glargine-yfgn (SEMGLEE) injection 30 Units  30 Units Subcutaneous QHS Patrecia Pour, NP   30 Units at 10/07/21 2107   lamoTRIgine (LAMICTAL) tablet 100 mg  100 mg Oral Daily Patrecia Pour, NP   100 mg at 10/08/21 0756   magnesium hydroxide (MILK OF MAGNESIA) suspension 30 mL  30 mL Oral Daily PRN Patrecia Pour, NP   30 mL at 10/07/21 2119   nicotine (NICODERM CQ - dosed in mg/24 hours) patch 21 mg  21 mg Transdermal Daily Patrecia Pour, NP   21 mg at 10/08/21 2505   nicotine polacrilex (NICORETTE) gum 2 mg  2 mg Oral PRN Suzy Kugel, [name], MD       simethicone (MYLICON) chewable tablet 160 mg  160 mg Oral QID PRN Ernestine Langworthy, [name], MD       traZODone (DESYREL) tablet 150 mg  150 mg Oral QHS Marche Hottenstein, [name], MD   150 mg at 10/07/21 2106    Lab Results:  Results for orders placed or performed during the hospital encounter of 10/05/21 (from the past 48 hour(s))  Glucose, capillary     Status: Abnormal   Collection Time: 10/06/21  4:43 PM  Result Value Ref Range   Glucose-Capillary 178 (H) 70 - 99 mg/dL    Comment: Glucose reference range applies only to samples taken after fasting for at least 8 hours.  Glucose, capillary     Status: Abnormal   Collection Time: 10/06/21  7:55 PM  Result Value Ref Range   Glucose-Capillary 184 (H) 70 - 99 mg/dL    Comment: Glucose reference range applies only to samples taken after fasting for at least 8 hours.   Comment 1 Notify RN   Glucose, capillary     Status: Abnormal   Collection Time: 10/07/21  6:54 AM  Result Value Ref Range   Glucose-Capillary 188 (H) 70 - 99 mg/dL    Comment: Glucose  reference range applies only to samples taken after fasting for at  least 8 hours.   Comment 1 Notify RN   Glucose, capillary     Status: Abnormal   Collection Time: 10/07/21 11:53 AM  Result Value Ref Range   Glucose-Capillary 290 (H) 70 - 99 mg/dL    Comment: Glucose reference range applies only to samples taken after fasting for at least 8 hours.  Glucose, capillary     Status: Abnormal   Collection Time: 10/07/21  4:41 PM  Result Value Ref Range   Glucose-Capillary 244 (H) 70 - 99 mg/dL    Comment: Glucose reference range applies only to samples taken after fasting for at least 8 hours.  Glucose, capillary     Status: Abnormal   Collection Time: 10/07/21  9:19 PM  Result Value Ref Range   Glucose-Capillary 295 (H) 70 - 99 mg/dL    Comment: Glucose reference range applies only to samples taken after fasting for at least 8 hours.  Glucose, capillary     Status: Abnormal   Collection Time: 10/08/21  6:49 AM  Result Value Ref Range   Glucose-Capillary 190 (H) 70 - 99 mg/dL    Comment: Glucose reference range applies only to samples taken after fasting for at least 8 hours.  Glucose, capillary     Status: Abnormal   Collection Time: 10/08/21 11:28 AM  Result Value Ref Range   Glucose-Capillary 194 (H) 70 - 99 mg/dL    Comment: Glucose reference range applies only to samples taken after fasting for at least 8 hours.    Blood Alcohol level:  Lab Results  Component Value Date   ETH 56 (H) 10/04/2021   ETH 13 (H) 86/76/7209    Metabolic Disorder Labs: Lab Results  Component Value Date   HGBA1C 11.4 (H) 03/18/2021   MPG 280.48 03/18/2021   MPG 240.3 03/17/2019   No results found for: "PROLACTIN" Lab Results  Component Value Date   CHOL 253 (H) 03/19/2019   TRIG 256 (H) 03/19/2019   HDL 72 03/19/2019   CHOLHDL 3.5 03/19/2019   VLDL 51 (H) 03/19/2019   LDLCALC 130 (H) 03/19/2019   LDLCALC 39 03/19/2018    Physical Findings: AIMS:  , ,  ,  ,    CIWA:    COWS:      Musculoskeletal: Strength & Muscle Tone: within normal limits Gait & Station: normal Patient leans: N/A  Psychiatric Specialty Exam:  Presentation  General Appearance: Appropriate for Environment  Eye Contact:Fair  Speech:Clear and Coherent  Speech Volume:Decreased  Handedness:Right   Mood and Affect  Mood:Depressed; Hopeless; Worthless  Affect:Congruent; Depressed; Tearful   Thought Process  Thought Processes:Coherent  Descriptions of Associations:Intact  Orientation:Full (Time, Place and Person)  Thought Content:Logical  History of Schizophrenia/Schizoaffective disorder:No  Duration of Psychotic Symptoms:No data recorded Hallucinations:No data recorded Ideas of Reference:None  Suicidal Thoughts:No data recorded Homicidal Thoughts:No data recorded  Sensorium  Memory:Immediate Fair  Judgment:Poor  Insight:Poor   Executive Functions  Concentration:Fair  Attention Span:Fair  [place]   Psychomotor Activity  Psychomotor Activity:No data recorded  Assets  Assets:Communication Skills; Desire for Improvement; Housing; Financial Resources/Insurance   Sleep  Sleep:No data recorded   Physical Exam: Physical Exam Vitals and nursing note reviewed.  Constitutional:      Appearance: Normal appearance.  HENT:     Head: Normocephalic and atraumatic.     Mouth/Throat:     Pharynx: Oropharynx is clear.  Eyes:     Pupils: Pupils are equal, round, and reactive to light.  Cardiovascular:     Rate and Rhythm: Normal rate and regular rhythm.  Pulmonary:     Effort: Pulmonary effort is normal.     Breath sounds: Normal breath sounds.  Abdominal:     General: Abdomen is flat.     Palpations: Abdomen is soft.  Musculoskeletal:        General: Normal range of motion.  Skin:    General: Skin is warm and dry.  Neurological:     General: No focal deficit present.     Mental Status: She is alert. Mental  status is at baseline.  Psychiatric:        Attention and Perception: Attention normal.        Mood and Affect: Mood is anxious. Affect is angry.        Speech: Speech normal.        Behavior: Behavior is cooperative.        Thought Content: Thought content normal.        Cognition and Memory: Cognition normal.        Judgment: Judgment normal.    Review of Systems  Constitutional: Negative.   HENT: Negative.    Eyes: Negative.   Respiratory: Negative.    Cardiovascular: Negative.   Gastrointestinal: Negative.   Musculoskeletal: Negative.   Skin: Negative.   Neurological: Negative.   Psychiatric/Behavioral:  Positive for depression.    Blood pressure (!) 146/89, pulse 92, temperature 98.4 F (36.9 C), temperature source Oral, resp. rate 18, height '5\' 6"'$  (1.676 m), weight 81.7 kg, last menstrual period 07/22/2016, SpO2 100 %. Body mass index is 29.09 kg/m.   Treatment Plan Summary: Medication management and Plan I have gone ahead and ordered Motrin as needed as well as muscle relaxant as needed as well as nicotine gum.  Patient also reports being flatulent and we can provide some medicine for that.  Encourage group attendance.  Discussed mood improvement.  Treatment team still looking into possible disposition options  Alethia Berthold, MD 10/08/2021, 2:26 PM

## 2021-10-08 NOTE — Progress Notes (Signed)
Recreation Therapy Notes   Date: 10/08/2021   Time: 10:15 am     Location: Courtyard   Behavioral response: Appropriate  Intervention Topic:  Leisure   Discussion/Intervention:  Group content today was focused on leisure. The group defined what leisure is and some positive leisure activities they participate in. Individuals identified the difference between good and bad leisure. Participants expressed how they feel after participating in the leisure of their choice. The group discussed how they go about picking a leisure activity and if others are involved in their leisure activities. The patient stated how many leisure activities they have to choose from and reasons why it is important to have leisure time. Individuals participated in the intervention "Exploration of Leisure" where they had a chance to identify new leisure activities as well as benefits of leisure. Clinical Observations/Feedback: Patient came to group and identified music, talking and getting nail/toes done as leisure activities she participates in. Individual was social with peers and staff while participating in the intervention.  Zacary Bauer LRT/CTRS        Alfredia Desanctis 10/08/2021 1:08 PM

## 2021-10-08 NOTE — Group Note (Signed)
LCSW Group Therapy Note  Group Date: 10/08/2021 Start Time: 1300 End Time: 1400   Type of Therapy and Topic:  Group Therapy: Using "I" Statements  Participation Level:  Active  Description of Group:  Patients were asked to provide details of some interpersonal conflicts they have experienced. Patients were then educated about "I" statements, communication which focuses on feelings or views of the speaker rather than what the other person is doing. T group members were asked to reflect on past conflicts and to provide specific examples for utilizing "I" statements.  Therapeutic Goals:  Patients will verbalize understanding of ineffective communication and effective communication. Patients will be able to empathize with whom they are having conflict. Patients will practice effective communication in the form of "I" statements.    Summary of Patient Progress:   Patient was present for the entirety of the group session. Patient was an active listener and participated in the topic of discussion, provided helpful advice to others, and added nuance to topic of conversation. Patient states that she has been trying to "tone it down" referring to her ability to control her reactions during communication/arguments. Acknowledges the need to use coping skills during confrontation in order to remain calm and collected.   Therapeutic Modalities:   Cognitive Behavioral Therapy Solution-Focused Therapy    Durenda Hurt, [place] 10/08/2021  2:27 PM

## 2021-10-08 NOTE — BHH Counselor (Signed)
CSW met with pt briefly to check in regarding potential rehab facilities in which she was interested. She gave CSW list with facilities circled for which she would like contacted. CSW explained that these would be contacted to figure out their referral process. She agreed. No other concerns expressed. Contact ended without incident.   CSW contacted the following places:  [facility] [phone] Contact unable to be established but HIPPA compliant voicemail left with contact information for follow through.   [facility] [phone] Contact unable to be established but HIPPA compliant voicemail left with contact information for follow through.   CSW received call back from [facility]. She informed CSW that there were three criteria that pt had to meet for admission which are not being on probation/parole, not being prescribed controlled substances, and no sex crimes/violent crimes. CSW explained that pt has been convicted of a DWI but still has to go through sentencing in September. [facility] stated that she was not sure but she stated that pt could contact her to complete a phone screening. She explained that she would need to address this concern with her supervisor and stated that she could not guarantee that it would be acceptable. Cranford Mon stated that pt could call directly to [phone] to complete screening. CSW agreed, stating that he would inform pt of the criteria and then give pt the number to contact [facility] for screening. No other concerns expressed. Contact ended without incident.   [facility] [phone] Contact unable to be established but HIPPA compliant voicemail left with contact information for follow through.   [facility] [phone] Contact unable to be established but HIPPA compliant voicemail left with contact information for follow through.   [facility] [phone] CSW spoke with Lexine Baton who stated that they will have bed availability  but that there is a waitlist. She went on to explain that the way the waitlist works is the she goes through the applications submitted in chronological order calling each applicant until she gets someone who is ready to come in for admission. She stated that so there is no specific length of time for the waitlist but it based on who picks up when she contacts them to see if they are still interested in admission. Lexine Baton also stated that they are a acute substance use facility and will not take someone with SPMI, the applicant needs to be able bodied, they will need to be able to manage their own transportation (cannot have their vehicle on campus), and it is a non-smoking facility. CSW voiced understanding. Nikki asked if an application was needed. CSW gave her his email address and stated that the pt would be updated and given the application to complete. No other concerns expressed. Contact ended without incident.   [facility] [phone] CSW was informed that they will not have any bed availability until the end of September.   [facility] [phone] CSW was informed that clinical information should be faced over to [phone].  [facility] [phone] Message stated that application would need to be sent.   [facility] [phone] CSW was informed that the nearest open bed is in [place], [place] currently. CSW was informed that pt would need to call to complete pre-screening and that they only hold bed for two days so that they would need to call close to discharge date. If the screening goes well they will call back to give pt the contact information for the director at the campus that they  are going to. CSW was also advised to inform pt that they would be responsible for their own transportation unless taking a bus or plane at which point they ([facility]) would pick them up from the depot or airport. No other concerns expressed. Contact ended without incident.   CSW  updated pt regarding phone contacts. She was given application for [facility] for completion. Pt was also informed that she would need to contact both [facility] and [facility] to complete pre-screenings. She agreed. No other concerns expressed. Contact ended without incident.   CSW will follow up with pt later in the day and also give her the application for [facility] for completion.   Chalmers Guest. Guerry Bruin, MSW, LCSW, [place] 10/08/2021 10:34 AM

## 2021-10-09 DIAGNOSIS — F332 Major depressive disorder, recurrent severe without psychotic features: Secondary | ICD-10-CM | POA: Diagnosis not present

## 2021-10-09 LAB — GLUCOSE, CAPILLARY
Glucose-Capillary: 150 mg/dL — ABNORMAL HIGH (ref 70–99)
Glucose-Capillary: 207 mg/dL — ABNORMAL HIGH (ref 70–99)
Glucose-Capillary: 129 mg/dL — ABNORMAL HIGH (ref 70–99)
Glucose-Capillary: 237 mg/dL — ABNORMAL HIGH (ref 70–99)

## 2021-10-09 MED ORDER — QUETIAPINE FUMARATE 100 MG PO TABS
100.0000 mg | ORAL_TABLET | Freq: Every day | ORAL | Status: DC
  Administered 2021-10-09: 100 mg via ORAL
  Filled 2021-10-09: qty 1

## 2021-10-09 MED ORDER — SUMATRIPTAN SUCCINATE 50 MG PO TABS
100.0000 mg | ORAL_TABLET | Freq: Once | ORAL | Status: AC
  Administered 2021-10-09: 100 mg via ORAL
  Filled 2021-10-09: qty 2

## 2021-10-09 NOTE — Progress Notes (Signed)
[facility] MD Progress Note  10/09/2021 4:15 PM Julie Lambert  MRN:  [id number] Subjective: Continues to endorse depression and anxiety.  No active suicidal ideation but feelings of hopelessness.  Also has been in bed most of the day with a headache that did not respond to pain medicine earlier Principal Problem: Major depressive disorder, recurrent severe without psychotic features ([place]) Diagnosis: Principal Problem:   Major depressive disorder, recurrent severe without psychotic features ([place]) Active Problems:   Diabetes mellitus without complication (HCC)   Severe alcohol use disorder ([place])   Cocaine use disorder, moderate, dependence ([place])  Total Time spent with patient: 30 minutes  Past Psychiatric History: Past history of chronic depression and mood instability diabetes and polysubstance use condition  Past Medical History:  Past Medical History:  Diagnosis Date   Bipolar 2 disorder ([place])    Diabetes mellitus without complication ([place])    Diverticulitis    Diverticulosis    GERD (gastroesophageal reflux disease)    Sciatica    Substance abuse ([place])    Uterine fibroid     Past Surgical History:  Procedure Laterality Date   BACK SURGERY     CHOLECYSTECTOMY     LUMBAR SPINE SURGERY     Family History:  Family History  Problem Relation Age of Onset   CAD Mother    Diabetes Mother    Heart disease Mother    Parkinson's disease Mother    Kidney disease Mother    Cancer Father        prostate cancer   Breast cancer Maternal Grandmother 67   Breast cancer Cousin 45   Breast cancer Cousin 65   Breast cancer Cousin 27   Breast cancer Cousin 80   Family Psychiatric  History: See previous Social History:  Social History   Substance and Sexual Activity  Alcohol Use Yes   Comment: once per week, 6 beers     Social History   Substance and Sexual Activity  Drug Use Not Currently   Types: "Crack" cocaine   Comment: last use 7/22, smoked crack    Social History    Socioeconomic History   Marital status: Divorced    Spouse name: Not on file   Number of children: Not on file   Years of education: Not on file   Highest education level: Not on file  Occupational History   Not on file  Tobacco Use   Smoking status: Every Day    Packs/day: 0.25    Years: 40.00    Total pack years: 10.00    Types: Cigarettes   Smokeless tobacco: Never  Vaping Use   Vaping Use: Never used  Substance and Sexual Activity   Alcohol use: Yes    Comment: once per week, 6 beers   Drug use: Not Currently    Types: "Crack" cocaine    Comment: last use 7/22, smoked crack   Sexual activity: Yes    Partners: Male    Birth control/protection: Condom, Post-menopausal  Other Topics Concern   Not on file  Social History Narrative   Not on file   Social Determinants of Health   Financial Resource Strain: Not on file  Food Insecurity: Not on file  Transportation Needs: Not on file  Physical Activity: Not on file  Stress: Not on file  Social Connections: Not on file   Additional Social History:  Sleep: Fair  Appetite:  Fair  Current Medications: Current Facility-Administered Medications  Medication Dose Route Frequency Provider Last Rate Last Admin   acetaminophen (TYLENOL) tablet 650 mg  650 mg Oral Q6H PRN Patrecia Pour, NP   650 mg at 10/08/21 0759   alum & mag hydroxide-simeth (MAALOX/MYLANTA) 200-200-20 MG/5ML suspension 30 mL  30 mL Oral Q4H PRN Patrecia Pour, NP       cyclobenzaprine (FLEXERIL) tablet 5 mg  5 mg Oral TID PRN Wanona Stare, [name], MD   5 mg at 10/09/21 1884   gabapentin (NEURONTIN) capsule 600 mg  600 mg Oral TID Armani Brar T, MD   600 mg at 10/09/21 1153   hydrOXYzine (ATARAX) tablet 50 mg  50 mg Oral Q4H PRN Timothy Trudell T, MD   50 mg at 10/09/21 1155   ibuprofen (ADVIL) tablet 600 mg  600 mg Oral Q6H PRN Kadon Andrus T, MD   600 mg at 10/09/21 1154   insulin aspart (novoLOG) injection 0-15 Units   0-15 Units Subcutaneous TID WC Patrecia Pour, NP   5 Units at 10/09/21 1154   insulin glargine-yfgn (SEMGLEE) injection 10 Units  10 Units Subcutaneous q AM Patrecia Pour, NP   10 Units at 10/09/21 1025   insulin glargine-yfgn (SEMGLEE) injection 30 Units  30 Units Subcutaneous QHS Patrecia Pour, NP   30 Units at 10/08/21 2122   magnesium hydroxide (MILK OF MAGNESIA) suspension 30 mL  30 mL Oral Daily PRN Patrecia Pour, NP   30 mL at 10/07/21 2119   nicotine (NICODERM CQ - dosed in mg/24 hours) patch 21 mg  21 mg Transdermal Daily Patrecia Pour, NP   21 mg at 10/08/21 1660   nicotine polacrilex (NICORETTE) gum 2 mg  2 mg Oral PRN Gatsby Chismar, [name], MD   2 mg at 10/09/21 1025   QUEtiapine (SEROQUEL) tablet 100 mg  100 mg Oral QHS Babe Anthis, [name], MD       simethicone (MYLICON) chewable tablet 160 mg  160 mg Oral QID PRN [employer] Corp, [name], MD   160 mg at 10/09/21 6301   SUMAtriptan (IMITREX) tablet 100 mg  100 mg Oral Once Yobani Schertzer, [name], MD       traZODone (DESYREL) tablet 150 mg  150 mg Oral QHS Fritzi Scripter, [name], MD   150 mg at 10/08/21 2122    Lab Results:  Results for orders placed or performed during the hospital encounter of 10/05/21 (from the past 48 hour(s))  Glucose, capillary     Status: Abnormal   Collection Time: 10/07/21  4:41 PM  Result Value Ref Range   Glucose-Capillary 244 (H) 70 - 99 mg/dL    Comment: Glucose reference range applies only to samples taken after fasting for at least 8 hours.  Glucose, capillary     Status: Abnormal   Collection Time: 10/07/21  9:19 PM  Result Value Ref Range   Glucose-Capillary 295 (H) 70 - 99 mg/dL    Comment: Glucose reference range applies only to samples taken after fasting for at least 8 hours.  Glucose, capillary     Status: Abnormal   Collection Time: 10/08/21  6:49 AM  Result Value Ref Range   Glucose-Capillary 190 (H) 70 - 99 mg/dL    Comment: Glucose reference range applies only to samples taken after fasting for at least 8  hours.  Glucose, capillary     Status: Abnormal   Collection Time: 10/08/21 11:28 AM  Result Value Ref Range   Glucose-Capillary 194 (H) 70 - 99 mg/dL    Comment: Glucose reference range applies only to samples taken after fasting for at least 8 hours.  Glucose, capillary     Status: Abnormal   Collection Time: 10/08/21  4:28 PM  Result Value Ref Range   Glucose-Capillary 205 (H) 70 - 99 mg/dL    Comment: Glucose reference range applies only to samples taken after fasting for at least 8 hours.  Glucose, capillary     Status: Abnormal   Collection Time: 10/08/21  8:13 PM  Result Value Ref Range   Glucose-Capillary 285 (H) 70 - 99 mg/dL    Comment: Glucose reference range applies only to samples taken after fasting for at least 8 hours.   Comment 1 Notify RN   Glucose, capillary     Status: Abnormal   Collection Time: 10/09/21  6:46 AM  Result Value Ref Range   Glucose-Capillary 150 (H) 70 - 99 mg/dL    Comment: Glucose reference range applies only to samples taken after fasting for at least 8 hours.   Comment 1 Notify RN   Glucose, capillary     Status: Abnormal   Collection Time: 10/09/21 11:50 AM  Result Value Ref Range   Glucose-Capillary 207 (H) 70 - 99 mg/dL    Comment: Glucose reference range applies only to samples taken after fasting for at least 8 hours.    Blood Alcohol level:  Lab Results  Component Value Date   ETH 56 (H) 10/04/2021   ETH 13 (H) 93/26/7124    Metabolic Disorder Labs: Lab Results  Component Value Date   HGBA1C 11.4 (H) 03/18/2021   MPG 280.48 03/18/2021   MPG 240.3 03/17/2019   No results found for: "PROLACTIN" Lab Results  Component Value Date   CHOL 253 (H) 03/19/2019   TRIG 256 (H) 03/19/2019   HDL 72 03/19/2019   CHOLHDL 3.5 03/19/2019   VLDL 51 (H) 03/19/2019   LDLCALC 130 (H) 03/19/2019   LDLCALC 39 03/19/2018    Physical Findings: AIMS:  , ,  ,  ,    CIWA:    COWS:     Musculoskeletal: Strength & Muscle Tone: within  normal limits Gait & Station: normal Patient leans: N/A  Psychiatric Specialty Exam:  Presentation  General Appearance: Appropriate for Environment  Eye Contact:Fair  Speech:Clear and Coherent  Speech Volume:Decreased  Handedness:Right   Mood and Affect  Mood:Depressed; Hopeless; Worthless  Affect:Congruent; Depressed; Tearful   Thought Process  Thought Processes:Coherent  Descriptions of Associations:Intact  Orientation:Full (Time, Place and Person)  Thought Content:Logical  History of Schizophrenia/Schizoaffective disorder:No  Duration of Psychotic Symptoms:No data recorded Hallucinations:No data recorded Ideas of Reference:None  Suicidal Thoughts:No data recorded Homicidal Thoughts:No data recorded  Sensorium  Memory:Immediate Fair  Judgment:Poor  Insight:Poor   Executive Functions  Concentration:Fair  Attention Span:Fair  [place]   Psychomotor Activity  Psychomotor Activity:No data recorded  Assets  Assets:Communication Skills; Desire for Improvement; Housing; Financial Resources/Insurance   Sleep  Sleep:No data recorded   Physical Exam: Physical Exam Vitals and nursing note reviewed.  Constitutional:      Appearance: Normal appearance.  HENT:     Head: Normocephalic and atraumatic.     Mouth/Throat:     Pharynx: Oropharynx is clear.  Eyes:     Pupils: Pupils are equal, round, and reactive to light.  Cardiovascular:     Rate and Rhythm: Normal rate and regular  rhythm.  Pulmonary:     Effort: Pulmonary effort is normal.     Breath sounds: Normal breath sounds.  Abdominal:     General: Abdomen is flat.     Palpations: Abdomen is soft.  Musculoskeletal:        General: Normal range of motion.  Skin:    General: Skin is warm and dry.  Neurological:     General: No focal deficit present.     Mental Status: She is alert. Mental status is at baseline.  Psychiatric:        Mood  and Affect: Mood normal.        Thought Content: Thought content normal.    Review of Systems  Constitutional: Negative.   HENT: Negative.    Eyes: Negative.   Respiratory: Negative.    Cardiovascular: Negative.   Gastrointestinal: Negative.   Musculoskeletal: Negative.   Skin: Negative.   Neurological:  Positive for headaches.  Psychiatric/Behavioral:  Positive for depression. The patient is nervous/anxious.    Blood pressure (!) 160/102, pulse 74, temperature 98.2 F (36.8 C), temperature source Oral, resp. rate 18, height '5\' 6"'$  (1.676 m), weight 81.7 kg, last menstrual period 07/22/2016, SpO2 100 %. Body mass index is 29.09 kg/m.   Treatment Plan Summary: Medication management and Plan offered to try migraine medicine and will give one-time dose of Imitrex to see if that helps with the headache.  Additionally patient is requesting a change from her lamotrigine which she feels has not been helpful.  Discussed options and will try switching off the lamotrigine and on the Seroquel at 100 mg starting at night for bipolar depression.  Encourage group attendance.  Encouraged her to keep working on looking for possible discharge options  Alethia Berthold, MD 10/09/2021, 4:15 PM

## 2021-10-09 NOTE — Plan of Care (Signed)
D: Pt alert and oriented. Pt rates depression 8/10, hopelessness 6/10, and anxiety 10/10. Pt goal: "Finding placement in a rehab facility." Pt reports energy level as normal and concentration as being good. Pt reports sleep last night as being poor. Pt did not receive medications for sleep. Pt reports experiencing pain at this time, prn meds given. Pt denies experiencing any SI/HI, or AVH at this time.   Pt has been tearful at times in regards to her poor choices in life and wants better. The patient is motivated to find a place to go.   A: Scheduled medications administered to pt, per MD orders. Support and encouragement provided. Frequent verbal contact made. Routine safety checks conducted q15 minutes.   R: No adverse drug reactions noted. Pt verbally contracts for safety at this time. Pt compliant with medications and treatment plan. Pt interacts well but minimally with others on the unit. Pt remains safe at this time. Plan of care ongoing.   Problem: Education: Goal: Knowledge of General Education information will improve Description: Including pain rating scale, medication(s)/side effects and non-pharmacologic comfort measures Outcome: Progressing   Problem: Coping: Goal: Level of anxiety will decrease Outcome: Not Progressing

## 2021-10-09 NOTE — Plan of Care (Signed)
  Problem: Education: Goal: Knowledge of General Education information will improve Description: Including pain rating scale, medication(s)/side effects and non-pharmacologic comfort measures Outcome: Progressing   Problem: Health Behavior/Discharge Planning: Goal: Ability to manage health-related needs will improve Outcome: Progressing   Problem: Clinical Measurements: Goal: Ability to maintain clinical measurements within normal limits will improve Outcome: Progressing Goal: Will remain free from infection Outcome: Progressing Goal: Diagnostic test results will improve Outcome: Progressing Goal: Respiratory complications will improve Outcome: Progressing Goal: Cardiovascular complication will be avoided Outcome: Progressing   Problem: Skin Integrity: Goal: Risk for impaired skin integrity will decrease Outcome: Progressing   Problem: Safety: Goal: Ability to remain free from injury will improve Outcome: Progressing   Problem: Pain Managment: Goal: General experience of comfort will improve Outcome: Progressing   Problem: Elimination: Goal: Will not experience complications related to bowel motility Outcome: Progressing Goal: Will not experience complications related to urinary retention Outcome: Progressing   Problem: Coping: Goal: Level of anxiety will decrease Outcome: Progressing   Problem: Nutrition: Goal: Adequate nutrition will be maintained Outcome: Progressing

## 2021-10-09 NOTE — Group Note (Signed)
[facility] LCSW Group Therapy Note   Group Date: 10/09/2021 Start Time: 1300 End Time: 1400   Type of Therapy/Topic:  Group Therapy:  Emotion Regulation  Participation Level:  Did Not Attend    Description of Group:    The purpose of this group is to assist patients in learning to regulate negative emotions and experience positive emotions. Patients will be guided to discuss ways in which they have been vulnerable to their negative emotions. These vulnerabilities will be juxtaposed with experiences of positive emotions or situations, and patients challenged to use positive emotions to combat negative ones. Special emphasis will be placed on coping with negative emotions in conflict situations, and patients will process healthy conflict resolution skills.  Therapeutic Goals: Patient will identify two positive emotions or experiences to reflect on in order to balance out negative emotions:  Patient will label two or more emotions that they find the most difficult to experience:  Patient will be able to demonstrate positive conflict resolution skills through discussion or role plays:   Summary of Patient Progress: Patient declined to attend group despite invitation by CSW.   Therapeutic Modalities:   Cognitive Behavioral Therapy Feelings Identification Dialectical Behavioral Therapy   Shirl Harris, LCSW

## 2021-10-09 NOTE — Progress Notes (Signed)
Recreation Therapy Notes Date: 10/09/2021   Time: 11:05 am     Location: Courtyard   Behavioral response: Appropriate  Intervention Topic:  Social Skills  Discussion/Intervention:  Group content on today was focused on social skills. The group defined social skills and identified ways they use social skills. Patients expressed what obstacles they face when trying to be social. Participants described the importance of social skills. The group listed ways to improve social skills and reasons to improve social skills. Individuals had an opportunity to learn new and improve social skills as well as identify their weaknesses. Clinical Observations/Feedback: Patient came to group and identified reasons socializing are important and who they normally socialize with. Individual was social with peers and staff while participating in the intervention.   Julie Lambert LRT/CTRS        Alfonse Garringer 10/09/2021 12:34 PM

## 2021-10-09 NOTE — Progress Notes (Signed)
Patient pleasant and cooperative.  Easy to engage in conversation. She is med compliant and received her meds without incident. Main goal was to have sleep medication for tonight, as she reported not being able to sleep the night before.  Actually complained of having night mares(terrible dreams).  Advised to make doctor aware of the bad dreams to see if medication would be beneficial.  No pain indicated at time of med pass. She denies si  hi avh but does endorse anxiety and depression at 10/10 for both.  Q15 minute safety checks in place.  Will continue to monitor.  Encouraged her to come to [facility] with any concerns.    C Butler-Nicholson

## 2021-10-09 NOTE — [facility] Group Notes (Signed)
[facility] Group Notes:  (Nursing/MHT/Case Management/Adjunct)  Date:  10/09/2021  Time:  6:40 AM  Type of Therapy:   wrap up  Participation Level:  Active  Participation Quality:  Appropriate  Affect:  Appropriate  Cognitive:  Alert  Insight:  Good  Engagement in Group:  Engaged and she said she wants the doctor and find placement.  Modes of Intervention:  Discussion and    Summary of Progress/Problems:  Floyde Parkins 10/09/2021, 6:40 AM

## 2021-10-09 NOTE — BHH Counselor (Signed)
CSW received completed application for [facility] [place]. Application was faxed over for review.   CSW received the following updates from other programs as well:  [facility] [phone] No bed availability  [facility] [phone] Pt declined this program  [facility] [phone] CSW contact [place] Sic of [facility] [phone]) to get an update regarding pt phone screening. Odum asked if pt knew what her sentencing would be. CSW explained that pt stated that she was unable to recall any conversations about what the sentence would be. CSW was advised that pt may be better served by contacting the program after her sentencing court date because it is not guaranteed that she will not be on probation/parole. Odum stated that pt could still do the screening but it would probably be turned down by director due to murky legal situation. No other concerns expressed. Contact ended without incident.   CSW will continue to follow.   Chalmers Guest. Guerry Bruin, MSW, [place], [place] 10/09/2021 2:54 PM

## 2021-10-10 DIAGNOSIS — F332 Major depressive disorder, recurrent severe without psychotic features: Secondary | ICD-10-CM | POA: Diagnosis not present

## 2021-10-10 LAB — GLUCOSE, CAPILLARY
Glucose-Capillary: 147 mg/dL — ABNORMAL HIGH (ref 70–99)
Glucose-Capillary: 291 mg/dL — ABNORMAL HIGH (ref 70–99)
Glucose-Capillary: 183 mg/dL — ABNORMAL HIGH (ref 70–99)
Glucose-Capillary: 205 mg/dL — ABNORMAL HIGH (ref 70–99)

## 2021-10-10 MED ORDER — SUMATRIPTAN SUCCINATE 50 MG PO TABS
100.0000 mg | ORAL_TABLET | ORAL | Status: DC | PRN
Start: 2021-10-10 — End: 2021-10-14
  Administered 2021-10-10 – 2021-10-13 (×7): 100 mg via ORAL
  Filled 2021-10-10 (×8): qty 2

## 2021-10-10 MED ORDER — ARIPIPRAZOLE 5 MG PO TABS
5.0000 mg | ORAL_TABLET | Freq: Every day | ORAL | Status: DC
  Administered 2021-10-10 – 2021-10-14 (×5): 5 mg via ORAL
  Filled 2021-10-10 (×5): qty 1

## 2021-10-10 MED ORDER — LORAZEPAM 2 MG PO TABS
2.0000 mg | ORAL_TABLET | Freq: Once | ORAL | Status: AC
  Administered 2021-10-10: 2 mg via ORAL
  Filled 2021-10-10: qty 1

## 2021-10-10 NOTE — Progress Notes (Signed)
Recreation Therapy Notes  Date: 10/10/2021   Time: 10:50 am    Location: Craft room      Behavioral response: N/A   Intervention Topic: Decision Making     Discussion/Intervention: Patient refused to attend group.    Clinical Observations/Feedback:  Patient refused to attend group.    Mete Purdum LRT/CTRS          Sahana Boyland 10/10/2021 11:26 AM

## 2021-10-10 NOTE — Progress Notes (Signed)
Pt presents with depressed, anxious mood, tearful. She states '' I don't know what I'm going to get when I wake up. I'm just not feeling the best today. '' Patient states she continues to feel upset by current loud milieu. She states '' ya'll need to do something about that man that stinks so bad and won't shower. It's just too much. I need to get my phone so I can start looking up my own rehab places because the list I have doesn't list some places I know. And I want to be doing my part to help my situation. '' Patient is motivated for treatment and states she would like to find a place soon '' so I'm not here for fourteen days. '' Patient completed self inventory and rates depression 10/10 on scale, 10 being worst 0 being none. Pt also rates anxiety at 10/10 on scale as well, with 10 being worst. She rates hopelessness at 7/10 with same scale. Pt states goal is to talk with social worker and work on discharge plans.  Patient was allowed into locker room and retrieved numbers. Pt is safe. Will con't to monitor.

## 2021-10-10 NOTE — Progress Notes (Addendum)
[facility] LCSW Progress Note   10/10/2021    9:19 AM  Khrystyna L. Parisi   [id number]   Type of Contact and Topic:  SUD Care Coordination   CSW attempted to reach director at [facility] [phone] option 5, option 2 regarding application/referral for patient. Unable to reach director. Left HIPAA compliant voicemail with contact information and callback request. Situation ongoing, CSW will continue to monitor and update note as more information becomes available.     Signed:  Durenda Hurt, MSW, LCSWA, LCAS 10/10/2021 9:19 AM

## 2021-10-10 NOTE — Progress Notes (Signed)
[facility] MD Progress Note  10/10/2021 12:31 PM Julie Lambert  MRN:  [id number] Subjective: Follow-up for 56 year old woman with recurrent depression.  Patient seen and chart reviewed.  Patient reports mood is a little bit better but still feels down and negative and hopeless much of the time.  Physical symptoms persist with migraines happening again today although medicine yesterday was helpful.  Patient reports that she has tried Seroquel in the past and it was impossible to tolerate because of weight gain and requests that we try something else Principal Problem: Major depressive disorder, recurrent severe without psychotic features ([place]) Diagnosis: Principal Problem:   Major depressive disorder, recurrent severe without psychotic features ([place]) Active Problems:   Diabetes mellitus without complication (HCC)   Severe alcohol use disorder (HCC)   Cocaine use disorder, moderate, dependence ([place])  Total Time spent with patient: 30 minutes  Past Psychiatric History: Past history of recurrent depression as probably part of bipolar 2 as well as substance use disorder  Past Medical History:  Past Medical History:  Diagnosis Date   Bipolar 2 disorder ([place])    Diabetes mellitus without complication ([place])    Diverticulitis    Diverticulosis    GERD (gastroesophageal reflux disease)    Sciatica    Substance abuse ([place])    Uterine fibroid     Past Surgical History:  Procedure Laterality Date   BACK SURGERY     CHOLECYSTECTOMY     LUMBAR SPINE SURGERY     Family History:  Family History  Problem Relation Age of Onset   CAD Mother    Diabetes Mother    Heart disease Mother    Parkinson's disease Mother    Kidney disease Mother    Cancer Father        prostate cancer   Breast cancer Maternal Grandmother 39   Breast cancer Cousin 6   Breast cancer Cousin 13   Breast cancer Cousin 56   Breast cancer Cousin 4   Family Psychiatric  History: See previous Social History:  Social  History   Substance and Sexual Activity  Alcohol Use Yes   Comment: once per week, 6 beers     Social History   Substance and Sexual Activity  Drug Use Not Currently   Types: "Crack" cocaine   Comment: last use 7/22, smoked crack    Social History   Socioeconomic History   Marital status: Divorced    Spouse name: Not on file   Number of children: Not on file   Years of education: Not on file   Highest education level: Not on file  Occupational History   Not on file  Tobacco Use   Smoking status: Every Day    Packs/day: 0.25    Years: 40.00    Total pack years: 10.00    Types: Cigarettes   Smokeless tobacco: Never  Vaping Use   Vaping Use: Never used  Substance and Sexual Activity   Alcohol use: Yes    Comment: once per week, 6 beers   Drug use: Not Currently    Types: "Crack" cocaine    Comment: last use 7/22, smoked crack   Sexual activity: Yes    Partners: Male    Birth control/protection: Condom, Post-menopausal  Other Topics Concern   Not on file  Social History Narrative   Not on file   Social Determinants of Health   Financial Resource Strain: Not on file  Food Insecurity: Not on file  Transportation Needs: Not on  file  Physical Activity: Not on file  Stress: Not on file  Social Connections: Not on file   Additional Social History:                         Sleep: Fair  Appetite:  Fair  Current Medications: Current Facility-Administered Medications  Medication Dose Route Frequency Provider Last Rate Last Admin   acetaminophen (TYLENOL) tablet 650 mg  650 mg Oral Q6H PRN Patrecia Pour, NP   650 mg at 10/10/21 1201   alum & mag hydroxide-simeth (MAALOX/MYLANTA) 200-200-20 MG/5ML suspension 30 mL  30 mL Oral Q4H PRN Patrecia Pour, NP       ARIPiprazole (ABILIFY) tablet 5 mg  5 mg Oral Daily [place] Decoteau T, MD       cyclobenzaprine (FLEXERIL) tablet 5 mg  5 mg Oral TID PRN Lexany Belknap, [name], MD   5 mg at 10/09/21 2147   gabapentin  (NEURONTIN) capsule 600 mg  600 mg Oral TID Meline Russaw T, MD   600 mg at 10/10/21 1159   hydrOXYzine (ATARAX) tablet 50 mg  50 mg Oral Q4H PRN Roniel Halloran T, MD   50 mg at 10/10/21 1201   ibuprofen (ADVIL) tablet 600 mg  600 mg Oral Q6H PRN Aubreyanna Dorrough T, MD   600 mg at 10/09/21 2147   insulin aspart (novoLOG) injection 0-15 Units  0-15 Units Subcutaneous TID WC Patrecia Pour, NP   8 Units at 10/10/21 1159   insulin glargine-yfgn (SEMGLEE) injection 10 Units  10 Units Subcutaneous q AM Patrecia Pour, NP   10 Units at 10/10/21 1107   insulin glargine-yfgn (SEMGLEE) injection 30 Units  30 Units Subcutaneous QHS Patrecia Pour, NP   30 Units at 10/09/21 2202   magnesium hydroxide (MILK OF MAGNESIA) suspension 30 mL  30 mL Oral Daily PRN Patrecia Pour, NP   30 mL at 10/10/21 0745   nicotine (NICODERM CQ - dosed in mg/24 hours) patch 21 mg  21 mg Transdermal Daily Patrecia Pour, NP   21 mg at 10/08/21 1308   nicotine polacrilex (NICORETTE) gum 2 mg  2 mg Oral PRN Kerstie Agent, [name], MD   2 mg at 10/10/21 0746   simethicone (MYLICON) chewable tablet 160 mg  160 mg Oral QID PRN Veronica Fretz, [name], MD   160 mg at 10/09/21 6578   SUMAtriptan (IMITREX) tablet 100 mg  100 mg Oral Q2H PRN Emina Ribaudo, [name], MD   100 mg at 10/10/21 1231   traZODone (DESYREL) tablet 150 mg  150 mg Oral QHS Keonta Monceaux, [name], MD   150 mg at 10/09/21 2147    Lab Results:  Results for orders placed or performed during the hospital encounter of 10/05/21 (from the past 48 hour(s))  Glucose, capillary     Status: Abnormal   Collection Time: 10/08/21  4:28 PM  Result Value Ref Range   Glucose-Capillary 205 (H) 70 - 99 mg/dL    Comment: Glucose reference range applies only to samples taken after fasting for at least 8 hours.  Glucose, capillary     Status: Abnormal   Collection Time: 10/08/21  8:13 PM  Result Value Ref Range   Glucose-Capillary 285 (H) 70 - 99 mg/dL    Comment: Glucose reference range applies only to  samples taken after fasting for at least 8 hours.   Comment 1 Notify RN   Glucose, capillary     Status:  Abnormal   Collection Time: 10/09/21  6:46 AM  Result Value Ref Range   Glucose-Capillary 150 (H) 70 - 99 mg/dL    Comment: Glucose reference range applies only to samples taken after fasting for at least 8 hours.   Comment 1 Notify RN   Glucose, capillary     Status: Abnormal   Collection Time: 10/09/21 11:50 AM  Result Value Ref Range   Glucose-Capillary 207 (H) 70 - 99 mg/dL    Comment: Glucose reference range applies only to samples taken after fasting for at least 8 hours.  Glucose, capillary     Status: Abnormal   Collection Time: 10/09/21  4:21 PM  Result Value Ref Range   Glucose-Capillary 129 (H) 70 - 99 mg/dL    Comment: Glucose reference range applies only to samples taken after fasting for at least 8 hours.  Glucose, capillary     Status: Abnormal   Collection Time: 10/09/21  9:52 PM  Result Value Ref Range   Glucose-Capillary 237 (H) 70 - 99 mg/dL    Comment: Glucose reference range applies only to samples taken after fasting for at least 8 hours.  Glucose, capillary     Status: Abnormal   Collection Time: 10/10/21  6:40 AM  Result Value Ref Range   Glucose-Capillary 147 (H) 70 - 99 mg/dL    Comment: Glucose reference range applies only to samples taken after fasting for at least 8 hours.  Glucose, capillary     Status: Abnormal   Collection Time: 10/10/21 11:56 AM  Result Value Ref Range   Glucose-Capillary 291 (H) 70 - 99 mg/dL    Comment: Glucose reference range applies only to samples taken after fasting for at least 8 hours.    Blood Alcohol level:  Lab Results  Component Value Date   ETH 56 (H) 10/04/2021   ETH 13 (H) 88/41/6606    Metabolic Disorder Labs: Lab Results  Component Value Date   HGBA1C 11.4 (H) 03/18/2021   MPG 280.48 03/18/2021   MPG 240.3 03/17/2019   No results found for: "PROLACTIN" Lab Results  Component Value Date   CHOL 253  (H) 03/19/2019   TRIG 256 (H) 03/19/2019   HDL 72 03/19/2019   CHOLHDL 3.5 03/19/2019   VLDL 51 (H) 03/19/2019   LDLCALC 130 (H) 03/19/2019   LDLCALC 39 03/19/2018    Physical Findings: AIMS:  , ,  ,  ,    CIWA:    COWS:     Musculoskeletal: Strength & Muscle Tone: within normal limits Gait & Station: normal Patient leans: N/A  Psychiatric Specialty Exam:  Presentation  General Appearance: Appropriate for Environment  Eye Contact:Fair  Speech:Clear and Coherent  Speech Volume:Decreased  Handedness:Right   Mood and Affect  Mood:Depressed; Hopeless; Worthless  Affect:Congruent; Depressed; Tearful   Thought Process  Thought Processes:Coherent  Descriptions of Associations:Intact  Orientation:Full (Time, Place and Person)  Thought Content:Logical  History of Schizophrenia/Schizoaffective disorder:No  Duration of Psychotic Symptoms:No data recorded Hallucinations:No data recorded Ideas of Reference:None  Suicidal Thoughts:No data recorded Homicidal Thoughts:No data recorded  Sensorium  Memory:Immediate Fair  Judgment:Poor  Insight:Poor   Executive Functions  Concentration:Fair  Attention Span:Fair  [place]   Psychomotor Activity  Psychomotor Activity:No data recorded  Assets  Assets:Communication Skills; Desire for Improvement; Housing; Financial Resources/Insurance   Sleep  Sleep:No data recorded   Physical Exam: Physical Exam Vitals and nursing note reviewed.  Constitutional:      Appearance: Normal  appearance.  HENT:     Head: Normocephalic and atraumatic.     Mouth/Throat:     Pharynx: Oropharynx is clear.  Eyes:     Pupils: Pupils are equal, round, and reactive to light.  Cardiovascular:     Rate and Rhythm: Normal rate and regular rhythm.  Pulmonary:     Effort: Pulmonary effort is normal.     Breath sounds: Normal breath sounds.  Abdominal:     General: Abdomen is  flat.     Palpations: Abdomen is soft.  Musculoskeletal:        General: Normal range of motion.  Skin:    General: Skin is warm and dry.  Neurological:     General: No focal deficit present.     Mental Status: She is alert. Mental status is at baseline.  Psychiatric:        Attention and Perception: Attention normal.        Mood and Affect: Mood is depressed. Affect is blunt.        Speech: Speech normal.        Behavior: Behavior is cooperative.        Thought Content: Thought content normal.        Cognition and Memory: Cognition normal.        Judgment: Judgment normal.    Review of Systems  Constitutional: Negative.   HENT: Negative.    Eyes: Negative.   Respiratory: Negative.    Cardiovascular: Negative.   Gastrointestinal: Negative.   Musculoskeletal: Negative.   Skin: Negative.   Neurological: Negative.   Psychiatric/Behavioral:  Positive for depression. The patient is nervous/anxious.    Blood pressure (!) 144/83, pulse (!) 102, temperature 98 F (36.7 C), temperature source Oral, resp. rate 18, height '5\' 6"'$  (1.676 m), weight 81.7 kg, last menstrual period 07/22/2016, SpO2 100 %. Body mass index is 29.09 kg/m.   Treatment Plan Summary: Medication management and Plan continue medication but discontinue the Seroquel start Abilify 5 mg a day for bipolar depression.  Engage in individual and group therapy.  Monitor labs and vitals.  Added Imitrex as a as needed medicine.  Alethia Berthold, MD 10/10/2021, 12:31 PM

## 2021-10-10 NOTE — Group Note (Signed)
[facility] LCSW Group Therapy Note   Group Date: 10/10/2021 Start Time: 1310 End Time: 1315   Type of Therapy/Topic:  Group Therapy:  Balance in Life  Participation Level:  Did Not Attend   Description of Group:    This group will address the concept of balance and how it feels and looks when one is unbalanced. Patients will be encouraged to process areas in their lives that are out of balance, and identify reasons for remaining unbalanced. Facilitators will guide patients utilizing problem- solving interventions to address and correct the stressor making their life unbalanced. Understanding and applying boundaries will be explored and addressed for obtaining  and maintaining a balanced life. Patients will be encouraged to explore ways to assertively make their unbalanced needs known to significant others in their lives, using other group members and facilitator for support and feedback.  Therapeutic Goals: Patient will identify two or more emotions or situations they have that consume much of in their lives. Patient will identify signs/triggers that life has become out of balance:  Patient will identify two ways to set boundaries in order to achieve balance in their lives:  Patient will demonstrate ability to communicate their needs through discussion and/or role plays  Summary of Patient Progress:    Group unable to be held.  CSW provided patient with worksheets.  CSW assessed for any needs and encouraged patient to follow up if additional needs are identified.     Therapeutic Modalities:   Cognitive Behavioral Therapy Solution-Focused Therapy Assertiveness Training   Rozann Lesches, LCSW

## 2021-10-11 DIAGNOSIS — F332 Major depressive disorder, recurrent severe without psychotic features: Secondary | ICD-10-CM | POA: Diagnosis not present

## 2021-10-11 LAB — GLUCOSE, CAPILLARY
Glucose-Capillary: 176 mg/dL — ABNORMAL HIGH (ref 70–99)
Glucose-Capillary: 155 mg/dL — ABNORMAL HIGH (ref 70–99)
Glucose-Capillary: 229 mg/dL — ABNORMAL HIGH (ref 70–99)
Glucose-Capillary: 256 mg/dL — ABNORMAL HIGH (ref 70–99)

## 2021-10-11 MED ORDER — CLONIDINE HCL 0.1 MG PO TABS
0.1000 mg | ORAL_TABLET | Freq: Every day | ORAL | Status: DC
  Administered 2021-10-11: 0.1 mg via ORAL
  Filled 2021-10-11: qty 1

## 2021-10-11 MED ORDER — AMLODIPINE BESYLATE 5 MG PO TABS
5.0000 mg | ORAL_TABLET | Freq: Every day | ORAL | Status: DC
  Administered 2021-10-11 – 2021-10-12 (×2): 5 mg via ORAL
  Filled 2021-10-11 (×2): qty 1

## 2021-10-11 MED ORDER — HYDRALAZINE HCL 25 MG PO TABS
25.0000 mg | ORAL_TABLET | Freq: Three times a day (TID) | ORAL | Status: DC | PRN
  Administered 2021-10-11: 25 mg via ORAL
  Filled 2021-10-11 (×2): qty 1

## 2021-10-11 MED ORDER — HYDROCHLOROTHIAZIDE 25 MG PO TABS
25.0000 mg | ORAL_TABLET | Freq: Every day | ORAL | Status: DC
  Administered 2021-10-12 – 2021-10-14 (×3): 25 mg via ORAL
  Filled 2021-10-11 (×3): qty 1

## 2021-10-11 MED ORDER — HYDROCHLOROTHIAZIDE 12.5 MG PO TABS
12.5000 mg | ORAL_TABLET | Freq: Every day | ORAL | Status: DC
  Administered 2021-10-11: 12.5 mg via ORAL
  Filled 2021-10-11: qty 1

## 2021-10-11 NOTE — Plan of Care (Signed)
D: Pt alert and oriented. Pt rates depression 10/10, hopelessness 10/10, and anxiety 10/10. Pt goal: "Getting blood pressure under control - finding placement." Pt reports energy level as low and concentration as being poor. Pt reports sleep last night as being poor. Pt did not receive medications for sleep. Pt reports experiencing 10/10 headache pain at this time, prn meds given. Pt denies experiencing any SI, or AVH at this time however, endorses experiencing HI toward the individual who packed her belongings. Pt reports being upset about her belongings being packed up and frustration with finding placement.   Pt can be tearful at times. Pt's BP has been elevated. MD notified and orders were placed and executed.     A: Scheduled medications administered to pt, per MD orders. Support and encouragement provided. Frequent verbal contact made. Routine safety checks conducted q15 minutes.   R: No adverse drug reactions noted. Pt verbally contracts for safety at this time. Pt compliant with medications and treatment plan. Pt interacts well but minimally with others on the unit. Pt remains safe at this time. Plan of care ongoing.   Problem: Education: Goal: Knowledge of General Education information will improve Description: Including pain rating scale, medication(s)/side effects and non-pharmacologic comfort measures Outcome: Progressing   Problem: Coping: Goal: Level of anxiety will decrease Outcome: Not Progressing

## 2021-10-11 NOTE — BHH Group Notes (Signed)
[facility] Group Notes:  (Nursing/MHT/Case Management/Adjunct)  Date:  10/11/2021  Time:  8:47 PM  Type of Therapy:   Wrap up  Participation Level:  Active  Participation Quality:  Appropriate  Affect:  Appropriate  Cognitive:  Alert  Insight:  Appropriate  Engagement in Group:  Want to get B/P right.  Modes of Intervention:  Support  Summary of Progress/Problems:  Julie Lambert 10/11/2021, 8:47 PM

## 2021-10-11 NOTE — Plan of Care (Signed)
  Problem: Education: Goal: Knowledge of General Education information will improve Description: Including pain rating scale, medication(s)/side effects and non-pharmacologic comfort measures Outcome: Progressing   Problem: Health Behavior/Discharge Planning: Goal: Ability to manage health-related needs will improve Outcome: Progressing   Problem: Clinical Measurements: Goal: Ability to maintain clinical measurements within normal limits will improve Outcome: Progressing Goal: Will remain free from infection Outcome: Progressing Goal: Diagnostic test results will improve Outcome: Progressing Goal: Respiratory complications will improve Outcome: Progressing Goal: Cardiovascular complication will be avoided Outcome: Progressing   Problem: Skin Integrity: Goal: Risk for impaired skin integrity will decrease Outcome: Progressing   Problem: Safety: Goal: Ability to remain free from injury will improve Outcome: Progressing   Problem: Pain Managment: Goal: General experience of comfort will improve Outcome: Progressing   Problem: Elimination: Goal: Will not experience complications related to bowel motility Outcome: Progressing Goal: Will not experience complications related to urinary retention Outcome: Progressing   Problem: Coping: Goal: Level of anxiety will decrease Outcome: Progressing

## 2021-10-11 NOTE — BHH Counselor (Signed)
CSW met with pt briefly regarding applications. She stated that she needed another copy of the [facility] application. CSW stated he would get this to her. No other concerns expressed. Contact ended without incident.   Pt received copy of [facility] application.   CSW received voicemail from [place] him that application was on their website. CSW printed out application and left with nurse for the pt.   CSW met with pt briefly to check on the status of the applications. Pt stated that she was not interested in that program because she would not be allowed to have her phone. MHT present during this interaction explained to pt that this was common in rehab programs. Pt voiced understanding. No other concerns expressed. Contact ended without incident.   Chalmers Guest. Guerry Bruin, MSW, [place], [place] 10/11/2021 4:47 PM

## 2021-10-11 NOTE — Progress Notes (Signed)
[facility] MD Progress Note  10/11/2021 3:30 PM Julie Lambert  MRN:  [id number] Subjective: Follow-up 56 year old woman with major depression.  Continues to be tearful and anxious much of the time.  Feelings of hopelessness.  Frustrated.  Some trouble sleeping.  Headaches much of the time.  Patient denies any suicidal intent but is unable to formulate a clear plan for the future and feels she needs to go to rehab Principal Problem: Major depressive disorder, recurrent severe without psychotic features ([place]) Diagnosis: Principal Problem:   Major depressive disorder, recurrent severe without psychotic features ([place]) Active Problems:   Diabetes mellitus without complication (HCC)   Severe alcohol use disorder (HCC)   Cocaine use disorder, moderate, dependence ([place])  Total Time spent with patient: 30 minutes  Past Psychiatric History: Past history of chronic depression and anxiety and substance use problems as well as medical issues  Past Medical History:  Past Medical History:  Diagnosis Date   Bipolar 2 disorder ([place])    Diabetes mellitus without complication ([place])    Diverticulitis    Diverticulosis    GERD (gastroesophageal reflux disease)    Sciatica    Substance abuse ([place])    Uterine fibroid     Past Surgical History:  Procedure Laterality Date   BACK SURGERY     CHOLECYSTECTOMY     LUMBAR SPINE SURGERY     Family History:  Family History  Problem Relation Age of Onset   CAD Mother    Diabetes Mother    Heart disease Mother    Parkinson's disease Mother    Kidney disease Mother    Cancer Father        prostate cancer   Breast cancer Maternal Grandmother 63   Breast cancer Cousin 11   Breast cancer Cousin 57   Breast cancer Cousin 88   Breast cancer Cousin 35   Family Psychiatric  History: See previous Social History:  Social History   Substance and Sexual Activity  Alcohol Use Yes   Comment: once per week, 6 beers     Social History   Substance and Sexual  Activity  Drug Use Not Currently   Types: "Crack" cocaine   Comment: last use 7/22, smoked crack    Social History   Socioeconomic History   Marital status: Divorced    Spouse name: Not on file   Number of children: Not on file   Years of education: Not on file   Highest education level: Not on file  Occupational History   Not on file  Tobacco Use   Smoking status: Every Day    Packs/day: 0.25    Years: 40.00    Total pack years: 10.00    Types: Cigarettes   Smokeless tobacco: Never  Vaping Use   Vaping Use: Never used  Substance and Sexual Activity   Alcohol use: Yes    Comment: once per week, 6 beers   Drug use: Not Currently    Types: "Crack" cocaine    Comment: last use 7/22, smoked crack   Sexual activity: Yes    Partners: Male    Birth control/protection: Condom, Post-menopausal  Other Topics Concern   Not on file  Social History Narrative   Not on file   Social Determinants of Health   Financial Resource Strain: Not on file  Food Insecurity: Not on file  Transportation Needs: Not on file  Physical Activity: Not on file  Stress: Not on file  Social Connections: Not on file  Additional Social History:                         Sleep: Fair  Appetite:  Fair  Current Medications: Current Facility-Administered Medications  Medication Dose Route Frequency Provider Last Rate Last Admin   acetaminophen (TYLENOL) tablet 650 mg  650 mg Oral Q6H PRN Patrecia Pour, NP   650 mg at 10/10/21 1201   alum & mag hydroxide-simeth (MAALOX/MYLANTA) 200-200-20 MG/5ML suspension 30 mL  30 mL Oral Q4H PRN Patrecia Pour, NP       ARIPiprazole (ABILIFY) tablet 5 mg  5 mg Oral Daily Diego Ulbricht, [name], MD   5 mg at 10/11/21 6144   cloNIDine (CATAPRES) tablet 0.1 mg  0.1 mg Oral Daily Patrecia Pour, NP   0.1 mg at 10/11/21 1217   cyclobenzaprine (FLEXERIL) tablet 5 mg  5 mg Oral TID PRN Marua Qin, [name], MD   5 mg at 10/11/21 3154   gabapentin (NEURONTIN) capsule  600 mg  600 mg Oral TID Tynasia Mccaul T, MD   600 mg at 10/11/21 1153   hydrochlorothiazide (HYDRODIURIL) tablet 12.5 mg  12.5 mg Oral Daily Patrecia Pour, NP   12.5 mg at 10/11/21 1217   hydrOXYzine (ATARAX) tablet 50 mg  50 mg Oral Q4H PRN Jailen Coward T, MD   50 mg at 10/11/21 1259   ibuprofen (ADVIL) tablet 600 mg  600 mg Oral Q6H PRN Eladia Frame T, MD   600 mg at 10/09/21 2147   insulin aspart (novoLOG) injection 0-15 Units  0-15 Units Subcutaneous TID WC Patrecia Pour, NP   3 Units at 10/11/21 1153   insulin glargine-yfgn (SEMGLEE) injection 10 Units  10 Units Subcutaneous q AM Patrecia Pour, NP   10 Units at 10/11/21 0914   insulin glargine-yfgn (SEMGLEE) injection 30 Units  30 Units Subcutaneous QHS Patrecia Pour, NP   30 Units at 10/10/21 2219   magnesium hydroxide (MILK OF MAGNESIA) suspension 30 mL  30 mL Oral Daily PRN Patrecia Pour, NP   30 mL at 10/10/21 0745   nicotine (NICODERM CQ - dosed in mg/24 hours) patch 21 mg  21 mg Transdermal Daily Patrecia Pour, NP   21 mg at 10/08/21 0086   nicotine polacrilex (NICORETTE) gum 2 mg  2 mg Oral PRN Mozetta Murfin, [name], MD   2 mg at 10/11/21 1259   simethicone (MYLICON) chewable tablet 160 mg  160 mg Oral QID PRN Derral Colucci, [name], MD   160 mg at 10/11/21 0815   SUMAtriptan (IMITREX) tablet 100 mg  100 mg Oral Q2H PRN Saachi Zale, [name], MD   100 mg at 10/11/21 7619   traZODone (DESYREL) tablet 150 mg  150 mg Oral QHS Yer Castello, [name], MD   150 mg at 10/10/21 2125    Lab Results:  Results for orders placed or performed during the hospital encounter of 10/05/21 (from the past 48 hour(s))  Glucose, capillary     Status: Abnormal   Collection Time: 10/09/21  4:21 PM  Result Value Ref Range   Glucose-Capillary 129 (H) 70 - 99 mg/dL    Comment: Glucose reference range applies only to samples taken after fasting for at least 8 hours.  Glucose, capillary     Status: Abnormal   Collection Time: 10/09/21  9:52 PM  Result Value Ref Range    Glucose-Capillary 237 (H) 70 - 99 mg/dL    Comment: Glucose  reference range applies only to samples taken after fasting for at least 8 hours.  Glucose, capillary     Status: Abnormal   Collection Time: 10/10/21  6:40 AM  Result Value Ref Range   Glucose-Capillary 147 (H) 70 - 99 mg/dL    Comment: Glucose reference range applies only to samples taken after fasting for at least 8 hours.  Glucose, capillary     Status: Abnormal   Collection Time: 10/10/21 11:56 AM  Result Value Ref Range   Glucose-Capillary 291 (H) 70 - 99 mg/dL    Comment: Glucose reference range applies only to samples taken after fasting for at least 8 hours.  Glucose, capillary     Status: Abnormal   Collection Time: 10/10/21  4:18 PM  Result Value Ref Range   Glucose-Capillary 183 (H) 70 - 99 mg/dL    Comment: Glucose reference range applies only to samples taken after fasting for at least 8 hours.  Glucose, capillary     Status: Abnormal   Collection Time: 10/10/21  9:51 PM  Result Value Ref Range   Glucose-Capillary 205 (H) 70 - 99 mg/dL    Comment: Glucose reference range applies only to samples taken after fasting for at least 8 hours.  Glucose, capillary     Status: Abnormal   Collection Time: 10/11/21  6:34 AM  Result Value Ref Range   Glucose-Capillary 176 (H) 70 - 99 mg/dL    Comment: Glucose reference range applies only to samples taken after fasting for at least 8 hours.  Glucose, capillary     Status: Abnormal   Collection Time: 10/11/21 11:50 AM  Result Value Ref Range   Glucose-Capillary 155 (H) 70 - 99 mg/dL    Comment: Glucose reference range applies only to samples taken after fasting for at least 8 hours.    Blood Alcohol level:  Lab Results  Component Value Date   ETH 56 (H) 10/04/2021   ETH 13 (H) 62/37/6283    Metabolic Disorder Labs: Lab Results  Component Value Date   HGBA1C 11.4 (H) 03/18/2021   MPG 280.48 03/18/2021   MPG 240.3 03/17/2019   No results found for:  "PROLACTIN" Lab Results  Component Value Date   CHOL 253 (H) 03/19/2019   TRIG 256 (H) 03/19/2019   HDL 72 03/19/2019   CHOLHDL 3.5 03/19/2019   VLDL 51 (H) 03/19/2019   LDLCALC 130 (H) 03/19/2019   LDLCALC 39 03/19/2018    Physical Findings: AIMS:  , ,  ,  ,    CIWA:    COWS:     Musculoskeletal: Strength & Muscle Tone: within normal limits Gait & Station: normal Patient leans: N/A  Psychiatric Specialty Exam:  Presentation  General Appearance: Appropriate for Environment  Eye Contact:Fair  Speech:Clear and Coherent  Speech Volume:Decreased  Handedness:Right   Mood and Affect  Mood:Depressed; Hopeless; Worthless  Affect:Congruent; Depressed; Tearful   Thought Process  Thought Processes:Coherent  Descriptions of Associations:Intact  Orientation:Full (Time, Place and Person)  Thought Content:Logical  History of Schizophrenia/Schizoaffective disorder:No  Duration of Psychotic Symptoms:No data recorded Hallucinations:No data recorded Ideas of Reference:None  Suicidal Thoughts:No data recorded Homicidal Thoughts:No data recorded  Sensorium  Memory:Immediate Fair  Judgment:Poor  Insight:Poor   Executive Functions  Concentration:Fair  Attention Span:Fair  [place]   Psychomotor Activity  Psychomotor Activity:No data recorded  Assets  Assets:Communication Skills; Desire for Improvement; Housing; Financial Resources/Insurance   Sleep  Sleep:No data recorded   Physical Exam: Physical  Exam Vitals and nursing note reviewed.  Constitutional:      Appearance: Normal appearance.  HENT:     Head: Normocephalic and atraumatic.     Mouth/Throat:     Pharynx: Oropharynx is clear.  Eyes:     Pupils: Pupils are equal, round, and reactive to light.  Cardiovascular:     Rate and Rhythm: Normal rate and regular rhythm.  Pulmonary:     Effort: Pulmonary effort is normal.     Breath sounds:  Normal breath sounds.  Abdominal:     General: Abdomen is flat.     Palpations: Abdomen is soft.  Musculoskeletal:        General: Normal range of motion.  Skin:    General: Skin is warm and dry.  Neurological:     General: No focal deficit present.     Mental Status: She is alert. Mental status is at baseline.  Psychiatric:        Attention and Perception: Attention normal.        Mood and Affect: Mood is anxious. Affect is tearful.        Speech: Speech is tangential.        Behavior: Behavior is agitated. Behavior is not aggressive.        Thought Content: Thought content normal.    Review of Systems  Constitutional: Negative.   HENT: Negative.    Eyes: Negative.   Respiratory: Negative.    Cardiovascular: Negative.   Gastrointestinal: Negative.   Musculoskeletal: Negative.   Skin: Negative.   Neurological: Negative.   Psychiatric/Behavioral:  Positive for depression. The patient is nervous/anxious.    Blood pressure (!) 158/94, pulse 88, temperature 98.7 F (37.1 C), temperature source Oral, resp. rate 18, height '5\' 6"'$  (1.676 m), weight 81.7 kg, last menstrual period 07/22/2016, SpO2 100 %. Body mass index is 29.09 kg/m.   Treatment Plan Summary: Medication management and Plan blood pressure persistently elevated increased hydrochlorothiazide to 25 mg and added amlodipine 5 mg.  Continue current psychiatric medicine.  Engage in individual daily groups and activities.  Work with patient on discharge options.  Daily assessment of dangerousness  Alethia Berthold, MD 10/11/2021, 3:30 PM

## 2021-10-11 NOTE — BHH Group Notes (Signed)
[facility] Group Notes:  (Nursing/MHT/Case Management/Adjunct)  Date:  10/11/2021  Time:  9:36 AM  Type of Therapy:   community meeting  Participation Level:  Did Not Attend    Antonieta Pert 10/11/2021, 9:36 AM

## 2021-10-11 NOTE — Group Note (Signed)
[facility] LCSW Group Therapy Note   Group Date: 10/11/2021 Start Time: 1430 End Time: 1530   Type of Therapy/Topic:  Group Therapy:  Emotion Regulation  Participation Level:  Did Not Attend    Description of Group:    The purpose of this group is to assist patients in learning to regulate negative emotions and experience positive emotions. Patients will be guided to discuss ways in which they have been vulnerable to their negative emotions. These vulnerabilities will be juxtaposed with experiences of positive emotions or situations, and patients challenged to use positive emotions to combat negative ones. Special emphasis will be placed on coping with negative emotions in conflict situations, and patients will process healthy conflict resolution skills.  Therapeutic Goals: Patient will identify two positive emotions or experiences to reflect on in order to balance out negative emotions:  Patient will label two or more emotions that they find the most difficult to experience:  Patient will be able to demonstrate positive conflict resolution skills through discussion or role plays:   Summary of Patient Progress: X   Therapeutic Modalities:   Cognitive Behavioral Therapy Feelings Identification Dialectical Behavioral Therapy   Shirl Harris, LCSW

## 2021-10-11 NOTE — Progress Notes (Signed)
Patient is irritable at this encounter. She is med compliant and received her meds without incident. She is active on the unit and sits in the dayroom watching tv with her peers.  She denies si/hi/avh. Endorses depression and anxiety.  Waiting and hoping on getting inpatient SA treatment has her a little upset. Support and encouragement and support offered.  Will continue to monitor with q15 min safety checks.      C Butler-Nicholson, LPN

## 2021-10-12 DIAGNOSIS — F332 Major depressive disorder, recurrent severe without psychotic features: Secondary | ICD-10-CM | POA: Diagnosis not present

## 2021-10-12 LAB — GLUCOSE, CAPILLARY
Glucose-Capillary: 218 mg/dL — ABNORMAL HIGH (ref 70–99)
Glucose-Capillary: 214 mg/dL — ABNORMAL HIGH (ref 70–99)
Glucose-Capillary: 218 mg/dL — ABNORMAL HIGH (ref 70–99)
Glucose-Capillary: 293 mg/dL — ABNORMAL HIGH (ref 70–99)

## 2021-10-12 MED ORDER — BENZOCAINE 10 % MT GEL
Freq: Four times a day (QID) | OROMUCOSAL | Status: DC | PRN
Start: 2021-10-12 — End: 2021-10-14
  Administered 2021-10-13 (×2): 1 via OROMUCOSAL
  Filled 2021-10-12: qty 9

## 2021-10-12 MED ORDER — AMLODIPINE BESYLATE 5 MG PO TABS
10.0000 mg | ORAL_TABLET | Freq: Every day | ORAL | Status: DC
  Administered 2021-10-13 – 2021-10-14 (×2): 10 mg via ORAL
  Filled 2021-10-12 (×2): qty 2

## 2021-10-12 NOTE — Plan of Care (Signed)
Pt is cooperative and compliant with medications.  Pt c/o migraine and received PRN with some relief.  PT c/o nicotine cravings and opted for nicotine gum instead of patch.  PT said to this writer "I think I'm going to ask to go home today."  Pt received routine glucometer checks and insulin as ordered.  Pt's AC BG consistently between 200-220.  Safety maintained through q 15 min monitoring.

## 2021-10-12 NOTE — Progress Notes (Signed)
[facility] MD Progress Note  10/12/2021 1:45 PM Julie Lambert  MRN:  [id number] Subjective: Julie Lambert is seen on rounds.  She complains of a headache and states that her blood pressure has been high.  We talked about going up on her Norvasc.  Other than that, she is doing fine.  No other issues.  Principal Problem: Major depressive disorder, recurrent severe without psychotic features ([place]) Diagnosis: Principal Problem:   Major depressive disorder, recurrent severe without psychotic features ([place]) Active Problems:   Diabetes mellitus without complication (HCC)   Severe alcohol use disorder (HCC)   Cocaine use disorder, moderate, dependence ([place])  Total Time spent with patient: 15 minutes  Past Psychiatric History: Past history of chronic depression and anxiety and substance use problems as well as medical issues  Past Medical History:  Past Medical History:  Diagnosis Date   Bipolar 2 disorder ([place])    Diabetes mellitus without complication ([place])    Diverticulitis    Diverticulosis    GERD (gastroesophageal reflux disease)    Sciatica    Substance abuse ([place])    Uterine fibroid     Past Surgical History:  Procedure Laterality Date   BACK SURGERY     CHOLECYSTECTOMY     LUMBAR SPINE SURGERY     Family History:  Family History  Problem Relation Age of Onset   CAD Mother    Diabetes Mother    Heart disease Mother    Parkinson's disease Mother    Kidney disease Mother    Cancer Father        prostate cancer   Breast cancer Maternal Grandmother 62   Breast cancer Cousin 10   Breast cancer Cousin 64   Breast cancer Cousin 4   Breast cancer Cousin 43    Social History:  Social History   Substance and Sexual Activity  Alcohol Use Yes   Comment: once per week, 6 beers     Social History   Substance and Sexual Activity  Drug Use Not Currently   Types: "Crack" cocaine   Comment: last use 7/22, smoked crack    Social History   Socioeconomic History   Marital  status: Divorced    Spouse name: Not on file   Number of children: Not on file   Years of education: Not on file   Highest education level: Not on file  Occupational History   Not on file  Tobacco Use   Smoking status: Every Day    Packs/day: 0.25    Years: 40.00    Total pack years: 10.00    Types: Cigarettes   Smokeless tobacco: Never  Vaping Use   Vaping Use: Never used  Substance and Sexual Activity   Alcohol use: Yes    Comment: once per week, 6 beers   Drug use: Not Currently    Types: "Crack" cocaine    Comment: last use 7/22, smoked crack   Sexual activity: Yes    Partners: Male    Birth control/protection: Condom, Post-menopausal  Other Topics Concern   Not on file  Social History Narrative   Not on file   Social Determinants of Health   Financial Resource Strain: Not on file  Food Insecurity: Not on file  Transportation Needs: Not on file  Physical Activity: Not on file  Stress: Not on file  Social Connections: Not on file   Additional Social History:  Sleep: Good  Appetite:  Good  Current Medications: Current Facility-Administered Medications  Medication Dose Route Frequency Provider Last Rate Last Admin   acetaminophen (TYLENOL) tablet 650 mg  650 mg Oral Q6H PRN Patrecia Pour, NP   650 mg at 10/12/21 0808   alum & mag hydroxide-simeth (MAALOX/MYLANTA) 200-200-20 MG/5ML suspension 30 mL  30 mL Oral Q4H PRN Patrecia Pour, NP       Derrill Memo ON 10/13/2021] amLODipine (NORVASC) tablet 10 mg  10 mg Oral Daily Parks Ranger, DO       ARIPiprazole (ABILIFY) tablet 5 mg  5 mg Oral Daily Clapacs, [name], MD   5 mg at 10/12/21 9983   cyclobenzaprine (FLEXERIL) tablet 5 mg  5 mg Oral TID PRN Clapacs, [name], MD   5 mg at 10/11/21 1649   gabapentin (NEURONTIN) capsule 600 mg  600 mg Oral TID Clapacs, [name], MD   600 mg at 10/12/21 1256   hydrALAZINE (APRESOLINE) tablet 25 mg  25 mg Oral Q8H PRN Clapacs, [name], MD   25  mg at 10/11/21 1835   hydrochlorothiazide (HYDRODIURIL) tablet 25 mg  25 mg Oral Daily Clapacs, [name], MD   25 mg at 10/12/21 0610   hydrOXYzine (ATARAX) tablet 50 mg  50 mg Oral Q4H PRN Clapacs, [name], MD   50 mg at 10/12/21 1305   ibuprofen (ADVIL) tablet 600 mg  600 mg Oral Q6H PRN Clapacs, [name], MD   600 mg at 10/12/21 1305   insulin aspart (novoLOG) injection 0-15 Units  0-15 Units Subcutaneous TID WC Patrecia Pour, NP   5 Units at 10/12/21 1227   insulin glargine-yfgn (SEMGLEE) injection 10 Units  10 Units Subcutaneous q AM Patrecia Pour, NP   10 Units at 10/12/21 1109   insulin glargine-yfgn (SEMGLEE) injection 30 Units  30 Units Subcutaneous QHS Patrecia Pour, NP   30 Units at 10/10/21 2219   magnesium hydroxide (MILK OF MAGNESIA) suspension 30 mL  30 mL Oral Daily PRN Patrecia Pour, NP   30 mL at 10/10/21 0745   nicotine (NICODERM CQ - dosed in mg/24 hours) patch 21 mg  21 mg Transdermal Daily Patrecia Pour, NP   21 mg at 10/08/21 3825   nicotine polacrilex (NICORETTE) gum 2 mg  2 mg Oral PRN Clapacs, [name], MD   2 mg at 10/12/21 1305   simethicone (MYLICON) chewable tablet 160 mg  160 mg Oral QID PRN Clapacs, [name], MD   160 mg at 10/12/21 0803   SUMAtriptan (IMITREX) tablet 100 mg  100 mg Oral Q2H PRN Clapacs, [name], MD   100 mg at 10/12/21 0539   traZODone (DESYREL) tablet 150 mg  150 mg Oral QHS Clapacs, [name], MD   150 mg at 10/11/21 2113    Lab Results:  Results for orders placed or performed during the hospital encounter of 10/05/21 (from the past 48 hour(s))  Glucose, capillary     Status: Abnormal   Collection Time: 10/10/21  4:18 PM  Result Value Ref Range   Glucose-Capillary 183 (H) 70 - 99 mg/dL    Comment: Glucose reference range applies only to samples taken after fasting for at least 8 hours.  Glucose, capillary     Status: Abnormal   Collection Time: 10/10/21  9:51 PM  Result Value Ref Range   Glucose-Capillary 205 (H) 70 - 99 mg/dL    Comment:  Glucose reference range applies  only to samples taken after fasting for at least 8 hours.  Glucose, capillary     Status: Abnormal   Collection Time: 10/11/21  6:34 AM  Result Value Ref Range   Glucose-Capillary 176 (H) 70 - 99 mg/dL    Comment: Glucose reference range applies only to samples taken after fasting for at least 8 hours.  Glucose, capillary     Status: Abnormal   Collection Time: 10/11/21 11:50 AM  Result Value Ref Range   Glucose-Capillary 155 (H) 70 - 99 mg/dL    Comment: Glucose reference range applies only to samples taken after fasting for at least 8 hours.  Glucose, capillary     Status: Abnormal   Collection Time: 10/11/21  4:44 PM  Result Value Ref Range   Glucose-Capillary 229 (H) 70 - 99 mg/dL    Comment: Glucose reference range applies only to samples taken after fasting for at least 8 hours.  Glucose, capillary     Status: Abnormal   Collection Time: 10/11/21  7:54 PM  Result Value Ref Range   Glucose-Capillary 256 (H) 70 - 99 mg/dL    Comment: Glucose reference range applies only to samples taken after fasting for at least 8 hours.   Comment 1 Notify RN   Glucose, capillary     Status: Abnormal   Collection Time: 10/12/21  6:37 AM  Result Value Ref Range   Glucose-Capillary 218 (H) 70 - 99 mg/dL    Comment: Glucose reference range applies only to samples taken after fasting for at least 8 hours.  Glucose, capillary     Status: Abnormal   Collection Time: 10/12/21 11:28 AM  Result Value Ref Range   Glucose-Capillary 214 (H) 70 - 99 mg/dL    Comment: Glucose reference range applies only to samples taken after fasting for at least 8 hours.   Comment 1 Notify RN     Blood Alcohol level:  Lab Results  Component Value Date   ETH 56 (H) 10/04/2021   ETH 13 (H) 09/62/8366    Metabolic Disorder Labs: Lab Results  Component Value Date   HGBA1C 11.4 (H) 03/18/2021   MPG 280.48 03/18/2021   MPG 240.3 03/17/2019   No results found for: "PROLACTIN" Lab  Results  Component Value Date   CHOL 253 (H) 03/19/2019   TRIG 256 (H) 03/19/2019   HDL 72 03/19/2019   CHOLHDL 3.5 03/19/2019   VLDL 51 (H) 03/19/2019   LDLCALC 130 (H) 03/19/2019   LDLCALC 39 03/19/2018    Physical Findings: AIMS:  , ,  ,  ,    CIWA:    COWS:     Musculoskeletal: Strength & Muscle Tone: within normal limits Gait & Station: normal Patient leans: N/A  Psychiatric Specialty Exam:  Presentation  General Appearance: Appropriate for Environment  Eye Contact:Fair  Speech:Clear and Coherent  Speech Volume:Decreased  Handedness:Right   Mood and Affect  Mood:Depressed; Hopeless; Worthless  Affect:Congruent; Depressed; Tearful   Thought Process  Thought Processes:Coherent  Descriptions of Associations:Intact  Orientation:Full (Time, Place and Person)  Thought Content:Logical  History of Schizophrenia/Schizoaffective disorder:No  Duration of Psychotic Symptoms:No data recorded Hallucinations:No data recorded Ideas of Reference:None  Suicidal Thoughts:No data recorded Homicidal Thoughts:No data recorded  Sensorium  Memory:Immediate Fair  Judgment:Poor  Insight:Poor   Executive Functions  Concentration:Fair  Attention Span:Fair  [place]   Psychomotor Activity  Psychomotor Activity:No data recorded  Assets  Assets:Communication Skills; Desire for Improvement; Housing; Financial Resources/Insurance  Sleep  Sleep:No data recorded    Blood pressure (!) 144/88, pulse 86, temperature 98.2 F (36.8 C), temperature source Oral, resp. rate 18, height '5\' 6"'$  (1.676 m), weight 81.7 kg, last menstrual period 07/22/2016, SpO2 100 %. Body mass index is 29.09 kg/m.   Treatment Plan Summary: Daily contact with patient to assess and evaluate symptoms and progress in treatment, Medication management, and Plan increase Norvasc to 10 mg/day.  [place], DO 10/12/2021, 1:45 PM

## 2021-10-13 DIAGNOSIS — F332 Major depressive disorder, recurrent severe without psychotic features: Secondary | ICD-10-CM | POA: Diagnosis not present

## 2021-10-13 LAB — GLUCOSE, CAPILLARY
Glucose-Capillary: 227 mg/dL — ABNORMAL HIGH (ref 70–99)
Glucose-Capillary: 248 mg/dL — ABNORMAL HIGH (ref 70–99)
Glucose-Capillary: 288 mg/dL — ABNORMAL HIGH (ref 70–99)
Glucose-Capillary: 282 mg/dL — ABNORMAL HIGH (ref 70–99)

## 2021-10-13 NOTE — BHH Group Notes (Signed)
[facility] Group Notes:  (Nursing/MHT/Case Management/Adjunct)  Date:  10/13/2021  Time:  10:09 AM  Type of Therapy:   community meeting  Participation Level:  Did Not Attend    Antonieta Pert 10/13/2021, 10:09 AM

## 2021-10-13 NOTE — BHH Counselor (Signed)
CSW met with pt briefly, per request. She stated that she has spoken with Lexine Baton from [facility] and that they should be able to give her a decision on Tuesday, 10/15/21. CSW thanked pt for this update. Pt appeared excited about getting this news. No other concerns expressed. Contact ended without incident.   Chalmers Guest. Guerry Bruin, MSW, [place], [place] 10/13/2021 10:55 AM

## 2021-10-13 NOTE — BH IP Treatment Plan (Signed)
Interdisciplinary Treatment and Diagnostic Plan Update  10/13/2021 Time of Session: 8:30AM Julie Lambert MRN: [id number]  Principal Diagnosis: Major depressive disorder, recurrent severe without psychotic features ([place])  Secondary Diagnoses: Principal Problem:   Major depressive disorder, recurrent severe without psychotic features ([place]) Active Problems:   Diabetes mellitus without complication ([place])   Severe alcohol use disorder ([place])   Cocaine use disorder, moderate, dependence ([place])   Current Medications:  Current Facility-Administered Medications  Medication Dose Route Frequency Provider Last Rate Last Admin   acetaminophen (TYLENOL) tablet 650 mg  650 mg Oral Q6H PRN Patrecia Pour, NP   650 mg at 10/13/21 0817   alum & mag hydroxide-simeth (MAALOX/MYLANTA) 200-200-20 MG/5ML suspension 30 mL  30 mL Oral Q4H PRN Patrecia Pour, NP       amLODipine (NORVASC) tablet 10 mg  10 mg Oral Daily Parks Ranger, DO   10 mg at 10/13/21 0817   ARIPiprazole (ABILIFY) tablet 5 mg  5 mg Oral Daily Clapacs, [name], MD   5 mg at 10/13/21 0817   benzocaine (ORAJEL) 10 % mucosal gel   Mouth/Throat QID PRN Deloria Lair, NP   1 Application at 07/62/26 0818   cyclobenzaprine (FLEXERIL) tablet 5 mg  5 mg Oral TID PRN Clapacs, [name], MD   5 mg at 10/12/21 2118   gabapentin (NEURONTIN) capsule 600 mg  600 mg Oral TID Clapacs, [name], MD   600 mg at 10/13/21 0819   hydrALAZINE (APRESOLINE) tablet 25 mg  25 mg Oral Q8H PRN Clapacs, [name], MD   25 mg at 10/11/21 1835   hydrochlorothiazide (HYDRODIURIL) tablet 25 mg  25 mg Oral Daily Clapacs, [name], MD   25 mg at 10/13/21 0817   hydrOXYzine (ATARAX) tablet 50 mg  50 mg Oral Q4H PRN Clapacs, [name], MD   50 mg at 10/12/21 1742   ibuprofen (ADVIL) tablet 600 mg  600 mg Oral Q6H PRN Clapacs, [name], MD   600 mg at 10/12/21 1305   insulin aspart (novoLOG) injection 0-15 Units  0-15 Units Subcutaneous TID WC Patrecia Pour, NP   5 Units at  10/13/21 0752   insulin glargine-yfgn (SEMGLEE) injection 10 Units  10 Units Subcutaneous q AM Patrecia Pour, NP   10 Units at 10/12/21 1109   insulin glargine-yfgn (SEMGLEE) injection 30 Units  30 Units Subcutaneous QHS Patrecia Pour, NP   30 Units at 10/12/21 2100   magnesium hydroxide (MILK OF MAGNESIA) suspension 30 mL  30 mL Oral Daily PRN Patrecia Pour, NP   30 mL at 10/10/21 0745   nicotine (NICODERM CQ - dosed in mg/24 hours) patch 21 mg  21 mg Transdermal Daily Patrecia Pour, NP   21 mg at 10/08/21 3335   nicotine polacrilex (NICORETTE) gum 2 mg  2 mg Oral PRN Clapacs, [name], MD   2 mg at 10/13/21 0817   simethicone (MYLICON) chewable tablet 160 mg  160 mg Oral QID PRN Clapacs, [name], MD   160 mg at 10/12/21 2143   SUMAtriptan (IMITREX) tablet 100 mg  100 mg Oral Q2H PRN Clapacs, [name], MD   100 mg at 10/13/21 0817   traZODone (DESYREL) tablet 150 mg  150 mg Oral QHS Clapacs, [name], MD   150 mg at 10/12/21 2116   PTA Medications: Medications Prior to Admission  Medication Sig Dispense Refill Last Dose   albuterol (VENTOLIN HFA) 108 (90 Base) MCG/ACT  inhaler Inhale 2 puffs into the lungs once every 6 (six) hours as needed for wheezing or shortness of breath. 18 g 0    escitalopram (LEXAPRO) 20 MG tablet Take 20 mg by mouth daily.      gabapentin (NEURONTIN) 400 MG capsule Take 400 mg by mouth 2 (two) times daily.      GNP ATHLETES FOOT 1 % cream Apply 1 application topically 2 (two) times daily. (Patient not taking: Reported on 10/04/2021)      hydrOXYzine (VISTARIL) 25 MG capsule Take 25 mg by mouth 3 (three) times daily.      insulin glargine-yfgn (SEMGLEE) 100 UNIT/ML injection Inject 0.1 mLs (10 Units total) into the skin in the morning. 10 mL 11    insulin glargine-yfgn (SEMGLEE, YFGN,) 100 UNIT/ML Pen Inject 30 Units into the skin at bedtime. 3 mL 0    insulin lispro (HUMALOG KWIKPEN) 100 UNIT/ML KwikPen Inject 3 Units into the skin 3 (three) times daily with meals. 3 mL  0    Insulin Pen Needle (UNIFINE PENTIPS) 31G X 5 MM MISC Use to administer Humalog and Semglee insulin 40 each 0    lamoTRIgine (LAMICTAL) 100 MG tablet Take 1 tablet (100 mg total) by mouth daily. 30 tablet 1    mupirocin ointment (BACTROBAN) 2 % Apply 1 application topically 2 (two) times daily. (Patient not taking: Reported on 10/04/2021)      promethazine-dextromethorphan (PROMETHAZINE-DM) 6.25-15 MG/5ML syrup Take 5 mLs by mouth 4 (four) times daily as needed for cough. (Patient not taking: Reported on 10/04/2021) 118 mL 0    traZODone (DESYREL) 100 MG tablet Take 1 tablet (100 mg total) by mouth at bedtime. 10 tablet 0    triamcinolone cream (KENALOG) 0.1 % Apply 1 application topically 2 (two) times daily. (Patient not taking: Reported on 10/04/2021)       Patient Stressors: Financial difficulties   Health problems   Substance abuse   Traumatic event    Patient Strengths: Ability for insight  Active sense of humor  Communication skills  Motivation for treatment/growth   Treatment Modalities: Medication Management, Group therapy, Case management,  1 to 1 session with clinician, Psychoeducation, Recreational therapy.   Physician Treatment Plan for Primary Diagnosis: Major depressive disorder, recurrent severe without psychotic features ([place]) Long Term Goal(s): Improvement in symptoms so as ready for discharge   Short Term Goals: Compliance with prescribed medications will improve Ability to demonstrate self-control will improve Ability to identify and develop effective coping behaviors will improve  Medication Management: Evaluate patient's response, side effects, and tolerance of medication regimen.  Therapeutic Interventions: 1 to 1 sessions, Unit Group sessions and Medication administration.  Evaluation of Outcomes: Progressing  Physician Treatment Plan for Secondary Diagnosis: Principal Problem:   Major depressive disorder, recurrent severe without psychotic features  ([place]) Active Problems:   Diabetes mellitus without complication (H[facility])   Severe alcohol use disorder (H[facility])   Cocaine use disorder, moderate, dependence ([place])  Long Term Goal(s): Improvement in symptoms so as ready for discharge   Short Term Goals: Compliance with prescribed medications will improve Ability to demonstrate self-control will improve Ability to identify and develop effective coping behaviors will improve     Medication Management: Evaluate patient's response, side effects, and tolerance of medication regimen.  Therapeutic Interventions: 1 to 1 sessions, Unit Group sessions and Medication administration.  Evaluation of Outcomes: Progressing   RN Treatment Plan for Primary Diagnosis: Major depressive disorder, recurrent severe without psychotic features ([place]) Long Term Goal(s): Knowledge  of disease and therapeutic regimen to maintain health will improve  Short Term Goals: Ability to remain free from injury will improve, Ability to verbalize frustration and anger appropriately will improve, Ability to demonstrate self-control, Ability to participate in decision making will improve, Ability to verbalize feelings will improve, Ability to disclose and discuss suicidal ideas, Ability to identify and develop effective coping behaviors will improve, and Compliance with prescribed medications will improve  Medication Management: RN will administer medications as ordered by provider, will assess and evaluate patient's response and provide education to patient for prescribed medication. RN will report any adverse and/or side effects to prescribing provider.  Therapeutic Interventions: 1 on 1 counseling sessions, Psychoeducation, Medication administration, Evaluate responses to treatment, Monitor vital signs and CBGs as ordered, Perform/monitor CIWA, COWS, AIMS and Fall Risk screenings as ordered, Perform wound care treatments as ordered.  Evaluation of Outcomes: Progressing   LCSW  Treatment Plan for Primary Diagnosis: Major depressive disorder, recurrent severe without psychotic features ([place]) Long Term Goal(s): Safe transition to appropriate next level of care at discharge, Engage patient in therapeutic group addressing interpersonal concerns.  Short Term Goals: Engage patient in aftercare planning with referrals and resources, Increase social support, Increase ability to appropriately verbalize feelings, Increase emotional regulation, Facilitate acceptance of mental health diagnosis and concerns, Facilitate patient progression through stages of change regarding substance use diagnoses and concerns, Identify triggers associated with mental health/substance abuse issues, and Increase skills for wellness and recovery  Therapeutic Interventions: Assess for all discharge needs, 1 to 1 time with Social worker, Explore available resources and support systems, Assess for adequacy in community support network, Educate family and significant other(s) on suicide prevention, Complete Psychosocial Assessment, Interpersonal group therapy.  Evaluation of Outcomes: Progressing   Progress in Treatment: Attending groups: No. Participating in groups: No. Taking medication as prescribed: Yes. Toleration medication: Yes. Family/Significant other contact made: Yes, individual(s) contacted:  son, Gillermo Murdoch. Patient understands diagnosis: Yes. Discussing patient identified problems/goals with staff: Yes. Medical problems stabilized or resolved: Yes. Denies suicidal/homicidal ideation: Yes. Issues/concerns per patient self-inventory: No. Other: none.  New problem(s) identified: No, Describe:  none identified. Update 10/13/21: No changes at this time.   New Short Term/Long Term Goal(s): detox, elimination of symptoms of psychosis, medication management for mood stabilization; elimination of SI thoughts; development of comprehensive mental wellness/sobriety plan. Update 10/13/21: No  changes at this time.   Patient Goals:  "I'm gonna need some stuff for a little withdrawal. I'm hoping to be able to get into some type of rehab facility." Update 10/13/21: No changes at this time.   Discharge Plan or Barriers: CSW will assist pt with development of an appropriate aftercare/discharge plan. Update 10/13/21: Pt has turned down two options for residential treatment and continues to wait to hear back from [facility]. Per pt, she stated that they should have a decision at the beginning of next week (Tuesday, 10/15/21, due to holiday).    Reason for Continuation of Hospitalization: Depression Medication stabilization Suicidal ideation Withdrawal symptoms   Estimated Length of Stay: 1-7 days Update 10/13/21: TBD  Last 3 [place] Suicide Severity Risk Score: Flowsheet Row Admission (Current) from 10/05/2021 in [facility] from 10/04/2021 in [facility] from 07/11/2021 in [facility] [place] No Risk       Last PHQ 2/9 Scores:     No data to display  Scribe for Treatment Team: Shirl Harris, Marlinda Mike 10/13/2021 8:47 AM

## 2021-10-13 NOTE — Group Note (Signed)
LCSW Group Therapy Note  Group Date: 10/13/2021 Start Time: 1300 End Time: 1400   Type of Therapy and Topic:  Group Therapy - How To Cope with Nervousness about Discharge   Participation Level:  Did Not Attend   Description of Group This process group involved identification of patients' feelings about discharge. Some of them are scheduled to be discharged soon, while others are new admissions, but each of them was asked to share thoughts and feelings surrounding discharge from the hospital. One common theme was that they are excited at the prospect of going home, while another was that many of them are apprehensive about sharing why they were hospitalized. Patients were given the opportunity to discuss these feelings with their peers in preparation for discharge.  Therapeutic Goals  Patient will identify their overall feelings about pending discharge. Patient will think about how they might proactively address issues that they believe will once again arise once they get home (i.e. with parents). Patients will participate in discussion about having hope for change.   Summary of Patient Progress:   X   Therapeutic Modalities Cognitive Behavioral Therapy   Shirl Harris, LCSW 10/13/2021  1:37 PM

## 2021-10-13 NOTE — Plan of Care (Signed)
Pt is anxious and complaining of pain in her neck, head, legs and a toothache.  Pt requested imitrex and tylenol but this was not effective.  Pt received cyclobenzaprine and Ibuprofen.  Pt reported fair sleep with sleep medication and a good appetite.  Pt describes energy level as normal and concentration as high.  Pt rates depression 6/10, hopelessness 6/10 and anxiety as 5/10.  Pt denies suicidal ideation.  Pt's goal is to work on discharge.  Pt complaining of tooth pain, head pain.  Pt will talk to MD tomorrow to determine if further treatment is advised.  Safety maintained; 15 minute checks continue.

## 2021-10-13 NOTE — Progress Notes (Signed)
[facility] MD Progress Note  10/13/2021 4:19 PM Nathalie L. Edberg  MRN:  [id number] Subjective: Julie Lambert is seen on rounds.  She is complaining of a headache.  Her blood pressure has been high so I went up on her Norvasc yesterday.  She has Advil ordered.  Other than that, she is doing pretty well.  Principal Problem: Major depressive disorder, recurrent severe without psychotic features ([place]) Diagnosis: Principal Problem:   Major depressive disorder, recurrent severe without psychotic features ([place]) Active Problems:   Diabetes mellitus without complication (HCC)   Severe alcohol use disorder (HCC)   Cocaine use disorder, moderate, dependence ([place])  Total Time spent with patient: 15 minutes  Past Psychiatric History:  Past history of chronic depression and anxiety and substance use problems as well as medical issues  Past Medical History:  Past Medical History:  Diagnosis Date   Bipolar 2 disorder ([place])    Diabetes mellitus without complication ([place])    Diverticulitis    Diverticulosis    GERD (gastroesophageal reflux disease)    Sciatica    Substance abuse ([place])    Uterine fibroid     Past Surgical History:  Procedure Laterality Date   BACK SURGERY     CHOLECYSTECTOMY     LUMBAR SPINE SURGERY     Family History:  Family History  Problem Relation Age of Onset   CAD Mother    Diabetes Mother    Heart disease Mother    Parkinson's disease Mother    Kidney disease Mother    Cancer Father        prostate cancer   Breast cancer Maternal Grandmother 82   Breast cancer Cousin 7   Breast cancer Cousin 31   Breast cancer Cousin 62   Breast cancer Cousin 4    Social History:  Social History   Substance and Sexual Activity  Alcohol Use Yes   Comment: once per week, 6 beers     Social History   Substance and Sexual Activity  Drug Use Not Currently   Types: "Crack" cocaine   Comment: last use 7/22, smoked crack    Social History   Socioeconomic History   Marital  status: Divorced    Spouse name: Not on file   Number of children: Not on file   Years of education: Not on file   Highest education level: Not on file  Occupational History   Not on file  Tobacco Use   Smoking status: Every Day    Packs/day: 0.25    Years: 40.00    Total pack years: 10.00    Types: Cigarettes   Smokeless tobacco: Never  Vaping Use   Vaping Use: Never used  Substance and Sexual Activity   Alcohol use: Yes    Comment: once per week, 6 beers   Drug use: Not Currently    Types: "Crack" cocaine    Comment: last use 7/22, smoked crack   Sexual activity: Yes    Partners: Male    Birth control/protection: Condom, Post-menopausal  Other Topics Concern   Not on file  Social History Narrative   Not on file   Social Determinants of Health   Financial Resource Strain: Not on file  Food Insecurity: Not on file  Transportation Needs: Not on file  Physical Activity: Not on file  Stress: Not on file  Social Connections: Not on file   Additional Social History:  Sleep: Good  Appetite:  Good  Current Medications: Current Facility-Administered Medications  Medication Dose Route Frequency Provider Last Rate Last Admin   acetaminophen (TYLENOL) tablet 650 mg  650 mg Oral Q6H PRN Patrecia Pour, NP   650 mg at 10/13/21 0817   alum & mag hydroxide-simeth (MAALOX/MYLANTA) 200-200-20 MG/5ML suspension 30 mL  30 mL Oral Q4H PRN Patrecia Pour, NP       amLODipine (NORVASC) tablet 10 mg  10 mg Oral Daily Parks Ranger, DO   10 mg at 10/13/21 3570   ARIPiprazole (ABILIFY) tablet 5 mg  5 mg Oral Daily Clapacs, [name], MD   5 mg at 10/13/21 0817   benzocaine (ORAJEL) 10 % mucosal gel   Mouth/Throat QID PRN Deloria Lair, NP   1 Application at 17/79/39 0818   cyclobenzaprine (FLEXERIL) tablet 5 mg  5 mg Oral TID PRN Clapacs, [name], MD   5 mg at 10/13/21 1056   gabapentin (NEURONTIN) capsule 600 mg  600 mg Oral TID Clapacs, [name], MD   600 mg at 10/13/21 1143   hydrALAZINE (APRESOLINE) tablet 25 mg  25 mg Oral Q8H PRN Clapacs, [name], MD   25 mg at 10/11/21 1835   hydrochlorothiazide (HYDRODIURIL) tablet 25 mg  25 mg Oral Daily Clapacs, [name], MD   25 mg at 10/13/21 0817   hydrOXYzine (ATARAX) tablet 50 mg  50 mg Oral Q4H PRN Clapacs, [name], MD   50 mg at 10/12/21 1742   ibuprofen (ADVIL) tablet 600 mg  600 mg Oral Q6H PRN Clapacs, [name], MD   600 mg at 10/13/21 1056   insulin aspart (novoLOG) injection 0-15 Units  0-15 Units Subcutaneous TID WC Patrecia Pour, NP   5 Units at 10/13/21 1142   insulin glargine-yfgn (SEMGLEE) injection 10 Units  10 Units Subcutaneous q AM Patrecia Pour, NP   10 Units at 10/13/21 1056   insulin glargine-yfgn (SEMGLEE) injection 30 Units  30 Units Subcutaneous QHS Patrecia Pour, NP   30 Units at 10/12/21 2100   magnesium hydroxide (MILK OF MAGNESIA) suspension 30 mL  30 mL Oral Daily PRN Patrecia Pour, NP   30 mL at 10/10/21 0745   nicotine (NICODERM CQ - dosed in mg/24 hours) patch 21 mg  21 mg Transdermal Daily Patrecia Pour, NP   21 mg at 10/08/21 0300   nicotine polacrilex (NICORETTE) gum 2 mg  2 mg Oral PRN Clapacs, [name], MD   2 mg at 10/13/21 1540   simethicone (MYLICON) chewable tablet 160 mg  160 mg Oral QID PRN Clapacs, [name], MD   160 mg at 10/12/21 2143   SUMAtriptan (IMITREX) tablet 100 mg  100 mg Oral Q2H PRN Clapacs, [name], MD   100 mg at 10/13/21 9233   traZODone (DESYREL) tablet 150 mg  150 mg Oral QHS Clapacs, [name], MD   150 mg at 10/12/21 2116    Lab Results:  Results for orders placed or performed during the hospital encounter of 10/05/21 (from the past 48 hour(s))  Glucose, capillary     Status: Abnormal   Collection Time: 10/11/21  4:44 PM  Result Value Ref Range   Glucose-Capillary 229 (H) 70 - 99 mg/dL    Comment: Glucose reference range applies only to samples taken after fasting for at least 8 hours.  Glucose, capillary     Status: Abnormal    Collection Time: 10/11/21  7:54 PM  Result Value Ref Range   Glucose-Capillary 256 (H) 70 - 99 mg/dL    Comment: Glucose reference range applies only to samples taken after fasting for at least 8 hours.   Comment 1 Notify RN   Glucose, capillary     Status: Abnormal   Collection Time: 10/12/21  6:37 AM  Result Value Ref Range   Glucose-Capillary 218 (H) 70 - 99 mg/dL    Comment: Glucose reference range applies only to samples taken after fasting for at least 8 hours.  Glucose, capillary     Status: Abnormal   Collection Time: 10/12/21 11:28 AM  Result Value Ref Range   Glucose-Capillary 214 (H) 70 - 99 mg/dL    Comment: Glucose reference range applies only to samples taken after fasting for at least 8 hours.   Comment 1 Notify RN   Glucose, capillary     Status: Abnormal   Collection Time: 10/12/21  4:46 PM  Result Value Ref Range   Glucose-Capillary 218 (H) 70 - 99 mg/dL    Comment: Glucose reference range applies only to samples taken after fasting for at least 8 hours.  Glucose, capillary     Status: Abnormal   Collection Time: 10/12/21  9:12 PM  Result Value Ref Range   Glucose-Capillary 293 (H) 70 - 99 mg/dL    Comment: Glucose reference range applies only to samples taken after fasting for at least 8 hours.  Glucose, capillary     Status: Abnormal   Collection Time: 10/13/21  7:22 AM  Result Value Ref Range   Glucose-Capillary 227 (H) 70 - 99 mg/dL    Comment: Glucose reference range applies only to samples taken after fasting for at least 8 hours.  Glucose, capillary     Status: Abnormal   Collection Time: 10/13/21 11:05 AM  Result Value Ref Range   Glucose-Capillary 248 (H) 70 - 99 mg/dL    Comment: Glucose reference range applies only to samples taken after fasting for at least 8 hours.  Glucose, capillary     Status: Abnormal   Collection Time: 10/13/21  4:11 PM  Result Value Ref Range   Glucose-Capillary 288 (H) 70 - 99 mg/dL    Comment: Glucose reference range  applies only to samples taken after fasting for at least 8 hours.   Comment 1 Notify RN     Blood Alcohol level:  Lab Results  Component Value Date   ETH 56 (H) 10/04/2021   ETH 13 (H) 44/92/0100    Metabolic Disorder Labs: Lab Results  Component Value Date   HGBA1C 11.4 (H) 03/18/2021   MPG 280.48 03/18/2021   MPG 240.3 03/17/2019   No results found for: "PROLACTIN" Lab Results  Component Value Date   CHOL 253 (H) 03/19/2019   TRIG 256 (H) 03/19/2019   HDL 72 03/19/2019   CHOLHDL 3.5 03/19/2019   VLDL 51 (H) 03/19/2019   LDLCALC 130 (H) 03/19/2019   LDLCALC 39 03/19/2018    Physical Findings: AIMS:  , ,  ,  ,    CIWA:    COWS:     Musculoskeletal: Strength & Muscle Tone: within normal limits Gait & Station: normal Patient leans: N/A  Psychiatric Specialty Exam:  Presentation  General Appearance: Appropriate for Environment  Eye Contact:Fair  Speech:Clear and Coherent  Speech Volume:Decreased  Handedness:Right   Mood and Affect  Mood:Depressed; Hopeless; Worthless  Affect:Congruent; Depressed; Tearful   Thought Process  Thought Processes:Coherent  Descriptions of Associations:Intact  Orientation:Full (  Time, Place and Person)  Thought Content:Logical  History of Schizophrenia/Schizoaffective disorder:No  Duration of Psychotic Symptoms:No data recorded Hallucinations:No data recorded Ideas of Reference:None  Suicidal Thoughts:No data recorded Homicidal Thoughts:No data recorded  Sensorium  Memory:Immediate Fair  Judgment:Poor  Insight:Poor   Executive Functions  Concentration:Fair  Attention Span:Fair  [place]   Psychomotor Activity  Psychomotor Activity:No data recorded  Assets  Assets:Communication Skills; Desire for Improvement; Housing; Financial Resources/Insurance   Sleep  Sleep:No data recorded    Blood pressure 132/87, pulse 93, temperature 98.9 F (37.2 C),  temperature source Oral, resp. rate 18, height '5\' 6"'$  (1.676 m), weight 81.7 kg, last menstrual period 07/22/2016, SpO2 100 %. Body mass index is 29.09 kg/m.   Treatment Plan Summary: Daily contact with patient to assess and evaluate symptoms and progress in treatment, Medication management, and Plan continue current medications.  Parks Ranger, DO 10/13/2021, 4:19 PM

## 2021-10-14 ENCOUNTER — Other Ambulatory Visit: Payer: Self-pay

## 2021-10-14 DIAGNOSIS — F332 Major depressive disorder, recurrent severe without psychotic features: Secondary | ICD-10-CM | POA: Diagnosis not present

## 2021-10-14 LAB — GLUCOSE, CAPILLARY: Glucose-Capillary: 285 mg/dL — ABNORMAL HIGH (ref 70–99)

## 2021-10-14 MED ORDER — BENZOCAINE 10 % MT GEL
Freq: Four times a day (QID) | OROMUCOSAL | 0 refills | Status: AC | PRN
  Filled 2021-10-14: qty 5.3, fill #0

## 2021-10-14 MED ORDER — NICOTINE 21 MG/24HR TD PT24: 21.0000 mg | MEDICATED_PATCH | Freq: Every day | TRANSDERMAL | 0 refills | Status: DC

## 2021-10-14 MED ORDER — UNIFINE PENTIPS 31G X 5 MM MISC
0 refills | Status: AC
  Filled 2021-10-14: qty 30, 30d supply, fill #0

## 2021-10-14 MED ORDER — BENZOCAINE 10 % MT GEL: Freq: Four times a day (QID) | OROMUCOSAL | 1 refills | Status: DC | PRN

## 2021-10-14 MED ORDER — HYDROXYZINE HCL 50 MG PO TABS
50.0000 mg | ORAL_TABLET | ORAL | 0 refills | Status: AC | PRN
Start: 2021-10-14 — End: ?
  Filled 2021-10-14: qty 20, 4d supply, fill #0

## 2021-10-14 MED ORDER — HYDROCHLOROTHIAZIDE 25 MG PO TABS
25.0000 mg | ORAL_TABLET | Freq: Every day | ORAL | 0 refills | Status: AC
  Filled 2021-10-14: qty 10, 10d supply, fill #0

## 2021-10-14 MED ORDER — AMOXICILLIN 500 MG PO CAPS
500.0000 mg | ORAL_CAPSULE | Freq: Three times a day (TID) | ORAL | 0 refills | Status: AC
  Filled 2021-10-14: qty 30, 10d supply, fill #0

## 2021-10-14 MED ORDER — CYCLOBENZAPRINE HCL 5 MG PO TABS
5.0000 mg | ORAL_TABLET | Freq: Three times a day (TID) | ORAL | 0 refills | Status: AC | PRN
  Filled 2021-10-14: qty 20, 7d supply, fill #0

## 2021-10-14 MED ORDER — UNIFINE PENTIPS 31G X 5 MM MISC
1 refills | Status: DC
Start: 2021-10-14 — End: 2021-10-14

## 2021-10-14 MED ORDER — GABAPENTIN 300 MG PO CAPS
600.0000 mg | ORAL_CAPSULE | Freq: Three times a day (TID) | ORAL | 0 refills | Status: AC
  Filled 2021-10-14: qty 30, 5d supply, fill #0

## 2021-10-14 MED ORDER — CYCLOBENZAPRINE HCL 5 MG PO TABS: 5.0000 mg | ORAL_TABLET | Freq: Three times a day (TID) | ORAL | 1 refills | Status: DC | PRN

## 2021-10-14 MED ORDER — AMOXICILLIN 500 MG PO CAPS
500.0000 mg | ORAL_CAPSULE | Freq: Three times a day (TID) | ORAL | Status: DC
  Filled 2021-10-14: qty 1

## 2021-10-14 MED ORDER — HYDROCHLOROTHIAZIDE 25 MG PO TABS
25.0000 mg | ORAL_TABLET | Freq: Every day | ORAL | 1 refills | Status: DC
Start: 2021-10-15 — End: 2021-10-14

## 2021-10-14 MED ORDER — AMOXICILLIN 500 MG PO CAPS
500.0000 mg | ORAL_CAPSULE | Freq: Three times a day (TID) | ORAL | 0 refills | Status: DC
Start: 2021-10-14 — End: 2021-10-14

## 2021-10-14 MED ORDER — ARIPIPRAZOLE 5 MG PO TABS: 5.0000 mg | ORAL_TABLET | Freq: Every day | ORAL | 1 refills | Status: DC

## 2021-10-14 MED ORDER — ALBUTEROL SULFATE HFA 108 (90 BASE) MCG/ACT IN AERS
2.0000 | INHALATION_SPRAY | Freq: Four times a day (QID) | RESPIRATORY_TRACT | 0 refills | Status: AC | PRN
Start: 2021-10-14 — End: ?
  Filled 2021-10-14: qty 6.7, 30d supply, fill #0

## 2021-10-14 MED ORDER — GABAPENTIN 300 MG PO CAPS: 600.0000 mg | ORAL_CAPSULE | Freq: Three times a day (TID) | ORAL | 1 refills | Status: DC

## 2021-10-14 MED ORDER — HYDROXYZINE HCL 50 MG PO TABS: 50.0000 mg | ORAL_TABLET | ORAL | 1 refills | Status: DC | PRN

## 2021-10-14 MED ORDER — NICOTINE 21 MG/24HR TD PT24
21.0000 mg | MEDICATED_PATCH | Freq: Every day | TRANSDERMAL | 0 refills | Status: AC
  Filled 2021-10-14: qty 14, 14d supply, fill #0

## 2021-10-14 MED ORDER — AMLODIPINE BESYLATE 10 MG PO TABS
10.0000 mg | ORAL_TABLET | Freq: Every day | ORAL | 0 refills | Status: AC
  Filled 2021-10-14: qty 10, 10d supply, fill #0

## 2021-10-14 MED ORDER — INSULIN GLARGINE-YFGN 100 UNIT/ML ~~LOC~~ SOLN: 30.0000 [IU] | Freq: Every day | SUBCUTANEOUS | 1 refills | Status: DC

## 2021-10-14 MED ORDER — BASAGLAR KWIKPEN 100 UNIT/ML ~~LOC~~ SOPN
30.0000 [IU] | PEN_INJECTOR | Freq: Every day | SUBCUTANEOUS | 0 refills | Status: AC
  Filled 2021-10-14: qty 9, 30d supply, fill #0

## 2021-10-14 MED ORDER — TRAZODONE HCL 150 MG PO TABS: 150.0000 mg | ORAL_TABLET | Freq: Every day | ORAL | 1 refills | Status: DC

## 2021-10-14 MED ORDER — ALBUTEROL SULFATE HFA 108 (90 BASE) MCG/ACT IN AERS: 2.0000 | INHALATION_SPRAY | Freq: Four times a day (QID) | RESPIRATORY_TRACT | 1 refills | Status: DC | PRN

## 2021-10-14 MED ORDER — ARIPIPRAZOLE 5 MG PO TABS
5.0000 mg | ORAL_TABLET | Freq: Every day | ORAL | 0 refills | Status: AC
  Filled 2021-10-14: qty 10, 10d supply, fill #0

## 2021-10-14 MED ORDER — AMLODIPINE BESYLATE 10 MG PO TABS: 10.0000 mg | ORAL_TABLET | Freq: Every day | ORAL | 1 refills | Status: DC

## 2021-10-14 MED ORDER — INSULIN GLARGINE-YFGN 100 UNIT/ML ~~LOC~~ SOLN
10.0000 [IU] | Freq: Every morning | SUBCUTANEOUS | 1 refills | Status: DC
Start: 2021-10-15 — End: 2021-10-14

## 2021-10-14 MED ORDER — TRAZODONE HCL 150 MG PO TABS
150.0000 mg | ORAL_TABLET | Freq: Every day | ORAL | 0 refills | Status: AC
  Filled 2021-10-14: qty 10, 10d supply, fill #0

## 2021-10-14 MED ORDER — BASAGLAR KWIKPEN 100 UNIT/ML ~~LOC~~ SOPN
10.0000 [IU] | PEN_INJECTOR | Freq: Every morning | SUBCUTANEOUS | 0 refills | Status: AC
  Filled 2021-10-14: qty 3, 30d supply, fill #0

## 2021-10-14 NOTE — Discharge Summary (Signed)
Physician Discharge Summary Note  Patient:  Julie Lambert is an 56 y.o., female MRN:  [id number] DOB:  03-16-1965 Patient phone:  [phone] (home)  Patient address:   [address] [place] [address],  Total Time spent with patient: 30 minutes  Date of Admission:  10/05/2021 Date of Discharge: 10/14/2021  Reason for Admission: Patient was admitted because of depression with suicidal thoughts and substance abuse  Principal Problem: Major depressive disorder, recurrent severe without psychotic features ([place]) Discharge Diagnoses: Principal Problem:   Major depressive disorder, recurrent severe without psychotic features ([place]) Active Problems:   Diabetes mellitus without complication (HCC)   Severe alcohol use disorder (HCC)   Cocaine use disorder, moderate, dependence ([place])   Past Psychiatric History: History of substance use disorder or bipolar disorder chronic depression chronic mood instability  Past Medical History:  Past Medical History:  Diagnosis Date   Bipolar 2 disorder ([place])    Diabetes mellitus without complication ([place])    Diverticulitis    Diverticulosis    GERD (gastroesophageal reflux disease)    Sciatica    Substance abuse ([place])    Uterine fibroid     Past Surgical History:  Procedure Laterality Date   BACK SURGERY     CHOLECYSTECTOMY     LUMBAR SPINE SURGERY     Family History:  Family History  Problem Relation Age of Onset   CAD Mother    Diabetes Mother    Heart disease Mother    Parkinson's disease Mother    Kidney disease Mother    Cancer Father        prostate cancer   Breast cancer Maternal Grandmother 61   Breast cancer Cousin 90   Breast cancer Cousin 19   Breast cancer Cousin 67   Breast cancer Cousin 63   Family Psychiatric  History: See previous Social History:  Social History   Substance and Sexual Activity  Alcohol Use Yes   Comment: once per week, 6 beers     Social History   Substance and Sexual Activity  Drug  Use Not Currently   Types: "Crack" cocaine   Comment: last use 7/22, smoked crack    Social History   Socioeconomic History   Marital status: Divorced    Spouse name: Not on file   Number of children: Not on file   Years of education: Not on file   Highest education level: Not on file  Occupational History   Not on file  Tobacco Use   Smoking status: Every Day    Packs/day: 0.25    Years: 40.00    Total pack years: 10.00    Types: Cigarettes   Smokeless tobacco: Never  Vaping Use   Vaping Use: Never used  Substance and Sexual Activity   Alcohol use: Yes    Comment: once per week, 6 beers   Drug use: Not Currently    Types: "Crack" cocaine    Comment: last use 7/22, smoked crack   Sexual activity: Yes    Partners: Male    Birth control/protection: Condom, Post-menopausal  Other Topics Concern   Not on file  Social History Narrative   Not on file   Social Determinants of Health   Financial Resource Strain: Not on file  Food Insecurity: Not on file  Transportation Needs: Not on file  Physical Activity: Not on file  Stress: Not on file  Social Connections: Not on file    Hospital Course: Admitted to psychiatric unit.  15-minute checks continued.  Medical problems including diabetes were managed appropriately.  Hypertension managed appropriately.  Patient continued to express depression with passive suicidal ideation for many days.  Medicines were slightly adjusted and Abilify was started ultimately after she felt the lamotrigine was not helping.  Mood has improved significantly.  She denies all suicidal thoughts intent or plan and states she intends to follow up with outpatient rehab.  Referred to local mental health in the meantime.  Patient is denying suicidal thoughts does not appear to be psychotic and is requesting discharge  Physical Findings: AIMS:  , ,  ,  ,    CIWA:    COWS:     Musculoskeletal: Strength & Muscle Tone: within normal limits Gait & Station:  normal Patient leans: N/A   Psychiatric Specialty Exam:  Presentation  General Appearance: Appropriate for Environment  Eye Contact:Fair  Speech:Clear and Coherent  Speech Volume:Decreased  Handedness:Right   Mood and Affect  Mood:Depressed; Hopeless; Worthless  Affect:Congruent; Depressed; Tearful   Thought Process  Thought Processes:Coherent  Descriptions of Associations:Intact  Orientation:Full (Time, Place and Person)  Thought Content:Logical  History of Schizophrenia/Schizoaffective disorder:No  Duration of Psychotic Symptoms:No data recorded Hallucinations:No data recorded Ideas of Reference:None  Suicidal Thoughts:No data recorded Homicidal Thoughts:No data recorded  Sensorium  Memory:Immediate Fair  Judgment:Poor  Insight:Poor   Executive Functions  Concentration:Fair  Attention Span:Fair  [place]   Psychomotor Activity  Psychomotor Activity:No data recorded  Assets  Assets:Communication Skills; Desire for Improvement; Housing; Financial Resources/Insurance   Sleep  Sleep:No data recorded   Physical Exam: Physical Exam Vitals reviewed.  Constitutional:      Appearance: Normal appearance.  HENT:     Head: Normocephalic and atraumatic.     Mouth/Throat:     Pharynx: Oropharynx is clear.  Eyes:     Pupils: Pupils are equal, round, and reactive to light.  Cardiovascular:     Rate and Rhythm: Normal rate and regular rhythm.  Pulmonary:     Effort: Pulmonary effort is normal.     Breath sounds: Normal breath sounds.  Abdominal:     General: Abdomen is flat.     Palpations: Abdomen is soft.  Musculoskeletal:        General: Normal range of motion.  Skin:    General: Skin is warm and dry.  Neurological:     General: No focal deficit present.     Mental Status: She is alert. Mental status is at baseline.  Psychiatric:        Attention and Perception: Attention normal.         Mood and Affect: Mood normal.        Speech: Speech normal.        Behavior: Behavior normal.        Thought Content: Thought content normal.        Cognition and Memory: Cognition normal.    Review of Systems  Constitutional: Negative.   HENT: Negative.    Eyes: Negative.   Respiratory: Negative.    Cardiovascular: Negative.   Gastrointestinal: Negative.   Musculoskeletal: Negative.   Skin: Negative.   Neurological: Negative.   Psychiatric/Behavioral: Negative.     Blood pressure (!) 153/96, pulse (!) 102, temperature 98.2 F (36.8 C), temperature source Oral, resp. rate 18, height '5\' 6"'$  (1.676 m), weight 81.7 kg, last menstrual period 07/22/2016, SpO2 100 %. Body mass index is 29.09 kg/m.   Social History   Tobacco Use  Smoking Status Every Day   Packs/day: 0.25   Years: 40.00   Total pack years: 10.00   Types: Cigarettes  Smokeless Tobacco Never   Tobacco Cessation:  A prescription for an FDA-approved tobacco cessation medication provided at discharge   Blood Alcohol level:  Lab Results  Component Value Date   ETH 56 (H) 10/04/2021   ETH 13 (H) 08/10/1599    Metabolic Disorder Labs:  Lab Results  Component Value Date   HGBA1C 11.4 (H) 03/18/2021   MPG 280.48 03/18/2021   MPG 240.3 03/17/2019   No results found for: "PROLACTIN" Lab Results  Component Value Date   CHOL 253 (H) 03/19/2019   TRIG 256 (H) 03/19/2019   HDL 72 03/19/2019   CHOLHDL 3.5 03/19/2019   VLDL 51 (H) 03/19/2019   LDLCALC 130 (H) 03/19/2019   LDLCALC 39 03/19/2018    See Psychiatric Specialty Exam and Suicide Risk Assessment completed by Attending Physician prior to discharge.  Discharge destination:  Home  Is patient on multiple antipsychotic therapies at discharge:  No   Has Patient had three or more failed trials of antipsychotic monotherapy by history:  No  Recommended Plan for Multiple Antipsychotic Therapies: NA  Discharge Instructions     Diet - low sodium heart  healthy   Complete by: As directed    Increase activity slowly   Complete by: As directed       Allergies as of 10/14/2021   No Known Allergies      Medication List     STOP taking these medications    escitalopram 20 MG tablet Commonly known as: LEXAPRO   GNP Athletes Foot 1 % cream Generic drug: clotrimazole   hydrOXYzine 25 MG capsule Commonly known as: VISTARIL   insulin glargine-yfgn 100 UNIT/ML Pen Commonly known as: Semglee (yfgn) Replaced by: insulin glargine-yfgn 100 UNIT/ML injection You also have another medication with the same name that you need to continue taking as instructed.   insulin lispro 100 UNIT/ML KwikPen Commonly known as: HumaLOG KwikPen   lamoTRIgine 100 MG tablet Commonly known as: LAMICTAL   mupirocin ointment 2 % Commonly known as: BACTROBAN   promethazine-dextromethorphan 6.25-15 MG/5ML syrup Commonly known as: PROMETHAZINE-DM   triamcinolone cream 0.1 % Commonly known as: KENALOG       TAKE these medications      Indication  albuterol 108 (90 Base) MCG/ACT inhaler Commonly known as: Ventolin HFA Inhale 2 puffs into the lungs once every 6 (six) hours as needed for wheezing or shortness of breath.    amLODipine 10 MG tablet Commonly known as: NORVASC Take 1 tablet (10 mg total) by mouth daily. Start taking on: October 15, 2021  Indication: High Blood Pressure Disorder   amoxicillin 500 MG capsule Commonly known as: AMOXIL Take 1 capsule (500 mg total) by mouth every 8 (eight) hours.  Indication: Periodontitis   ARIPiprazole 5 MG tablet Commonly known as: ABILIFY Take 1 tablet (5 mg total) by mouth daily. Start taking on: October 15, 2021  Indication: Major Depressive Disorder   benzocaine 10 % mucosal gel Commonly known as: ORAJEL Use as directed in the mouth or throat 4 (four) times daily as needed for mouth pain.  Indication: Oral pain   cyclobenzaprine 5 MG tablet Commonly known as: FLEXERIL Take 1 tablet  (5 mg total) by mouth 3 (three) times daily as needed for muscle spasms (neck pain).  Indication: Muscle Spasm   gabapentin 300 MG capsule Commonly known as: NEURONTIN Take 2 capsules (  600 mg total) by mouth 3 (three) times daily. What changed:  medication strength how much to take when to take this  Indication: Social Anxiety Disorder   hydrochlorothiazide 25 MG tablet Commonly known as: HYDRODIURIL Take 1 tablet (25 mg total) by mouth daily. Start taking on: October 15, 2021  Indication: High Blood Pressure Disorder   hydrOXYzine 50 MG tablet Commonly known as: ATARAX Take 1 tablet (50 mg total) by mouth every 4 (four) hours as needed for anxiety or itching.  Indication: Feeling Anxious   insulin glargine-yfgn 100 UNIT/ML injection Commonly known as: SEMGLEE Inject 0.3 mLs (30 Units total) into the skin at bedtime. What changed: You were already taking a medication with the same name, and this prescription was added. Make sure you understand how and when to take each. Replaces: insulin glargine-yfgn 100 UNIT/ML Pen  Indication: Type 2 Diabetes   insulin glargine-yfgn 100 UNIT/ML injection Commonly known as: SEMGLEE Inject 0.1 mLs (10 Units total) into the skin in the morning. Start taking on: October 15, 2021 What changed:  Another medication with the same name was added. Make sure you understand how and when to take each. Another medication with the same name was removed. Continue taking this medication, and follow the directions you see here.  Indication: Type 2 Diabetes   nicotine 21 mg/24hr patch Commonly known as: NICODERM CQ - dosed in mg/24 hours Place 1 patch (21 mg total) onto the skin daily. Start taking on: October 15, 2021  Indication: Nicotine Addiction   traZODone 150 MG tablet Commonly known as: DESYREL Take 1 tablet (150 mg total) by mouth at bedtime. What changed:  medication strength how much to take  Indication: Trouble Sleeping   Unifine  Pentips 31G X 5 MM Misc Generic drug: Insulin Pen Needle Use to administer Humalog and Semglee insulin          Follow-up recommendations:  Other:  Continue current medicine including diabetes and hypertension medicine.  Follow-up with outpatient provider including dentist.  Follow up with substance abuse treatment  Comments: See above  Signed: Alethia Berthold, MD 10/14/2021, 10:18 AM

## 2021-10-14 NOTE — Progress Notes (Signed)
  [facility] Adult Case Management Discharge Plan :  Will you be returning to the same living situation after discharge:  Yes,  pt reports that she will be going to her sons home.  At discharge, do you have transportation home?: Yes,  pt reports that her son will provide transportation. Do you have the ability to pay for your medications: No.  Release of information consent forms completed and in the chart;  Patient's signature needed at discharge.  Patient to Follow up at:  Follow-up Information     [facility] Follow up.   Why: Walk in hours are from 8AM to 4PM on Monday, Wednesday and Friday. Contact information: [place] [address] [phone]                 Next level of care provider has access to [place] and Suicide Prevention discussed: Yes,  SPE completed with the patient and patients son.     Has patient been referred to the Quitline?: Patient refused referral  Patient has been referred for addiction treatment: Yes  Pt reports that she will follow up with the [facility] with [place].  Rozann Lesches, LCSW 10/14/2021, 11:05 AM

## 2021-10-14 NOTE — Progress Notes (Signed)
Pt presents with irritable mood, affect congruent. Lorey states she would like to be discharged today and feels ready to go. She states she is planning to go to [place] to follow up for addiction /substance use treatment. Pt denies any SI or HI. Pt main complaint has been her tooth which she was encouraged to discuss with MD to help address. Pt states ''I had the dentist take care of all this not too long ago but now it's back and it's killing me.'' Gum visualized in right back lower area and it is noted to be red,swollen. Md made aware. Pt compliant with am medications.  Orders received for discharge. AVS reviewed with patient at length including crisis services and follow up care. Patient verbalized understanding of follow up care and crisis resources. No signs of acute decompensation. All belongings returned and pt escorted from unit with staff.

## 2021-10-14 NOTE — Progress Notes (Signed)
No distress noted interacting appropriately with peers and staff, denies SI/HI/AVH, she c/o of tooth pain was given oral gel as needed and she expressed relief. 15 minutes safety checks maintained.

## 2021-10-14 NOTE — BHH Suicide Risk Assessment (Signed)
[facility] Discharge Suicide Risk Assessment   Principal Problem: Major depressive disorder, recurrent severe without psychotic features ([place]) Discharge Diagnoses: Principal Problem:   Major depressive disorder, recurrent severe without psychotic features ([place]) Active Problems:   Diabetes mellitus without complication (HCC)   Severe alcohol use disorder (HCC)   Cocaine use disorder, moderate, dependence ([place])   Total Time spent with patient: 30 minutes  Musculoskeletal: Strength & Muscle Tone: within normal limits Gait & Station: normal Patient leans: N/A  Psychiatric Specialty Exam  Presentation  General Appearance: Appropriate for Environment  Eye Contact:Fair  Speech:Clear and Coherent  Speech Volume:Decreased  Handedness:Right   Mood and Affect  Mood:Depressed; Hopeless; Worthless  Duration of Depression Symptoms: Greater than two weeks  Affect:Congruent; Depressed; Tearful   Thought Process  Thought Processes:Coherent  Descriptions of Associations:Intact  Orientation:Full (Time, Place and Person)  Thought Content:Logical  History of Schizophrenia/Schizoaffective disorder:No  Duration of Psychotic Symptoms:No data recorded Hallucinations:No data recorded Ideas of Reference:None  Suicidal Thoughts:No data recorded Homicidal Thoughts:No data recorded  Sensorium  Memory:Immediate Fair  Judgment:Poor  Insight:Poor   Executive Functions  Concentration:Fair  Attention Span:Fair  [place]   Psychomotor Activity  Psychomotor Activity:No data recorded  Assets  Assets:Communication Skills; Desire for Improvement; Housing; Financial Resources/Insurance   Sleep  Sleep:No data recorded  Physical Exam: Physical Exam Vitals and nursing note reviewed.  Constitutional:      Appearance: Normal appearance.  HENT:     Head: Normocephalic and atraumatic.     Mouth/Throat:     Pharynx: Oropharynx is  clear.  Eyes:     Pupils: Pupils are equal, round, and reactive to light.  Cardiovascular:     Rate and Rhythm: Normal rate and regular rhythm.  Pulmonary:     Effort: Pulmonary effort is normal.     Breath sounds: Normal breath sounds.  Abdominal:     General: Abdomen is flat.     Palpations: Abdomen is soft.  Musculoskeletal:        General: Normal range of motion.  Skin:    General: Skin is warm and dry.  Neurological:     General: No focal deficit present.     Mental Status: She is alert. Mental status is at baseline.  Psychiatric:        Attention and Perception: Attention normal.        Mood and Affect: Mood normal.        Speech: Speech normal.        Behavior: Behavior normal.        Thought Content: Thought content normal.        Cognition and Memory: Cognition normal.    Review of Systems  Constitutional: Negative.   HENT: Negative.    Eyes: Negative.   Respiratory: Negative.    Cardiovascular: Negative.   Gastrointestinal: Negative.   Musculoskeletal: Negative.   Skin: Negative.   Neurological: Negative.   Psychiatric/Behavioral: Negative.     Blood pressure (!) 153/96, pulse (!) 102, temperature 98.2 F (36.8 C), temperature source Oral, resp. rate 18, height '5\' 6"'$  (1.676 m), weight 81.7 kg, last menstrual period 07/22/2016, SpO2 100 %. Body mass index is 29.09 kg/m.  Mental Status Per Nursing Assessment::   On Admission:  NA  Demographic Factors:  Low socioeconomic status  Loss Factors: Decline in physical health  Historical Factors: Impulsivity  Risk Reduction Factors:   Sense of responsibility to family and Positive coping skills or problem solving skills  Continued  Clinical Symptoms:  Depression:   Comorbid alcohol abuse/dependence Alcohol/Substance Abuse/Dependencies  Cognitive Features That Contribute To Risk:  None    Suicide Risk:  Minimal: No identifiable suicidal ideation.  Patients presenting with no risk factors but with morbid  ruminations; may be classified as minimal risk based on the severity of the depressive symptoms    Plan Of Care/Follow-up recommendations:  Patient denies suicidal ideation.  Affect is upbeat.  Thoughts are clear and lucid.  Appropriate plan expressed for the future.  Agrees to outpatient treatment.  Alethia Berthold, MD 10/14/2021, 10:12 AM

## 2021-10-15 ENCOUNTER — Other Ambulatory Visit: Payer: Self-pay

## 2021-10-21 ENCOUNTER — Emergency Department
Admission: EM | Admit: 2021-10-21 | Discharge: 2021-10-21 | Disposition: A | Discharge status: Home / Self Care | Payer: Self-pay | Attending: Emergency Medicine | Admitting: Emergency Medicine

## 2021-10-21 ENCOUNTER — Other Ambulatory Visit: Payer: Self-pay

## 2021-10-21 ENCOUNTER — Encounter: Payer: Self-pay | Admitting: Emergency Medicine

## 2021-10-21 DIAGNOSIS — K029 Dental caries, unspecified: Secondary | ICD-10-CM | POA: Insufficient documentation

## 2021-10-21 DIAGNOSIS — E119 Type 2 diabetes mellitus without complications: Secondary | ICD-10-CM | POA: Insufficient documentation

## 2021-10-21 MED ORDER — IBUPROFEN 600 MG PO TABS
600.0000 mg | ORAL_TABLET | Freq: Once | ORAL | Status: AC
  Administered 2021-10-21: 600 mg via ORAL
  Filled 2021-10-21: qty 1

## 2021-10-21 MED ORDER — IBUPROFEN 800 MG PO TABS: 800.0000 mg | ORAL_TABLET | Freq: Three times a day (TID) | ORAL | 0 refills | Status: AC | PRN

## 2021-10-21 MED ORDER — CLINDAMYCIN HCL 300 MG PO CAPS: 300.0000 mg | ORAL_CAPSULE | Freq: Three times a day (TID) | ORAL | 0 refills | Status: AC

## 2021-10-21 MED ORDER — CEFTRIAXONE SODIUM 1 G IJ SOLR
1.0000 g | Freq: Once | INTRAMUSCULAR | Status: AC
  Administered 2021-10-21: 1 g via INTRAMUSCULAR
  Filled 2021-10-21: qty 10

## 2021-10-21 MED ORDER — LIDOCAINE HCL (PF) 1 % IJ SOLN
5.0000 mL | Freq: Once | INTRAMUSCULAR | Status: AC
  Administered 2021-10-21: 5 mL
  Filled 2021-10-21: qty 5

## 2021-10-21 NOTE — ED Notes (Signed)
See  triage note  Presents with cont's dental pain  states she had been on some meds  but not any better

## 2021-10-21 NOTE — ED Triage Notes (Signed)
Pt via POV from home. Pt here for L sided dental pain. States that she just finished her abx prescription this AM with no relief. Pt is A&OX4 and NAD.

## 2021-10-21 NOTE — Discharge Instructions (Signed)
Follow-up with the dentist at [facility] has arranged for you to see this week.  Begin taking clindamycin which is an antibiotic 3 times a day for the next 7 days and ibuprofen as needed for pain.

## 2021-10-21 NOTE — ED Provider Notes (Signed)
[facility] Provider Note    Event Date/Time   First MD Initiated Contact with Patient 10/21/21 1034     (approximate)   History   Dental Pain   HPI  Julie Lambert is a 56 y.o. female   presents to the ED with complaint of dental pain.  Patient states that she finished her antibiotics morning.  She states that while being an inpatient Dr. Weber Cooks wrote a prescription for amoxicillin 500 mg.  Patient reports she does not have a dentist.  She has a history of diabetes, GERD, polysubstance abuse, bipolar 2 disorder.      Physical Exam   Triage Vital Signs: ED Triage Vitals  Enc Vitals Group     BP 10/21/21 1006 132/88     Pulse Rate 10/21/21 1006 (!) 106     Resp 10/21/21 1006 20     Temp 10/21/21 1006 98.1 F (36.7 C)     Temp Source 10/21/21 1006 Oral     SpO2 10/21/21 1006 98 %     Weight 10/21/21 1003 180 lb (81.6 kg)     Height 10/21/21 1003 '5\' 7"'$  (1.702 m)     Head Circumference --      Peak Flow --      Pain Score 10/21/21 1003 10     Pain Loc --      Pain Edu? --      Excl. in [place]? --     Most recent vital signs: Vitals:   10/21/21 1006 10/21/21 1111  BP: 132/88 122/83  Pulse: (!) 106 91  Resp: 20 20  Temp: 98.1 F (36.7 C) 98.1 F (36.7 C)  SpO2: 98% 98%     General: Awake, no distress.  Patient is currently lying on stretcher with a battery-operated toothbrush in her mouth. CV:  Good peripheral perfusion.  Resp:  Normal effort.  Abd:  No distention.  Other:  Moderate dental decay lower right posterior molars x2 with edema surrounding this area.  Markedly tender.  There is a small area to the left lower premolar area without obvious decay or active drainage.   ED Results / Procedures / Treatments   Labs (all labs ordered are listed, but only abnormal results are displayed) Labs Reviewed - No data to display    PROCEDURES:  Critical Care performed:   Procedures   MEDICATIONS ORDERED IN ED: Medications   cefTRIAXone (ROCEPHIN) injection 1 g (1 g Intramuscular Given 10/21/21 1058)  lidocaine (PF) (XYLOCAINE) 1 % injection 5 mL (5 mLs Infiltration Given 10/21/21 1058)  ibuprofen (ADVIL) tablet 600 mg (600 mg Oral Given 10/21/21 1109)     IMPRESSION / MDM / ASSESSMENT AND PLAN / ED COURSE  I reviewed the triage vital signs and the nursing notes.   Differential diagnosis includes, but is not limited to, dental caries, dental abscess.  56 year old female presents to the ED with complaint of right lower dental pain and also to the left lower molar area.  Patient has a battery-operated toothbrush in her mouth that she keeps and constantly and is also screaming stating that it is causing more pain.  Patient was recently discharged from the hospital by Dr. Weber Cooks for polysubstance abuse and was suicidal ideation.  At discharge patient was placed on amoxicillin 500 mg which she states she has taken.  Patient does have some teeth are in very poor repair especially on the right lower posterior molar area which is markedly tender.  No abscess noted.  Patient was given an injection of Rocephin 1 g IM while in the ED and clindamycin was sent to the pharmacy.  Patient is supposed to be going to [facility] and arrangements apparently have been made for her to see a dentist.  Ibuprofen was also prescribed for pain and patient is aware that this will be the only medication that she will be prescribed.      Patient's presentation is most consistent with acute, uncomplicated illness.  FINAL CLINICAL IMPRESSION(S) / ED DIAGNOSES   Final diagnoses:  Pain due to dental caries     Rx / DC Orders   ED Discharge Orders          Ordered    clindamycin (CLEOCIN) 300 MG capsule  3 times daily        10/21/21 1051    ibuprofen (ADVIL) 800 MG tablet  Every 8 hours PRN        10/21/21 1051             Note:  This document was prepared using Dragon voice recognition software and may include unintentional  dictation errors.   Johnn Hai, PA-C 10/21/21 1203    Blake Divine, MD 10/21/21 [phone]

## 2021-12-14 ENCOUNTER — Emergency Department: Payer: Self-pay

## 2021-12-14 ENCOUNTER — Other Ambulatory Visit: Payer: Self-pay

## 2021-12-14 ENCOUNTER — Emergency Department
Admission: EM | Admit: 2021-12-14 | Discharge: 2021-12-14 | Disposition: A | Discharge status: Home / Self Care | Payer: Self-pay | Attending: Emergency Medicine | Admitting: Emergency Medicine

## 2021-12-14 DIAGNOSIS — E1165 Type 2 diabetes mellitus with hyperglycemia: Secondary | ICD-10-CM | POA: Insufficient documentation

## 2021-12-14 DIAGNOSIS — R531 Weakness: Secondary | ICD-10-CM | POA: Insufficient documentation

## 2021-12-14 DIAGNOSIS — R739 Hyperglycemia, unspecified: Secondary | ICD-10-CM

## 2021-12-14 DIAGNOSIS — E860 Dehydration: Secondary | ICD-10-CM | POA: Insufficient documentation

## 2021-12-14 LAB — CBC WITH DIFFERENTIAL/PLATELET
Abs Immature Granulocytes: 0.05 10*3/uL (ref 0.00–0.07)
Basophils Absolute: 0 10*3/uL (ref 0.0–0.1)
Basophils Relative: 0 %
Eosinophils Absolute: 0.2 10*3/uL (ref 0.0–0.5)
Eosinophils Relative: 2 %
HCT: 37.8 % (ref 36.0–46.0)
Hemoglobin: 12.9 g/dL (ref 12.0–15.0)
Immature Granulocytes: 0 %
Lymphocytes Relative: 22 %
Lymphs Abs: 2.7 10*3/uL (ref 0.7–4.0)
MCH: 28.6 pg (ref 26.0–34.0)
MCHC: 34.1 g/dL (ref 30.0–36.0)
MCV: 83.8 fL (ref 80.0–100.0)
Monocytes Absolute: 0.9 10*3/uL (ref 0.1–1.0)
Monocytes Relative: 7 %
Neutro Abs: 8.6 10*3/uL — ABNORMAL HIGH (ref 1.7–7.7)
Neutrophils Relative %: 69 %
Platelets: 277 10*3/uL (ref 150–400)
RBC: 4.51 MIL/uL (ref 3.87–5.11)
RDW: 12.7 % (ref 11.5–15.5)
WBC: 12.3 10*3/uL — ABNORMAL HIGH (ref 4.0–10.5)
nRBC: 0 % (ref 0.0–0.2)

## 2021-12-14 LAB — COMPREHENSIVE METABOLIC PANEL
ALT: 17 U/L (ref 0–44)
AST: 18 U/L (ref 15–41)
Albumin: 3.8 g/dL (ref 3.5–5.0)
Alkaline Phosphatase: 160 U/L — ABNORMAL HIGH (ref 38–126)
Anion gap: 11 (ref 5–15)
BUN: 19 mg/dL (ref 6–20)
CO2: 27 mmol/L (ref 22–32)
Calcium: 9.3 mg/dL (ref 8.9–10.3)
Chloride: 99 mmol/L (ref 98–111)
Creatinine, Ser: 0.84 mg/dL (ref 0.44–1.00)
GFR, Estimated: >60 mL/min (ref >60)
Glucose, Bld: 406 mg/dL — ABNORMAL HIGH (ref 70–99)
Potassium: 4 mmol/L (ref 3.5–5.1)
Sodium: 137 mmol/L (ref 135–145)
Total Bilirubin: 0.6 mg/dL (ref 0.3–1.2)
Total Protein: 7.7 g/dL (ref 6.5–8.1)

## 2021-12-14 LAB — URINALYSIS, ROUTINE W REFLEX MICROSCOPIC
Bacteria, UA: NONE SEEN
Bilirubin Urine: NEGATIVE
Glucose, UA: >=500 mg/dL — AB
Hgb urine dipstick: NEGATIVE
Ketones, ur: NEGATIVE mg/dL
Leukocytes,Ua: NEGATIVE
Nitrite: NEGATIVE
Protein, ur: NEGATIVE mg/dL
Specific Gravity, Urine: 1.028 (ref 1.005–1.030)
pH: 6 (ref 5.0–8.0)

## 2021-12-14 LAB — CBG MONITORING, ED
Glucose-Capillary: 447 mg/dL — ABNORMAL HIGH (ref 70–99)
Glucose-Capillary: 330 mg/dL — ABNORMAL HIGH (ref 70–99)

## 2021-12-14 LAB — BETA-HYDROXYBUTYRIC ACID: Beta-Hydroxybutyric Acid: 0.16 mmol/L (ref 0.05–0.27)

## 2021-12-14 MED ORDER — INSULIN ASPART 100 UNIT/ML IJ SOLN
8.0000 [IU] | Freq: Once | INTRAMUSCULAR | Status: AC
  Administered 2021-12-14: 8 [IU] via SUBCUTANEOUS
  Filled 2021-12-14: qty 1

## 2021-12-14 MED ORDER — SODIUM CHLORIDE 0.9 % IV BOLUS
1000.0000 mL | Freq: Once | INTRAVENOUS | Status: AC
  Administered 2021-12-14: 1000 mL via INTRAVENOUS

## 2021-12-14 NOTE — ED Triage Notes (Signed)
Pt started feeling dizzy, and having elevated glucose levels. Pt reports glucose levels in 300-450 range. Pt has been current with meds. Pt stated she is under treatment at [facility].

## 2021-12-14 NOTE — ED Provider Notes (Signed)
[facility] Provider Note    Event Date/Time   First MD Initiated Contact with Patient 12/14/21 1427     (approximate)   History   Hyperglycemia   HPI  Cadience L. Koci is a 56 y.o. female with a history of diabetes, substance abuse who presents with weakness and hyperglycemia.  Patient reports she is in detox at [facility] for crack cocaine and alcohol, last used 6 days ago.  She reports this morning she felt weak and unsteady, they checked her sugar and found to be over 400.  No abdominal pain, no cough no fevers.  Frequent urination, positive dry mouth     Physical Exam   Triage Vital Signs: ED Triage Vitals  Enc Vitals Group     BP 12/14/21 1411 127/84     Pulse Rate 12/14/21 1411 (!) 101     Resp 12/14/21 1411 20     Temp 12/14/21 1411 97.8 F (36.6 C)     Temp Source 12/14/21 1411 Oral     SpO2 12/14/21 1411 97 %     Weight 12/14/21 1408 81.6 kg (180 lb)     Height 12/14/21 1408 1.702 m ('5\' 7"'$ )     Head Circumference --      Peak Flow --      Pain Score 12/14/21 1408 0     Pain Loc --      Pain Edu? --      Excl. in [place]? --     Most recent vital signs: Vitals:   12/14/21 1630 12/14/21 1805  BP: 130/84 112/79  Pulse: 95 90  Resp:  16  Temp:    SpO2: 100% 100%     General: Awake, no distress.  CV:  Good peripheral perfusion.  Mild tachycardia Resp:  Normal effort.  Abd:  No distention.  Soft, reassuring exam  other:     ED Results / Procedures / Treatments   Labs (all labs ordered are listed, but only abnormal results are displayed) Labs Reviewed  COMPREHENSIVE METABOLIC PANEL - Abnormal; Notable for the following components:      Result Value   Glucose, Bld 406 (*)    Alkaline Phosphatase 160 (*)    All other components within normal limits  CBC WITH DIFFERENTIAL/PLATELET - Abnormal; Notable for the following components:   WBC 12.3 (*)    Neutro Abs 8.6 (*)    All other components within normal limits  URINALYSIS,  ROUTINE W REFLEX MICROSCOPIC - Abnormal; Notable for the following components:   Color, Urine YELLOW (*)    APPearance CLEAR (*)    Glucose, UA >=500 (*)    All other components within normal limits  CBG MONITORING, ED - Abnormal; Notable for the following components:   Glucose-Capillary 447 (*)    All other components within normal limits  CBG MONITORING, ED - Abnormal; Notable for the following components:   Glucose-Capillary 330 (*)    All other components within normal limits  BETA-HYDROXYBUTYRIC ACID     EKG  ED ECG REPORT I, Lavonia Drafts, the attending physician, personally viewed and interpreted this ECG.  Date: 12/14/2021  Rhythm: normal sinus rhythm QRS Axis: normal Intervals: normal ST/T Wave abnormalities: normal Narrative Interpretation: no evidence of acute ischemia    RADIOLOGY     PROCEDURES:  Critical Care performed:   Procedures   MEDICATIONS ORDERED IN ED: Medications  sodium chloride 0.9 % bolus 1,000 mL (0 mLs Intravenous Stopped 12/14/21 1723)  insulin aspart (  novoLOG) injection 8 Units (8 Units Subcutaneous Given 12/14/21 1625)     IMPRESSION / MDM / ASSESSMENT AND PLAN / ED COURSE  I reviewed the triage vital signs and the nursing notes. Patient's presentation is most consistent with acute presentation with potential threat to life or bodily function.  Patient presents with generalized weakness as detailed above in the setting of hyperglycemia.  Differential includes hyperglycemia related dehydration, DKA, alcohol withdrawal, electrolyte abnormalities.  We will start IV fluids, pending labs  BMP is reassuring, normal anion gap, glucose is over 400.  Patient does not have hypertension nor significant tachycardia to suggest withdrawal, no tremors  I suspect she is quite dehydrated, will give IV fluids and reevaluate  Patient feeling better after IV fluids, glucose has improved.  Possibility of Librium causing her generalized  weakness as well, no indication for admission, appropriate for discharge at this time, patient agrees with this plan.     FINAL CLINICAL IMPRESSION(S) / ED DIAGNOSES   Final diagnoses:  Hyperglycemia  Dehydration     Rx / DC Orders   ED Discharge Orders     None        Note:  This document was prepared using Dragon voice recognition software and may include unintentional dictation errors.   Lavonia Drafts, MD 12/14/21 [phone]

## 2021-12-14 NOTE — ED Triage Notes (Signed)
First Nurse Note;  Pt via EMS from [place]. Pt has  hx of Type I DM, pt has been feeling weak. Report CBG of 441. 20 G in L forearm and EMS report CBG of 399. Pt is A&Ox4 and NAD  98% on RA 98 HR  115/77 BP.

## 2021-12-14 NOTE — ED Notes (Signed)
Patient CBG was 330. 

## 2021-12-14 NOTE — ED Notes (Addendum)
E-signature pad not working. Patient was given phone to call [facility] for transport.

## 2021-12-14 NOTE — ED Notes (Signed)
Urine was collected and sent.

## 2021-12-14 NOTE — ED Notes (Signed)
Patient was assisted to hallway bathroom via wheelchair. Patient was able to transfer self from wheelchair to commode.

## 2021-12-14 NOTE — ED Notes (Signed)
Patient was unsteady when being ambulatory and Dr. Corky Downs was aware and present at that time.

## 2021-12-18 ENCOUNTER — Other Ambulatory Visit: Payer: Self-pay

## 2021-12-18 ENCOUNTER — Emergency Department
Admission: EM | Admit: 2021-12-18 | Discharge: 2021-12-18 | Discharge status: Against Medical Advice | Payer: Self-pay | Attending: Emergency Medicine | Admitting: Emergency Medicine

## 2021-12-18 DIAGNOSIS — Z5321 Procedure and treatment not carried out due to patient leaving prior to being seen by health care provider: Secondary | ICD-10-CM | POA: Insufficient documentation

## 2021-12-18 DIAGNOSIS — M79606 Pain in leg, unspecified: Secondary | ICD-10-CM | POA: Insufficient documentation

## 2021-12-18 LAB — CBC
HCT: 38 % (ref 36.0–46.0)
Hemoglobin: 12.8 g/dL (ref 12.0–15.0)
MCH: 27.9 pg (ref 26.0–34.0)
MCHC: 33.7 g/dL (ref 30.0–36.0)
MCV: 82.8 fL (ref 80.0–100.0)
Platelets: 267 10*3/uL (ref 150–400)
RBC: 4.59 MIL/uL (ref 3.87–5.11)
RDW: 12.7 % (ref 11.5–15.5)
WBC: 13.4 10*3/uL — ABNORMAL HIGH (ref 4.0–10.5)
nRBC: 0 % (ref 0.0–0.2)

## 2021-12-18 LAB — COMPREHENSIVE METABOLIC PANEL
ALT: 19 U/L (ref 0–44)
AST: 16 U/L (ref 15–41)
Albumin: 3.8 g/dL (ref 3.5–5.0)
Alkaline Phosphatase: 130 U/L — ABNORMAL HIGH (ref 38–126)
Anion gap: 14 (ref 5–15)
BUN: 12 mg/dL (ref 6–20)
CO2: 17 mmol/L — ABNORMAL LOW (ref 22–32)
Calcium: 9.5 mg/dL (ref 8.9–10.3)
Chloride: 104 mmol/L (ref 98–111)
Creatinine, Ser: 0.55 mg/dL (ref 0.44–1.00)
GFR, Estimated: >60 mL/min (ref >60)
Glucose, Bld: 366 mg/dL — ABNORMAL HIGH (ref 70–99)
Potassium: 3.4 mmol/L — ABNORMAL LOW (ref 3.5–5.1)
Sodium: 135 mmol/L (ref 135–145)
Total Bilirubin: 0.5 mg/dL (ref 0.3–1.2)
Total Protein: 7.9 g/dL (ref 6.5–8.1)

## 2021-12-18 NOTE — ED Notes (Signed)
No answer when called several times from lobby 

## 2021-12-18 NOTE — ED Triage Notes (Signed)
Pt states she was discharged from Monday from [facility].  Relapsed today.  Continued leg pain and "feels so bad"

## 2021-12-18 NOTE — ED Notes (Signed)
First Nurse Note: Pt to ED via [facility] from the side of the road. Pt reported severe leg pain since Thursday. Pt was just released from [facility] on Monday. Pt states that she did use crack today and admits to drinking. Pt states that her leg pain started in rehab.  Pt also states that she is having shortness of breath and extreme fatigue that started when she started taking the librium. Pt would like to go to a secondary rehab facility.   Pt has hx/o DM, HTN  CBG 326 HR 109 SpO2: 97% on room air BP: 115/65

## 2022-03-17 DIAGNOSIS — E119 Type 2 diabetes mellitus without complications: Secondary | ICD-10-CM | POA: Diagnosis not present

## 2022-03-17 DIAGNOSIS — F1490 Cocaine use, unspecified, uncomplicated: Secondary | ICD-10-CM | POA: Diagnosis not present

## 2022-03-17 DIAGNOSIS — M25561 Pain in right knee: Secondary | ICD-10-CM | POA: Diagnosis not present

## 2022-03-17 DIAGNOSIS — F3181 Bipolar II disorder: Secondary | ICD-10-CM | POA: Diagnosis not present

## 2022-03-17 DIAGNOSIS — M810 Age-related osteoporosis without current pathological fracture: Secondary | ICD-10-CM | POA: Diagnosis not present

## 2022-03-17 DIAGNOSIS — M1711 Unilateral primary osteoarthritis, right knee: Secondary | ICD-10-CM | POA: Diagnosis not present

## 2022-03-17 DIAGNOSIS — R69 Illness, unspecified: Secondary | ICD-10-CM | POA: Diagnosis not present

## 2022-03-17 DIAGNOSIS — F17210 Nicotine dependence, cigarettes, uncomplicated: Secondary | ICD-10-CM | POA: Diagnosis not present

## 2022-03-17 DIAGNOSIS — Z794 Long term (current) use of insulin: Secondary | ICD-10-CM | POA: Diagnosis not present

## 2022-03-30 DIAGNOSIS — R0989 Other specified symptoms and signs involving the circulatory and respiratory systems: Secondary | ICD-10-CM | POA: Diagnosis not present

## 2022-03-30 DIAGNOSIS — M549 Dorsalgia, unspecified: Secondary | ICD-10-CM | POA: Diagnosis not present

## 2022-03-30 DIAGNOSIS — R0789 Other chest pain: Secondary | ICD-10-CM | POA: Diagnosis not present

## 2022-03-30 DIAGNOSIS — R079 Chest pain, unspecified: Secondary | ICD-10-CM | POA: Diagnosis not present

## 2022-03-30 DIAGNOSIS — M79602 Pain in left arm: Secondary | ICD-10-CM | POA: Diagnosis not present

## 2022-03-31 DIAGNOSIS — R079 Chest pain, unspecified: Secondary | ICD-10-CM | POA: Diagnosis not present

## 2022-04-04 DIAGNOSIS — R059 Cough, unspecified: Secondary | ICD-10-CM | POA: Diagnosis not present

## 2022-04-04 DIAGNOSIS — B349 Viral infection, unspecified: Secondary | ICD-10-CM | POA: Diagnosis not present

## 2022-04-04 DIAGNOSIS — Z794 Long term (current) use of insulin: Secondary | ICD-10-CM | POA: Diagnosis not present

## 2022-04-04 DIAGNOSIS — Z79899 Other long term (current) drug therapy: Secondary | ICD-10-CM | POA: Diagnosis not present

## 2022-04-04 DIAGNOSIS — E119 Type 2 diabetes mellitus without complications: Secondary | ICD-10-CM | POA: Diagnosis not present

## 2022-04-04 DIAGNOSIS — F3181 Bipolar II disorder: Secondary | ICD-10-CM | POA: Diagnosis not present

## 2022-04-04 DIAGNOSIS — F17210 Nicotine dependence, cigarettes, uncomplicated: Secondary | ICD-10-CM | POA: Diagnosis not present

## 2022-04-04 DIAGNOSIS — R5383 Other fatigue: Secondary | ICD-10-CM | POA: Diagnosis not present

## 2022-04-04 DIAGNOSIS — R110 Nausea: Secondary | ICD-10-CM | POA: Diagnosis not present

## 2022-04-04 DIAGNOSIS — R61 Generalized hyperhidrosis: Secondary | ICD-10-CM | POA: Diagnosis not present

## 2022-04-04 DIAGNOSIS — Z20822 Contact with and (suspected) exposure to covid-19: Secondary | ICD-10-CM | POA: Diagnosis not present

## 2022-04-04 DIAGNOSIS — R0981 Nasal congestion: Secondary | ICD-10-CM | POA: Diagnosis not present

## 2022-04-04 DIAGNOSIS — Z7984 Long term (current) use of oral hypoglycemic drugs: Secondary | ICD-10-CM | POA: Diagnosis not present

## 2022-04-04 DIAGNOSIS — Z91041 Radiographic dye allergy status: Secondary | ICD-10-CM | POA: Diagnosis not present

## 2022-04-04 DIAGNOSIS — R6889 Other general symptoms and signs: Secondary | ICD-10-CM | POA: Diagnosis not present

## 2022-04-12 DIAGNOSIS — R519 Headache, unspecified: Secondary | ICD-10-CM | POA: Diagnosis not present

## 2022-04-12 DIAGNOSIS — R5383 Other fatigue: Secondary | ICD-10-CM | POA: Diagnosis not present

## 2022-04-12 DIAGNOSIS — E119 Type 2 diabetes mellitus without complications: Secondary | ICD-10-CM | POA: Diagnosis not present

## 2022-04-12 DIAGNOSIS — R42 Dizziness and giddiness: Secondary | ICD-10-CM | POA: Diagnosis not present

## 2022-04-12 DIAGNOSIS — Z20822 Contact with and (suspected) exposure to covid-19: Secondary | ICD-10-CM | POA: Diagnosis not present

## 2022-04-12 DIAGNOSIS — J069 Acute upper respiratory infection, unspecified: Secondary | ICD-10-CM | POA: Diagnosis not present

## 2022-04-12 DIAGNOSIS — F17210 Nicotine dependence, cigarettes, uncomplicated: Secondary | ICD-10-CM | POA: Diagnosis not present

## 2022-05-05 DIAGNOSIS — E119 Type 2 diabetes mellitus without complications: Secondary | ICD-10-CM | POA: Diagnosis not present

## 2022-05-05 DIAGNOSIS — F3181 Bipolar II disorder: Secondary | ICD-10-CM | POA: Diagnosis not present

## 2022-05-05 DIAGNOSIS — M549 Dorsalgia, unspecified: Secondary | ICD-10-CM | POA: Diagnosis not present

## 2022-05-05 DIAGNOSIS — R0602 Shortness of breath: Secondary | ICD-10-CM | POA: Diagnosis not present

## 2022-05-05 DIAGNOSIS — R079 Chest pain, unspecified: Secondary | ICD-10-CM | POA: Diagnosis not present

## 2022-05-05 DIAGNOSIS — Z7984 Long term (current) use of oral hypoglycemic drugs: Secondary | ICD-10-CM | POA: Diagnosis not present

## 2022-05-05 DIAGNOSIS — F17210 Nicotine dependence, cigarettes, uncomplicated: Secondary | ICD-10-CM | POA: Diagnosis not present

## 2022-05-05 DIAGNOSIS — Z20822 Contact with and (suspected) exposure to covid-19: Secondary | ICD-10-CM | POA: Diagnosis not present

## 2022-05-05 DIAGNOSIS — R0789 Other chest pain: Secondary | ICD-10-CM | POA: Diagnosis not present

## 2022-05-05 DIAGNOSIS — M25512 Pain in left shoulder: Secondary | ICD-10-CM | POA: Diagnosis not present

## 2022-05-05 DIAGNOSIS — Z1152 Encounter for screening for COVID-19: Secondary | ICD-10-CM | POA: Diagnosis not present

## 2022-05-05 DIAGNOSIS — G8929 Other chronic pain: Secondary | ICD-10-CM | POA: Diagnosis not present

## 2022-05-05 DIAGNOSIS — I444 Left anterior fascicular block: Secondary | ICD-10-CM | POA: Diagnosis not present

## 2022-05-05 DIAGNOSIS — R9431 Abnormal electrocardiogram [ECG] [EKG]: Secondary | ICD-10-CM | POA: Diagnosis not present

## 2022-05-23 DIAGNOSIS — R079 Chest pain, unspecified: Secondary | ICD-10-CM | POA: Diagnosis not present

## 2022-05-23 DIAGNOSIS — M791 Myalgia, unspecified site: Secondary | ICD-10-CM | POA: Diagnosis not present

## 2022-05-23 DIAGNOSIS — M7918 Myalgia, other site: Secondary | ICD-10-CM | POA: Diagnosis not present

## 2022-05-23 DIAGNOSIS — F3181 Bipolar II disorder: Secondary | ICD-10-CM | POA: Diagnosis not present

## 2022-05-23 DIAGNOSIS — I771 Stricture of artery: Secondary | ICD-10-CM | POA: Diagnosis not present

## 2022-05-23 DIAGNOSIS — M546 Pain in thoracic spine: Secondary | ICD-10-CM | POA: Diagnosis not present

## 2022-05-23 DIAGNOSIS — E119 Type 2 diabetes mellitus without complications: Secondary | ICD-10-CM | POA: Diagnosis not present

## 2022-05-23 DIAGNOSIS — M25511 Pain in right shoulder: Secondary | ICD-10-CM | POA: Diagnosis not present

## 2022-05-23 DIAGNOSIS — G479 Sleep disorder, unspecified: Secondary | ICD-10-CM | POA: Diagnosis not present

## 2022-08-02 DIAGNOSIS — Z79899 Other long term (current) drug therapy: Secondary | ICD-10-CM | POA: Diagnosis not present

## 2022-08-02 DIAGNOSIS — G43909 Migraine, unspecified, not intractable, without status migrainosus: Secondary | ICD-10-CM | POA: Diagnosis not present

## 2022-08-02 DIAGNOSIS — R200 Anesthesia of skin: Secondary | ICD-10-CM | POA: Diagnosis not present

## 2022-08-02 DIAGNOSIS — F17210 Nicotine dependence, cigarettes, uncomplicated: Secondary | ICD-10-CM | POA: Diagnosis not present

## 2022-08-02 DIAGNOSIS — R110 Nausea: Secondary | ICD-10-CM | POA: Diagnosis not present

## 2022-08-02 DIAGNOSIS — H538 Other visual disturbances: Secondary | ICD-10-CM | POA: Diagnosis not present

## 2022-08-02 DIAGNOSIS — F3181 Bipolar II disorder: Secondary | ICD-10-CM | POA: Diagnosis not present

## 2022-08-02 DIAGNOSIS — R42 Dizziness and giddiness: Secondary | ICD-10-CM | POA: Diagnosis not present

## 2022-08-02 DIAGNOSIS — F1410 Cocaine abuse, uncomplicated: Secondary | ICD-10-CM | POA: Diagnosis not present

## 2022-08-02 DIAGNOSIS — H3530 Unspecified macular degeneration: Secondary | ICD-10-CM | POA: Diagnosis not present

## 2022-08-02 DIAGNOSIS — R4781 Slurred speech: Secondary | ICD-10-CM | POA: Diagnosis not present

## 2022-08-02 DIAGNOSIS — Z91041 Radiographic dye allergy status: Secondary | ICD-10-CM | POA: Diagnosis not present

## 2022-08-02 DIAGNOSIS — Z794 Long term (current) use of insulin: Secondary | ICD-10-CM | POA: Diagnosis not present

## 2022-08-02 DIAGNOSIS — Z7984 Long term (current) use of oral hypoglycemic drugs: Secondary | ICD-10-CM | POA: Diagnosis not present

## 2022-08-02 DIAGNOSIS — Z1152 Encounter for screening for COVID-19: Secondary | ICD-10-CM | POA: Diagnosis not present

## 2022-08-02 DIAGNOSIS — E119 Type 2 diabetes mellitus without complications: Secondary | ICD-10-CM | POA: Diagnosis not present

## 2022-08-02 DIAGNOSIS — I639 Cerebral infarction, unspecified: Secondary | ICD-10-CM | POA: Diagnosis not present

## 2022-08-25 DIAGNOSIS — F3181 Bipolar II disorder: Secondary | ICD-10-CM | POA: Diagnosis not present

## 2022-08-25 DIAGNOSIS — Z7984 Long term (current) use of oral hypoglycemic drugs: Secondary | ICD-10-CM | POA: Diagnosis not present

## 2022-08-25 DIAGNOSIS — R9431 Abnormal electrocardiogram [ECG] [EKG]: Secondary | ICD-10-CM | POA: Diagnosis not present

## 2022-08-25 DIAGNOSIS — F17210 Nicotine dependence, cigarettes, uncomplicated: Secondary | ICD-10-CM | POA: Diagnosis not present

## 2022-08-25 DIAGNOSIS — R110 Nausea: Secondary | ICD-10-CM | POA: Diagnosis not present

## 2022-08-25 DIAGNOSIS — Z794 Long term (current) use of insulin: Secondary | ICD-10-CM | POA: Diagnosis not present

## 2022-08-25 DIAGNOSIS — I444 Left anterior fascicular block: Secondary | ICD-10-CM | POA: Diagnosis not present

## 2022-08-25 DIAGNOSIS — H8110 Benign paroxysmal vertigo, unspecified ear: Secondary | ICD-10-CM | POA: Diagnosis not present

## 2022-08-25 DIAGNOSIS — R42 Dizziness and giddiness: Secondary | ICD-10-CM | POA: Diagnosis not present

## 2022-08-25 DIAGNOSIS — E119 Type 2 diabetes mellitus without complications: Secondary | ICD-10-CM | POA: Diagnosis not present

## 2022-08-26 DIAGNOSIS — H8110 Benign paroxysmal vertigo, unspecified ear: Secondary | ICD-10-CM | POA: Diagnosis not present

## 2022-10-01 DIAGNOSIS — M79602 Pain in left arm: Secondary | ICD-10-CM | POA: Diagnosis not present

## 2022-10-01 DIAGNOSIS — R1031 Right lower quadrant pain: Secondary | ICD-10-CM | POA: Diagnosis not present

## 2022-10-01 DIAGNOSIS — M79601 Pain in right arm: Secondary | ICD-10-CM | POA: Diagnosis not present

## 2022-10-01 DIAGNOSIS — Z1152 Encounter for screening for COVID-19: Secondary | ICD-10-CM | POA: Diagnosis not present

## 2022-10-01 DIAGNOSIS — F3181 Bipolar II disorder: Secondary | ICD-10-CM | POA: Diagnosis not present

## 2022-10-01 DIAGNOSIS — F17210 Nicotine dependence, cigarettes, uncomplicated: Secondary | ICD-10-CM | POA: Diagnosis not present

## 2022-10-01 DIAGNOSIS — E119 Type 2 diabetes mellitus without complications: Secondary | ICD-10-CM | POA: Diagnosis not present

## 2022-10-01 DIAGNOSIS — J029 Acute pharyngitis, unspecified: Secondary | ICD-10-CM | POA: Diagnosis not present

## 2022-10-02 DIAGNOSIS — R1031 Right lower quadrant pain: Secondary | ICD-10-CM | POA: Diagnosis not present

## 2022-10-02 DIAGNOSIS — R9431 Abnormal electrocardiogram [ECG] [EKG]: Secondary | ICD-10-CM | POA: Diagnosis not present

## 2022-10-02 DIAGNOSIS — I444 Left anterior fascicular block: Secondary | ICD-10-CM | POA: Diagnosis not present

## 2022-10-03 DIAGNOSIS — D1803 Hemangioma of intra-abdominal structures: Secondary | ICD-10-CM | POA: Diagnosis not present

## 2022-10-03 DIAGNOSIS — H8110 Benign paroxysmal vertigo, unspecified ear: Secondary | ICD-10-CM | POA: Diagnosis not present

## 2022-10-03 DIAGNOSIS — Z712 Person consulting for explanation of examination or test findings: Secondary | ICD-10-CM | POA: Diagnosis not present

## 2022-11-13 DIAGNOSIS — F4320 Adjustment disorder, unspecified: Secondary | ICD-10-CM | POA: Diagnosis not present

## 2022-11-13 DIAGNOSIS — F3132 Bipolar disorder, current episode depressed, moderate: Secondary | ICD-10-CM | POA: Diagnosis not present

## 2023-11-12 NOTE — ED Provider Notes (Addendum)
 7:20 AM  Assumed care of patient from off-going team. For more details, please see note from same day. HPI below from primary note:   58 y.o. female with past medical history of type 2 diabetes complicated by gastroparesis, diverticulitis, bipolar disorder who presents to the ED for evaluation of nausea, vomiting, and diarrhea since 10 AM on 10/1.  She has been unable to tolerate p.o intake due to persistent nausea.  She also notes migrating abdominal pain that is sometimes in the epigastrium, sometimes in the right upper quadrant, and sometimes in the left lower quadrant.  Denies flank pain, dysuria, increased urinary frequency, but has noticed that her urine is much darker than usual.  She denies headache, dizziness, loss of consciousness, cough, congestion, sore throat, fevers, chills, chest pain, shortness of breath.  No sick contacts or recent travel.  No recent dietary changes.  She states that this feels different than prior episodes of gastroparesis.   ED Course/Additional Details:  ED Course as of 11/12/23 1245  Thu Nov 12, 2023  1135 CT abdomen pelvis with contrast Impression: 1.  Moderate to large colonic stool burden with steatorrhea which can be a source of abdominal pain. 2.  Otherwise no acute findings in the abdomen and pelvis. [AB]  1210 Nitrite, UA: Negative [AB]  1210 Leukocytes, UA: Negative [AB]  1220 CT imaging with large colonic stool burden [AB]  1245 Tolerated p.o. challenge. [AB]    ED Course User Index [AB] Patterson Darin Booker, PA    Follow-up at [facility]:  - Follow-up imaging: CT abdomen pelvis, Chest x-ray - Follow-up urinalysis - Follow-up flu testing   Patterson Darin Booker, PA 11/12/23 1244    Patterson Darin Booker, [place] 11/12/23 1245

## 2024-02-03 ENCOUNTER — Other Ambulatory Visit: Payer: Self-pay

## 2024-02-03 ENCOUNTER — Emergency Department: Payer: Self-pay

## 2024-02-03 ENCOUNTER — Emergency Department
Admission: EM | Admit: 2024-02-03 | Discharge: 2024-02-03 | Disposition: A | Discharge status: Home / Self Care | Payer: Self-pay | Attending: Emergency Medicine | Admitting: Emergency Medicine

## 2024-02-03 DIAGNOSIS — J069 Acute upper respiratory infection, unspecified: Secondary | ICD-10-CM | POA: Insufficient documentation

## 2024-02-03 DIAGNOSIS — E119 Type 2 diabetes mellitus without complications: Secondary | ICD-10-CM | POA: Insufficient documentation

## 2024-02-03 LAB — RESP PANEL BY RT-PCR (RSV, FLU A&B, COVID)  RVPGX2
Influenza A by PCR: NEGATIVE
Influenza B by PCR: NEGATIVE
Resp Syncytial Virus by PCR: NEGATIVE
SARS Coronavirus 2 by RT PCR: NEGATIVE

## 2024-02-03 MED ORDER — ACETAMINOPHEN 325 MG PO TABS
650.0000 mg | ORAL_TABLET | Freq: Once | ORAL | Status: AC
  Administered 2024-02-03: 650 mg via ORAL
  Filled 2024-02-03: qty 2

## 2024-02-03 MED ORDER — BENZONATATE 100 MG PO CAPS: 100.0000 mg | ORAL_CAPSULE | Freq: Two times a day (BID) | ORAL | 0 refills | Status: AC | PRN

## 2024-02-03 NOTE — ED Triage Notes (Signed)
 C/O cough and right back pain with cough. Onset of symptoms MOnday. Grandchildren have flu.  AAOx3. Skin warm and dry .NAD
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
## 2024-02-03 NOTE — Discharge Instructions (Signed)
 Your chest x-ray did not reveal evidence of pneumonia.  You may take the cough medicine to help with your symptoms.  You may also continue to take Tylenol  650 mg every 6-8 hours.  Please return for any new, worsening, or changing symptoms or other concerns.  It was a pleasure caring for you today.
